# Patient Record
Sex: Male | Born: 1935
Health system: Southern US, Community
[De-identification: ages and names within clinical notes are randomized; demographics above are authoritative.]

## PROBLEM LIST (undated history)

## (undated) DIAGNOSIS — M545 Low back pain, unspecified: Secondary | ICD-10-CM

## (undated) DIAGNOSIS — M48061 Spinal stenosis, lumbar region without neurogenic claudication: Secondary | ICD-10-CM

## (undated) DIAGNOSIS — M5126 Other intervertebral disc displacement, lumbar region: Secondary | ICD-10-CM

## (undated) DIAGNOSIS — K449 Diaphragmatic hernia without obstruction or gangrene: Secondary | ICD-10-CM

## (undated) DIAGNOSIS — C259 Malignant neoplasm of pancreas, unspecified: Secondary | ICD-10-CM

## (undated) DIAGNOSIS — D649 Anemia, unspecified: Secondary | ICD-10-CM

## (undated) DIAGNOSIS — Z86010 Personal history of colonic polyps: Secondary | ICD-10-CM

## (undated) DIAGNOSIS — R531 Weakness: Secondary | ICD-10-CM

## (undated) DIAGNOSIS — Z860101 Personal history of adenomatous and serrated colon polyps: Secondary | ICD-10-CM

## (undated) DIAGNOSIS — M1990 Unspecified osteoarthritis, unspecified site: Secondary | ICD-10-CM

## (undated) DIAGNOSIS — M51369 Other intervertebral disc degeneration, lumbar region without mention of lumbar back pain or lower extremity pain: Secondary | ICD-10-CM

## (undated) DIAGNOSIS — R600 Localized edema: Secondary | ICD-10-CM

## (undated) DIAGNOSIS — K219 Gastro-esophageal reflux disease without esophagitis: Secondary | ICD-10-CM

## (undated) DIAGNOSIS — C3490 Malignant neoplasm of unspecified part of unspecified bronchus or lung: Secondary | ICD-10-CM

## (undated) DIAGNOSIS — Z5111 Encounter for antineoplastic chemotherapy: Secondary | ICD-10-CM

## (undated) DIAGNOSIS — N401 Enlarged prostate with lower urinary tract symptoms: Secondary | ICD-10-CM

## (undated) DIAGNOSIS — M5136 Other intervertebral disc degeneration, lumbar region: Secondary | ICD-10-CM

## (undated) DIAGNOSIS — R7303 Prediabetes: Secondary | ICD-10-CM

## (undated) DIAGNOSIS — I10 Essential (primary) hypertension: Secondary | ICD-10-CM

## (undated) DIAGNOSIS — Z9189 Other specified personal risk factors, not elsewhere classified: Secondary | ICD-10-CM

## (undated) DIAGNOSIS — R5383 Other fatigue: Secondary | ICD-10-CM

## (undated) DIAGNOSIS — K5790 Diverticulosis of intestine, part unspecified, without perforation or abscess without bleeding: Secondary | ICD-10-CM

## (undated) DIAGNOSIS — Z974 Presence of external hearing-aid: Secondary | ICD-10-CM

## (undated) DIAGNOSIS — K5710 Diverticulosis of small intestine without perforation or abscess without bleeding: Secondary | ICD-10-CM

## (undated) DIAGNOSIS — G8929 Other chronic pain: Secondary | ICD-10-CM

## (undated) HISTORY — DX: Gastro-esophageal reflux disease without esophagitis: K21.9

## (undated) HISTORY — DX: Malignant neoplasm of unspecified part of unspecified bronchus or lung: C34.90

## (undated) HISTORY — PX: LAPAROSCOPIC CHOLECYSTECTOMY: SUR755

## (undated) HISTORY — DX: Diaphragmatic hernia without obstruction or gangrene: K44.9

## (undated) HISTORY — DX: Diverticulosis of intestine, part unspecified, without perforation or abscess without bleeding: K57.90

## (undated) HISTORY — DX: Diverticulosis of small intestine without perforation or abscess without bleeding: K57.10

## (undated) HISTORY — DX: Encounter for antineoplastic chemotherapy: Z51.11

## (undated) HISTORY — PX: SPLENECTOMY: SUR1306

## (undated) HISTORY — DX: Unspecified osteoarthritis, unspecified site: M19.90

## (undated) HISTORY — PX: INGUINAL HERNIA REPAIR: SUR1180

---

## 1986-08-14 HISTORY — PX: TRANSURETHRAL RESECTION OF PROSTATE: SHX73

## 2000-10-10 ENCOUNTER — Encounter (INDEPENDENT_AMBULATORY_CARE_PROVIDER_SITE_OTHER): Payer: Self-pay | Admitting: *Deleted

## 2000-10-10 ENCOUNTER — Encounter: Payer: Self-pay | Admitting: Emergency Medicine

## 2000-10-11 ENCOUNTER — Encounter: Payer: Self-pay | Admitting: Internal Medicine

## 2000-10-11 ENCOUNTER — Inpatient Hospital Stay (HOSPITAL_COMMUNITY): Admission: EM | Admit: 2000-10-11 | Discharge: 2000-10-17 | Payer: Self-pay | Admitting: Emergency Medicine

## 2002-08-14 HISTORY — PX: CATARACT EXTRACTION W/ INTRAOCULAR LENS  IMPLANT, BILATERAL: SHX1307

## 2003-04-27 ENCOUNTER — Encounter: Payer: Self-pay | Admitting: Internal Medicine

## 2003-04-27 ENCOUNTER — Ambulatory Visit (HOSPITAL_COMMUNITY): Admission: RE | Admit: 2003-04-27 | Discharge: 2003-04-27 | Payer: Self-pay | Admitting: Internal Medicine

## 2003-05-26 ENCOUNTER — Encounter: Payer: Self-pay | Admitting: Gastroenterology

## 2003-06-04 ENCOUNTER — Encounter: Payer: Self-pay | Admitting: Gastroenterology

## 2003-06-04 ENCOUNTER — Ambulatory Visit (HOSPITAL_COMMUNITY): Admission: RE | Admit: 2003-06-04 | Discharge: 2003-06-04 | Payer: Self-pay | Admitting: Gastroenterology

## 2003-06-09 ENCOUNTER — Ambulatory Visit (HOSPITAL_COMMUNITY): Admission: RE | Admit: 2003-06-09 | Discharge: 2003-06-09 | Payer: Self-pay | Admitting: Gastroenterology

## 2003-07-02 ENCOUNTER — Encounter (INDEPENDENT_AMBULATORY_CARE_PROVIDER_SITE_OTHER): Payer: Self-pay | Admitting: *Deleted

## 2003-07-02 ENCOUNTER — Encounter (INDEPENDENT_AMBULATORY_CARE_PROVIDER_SITE_OTHER): Payer: Self-pay | Admitting: Specialist

## 2003-07-02 ENCOUNTER — Inpatient Hospital Stay (HOSPITAL_COMMUNITY): Admission: RE | Admit: 2003-07-02 | Discharge: 2003-07-04 | Payer: Self-pay | Admitting: Surgery

## 2005-01-29 ENCOUNTER — Emergency Department (HOSPITAL_COMMUNITY): Admission: EM | Admit: 2005-01-29 | Discharge: 2005-01-29 | Payer: Self-pay | Admitting: Emergency Medicine

## 2005-05-26 ENCOUNTER — Ambulatory Visit: Payer: Self-pay | Admitting: Oncology

## 2005-08-13 ENCOUNTER — Emergency Department (HOSPITAL_COMMUNITY): Admission: EM | Admit: 2005-08-13 | Discharge: 2005-08-13 | Payer: Self-pay | Admitting: Emergency Medicine

## 2005-10-11 ENCOUNTER — Ambulatory Visit: Payer: Self-pay | Admitting: Oncology

## 2008-04-23 DIAGNOSIS — Q8909 Congenital malformations of spleen: Secondary | ICD-10-CM

## 2008-04-23 DIAGNOSIS — Z860100 Personal history of colon polyps, unspecified: Secondary | ICD-10-CM | POA: Insufficient documentation

## 2008-04-23 DIAGNOSIS — R350 Frequency of micturition: Secondary | ICD-10-CM

## 2008-04-23 DIAGNOSIS — M899 Disorder of bone, unspecified: Secondary | ICD-10-CM | POA: Insufficient documentation

## 2008-04-23 DIAGNOSIS — M47817 Spondylosis without myelopathy or radiculopathy, lumbosacral region: Secondary | ICD-10-CM | POA: Insufficient documentation

## 2008-04-23 DIAGNOSIS — D649 Anemia, unspecified: Secondary | ICD-10-CM | POA: Insufficient documentation

## 2008-04-23 DIAGNOSIS — M949 Disorder of cartilage, unspecified: Secondary | ICD-10-CM

## 2008-04-23 DIAGNOSIS — D473 Essential (hemorrhagic) thrombocythemia: Secondary | ICD-10-CM | POA: Insufficient documentation

## 2008-04-23 DIAGNOSIS — IMO0002 Reserved for concepts with insufficient information to code with codable children: Secondary | ICD-10-CM

## 2008-04-27 ENCOUNTER — Ambulatory Visit: Payer: Self-pay | Admitting: Gastroenterology

## 2008-04-27 DIAGNOSIS — K219 Gastro-esophageal reflux disease without esophagitis: Secondary | ICD-10-CM

## 2008-05-29 ENCOUNTER — Ambulatory Visit: Payer: Self-pay | Admitting: Gastroenterology

## 2008-06-01 ENCOUNTER — Telehealth: Payer: Self-pay | Admitting: Gastroenterology

## 2008-06-17 ENCOUNTER — Encounter: Payer: Self-pay | Admitting: Gastroenterology

## 2008-06-17 ENCOUNTER — Ambulatory Visit: Payer: Self-pay | Admitting: Gastroenterology

## 2008-06-18 ENCOUNTER — Encounter: Payer: Self-pay | Admitting: Gastroenterology

## 2008-06-23 ENCOUNTER — Telehealth: Payer: Self-pay | Admitting: Gastroenterology

## 2010-09-06 ENCOUNTER — Encounter: Payer: Self-pay | Admitting: Gastroenterology

## 2010-09-11 ENCOUNTER — Encounter: Payer: Self-pay | Admitting: Gastroenterology

## 2010-09-14 ENCOUNTER — Encounter: Payer: Self-pay | Admitting: Gastroenterology

## 2010-09-16 ENCOUNTER — Encounter: Payer: Self-pay | Admitting: Gastroenterology

## 2010-09-21 ENCOUNTER — Encounter: Payer: Self-pay | Admitting: Gastroenterology

## 2010-09-22 ENCOUNTER — Encounter: Payer: Self-pay | Admitting: Gastroenterology

## 2010-10-06 ENCOUNTER — Ambulatory Visit: Payer: Self-pay | Admitting: Gastroenterology

## 2010-10-13 ENCOUNTER — Encounter: Payer: Self-pay | Admitting: Gastroenterology

## 2010-10-13 ENCOUNTER — Ambulatory Visit (INDEPENDENT_AMBULATORY_CARE_PROVIDER_SITE_OTHER): Payer: Medicare Other | Admitting: Gastroenterology

## 2010-10-13 DIAGNOSIS — R197 Diarrhea, unspecified: Secondary | ICD-10-CM

## 2010-10-19 ENCOUNTER — Telehealth: Payer: Self-pay | Admitting: Gastroenterology

## 2010-10-20 NOTE — Assessment & Plan Note (Signed)
Summary: Persistant loose stools   History of Present Illness Visit Type: Follow-up Visit Primary GI MD: Elie Goody MD [facility] Primary Provider: Gregery Na Requesting Provider: Gregery Na Chief Complaint: Loose stools up to four times a day History of Present Illness:   Mr. Brandun has worsening diarrhea for a few weeks, WBC=23 and fevers and was hospitalized at [facility]. Records from PCP office and hospitalization reviewed. All stool studies were negative including fecal leukocytes. CT scan showed a pericardial effusion and a kidney stone. ESR=45. He was treated with a 7 day course of Levaquin and his diarrhea has returned to his baseline of 2-4 loose stools per day. He feels well. Colonoscopy in 06/2008 showed polyps, diverticulosis and hemorrhoids.    GI Review of Systems    Reports acid reflux.      Denies abdominal pain, belching, bloating, chest pain, dysphagia with liquids, dysphagia with solids, heartburn, loss of appetite, nausea, vomiting, vomiting blood, weight loss, and  weight gain.      Reports change in bowel habits and  diarrhea.     Denies anal fissure, black tarry stools, constipation, diverticulosis, fecal incontinence, heme positive stool, hemorrhoids, irritable bowel syndrome, jaundice, light color stool, liver problems, rectal bleeding, and  rectal pain.   Current Medications (verified): 1)  Multivitamins   Tabs (Multiple Vitamin) .... One Tablet By Mouth Once Daily 2)  Saw Palmetto 160 Mg Caps (Saw Palmetto (Serenoa Repens)) .... One Capsule By Mouth Once Daily 3)  Flomax 0.4 Mg Xr24h-Cap (Tamsulosin Hcl) .... One Tablet By Mouth Once Daily 4)  Calcium Carbonate-Vitamin D 600-400 Mg-Unit  Tabs (Calcium Carbonate-Vitamin D) .... 2 Tablets By Mouth Once Daily 5)  Protonix 40 Mg Tbec (Pantoprazole Sodium) .... One Tablet By Mouth Once Daily 6)  Eql Fish Oil 1000 Mg Caps (Omega-3 Fatty Acids) .... Take As Directed 7)  Etodolac 400 Mg Tabs (Etodolac)  .... Hold 8)  Aspirin 81 Mg  Tabs (Aspirin) .... One Tablet By Mouth Once Daily 9)  Imodium A-D 2 Mg Tabs (Loperamide Hcl) .... As Needed 10)  Centrum Silver  Tabs (Multiple Vitamins-Minerals) .Marland Kitchen.. 1 By Mouth Once Daily 11)  Glucosamine Msm Complex  Tabs (Glucos-Msm-C-Mn-Ginger-Willow) .Marland Kitchen.. 1 By Mouth Two Times A Day 12)  Betamethasone Valerate 0.1 % Crea (Betamethasone Valerate) .... Apply Sparingly To Affected Areas Two Times A Day 13)  Ipratropium Bromide 0.03 % Soln (Ipratropium Bromide) .... Use 2 Sprays in Each Nostril 2 To 3 Times A Day 14)  Oxybutynin Chloride 5 Mg Tabs (Oxybutynin Chloride) .Marland Kitchen.. 1 By Mouth Once Daily 15)  Proctosol Hc 2.5 % Crea (Hydrocortisone) .... Apply To Affected Areas Two Times A Day 16)  Tamsulosin Hcl 0.4 Mg Caps (Tamsulosin Hcl)  Allergies (verified): 1)  ! Penicillin 2)  Pneumovax 23  Past History:  Past Medical History: Last updated: 10/11/2010 GERD Small HH Degenerative joint disease Rib fracture-2002 Diverticulosis Adenomatous Colon Polyps 05/2003 Large periampullary duodenal diverticulum Lumbar Neuritis L5-S1 Arthritis Pneumonia Urinary Tract Infection Hemorrhoids  Past Surgical History: Last updated: 04/27/2008 Hernia Surgery TURP Splenectomy-2002 Cataract surgery Cholecystectomy 06/2003  Family History: Family History of Diabetes: Father No FH of Colon Cancer:  Social History: Married Patient has never smoked.  Alcohol Use - no Vodca occasionally Daily Caffeine Use Illicit Drug Use - no  Review of Systems       The pertinent positives and negatives are noted as above and in the HPI. All other ROS were reviewed and were negative.   Vital  Signs:  Patient profile:   77 year old male Height:      68 inches Weight:      170 pounds BMI:     25.94 BSA:     1.91 Pulse rate:   64 / minute Pulse rhythm:   regular BP sitting:   122 / 76  (left arm)  Vitals Entered By: Merri Ray CMA Duncan Dull) (October 13, 2010 9:54  AM)  Physical Exam  General:  Well developed, well nourished, no acute distress. Head:  Normocephalic and atraumatic. Eyes:  PERRLA, no icterus. Ears:  Normal auditory acuity. Mouth:  No deformity or lesions, dentition normal. Neck:  Supple; no masses or thyromegaly. Lungs:  Clear throughout to auscultation. Heart:  Regular rate and rhythm; no murmurs, rubs,  or bruits. Abdomen:  Soft, nontender and nondistended. No masses, hepatosplenomegaly or hernias noted. Normal bowel sounds. Msk:  Symmetrical with no gross deformities. Normal posture. Pulses:  Normal pulses noted. Extremities:  No clubbing, cyanosis, edema or deformities noted. Neurologic:  Alert and  oriented x4;  grossly normal neurologically. Cervical Nodes:  No significant cervical adenopathy. Inguinal Nodes:  No significant inguinal adenopathy. Psych:  Alert and cooperative. Normal mood and affect.  Impression & Recommendations:  Problem # 1:  DIARRHEA (ICD-787.91) Possible infectious diarrhea now resolved. R/O lactose intolerance, celiac disease, IBS, IBD, microscopic colitis. Trial of probiotics and lactose avoidance. If diarrhea does not abate sent celiac panel, eval for pancreatic insufficency and consider colonoscopy.  Problem # 2:  GERD (ICD-530.81) Continue antireflux measures and Protonix.   Problem # 3:  ASPLENIA (ICD-759.0)  Problem # 4:  COLONIC POLYPS, ADENOMATOUS, HX OF (ICD-V12.72) Suveillance colonoscopy due in 06/2013.  Patient Instructions: 1)  Start Align one tablet by mouth once daily x 1 month and samples given to start.  2)  Lactose Free Diet handout given.  3)  Follow-up as needed if diarrhea does not resolve.  4)  Copy sent to : Gregery Na 5)  The medication list was reviewed and reconciled.  All changed / newly prescribed medications were explained.  A complete medication list was provided to the patient / caregiver.

## 2010-10-20 NOTE — Op Note (Signed)
Summary: Laparoscopic Cholecystectomy   NAME:  Sean Cooke, Sean Cooke                       ACCOUNT NO.:  [id number]   MEDICAL RECORD NO.:  [id number]                   PATIENT TYPE:  INP   LOCATION:  0353                                 FACILITY:  [facility]   PHYSICIAN:  Velora Heckler, M.D.                DATE OF BIRTH:  Apr 06, 1927   DATE OF PROCEDURE:  07/02/2003  DATE OF DISCHARGE:                                 OPERATIVE REPORT   PREOPERATIVE DIAGNOSES:  1. Chronic cholecystitis.  2. Cholelithiasis.  3. Rule out gallbladder carcinoma.   POSTOPERATIVE DIAGNOSES:  1. Chronic cholecystitis.  2. Cholelithiasis.  3. Extensive intra-abdominal adhesions.   PROCEDURE:  1. Laparoscopic cholecystectomy with intraoperative cholangiography.  2. Lysis of adhesions, upper abdomen.   SURGEON:  Velora Heckler, M.D.   ASSISTANT:  Sheppard Plumber. Earlene Plater, M.D.   ANESTHESIA:  General.   ESTIMATED BLOOD LOSS:  150 mL.   PREPARATION:  Betadine.   COMPLICATIONS:  None.   INDICATIONS:  The patient is a 77 year old white male, who presents at the  request of Dr. Claudette Head for evaluation of abnormal-appearing  gallbladder on radiographic studies.  The patient had presented with upper  abdominal discomfort and a frequent burping.  This was in August 2004.  He  was seen and evaluated by Dr. Russella Dar.  He underwent upper and lower  endoscopy.  He underwent CT scan of the abdomen and pelvis on June 04, 2003, at [facility].  This showed gallbladder with abnormal  thickening of the gallbladder wall, worrisome for gallbladder carcinoma.  The patient also had noted thickening in the adjacent hepatic flexure of the  colon, worrisome for direct invasion.  Ultrasound confirmed these findings.  The patient is now brought to the operating room for abdominal exploration  to rule out gallbladder carcinoma and for cholecystectomy.   DESCRIPTION OF PROCEDURE:  The procedure is done in OR #11 at the  [facility].  The patient is brought to the operating room and  placed in a supine position on the operating room table.  Following  administration of general anesthesia, the patient is prepped and draped in  the usual strict aseptic fashion.  After ascertaining that an adequate level  of anesthesia had been obtained, an infraumbilical incision is made in the  midline with a #15 blade.  Dissection is carried down to the fascia.  Fascia  is incised in the midline; the peritoneal cavity is entered cautiously.  An  0 Vicryl pursestring suture is placed in the fascia.  An Hasson cannula is  introduced under direct vision and secured with the pursestring suture.  The  abdomen is insufflated with carbon dioxide.  The laparoscope is introduced,  and the abdomen is explored.  There are extensive adhesions of small bowel  and transverse colon to the anterior abdominal wall.  There are extensive  adhesions of the  liver to the anterior abdominal wall and thoracic cavity.  There are extensive adhesions of the hepatic flexure of the colon to the  undersurface of the liver and the gallbladder.  Operating port is placed in  the subxiphoid position.  Adhesions are taken down with sharp dissection  with the scissors.  Blunt dissection is also employed.  Hemostasis is  obtained with the electrocautery.  After achieving adequate exposure of the  right upper quadrant, two additional operative ports are placed in the right  upper quadrant by Dr. Kendrick Ranch.  With gentle dissection, the transverse  colon is mobilized.  Extensive dissection is required for approximately one  hour in order to free the ascending and hepatic flexure of the colon from  the undersurface of the liver and the gallbladder.  This is quite difficult.  It did add to the length of the case by at least one hour.  However, it was  successfully employed, and the gallbladder was exposed.  A fifth operative  port is placed in  the mid abdomen in order to retract omentum and transverse  colon, allowing for exposure of the neck of the gallbladder and porta  hepatis.  Dissection is carried down to the neck of the gallbladder and  after some dissection, the cystic duct is identified.  A clip is placed at  the neck of the gallbladder.  The cystic duct is incised.  Clear gold bile  emanates from the cystic duct.  A Cook cholangiography catheter was  introduced through a stab wound in the right upper quadrant and inserted  into the cystic duct.  It secured with a Ligaclip.  Using C-arm fluoroscopy,  real-time cholangiography is performed.  There is free flow of contrast from  the cystic duct into a common bile duct.  There is free flow distally into  the duodenum.  There are no filling defects or evidence of obstruction.  There is reflux of contrast into the right and left hepatic ductal systems.  There is no evidence of stones.  Cook catheter is withdrawn from the  peritoneal cavity, and the cystic duct is triply clipped and divided.  Cystic artery is dissected out, doubly clipped, and divided.  The branches  of the cystic artery are doubly clipped and divided.  Gallbladder is then  excised from the gallbladder bed with some difficulty.  A portion of the  posterior wall of the gallbladder is left in situ due to extensive  inflammatory adhesion to the liver surface.  Gallstones are retrieved with  the stone scoop.  There are multiple yellow cholesterol stones measuring up  to 1 cm in size.  These are also submitted to pathology.  Gallbladder is  then excised from the gallbladder bed using the hook electrocautery for  hemostasis.  After the gallbladder is completely excised, it is placed into  an EndoCatch bag and removed from the peritoneal cavity through the  umbilical port.  It is submitted to pathology for review.  Dr. Jimmy Picket  did frozen section analysis of the very thickened portion of the gallbladder wall and  sees only a florid inflammatory reaction with no sign of  malignancy.  Good hemostasis is noted.  The abdomen is copiously irrigated.  A 19 [place] Blake drain is placed under the surface of the liver and brought  out through a lateral stab wound and secured to the skin with a 3-0 nylon  suture.  It was placed to bulb suction.  Fluid is aspirated from  the  abdomen, and good hemostasis is noted in the right upper quadrant.  Port are  removed under direct vision, and good hemostasis is noted at all port sites.  Then 0 Vicryl is tied securely at the umbilicus.  Pneumoperitoneum is  released.  Port sites are anesthetized with local anesthetic.  All sites are  closed with interrupted 4-0 Vicryl subcuticular sutures.  Wounds are washed  and dried, and Benzoin and Steri-Strips are applied.  Sterile gauze  dressings are applied.  The patient is awakened from anesthesia and brought  to the recovery room in stable condition.  The patient tolerated the  procedure well.                                               Velora Heckler, M.D.    TMG/MEDQ  D:  07/02/2003  T:  07/02/2003  Job:  [id number]   cc:   Venita Lick. Russella Dar, M.D. [facility]   Synetta Fail, MD  [facility] Surgical Pathology - STATUS: Final             By: Morrie Sheldon,       Perform Date: [id number] 00:00  Ordered By: Clancy Gourd,           Ordered Date: [phone] 10:45  Facility: [facility]                              Department: CPATH  Service Report Text  [facility]   [address] [place], [place] [address]   [phone]    REPORT OF SURGICAL PATHOLOGY    Case #: [id number]   Patient Name: Sean Cooke, Sean Cooke   PID: [id number]   Pathologist: Beulah Gandy. Luisa Hart, MD   DOB/Age 21-Apr-1927 (Age: 55) Gender: M   Date Taken: 07/02/2003   Date Received: 07/02/2003    FINAL DIAGNOSIS    ***MICROSCOPIC EXAMINATION AND DIAGNOSIS***    1. GALLBLADDER: ACUTE AND CHRONIC  CHOLECYSTITIS.   CHOLELITHIASIS.    2. CALCULI CONSISTENT WITH CLINICALLY STATED GALLBLADDER STONES.    kv   Date Reported: 07/03/2003 Beulah Gandy. Luisa Hart, MD   *** Electronically Signed Out By JDP ***    Clinical information   ? malignancy; gallbladder mass; gallstones (ms)    specimen(s) obtained   1: Gallbladder   2: Calculus, gallbladder stones    rapid intraoperative Diagnosis   1. GALLBLADDER, FROZEN SECTION: BENIGN. (JDP)    Gross Description   1. Size/?Intact: Partially opened, 8 x 3.5 cm in circumference   and includes a short segment of cystic duct traversed by a   metallic clip.   Serosal surface: Focally congested and shows a few hyperemic   adhesions.   Mucosa/Wall: Mucosa slight congestion and slight   cholesterolosis. In addition centrally there is a 0.5 cm   transmural defect and the surrounding mucosa shows greenish-red   discoloration and the subjacent wall shows focal yellow   discoloration. Wall varies from 0.2 to 0.5 cm in thickness and   adjacent to the transmural defect the wall is somewhat indurated,   whitish-gray and cuts with a fibrous consistency.   Contents: Within the bladder and specimen container are multiple   irregular yellow-green calculi that  vary from 0.2 to 0.8 cm and   measure 3 x 1.5 x 0.6 cm in aggregate.   Block Summary: Frozen sections are submitted in two cassettes   labeled A and B and additional sections are submitted for routine   histology in three cassettes labeled C, D, and E.    2. Received in formalin are calculi similar to those described   in Part 1 that measure up to 1 cm in greatest dimension and 3 x   1.5 x 0.6 cm in aggregate. No sections; gross diagnosis only.   (JBM:caf 07/02/03)    cf/

## 2010-10-25 NOTE — Letter (Signed)
Summary: [facility]  [facility]   Imported By: Sherian Rein 10/17/2010 08:44:37  _____________________________________________________________________  External Attachment:    Type:   Image     Comment:   External Document

## 2010-10-25 NOTE — Letter (Signed)
[facility]: [facility]   Imported By: Sherian Rein 10/17/2010 08:46:01  _____________________________________________________________________  External Attachment:    Type:   Image     Comment:   External Document

## 2010-10-25 NOTE — Progress Notes (Signed)
Summary: Speak with Nurse  Phone Note Call from Patient Call back at [phone]   Caller: Patient Call For: Dr. Russella Dar Reason for Call: Talk to Nurse Summary of Call: Patient is calling back after a week to speak with a nurse about the align that we put him on, he is going out of town for several days and wants to know if he should continue with the medication Initial call taken by: [place] Johnson,  October 19, 2010 8:55 AM  Follow-up for Phone Call        Patient advised that he should take Align for 1 month.   Follow-up by: Darcey Nora RN, CGRN,  October 19, 2010 9:30 AM

## 2010-11-14 ENCOUNTER — Telehealth: Payer: Self-pay | Admitting: Gastroenterology

## 2010-11-14 NOTE — Telephone Encounter (Signed)
LMOM for pt to call back.

## 2010-11-14 NOTE — Telephone Encounter (Signed)
Align and Lactinex OK together.

## 2010-11-14 NOTE — Telephone Encounter (Signed)
Informed pt of Dr Ardell Isaacs instructions to gradually add lactose into his diet and if diarrhea recurs, stop lactose again for a few weeks. Instructed pt to stay on the Align until he is sure he can tolerate the lactose. Pt then stated he is on Lactinex; is it ok to be on that with the Align? Thanks.

## 2010-11-14 NOTE — Telephone Encounter (Signed)
Can add lactose gradually into diet and if diarrhea recurs will need to stop lactose again for a few weeks. 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 If diarrhea returns after stopping Align he will need to resume Align for one month.

## 2010-11-14 NOTE — Telephone Encounter (Signed)
Pt seen by Dr Russella Dar on 10/13/10 visit for diarrhea. He was started on Align and a Lactose Free Diet. Today, pt stated his stools are still soft, but not as often.  1) Does he need to remain on Align?    2) Does he need to remain on the Lactose Free Diet- stated he loves cheese?

## 2010-11-14 NOTE — Telephone Encounter (Signed)
Notified pt Dr Russella Dar stated it's ok to take Align and Lactinex together; pt stated understanding.

## 2010-12-30 NOTE — Op Note (Signed)
NAME:  Sean Cooke, Sean Cooke                       ACCOUNT NO.:  [id number]   MEDICAL RECORD NO.:  [id number]                   PATIENT TYPE:  INP   LOCATION:  0353                                 FACILITY:  [facility]   PHYSICIAN:  Velora Heckler, M.D.                DATE OF BIRTH:  11-29-1926   DATE OF PROCEDURE:  07/02/2003  DATE OF DISCHARGE:                                 OPERATIVE REPORT   PREOPERATIVE DIAGNOSES:  1. Chronic cholecystitis.  2. Cholelithiasis.  3. Rule out gallbladder carcinoma.   POSTOPERATIVE DIAGNOSES:  1. Chronic cholecystitis.  2. Cholelithiasis.  3. Extensive intra-abdominal adhesions.   PROCEDURE:  1. Laparoscopic cholecystectomy with intraoperative cholangiography.  2. Lysis of adhesions, upper abdomen.   SURGEON:  Velora Heckler, M.D.   ASSISTANT:  Sheppard Plumber. Earlene Plater, M.D.   ANESTHESIA:  General.   ESTIMATED BLOOD LOSS:  150 mL.   PREPARATION:  Betadine.   COMPLICATIONS:  None.   INDICATIONS:  The patient is a 77 year old white male, who presents at the  request of Dr. Claudette Head for evaluation of abnormal-appearing  gallbladder on radiographic studies.  The patient had presented with upper  abdominal discomfort and a frequent burping.  This was in August 2004.  He  was seen and evaluated by Dr. Russella Dar.  He underwent upper and lower  endoscopy.  He underwent CT scan of the abdomen and pelvis on June 04, 2003, at [facility].  This showed gallbladder with abnormal  thickening of the gallbladder wall, worrisome for gallbladder carcinoma.  The patient also had noted thickening in the adjacent hepatic flexure of the  colon, worrisome for direct invasion.  Ultrasound confirmed these findings.  The patient is now brought to the operating room for abdominal exploration  to rule out gallbladder carcinoma and for cholecystectomy.   DESCRIPTION OF PROCEDURE:  The procedure is done in OR #11 at the [facility].  The  patient is brought to the operating room and  placed in a supine position on the operating room table.  Following  administration of general anesthesia, the patient is prepped and draped in  the usual strict aseptic fashion.  After ascertaining that an adequate level  of anesthesia had been obtained, an infraumbilical incision is made in the  midline with a #15 blade.  Dissection is carried down to the fascia.  Fascia  is incised in the midline; the peritoneal cavity is entered cautiously.  An  0 Vicryl pursestring suture is placed in the fascia.  An Hasson cannula is  introduced under direct vision and secured with the pursestring suture.  The  abdomen is insufflated with carbon dioxide.  The laparoscope is introduced,  and the abdomen is explored.  There are extensive adhesions of small bowel  and transverse colon to the anterior abdominal wall.  There are extensive  adhesions of the liver to the anterior abdominal  wall and thoracic cavity.  There are extensive adhesions of the hepatic flexure of the colon to the  undersurface of the liver and the gallbladder.  Operating port is placed in  the subxiphoid position.  Adhesions are taken down with sharp dissection  with the scissors.  Blunt dissection is also employed.  Hemostasis is  obtained with the electrocautery.  After achieving adequate exposure of the  right upper quadrant, two additional operative ports are placed in the right  upper quadrant by Dr. Kendrick Ranch.  With gentle dissection, the transverse  colon is mobilized.  Extensive dissection is required for approximately one  hour in order to free the ascending and hepatic flexure of the colon from  the undersurface of the liver and the gallbladder.  This is quite difficult.  It did add to the length of the case by at least one hour.  However, it was  successfully employed, and the gallbladder was exposed.  A fifth operative  port is placed in the mid abdomen in order to retract  omentum and transverse  colon, allowing for exposure of the neck of the gallbladder and porta  hepatis.  Dissection is carried down to the neck of the gallbladder and  after some dissection, the cystic duct is identified.  A clip is placed at  the neck of the gallbladder.  The cystic duct is incised.  Clear gold bile  emanates from the cystic duct.  A Cook cholangiography catheter was  introduced through a stab wound in the right upper quadrant and inserted  into the cystic duct.  It secured with a Ligaclip.  Using C-arm fluoroscopy,  real-time cholangiography is performed.  There is free flow of contrast from  the cystic duct into a common bile duct.  There is free flow distally into  the duodenum.  There are no filling defects or evidence of obstruction.  There is reflux of contrast into the right and left hepatic ductal systems.  There is no evidence of stones.  Cook catheter is withdrawn from the  peritoneal cavity, and the cystic duct is triply clipped and divided.  Cystic artery is dissected out, doubly clipped, and divided.  The branches  of the cystic artery are doubly clipped and divided.  Gallbladder is then  excised from the gallbladder bed with some difficulty.  A portion of the  posterior wall of the gallbladder is left in situ due to extensive  inflammatory adhesion to the liver surface.  Gallstones are retrieved with  the stone scoop.  There are multiple yellow cholesterol stones measuring up  to 1 cm in size.  These are also submitted to pathology.  Gallbladder is  then excised from the gallbladder bed using the hook electrocautery for  hemostasis.  After the gallbladder is completely excised, it is placed into  an EndoCatch bag and removed from the peritoneal cavity through the  umbilical port.  It is submitted to pathology for review.  Dr. Jimmy Picket  did frozen section analysis of the very thickened portion of the gallbladder wall and sees only a florid inflammatory  reaction with no sign of  malignancy.  Good hemostasis is noted.  The abdomen is copiously irrigated.  A 19 [place] Blake drain is placed under the surface of the liver and brought  out through a lateral stab wound and secured to the skin with a 3-0 nylon  suture.  It was placed to bulb suction.  Fluid is aspirated from the  abdomen, and good  hemostasis is noted in the right upper quadrant.  Port are  removed under direct vision, and good hemostasis is noted at all port sites.  Then 0 Vicryl is tied securely at the umbilicus.  Pneumoperitoneum is  released.  Port sites are anesthetized with local anesthetic.  All sites are  closed with interrupted 4-0 Vicryl subcuticular sutures.  Wounds are washed  and dried, and Benzoin and Steri-Strips are applied.  Sterile gauze  dressings are applied.  The patient is awakened from anesthesia and brought  to the recovery room in stable condition.  The patient tolerated the  procedure well.                                               Velora Heckler, M.D.    TMG/MEDQ  D:  07/02/2003  T:  07/02/2003  Job:  [id number]   cc:   Venita Lick. Russella Dar, M.D. [facility]   Synetta Fail, MD  [facility]

## 2010-12-30 NOTE — Op Note (Signed)
[place]. [facility]  Patient:    Sean Cooke, Sean Cooke                    MRN: [id number] Proc. Date: 10/11/00 Adm. Date:  [id number] Attending:  Doug Sou CC:         Jannette Fogo, M.D.             Trauma Office, [facility]                           Operative Report  PREOPERATIVE DIAGNOSES:  Splenic rupture, hemoperitoneum.  POSTOPERATIVE DIAGNOSES:  Splenic rupture, hemoperitoneum.  PROCEDURE:  Exploratory laparotomy with splenectomy and evacuation of hemoperitoneum.  SURGEON:  Velora Heckler, M.D.  ASSISTANT:  Chevis Pretty, M.D.  ANESTHESIA:  General.  ESTIMATED BLOOD LOSS:  Approximately 2.5 L blood found in the peritoneum.  COMPLICATIONS:  None.  PREPARATION:  Betadine.  INTRAOPERATIVE BLOOD PRODUCTS:  Three units packed red blood cells administered, 625 cc of packed cells from Cell Saver re-transfused.  INDICATIONS:  Patient is a 77 year old white male who sustained blunt trauma on the afternoon of February 27 while walking his dog.  The patient had taken a fall into a shallow stream bed lined with rocks.  He injured his left side as well as his right knee.  He was sent at the urgent care center on [address] and sent home.  Patient developed syncope during his evening meal at a [employer].  He was transported by EMS to [facility] Emergency Department.  He was seen by the medical teaching service and underwent workup for syncopal episode.  A significant drop in his hemoglobin was noted on repeat laboratory studies early this morning.  CT scan of the abdomen and pelvis was obtained and demonstrated hemoperitoneum with splenic laceration and probable active ongoing splenic bleeding.  Patient now comes to the operating room for splenectomy.  DESCRIPTION OF PROCEDURE:  The procedure was done in OR #2 at the [facility].  The patient is brought to the operating room, placed in a supine position on the  operating room table.  Following administration of general anesthesia, the patient is prepped and draped in the usual strict aseptic fashion.  After ascertaining an adequate level of anesthesia had been obtained, a midline abdominal incision is made with a #10 blade.  Dissection is carried down through the subcutaneous tissues.  Fascia is incised in the midline, the peritoneal cavity is entered cautiously.  There is gross hemoperitoneum.  Using the Cell Saver, this is evacuated from all four quadrants.  The left upper quadrant is addressed first.  Clot and fresh bleeding is evacuated, and the left upper quadrant is packed with 4 x 4 dry gauze sponges.  Remainder of the abdomen is then explored and fluid evacuated. Blood is evacuated from the pelvis.  Small bowel is run from the ligament of Treitz to the ileocecal valve and is normal.  Appendix is normal.  Colon is normal without injury.  Liver is normal without injury.  Gallbladder is present and normal.  The left upper quadrant is then re-addressed.  An orogastric tube is present within the stomach.  Thrombus is evacuated.  The lateral peritoneal attachments of the spleen are mobilized sharply with the Metzenbaum scissors.  Short gastric vessels are divided between [facility] clamps. The vessel to the inferior pole of the spleen from the splenic flexure is divided between [place]  clamps.  Hilar vessels are then addressed and divided between carefully-placed Kelly clamps, and the spleen is passed off the field. Essentially the entire lateral capsule of the spleen has been denuded from the splenic parenchyma.  There are gross lacerations through the splenic parenchyma.  Vessels at the hilum are suture ligated with 2-0 silk suture ligatures.  Short gastric vessels are ligated with 2-0 silk suture ligatures. Inferior pole vessel is suture ligated with 2-0 silk ligature.  A small superior vessel to the retroperitoneum is clipped with large ligaclips.   The left upper quadrant is irrigated and evacuated and packed with dry gauze sponges.  Good hemostasis was noted throughout the wound.  Packs are removed from the left upper quadrant.  Good hemostasis is noted.  There is no sign of ongoing bleeding.  Omentum is used to cover the operative site.  The abdomen is copiously irrigated with 3 L of warm normal saline, which is evacuated. The midline wound is closed with running #1 Novofil suture.  Subcutaneous tissues are irrigated and hemostasis obtained with the electrocautery.  Skin edges are reapproximated with stainless steel staples.  Cell Saver blood is re-transfused within the operating room.  The patient is awakened from anesthesia, extubated in the operating room, and brought to the recovery room in stable condition.  The patient tolerated the procedure well. DD:  10/11/00 TD:  10/11/00 Job: [id number]

## 2010-12-30 NOTE — Discharge Summary (Signed)
NAME:  Sean Cooke, Sean Cooke                       ACCOUNT NO.:  [id number]   MEDICAL RECORD NO.:  [id number]                   PATIENT TYPE:  INP   LOCATION:  0353                                 FACILITY:  [facility]   PHYSICIAN:  Velora Heckler, M.D.                DATE OF BIRTH:  27-Jun-1927   DATE OF ADMISSION:  07/02/2003  DATE OF DISCHARGE:  07/04/2003                                 DISCHARGE SUMMARY   REASON FOR ADMISSION:  Cholelithiasis, rule out gallbladder carcinoma.   HISTORY:  The patient is a 77 year old white male who developed abdominal  discomfort in August 2004.  He was seen by Dr. Claudette Head.  Upper and  lower endoscopy showed no significant findings.  CT scan of the abdomen and  pelvis performed in October 2004, at [facility] showed an abnormal  thickening of the gallbladder wall worrisome for cholecystitis or  gallbladder carcinoma.  Ultrasound was obtained on June 09, 2003.  This  was felt to be strongly suspicious for gallbladder carcinoma involving the  fundus of the gallbladder.  The patient now comes to surgery for  exploration.   HOSPITAL COURSE:  The patient was admitted to [facility] on  July 02, 2003, taken directly to the operating room.  The patient was  found to have chronic cholecystitis, cholelithiasis, and extensive  adhesions.  The patient required laparoscopic cholecystectomy with  intraoperative cholangiography and laparoscopic lysis of adhesions in the  upper abdomen.  No evidence of neoplasm was identified.  Postoperatively,  the patient did well.  He advanced to a clear liquid diet and then to a  regular diet on his first postoperative day.  He was prepared for discharge  home on the second postoperative day.  His Jackson-Pratt drain was removed  prior to discharge.   DISCHARGE PLANNING:  The patient is discharged home on July 04, 2003, in  good condition, tolerating a regular diet, ambulating independently.   DISCHARGE MEDICATIONS:  1. Vicodin as needed for pain.  2. Other medications as per usual.   FINAL DIAGNOSIS:  Final diagnosis on pathology showed chronic cholecystitis  and acute cholecystitis.  Cholelithiasis was noted.   CONDITION ON DISCHARGE:  Improved.                                               Velora Heckler, M.D.    TMG/MEDQ  D:  08/03/2003  T:  08/03/2003  Job:  [id number]   cc:   Venita Lick. Russella Dar, M.D. [facility]   Synetta Fail, M.D.  [facility] [place]

## 2010-12-30 NOTE — Discharge Summary (Signed)
[place]. [facility]  Patient:    Sean Cooke, Sean Cooke                    MRN: [id number] Adm. Date:  [id number] Disc. Date: [id number] Attending:  Trauma, Md Dictator:   Eugenia Pancoast, P.A.                           Discharge Summary  DATE OF BIRTH:  22-Oct-1926  ADMITTING PHYSICIAN:  Doug Sou, M.D.  CONSULTATIONS: 1. Velora Heckler, M.D. 2. Alvester Morin, M.D.  PROCEDURE:  October 11, 2000, exploratory laparotomy with splenectomy per Dr. Darnell Level.  HISTORY OF PRESENT ILLNESS:  This 77 year old gentleman was out walking his dog on October 11, 2000 in the morning when he fell in the creek and bruised his knee and his left chest and his left shoulder.  He also was seen during that day for routine followup by his urologist after he finally did go to Urgent Care and plains films done which were negative.  He denied any head injury.  On the evening of the day of admission, he ate a barbecue sandwich at a restaurant when he felt sudden onset of dizziness and fell to the floor.  This was witnessed by his wife and she noted a 30 second loss of consciousness.  There were no seizures but he did have urinary incontinence. He denied any chest pain.  He did have diaphoresis secondary to presyncopal episode when he was helped by EMS at the restaurant.  He was subsequently brought to the emergency room.  There he was seen and worked up.  Patient was seen and initially admitted for a syncopal episode workup.  He was seen and during workup during his admission he was noted to have a possible ruptured spleen.  Subsequently, Dr. Darnell Level was consulted.  He saw the patient and patient was noted to be orthostatic when he was in the emergency room.  CT of the abdomen was reviewed and noted to have splenic laceration with associated active bleeding and a large subcapsular hematoma and hemoperitoneum. Subsequently, he was taken to the operating room by  Dr. Gerrit Friends.  There he underwent an exploratory laparotomy and splenectomy.  Patient tolerated procedure well.  No intraoperative complication occurred.  Postoperatively, patient is satisfactory.  He had a normal postoperative recovery with no untoward events occurring during his stay.  He remained afebrile and vital signs were stable.  His incision was healed satisfactory during his stay.  He had NG tube initially and as the bowel sounds returned the NG tube was removed and he was started on diet.  He did have noted constipation but this was relieved on October 16, 2000 with a laxative.  He continued to do well the following morning.  Tolerating his diet satisfactorily at this time and subsequently prepared for discharge.  At time of discharge, the patient was given Vicodin one to two p.o. q.4-6h. p.r.n. for pain.  He was given 25 of these.  The staples were removed and Steri-Strips were applied prior to his discharge.  He was subsequently discharged home in satisfactory and stable condition.  FOLLOWUP:  He would follow up with the trauma clinic on Friday, October 26, 2000 at 1 p.m.  CONDITION ON DISCHARGE:  Patient subsequently discharged home in satisfactory and stable condition. DD:  10/17/00 TD:  10/17/00 Job: [id number]

## 2010-12-30 NOTE — Consult Note (Signed)
[place]. [facility]  Patient:    Sean Cooke, Sean Cooke                    MRN: [id number] Proc. Date: 10/11/00 Adm. Date:  [id number] Attending:  Doug Sou CC:         Medical Teaching Service B             [name], M.D.                          Consultation Report  REASON FOR CONSULTATION:  Splenic rupture.  REFERRING PHYSICIAN:  Medical teaching service.  BRIEF HISTORY:  The patient is a pleasant 77 year old white male brought into the emergency department after a syncopal episode on the evening of October 10, 2000.  The patient had been walking his dogs on the afternoon of October 10, 2000  He had taken a fall into a stream bed.  This was approximately 4 to 5 feet in depth and lined with large rocks.  He struck mainly his left shoulder and left ribs and right knee.  He went to the urgent care center on [address] and was evaluated with plain x-rays.  He was given a anti-inflammatory for discomfort.  The patient subsequently went out to dinner with his wife.  During dinner he experienced a syncopal episode and was brought by EMS to the emergency department for evaluation.  His work-up revealed a fall in hemoglobin from 11 to 9.  Subsequent CT scan of the abdomen was obtained and I have reviewed this with the radiologist.  It demonstrates a splenic laceration with a large perisplenic hematoma, hemoperitoneum with a moderate amount of blood around the liver and a small amount of blood in the pelvis.  There appears to be active bleeding from the spleen on CT scan. General surgery is now consulted for urgent laparotomy and splenectomy.  PAST MEDICAL HISTORY: 1. Essentially unremarkable with the exception of benign prostatic hypertrophy 2. Status post TURP by Dr. Larey Dresser. 3. Status post inguinal hernia repair years ago.  MEDICATIONS:  Occasional ibuprofen.  ALLERGIES:  PENICILLIN (causes rash).  SOCIAL HISTORY:  The patient is  accompanied by his wife.  He is retired.  He occasionally enjoys a cigar.  He uses alcohol on occasion.  He has no children.  FAMILY HISTORY:  Notable for coronary artery disease and diabetes.  REVIEW OF SYSTEMS/15 SYSTEM REVIEW:  Without significant other positives except as noted above.  PHYSICAL EXAMINATION:  GENERAL:  A 77 year old white male on a stretcher in the emergency department, mild to moderate discomfort with obvious slight alteration in mental status.  VITAL SIGNS:  Show temperature 97.0, pulse 82, blood pressure 85/49.  HEENT:  Normocephalic and atraumatic.  Sclerae are clear.  Mucous membranes are dry.  NECK:  Supple without masses.  There is tenderness over the left trapezius.  CHEST:  Clear to auscultation bilaterally but there is pain with deep inspiration on the left side.  There is no ecchymosis, contusion nor abrasion to the left chest wall or shoulder.  CARDIAC:  Regular rate and rhythm.  ABDOMEN:  Mildly distended.  There is diffuse tenderness maximal in the left upper quadrant.  Again, there are no signs of injury cutaneously with no abrasion, contusion or ecchymosis.  PENIS AND TESTICLES:  Normal.  EXTREMITIES:  Nontender without edema.  NEUROLOGIC:  The patient is alert and oriented x 2.  He has been medicated  with narcotics.  LABORATORY DATA:  Complete white count initially showed a hemoglobin of 11.8 and now has fallen to 9.4.  White count was 14,000.  Platelets 266,000. Electrolytes were normal.  Creatinine is normal at 1.1.  Liver function tests are normal.  RADIOGRAPHIC STUDIES:  Chest x-ray shows no active disease.  CT SCAN OF THE ABDOMEN AND PELVIS:  With results as noted above.  EKG:  Shows normal sinus rhythm without acute changes.  IMPRESSION:  Splenic rupture with active bleeding, hemoperitoneum, following fall and blunt trauma.  PLAN: 1. Admission to [facility]. 2. Urgently to operating room for exploratory  laparotomy and splenectomy. 3. Transfusions of packed red blood cells as needed. 4. Routine postoperative care. DD:  10/11/00 TD:  10/11/00 Job: [id number]

## 2011-11-29 ENCOUNTER — Encounter: Payer: Self-pay | Admitting: Gastroenterology

## 2011-12-05 ENCOUNTER — Encounter: Payer: Self-pay | Admitting: Gastroenterology

## 2011-12-05 ENCOUNTER — Ambulatory Visit (INDEPENDENT_AMBULATORY_CARE_PROVIDER_SITE_OTHER): Payer: Medicare Other | Admitting: Gastroenterology

## 2011-12-05 VITALS — BP 126/60 | HR 60 | Ht 68.0 in | Wt 170.1 lb

## 2011-12-05 DIAGNOSIS — Z860100 Personal history of colon polyps, unspecified: Secondary | ICD-10-CM

## 2011-12-05 DIAGNOSIS — R141 Gas pain: Secondary | ICD-10-CM

## 2011-12-05 DIAGNOSIS — R198 Other specified symptoms and signs involving the digestive system and abdomen: Secondary | ICD-10-CM

## 2011-12-05 DIAGNOSIS — R143 Flatulence: Secondary | ICD-10-CM

## 2011-12-05 NOTE — Progress Notes (Signed)
History of Present Illness: This is an 78 year old male who notes a recent change in bowel habits that has now resolved. He states he had 2-3 days of diarrhea with looser stools 3-4 times each day. This resolved a few weeks ago. He has noted mild constipation recently which is improved with the regular use of prunes. He has frequent problems with diarrhea related to lactose products for many years. He notes episodes of increased intestinal gas that responds to Gas-X. He has a prior history of adenomatous colon polyps initially diagnosed in 2004. Last colonoscopy was in December 2009. Denies weight loss, abdominal pain, change in stool caliber, melena, hematochezia, nausea, vomiting, dysphagia, reflux symptoms, chest pain.  Current Medications, Allergies, Past Medical History, Past Surgical History, Family History and Social History were reviewed in [employer] record.  Physical Exam: General: Well developed , well nourished, no acute distress Head: Normocephalic and atraumatic Eyes:  sclerae anicteric, EOMI Ears: Normal auditory acuity Mouth: No deformity or lesions Lungs: Clear throughout to auscultation Heart: Regular rate and rhythm; no murmurs, rubs or bruits Abdomen: Soft, non tender and non distended. No masses, hepatosplenomegaly or hernias noted. Normal Bowel sounds Rectal: Tight anal sphincter versus anal stricture that limited full digital examination, Hemoccult negative soft stool in the vault Musculoskeletal: Symmetrical with no gross deformities  Pulses:  Normal pulses noted Extremities: No clubbing, cyanosis, edema or deformities noted Neurological: Alert oriented x 4, grossly nonfocal Psychological:  Alert and cooperative. Normal mood and affect  Assessment and Recommendations:  1. Self-limited change in bowel habits and intestinal gas. Increase dietary fiber and water intake. Low gas diet. Gas-X as needed. Probiotics as needed.  2. Lactose intolerance.  Avoid lactose. Lactinex as needed.  3. Anal stricture versus tight anal sphincter. High fiber diet with increased water intake.   4. Personal history of adenomatous colon polyps. No plans for future surveillance or screening colonoscopies.

## 2011-12-05 NOTE — Patient Instructions (Signed)
You have been given a High Fiber diet and Low gas diet. Use Gas-X four times a day as needed for gas and bloating. cc: Synetta Fail, MD

## 2013-08-20 ENCOUNTER — Encounter: Payer: Self-pay | Admitting: Gastroenterology

## 2013-08-20 ENCOUNTER — Ambulatory Visit (INDEPENDENT_AMBULATORY_CARE_PROVIDER_SITE_OTHER): Payer: Medicare Other | Admitting: Gastroenterology

## 2013-08-20 VITALS — BP 132/78 | HR 62 | Ht 68.0 in | Wt 170.0 lb

## 2013-08-20 DIAGNOSIS — K5900 Constipation, unspecified: Secondary | ICD-10-CM

## 2013-08-20 DIAGNOSIS — E739 Lactose intolerance, unspecified: Secondary | ICD-10-CM | POA: Insufficient documentation

## 2013-08-20 DIAGNOSIS — Z86010 Personal history of colonic polyps: Secondary | ICD-10-CM

## 2013-08-20 NOTE — Progress Notes (Signed)
    History of Present Illness: This is an 80 year old man history of chronic constipation. I have evaluated him in the past for the same. Due to worsening problems with constipation his PCP started him on daily MiraLax and asked him to discontinue Lactinex and Align. If symptoms worsen. He was then started on lactulose and had diarrhea. He last underwent colonoscopy in November 2009 surveillance of adenomatous colon polyps. 1 adenomatous colon polyp was found along with sigmoid colon diverticulosis and internal hemorrhoids. We discontinued surveillance colonoscopies due to his age. Denies weight loss, abdominal pain, change in stool caliber, melena, hematochezia, nausea, vomiting, dysphagia, reflux symptoms, chest pain.  Current Medications, Allergies, Past Medical History, Past Surgical History, Family History and Social History were reviewed in [employer] record.  Physical Exam: General: Well developed , well nourished, no acute distress Head: Normocephalic and atraumatic Eyes:  sclerae anicteric, EOMI Ears: Normal auditory acuity Mouth: No deformity or lesions Lungs: Clear throughout to auscultation Heart: Regular rate and rhythm; no murmurs, rubs or bruits Abdomen: Soft, non tender and non distended. No masses, hepatosplenomegaly or hernias noted. Normal Bowel sounds Musculoskeletal: Symmetrical with no gross deformities  Pulses:  Normal pulses noted Extremities: No clubbing, cyanosis, edema or deformities noted Neurological: Alert oriented x 4, grossly nonfocal Psychological:  Alert and cooperative. Normal mood and affect  Assessment and Recommendations:  1. Chronic constipation, lactose intolerance, possible anal stenosis. Resume Lactinex and Align. Obtain stool Hemoccults. Continue lactulose at 5 g to 10 mg daily as needed for constipation. If his constipation is not adequately addressed or his stool is Hemoccult positive will plan to proceed with colonoscopy.

## 2013-08-20 NOTE — Patient Instructions (Signed)
Start back on your Lactinex daily and Align daily.   Take a 1/2 dose of your Lactulose daily.   Follow the instructions on the Hemoccult cards and mail them back to [place] when you are finished or you may take them directly to the lab in the basement of the [facility] building. We will call you with the results.   Thank you for choosing me and [facility].  Pricilla Riffle. Dagoberto Ligas., MD., Marval Regal

## 2013-09-03 ENCOUNTER — Telehealth: Payer: Self-pay | Admitting: Gastroenterology

## 2013-09-03 NOTE — Telephone Encounter (Signed)
Patient advised that we will call as soon as the results are available.

## 2013-09-04 ENCOUNTER — Other Ambulatory Visit (INDEPENDENT_AMBULATORY_CARE_PROVIDER_SITE_OTHER): Payer: Medicare Other

## 2013-09-04 DIAGNOSIS — K5900 Constipation, unspecified: Secondary | ICD-10-CM

## 2013-09-05 LAB — HEMOCCULT SLIDES (X 3 CARDS)
Fecal Occult Blood: NEGATIVE
OCCULT 1: NEGATIVE
OCCULT 2: NEGATIVE
OCCULT 3: NEGATIVE
OCCULT 4: NEGATIVE
OCCULT 5: NEGATIVE

## 2014-03-31 ENCOUNTER — Telehealth: Payer: Self-pay | Admitting: Gastroenterology

## 2014-03-31 ENCOUNTER — Other Ambulatory Visit: Payer: Self-pay | Admitting: Gastroenterology

## 2014-03-31 NOTE — Telephone Encounter (Signed)
Spoke with patient and he states he has not been taking his laxative regularly. His rx states 1-2 tablespoons daily. He will take 2 today then 1 daily until OV.

## 2014-03-31 NOTE — Telephone Encounter (Signed)
Spoke with patient and he states he started having stomach pain and belching on Sunday. Pain is above and below belly button and is a "sore feeling." He states he always is constipated but is not as bad as he has been. Reports usually has a hard stool in AM and loose stool after that. Denies nausea. He is taking Pantoprazole daily and Gaviscon PRN. Scheduled with Tye Savoy, NP on 04/03/14 at 3:00 PM.

## 2014-03-31 NOTE — Telephone Encounter (Signed)
Spoke with patient and gave him Dr. Stark's recommendation. 

## 2014-03-31 NOTE — Telephone Encounter (Signed)
He is probably more constipated than he thinks he is. Probably not completely evacuating his colon. This has been a problem for a while. He should increase his laxative dose while awaiting the appt.

## 2014-04-03 ENCOUNTER — Ambulatory Visit (INDEPENDENT_AMBULATORY_CARE_PROVIDER_SITE_OTHER): Payer: Medicare Other | Admitting: Nurse Practitioner

## 2014-04-03 ENCOUNTER — Encounter: Payer: Self-pay | Admitting: Nurse Practitioner

## 2014-04-03 ENCOUNTER — Other Ambulatory Visit (INDEPENDENT_AMBULATORY_CARE_PROVIDER_SITE_OTHER): Payer: Medicare Other

## 2014-04-03 VITALS — BP 160/70 | HR 64 | Ht 68.0 in | Wt 169.0 lb

## 2014-04-03 DIAGNOSIS — R1031 Right lower quadrant pain: Secondary | ICD-10-CM

## 2014-04-03 DIAGNOSIS — K5900 Constipation, unspecified: Secondary | ICD-10-CM

## 2014-04-03 DIAGNOSIS — R1032 Left lower quadrant pain: Secondary | ICD-10-CM

## 2014-04-03 NOTE — Patient Instructions (Signed)
Please go to the basement level to have your labs drawn.  We have given you a high fiber diet and constipation brochure  Take the Lactulose .( Chronulac ) 15 ml daily.  If the lower abdominal pain persists, call us back. We may need to prescribe antibiotics.

## 2014-04-03 NOTE — Progress Notes (Signed)
     History of Present Illness:  Mr. Sean Cooke is an 80 year old gentleman with a history of chronic constipation who is here today with complaints of recurrent constipation and abdominal pain. He last had a colonoscopy on 06/17/08 which showed diverticulosis, and colon polyps,  internal hemorrhoids. He states that at the end of last week he began to feel constipated and after several days without a bowel movement he took lactulose on Monday. Patient subsequently had several loose bowel movements. On Sunday and Monday earlier this week he had lower abdominal pain. The pain would come and go and initially was associated with an episode of chills though he has had no fever. No nausea / vomiting. The pain does not radiate to the back. He has continued to have some pain in the lower abdomen and suprapubic area which he rates as a 5 or 6 on pain scale.  He states the pain comes and goes and tends to be most prominent after defecation. He tries to get an adequate amount of fiber in his diet but admits to inadequate fluid intake however.  Current Medications, Allergies, Past Medical History, Past Surgical History, Family History and Social History were reviewed in [facility] record   Physical Exam: General: Pleasant, well developed , white male in no acute distress Head: Normocephalic and atraumatic Eyes:  sclerae anicteric, conjunctiva pink  Ears: Normal auditory acuity Lungs: Clear throughout to auscultation Heart: Regular rate and rhythm Abdomen: Soft, non distended, mild suprapubic and lower abd tenderness with no rebound or guarding. No masses, no hepatomegaly. Normal bowel sounds Rectal:deferred Musculoskeletal: Symmetrical with no gross deformities  Extremities: No edema  Neurological: Alert oriented x 4, grossly nonfocal Psychological:  Alert and cooperative. Normal mood and affect  Assessment and Recommendations:  An 80 year old gentleman with lower abdominal pain and  acute on chronic intermittent constipation.  His constipation has improved with lactulose but still having intermittent LLQ pain. Diverticulitis is possibility though not too tender on exam. CBC will be obtained. Depending on lab results and clinical course he may need to be treated empirically for diverticulitis. Patient will call office in a few days with a condition update. Continue fiber, increased water intake and lactulose as needed.

## 2014-04-04 LAB — CBC WITH DIFFERENTIAL/PLATELET
BASOS ABS: 0.1 10*3/uL (ref 0.0–0.1)
Basophils Relative: 1.1 % (ref 0.0–3.0)
EOS ABS: 0.3 10*3/uL (ref 0.0–0.7)
Eosinophils Relative: 2.6 % (ref 0.0–5.0)
HEMATOCRIT: 35.8 % — AB (ref 39.0–52.0)
HEMOGLOBIN: 12 g/dL — AB (ref 13.0–17.0)
LYMPHS ABS: 2 10*3/uL (ref 0.7–4.0)
Lymphocytes Relative: 18.2 % (ref 12.0–46.0)
MCHC: 33.5 g/dL (ref 30.0–36.0)
MCV: 94.1 fl (ref 78.0–100.0)
MONO ABS: 1.4 10*3/uL — AB (ref 0.1–1.0)
Monocytes Relative: 12.5 % — ABNORMAL HIGH (ref 3.0–12.0)
NEUTROS ABS: 7.2 10*3/uL (ref 1.4–7.7)
Neutrophils Relative %: 65.6 % (ref 43.0–77.0)
Platelets: 494 10*3/uL — ABNORMAL HIGH (ref 150.0–400.0)
RBC: 3.8 Mil/uL — ABNORMAL LOW (ref 4.22–5.81)
RDW: 12.9 % (ref 11.5–15.5)
WBC: 11 10*3/uL — ABNORMAL HIGH (ref 4.0–10.5)

## 2014-04-04 NOTE — Progress Notes (Signed)
Reviewed and agree with management plan.  Daejon Lich T. Lorance Pickeral, MD FACG 

## 2014-04-07 ENCOUNTER — Telehealth: Payer: Self-pay | Admitting: Nurse Practitioner

## 2014-04-14 ENCOUNTER — Other Ambulatory Visit (INDEPENDENT_AMBULATORY_CARE_PROVIDER_SITE_OTHER): Payer: Medicare Other

## 2014-04-14 ENCOUNTER — Encounter: Payer: Self-pay | Admitting: Gastroenterology

## 2014-04-14 ENCOUNTER — Ambulatory Visit (INDEPENDENT_AMBULATORY_CARE_PROVIDER_SITE_OTHER): Payer: Medicare Other | Admitting: Gastroenterology

## 2014-04-14 VITALS — BP 138/70 | HR 70 | Ht 68.0 in | Wt 168.4 lb

## 2014-04-14 DIAGNOSIS — K219 Gastro-esophageal reflux disease without esophagitis: Secondary | ICD-10-CM

## 2014-04-14 DIAGNOSIS — K5900 Constipation, unspecified: Secondary | ICD-10-CM

## 2014-04-14 DIAGNOSIS — D649 Anemia, unspecified: Secondary | ICD-10-CM

## 2014-04-14 LAB — FERRITIN: FERRITIN: 59.3 ng/mL (ref 22.0–322.0)

## 2014-04-14 LAB — IBC PANEL
Iron: 92 ug/dL (ref 42–165)
Saturation Ratios: 27.5 % (ref 20.0–50.0)
Transferrin: 238.8 mg/dL (ref 212.0–360.0)

## 2014-04-14 LAB — FOLATE: Folate: >24.8 ng/mL (ref >5.9)

## 2014-04-14 LAB — VITAMIN B12: VITAMIN B 12: 462 pg/mL (ref 211–911)

## 2014-04-14 NOTE — Patient Instructions (Signed)
Your physician has requested that you go to the basement for the following lab work before leaving today:Anemia panel.  Please take your Lactulose daily.  Return to Primary Care Physician for ongoing care.  Thank you for choosing me and [facility].  Pricilla Riffle. Dagoberto Ligas., MD., Marval Regal  cc: Tiana Loft, MD

## 2014-04-14 NOTE — Progress Notes (Signed)
    History of Present Illness: This is an 80 year old male with chronic constipation and left lower quadrant abdominal pain. He was seen by Tye Savoy PA-C on Apr 03, 2014 and by me on Aug 20, 2013 for the same. Has not had any abdominal pain recently as his constipation has been under better control using a high fiber diet and lactulose, although sometimes he notes slightly hard stools. He relates that he is not taking his lactulose daily as prescribed. His stool consistency tends to vary and has done so for a long period time however he is obsessed with his bowel habits. Stool Hemoccults were negative earlier this year. Recent CBC showed mild anemia with hemoglobin=12. He has occasional breakthrough reflux symptoms and wants to know whether it's okay to continue use Gaviscon as needed.  Current Medications, Allergies, Past Medical History, Past Surgical History, Family History and Social History were reviewed in [employer] record.  Physical Exam: General: Well developed , well nourished, elderly, no acute distress Head: Normocephalic and atraumatic Eyes:  sclerae anicteric, EOMI Ears: Normal auditory acuity Mouth: No deformity or lesions Lungs: Clear throughout to auscultation Heart: Regular rate and rhythm; no murmurs, rubs or bruits Abdomen: Soft, non tender and non distended. No masses, hepatosplenomegaly or hernias noted. Normal Bowel sounds Musculoskeletal: Symmetrical with no gross deformities  Pulses:  Normal pulses noted Extremities: No clubbing, cyanosis, edema or deformities noted Neurological: Alert oriented x 4, grossly nonfocal Psychological:  Alert and cooperative. Anxious.   Assessment and Recommendations:  1. Chronic constipation with occasional left lower quadrant pain. Long-term high fiber diet, alignment daily, lactulose once or twice daily titrated for adequate bowel movements. I have encouraged him to use lactulose very regularly to avoid  constipation which tends to lead to abdominal pain. He is again reassured that his bowel habits and stool consistency may very slightly with no cause for concern. He is released to the care of his primary physician for ongoing management.  2. Mild anemia. Check fe, TIBC, ferritin, B12, folate. Stool hemoccults negative earlier this year. Follow up with his PCP.   3. GERD. Continue pantoprazole 40 mg daily, standard antireflux measures and Gaviscon as needed. He is released to the care of his primary physician for ongoing management.  4. Lactose intolerance. Continue to minimize lactose intake and use Lactinex as needed. He is released to the care of his primary physician for ongoing management.  5. Internal hemorrhoids, occasionally symptomatic. Proctosol 2.5% each C. cream twice daily as needed. He is released to the care of his primary physician for ongoing management.

## 2014-04-16 ENCOUNTER — Telehealth: Payer: Self-pay | Admitting: Gastroenterology

## 2014-04-16 NOTE — Telephone Encounter (Signed)
Patient wanted to make sure he could  continue to take Lactinex and Align together. Told patient that was fine.

## 2014-05-12 NOTE — Telephone Encounter (Signed)
noted 

## 2014-10-04 ENCOUNTER — Emergency Department (HOSPITAL_COMMUNITY): Payer: PPO

## 2014-10-04 ENCOUNTER — Encounter (HOSPITAL_COMMUNITY): Payer: Self-pay

## 2014-10-04 ENCOUNTER — Inpatient Hospital Stay (HOSPITAL_COMMUNITY)
Admission: EM | Admit: 2014-10-04 | Discharge: 2014-10-07 | DRG: 194 | Disposition: A | Discharge status: Home / Self Care | Payer: PPO | Attending: Internal Medicine | Admitting: Internal Medicine

## 2014-10-04 DIAGNOSIS — K5900 Constipation, unspecified: Secondary | ICD-10-CM | POA: Diagnosis present

## 2014-10-04 DIAGNOSIS — K449 Diaphragmatic hernia without obstruction or gangrene: Secondary | ICD-10-CM | POA: Diagnosis present

## 2014-10-04 DIAGNOSIS — E871 Hypo-osmolality and hyponatremia: Secondary | ICD-10-CM | POA: Diagnosis present

## 2014-10-04 DIAGNOSIS — Z880 Allergy status to penicillin: Secondary | ICD-10-CM

## 2014-10-04 DIAGNOSIS — Q8909 Congenital malformations of spleen: Secondary | ICD-10-CM

## 2014-10-04 DIAGNOSIS — R197 Diarrhea, unspecified: Secondary | ICD-10-CM | POA: Diagnosis present

## 2014-10-04 DIAGNOSIS — I10 Essential (primary) hypertension: Secondary | ICD-10-CM | POA: Diagnosis present

## 2014-10-04 DIAGNOSIS — Z79899 Other long term (current) drug therapy: Secondary | ICD-10-CM

## 2014-10-04 DIAGNOSIS — Z87891 Personal history of nicotine dependence: Secondary | ICD-10-CM | POA: Diagnosis not present

## 2014-10-04 DIAGNOSIS — E860 Dehydration: Secondary | ICD-10-CM | POA: Diagnosis present

## 2014-10-04 DIAGNOSIS — K210 Gastro-esophageal reflux disease with esophagitis: Secondary | ICD-10-CM

## 2014-10-04 DIAGNOSIS — K219 Gastro-esophageal reflux disease without esophagitis: Secondary | ICD-10-CM | POA: Diagnosis present

## 2014-10-04 DIAGNOSIS — Z7982 Long term (current) use of aspirin: Secondary | ICD-10-CM

## 2014-10-04 DIAGNOSIS — M1990 Unspecified osteoarthritis, unspecified site: Secondary | ICD-10-CM | POA: Diagnosis present

## 2014-10-04 DIAGNOSIS — D649 Anemia, unspecified: Secondary | ICD-10-CM | POA: Diagnosis present

## 2014-10-04 DIAGNOSIS — R0602 Shortness of breath: Secondary | ICD-10-CM | POA: Diagnosis present

## 2014-10-04 DIAGNOSIS — D72829 Elevated white blood cell count, unspecified: Secondary | ICD-10-CM

## 2014-10-04 DIAGNOSIS — N400 Enlarged prostate without lower urinary tract symptoms: Secondary | ICD-10-CM | POA: Diagnosis present

## 2014-10-04 DIAGNOSIS — J189 Pneumonia, unspecified organism: Secondary | ICD-10-CM

## 2014-10-04 DIAGNOSIS — J159 Unspecified bacterial pneumonia: Principal | ICD-10-CM | POA: Diagnosis present

## 2014-10-04 DIAGNOSIS — Z9049 Acquired absence of other specified parts of digestive tract: Secondary | ICD-10-CM | POA: Diagnosis present

## 2014-10-04 DIAGNOSIS — D473 Essential (hemorrhagic) thrombocythemia: Secondary | ICD-10-CM | POA: Diagnosis present

## 2014-10-04 DIAGNOSIS — Q8901 Asplenia (congenital): Secondary | ICD-10-CM | POA: Diagnosis not present

## 2014-10-04 DIAGNOSIS — Z833 Family history of diabetes mellitus: Secondary | ICD-10-CM | POA: Diagnosis not present

## 2014-10-04 LAB — URINALYSIS, ROUTINE W REFLEX MICROSCOPIC
Bilirubin Urine: NEGATIVE
Glucose, UA: NEGATIVE mg/dL
Hgb urine dipstick: NEGATIVE
Ketones, ur: NEGATIVE mg/dL
LEUKOCYTES UA: NEGATIVE
Nitrite: NEGATIVE
PROTEIN: 30 mg/dL — AB
Specific Gravity, Urine: 1.021 (ref 1.005–1.030)
Urobilinogen, UA: 1 mg/dL (ref 0.0–1.0)
pH: 6 (ref 5.0–8.0)

## 2014-10-04 LAB — COMPREHENSIVE METABOLIC PANEL
ALK PHOS: 66 U/L (ref 39–117)
ALT: 15 U/L (ref 0–53)
ANION GAP: 7 (ref 5–15)
AST: 24 U/L (ref 0–37)
Albumin: 3.7 g/dL (ref 3.5–5.2)
BILIRUBIN TOTAL: 0.8 mg/dL (ref 0.3–1.2)
BUN: 13 mg/dL (ref 6–23)
CALCIUM: 9.1 mg/dL (ref 8.4–10.5)
CO2: 25 mmol/L (ref 19–32)
CREATININE: 0.61 mg/dL (ref 0.50–1.35)
Chloride: 94 mmol/L — ABNORMAL LOW (ref 96–112)
GFR calc Af Amer: >90 mL/min (ref >90)
GFR calc non Af Amer: 89 mL/min — ABNORMAL LOW (ref >90)
GLUCOSE: 156 mg/dL — AB (ref 70–99)
Potassium: 3.9 mmol/L (ref 3.5–5.1)
Sodium: 126 mmol/L — ABNORMAL LOW (ref 135–145)
Total Protein: 7.4 g/dL (ref 6.0–8.3)

## 2014-10-04 LAB — CBC WITH DIFFERENTIAL/PLATELET
Basophils Absolute: 0 10*3/uL (ref 0.0–0.1)
Basophils Relative: 0 % (ref 0–1)
EOS ABS: 0 10*3/uL (ref 0.0–0.7)
EOS PCT: 0 % (ref 0–5)
HEMATOCRIT: 32.2 % — AB (ref 39.0–52.0)
HEMOGLOBIN: 10.8 g/dL — AB (ref 13.0–17.0)
LYMPHS ABS: 2.1 10*3/uL (ref 0.7–4.0)
LYMPHS PCT: 5 % — AB (ref 12–46)
MCH: 30.6 pg (ref 26.0–34.0)
MCHC: 33.5 g/dL (ref 30.0–36.0)
MCV: 91.2 fL (ref 78.0–100.0)
Monocytes Absolute: 2.9 10*3/uL — ABNORMAL HIGH (ref 0.1–1.0)
Monocytes Relative: 7 % (ref 3–12)
Neutro Abs: 36.1 10*3/uL — ABNORMAL HIGH (ref 1.7–7.7)
Neutrophils Relative %: 88 % — ABNORMAL HIGH (ref 43–77)
Platelets: 508 10*3/uL — ABNORMAL HIGH (ref 150–400)
RBC: 3.53 MIL/uL — ABNORMAL LOW (ref 4.22–5.81)
RDW: 14 % (ref 11.5–15.5)
WBC: 41.1 10*3/uL — AB (ref 4.0–10.5)

## 2014-10-04 LAB — BRAIN NATRIURETIC PEPTIDE: B NATRIURETIC PEPTIDE 5: 107.6 pg/mL — AB (ref 0.0–100.0)

## 2014-10-04 LAB — TROPONIN I: Troponin I: <0.03 ng/mL (ref <0.031)

## 2014-10-04 LAB — EXPECTORATED SPUTUM ASSESSMENT W GRAM STAIN, RFLX TO RESP C

## 2014-10-04 LAB — URINE MICROSCOPIC-ADD ON

## 2014-10-04 LAB — I-STAT CG4 LACTIC ACID, ED: LACTIC ACID, VENOUS: 1.15 mmol/L (ref 0.5–2.0)

## 2014-10-04 MED ORDER — ONDANSETRON HCL 4 MG PO TABS: 4.0000 mg | ORAL_TABLET | Freq: Four times a day (QID) | ORAL | Status: DC | PRN

## 2014-10-04 MED ORDER — SODIUM CHLORIDE 0.9 % IV SOLN
INTRAVENOUS | Status: DC
  Administered 2014-10-04: 22:00:00 via INTRAVENOUS
  Administered 2014-10-04: 100 mL/h via INTRAVENOUS
  Administered 2014-10-05 – 2014-10-07 (×6): via INTRAVENOUS

## 2014-10-04 MED ORDER — ACETAMINOPHEN 325 MG PO TABS
650.0000 mg | ORAL_TABLET | Freq: Four times a day (QID) | ORAL | Status: DC | PRN
Start: 2014-10-04 — End: 2014-10-07
  Administered 2014-10-06: 650 mg via ORAL
  Filled 2014-10-04: qty 2

## 2014-10-04 MED ORDER — ONDANSETRON HCL 4 MG/2ML IJ SOLN: 4.0000 mg | Freq: Four times a day (QID) | INTRAMUSCULAR | Status: DC | PRN

## 2014-10-04 MED ORDER — SODIUM CHLORIDE 0.9 % IJ SOLN: 3.0000 mL | Freq: Two times a day (BID) | INTRAMUSCULAR | Status: DC

## 2014-10-04 MED ORDER — ASPIRIN EC 81 MG PO TBEC
81.0000 mg | DELAYED_RELEASE_TABLET | Freq: Every day | ORAL | Status: DC
  Administered 2014-10-04 – 2014-10-06 (×3): 81 mg via ORAL
  Filled 2014-10-04 (×3): qty 1

## 2014-10-04 MED ORDER — DEXTROSE 5 % IV SOLN
1.0000 g | INTRAVENOUS | Status: DC
  Administered 2014-10-04: 1 g via INTRAVENOUS
  Filled 2014-10-04: qty 10

## 2014-10-04 MED ORDER — MELOXICAM 7.5 MG PO TABS
7.5000 mg | ORAL_TABLET | Freq: Every day | ORAL | Status: DC
  Administered 2014-10-04 – 2014-10-07 (×4): 7.5 mg via ORAL
  Filled 2014-10-04 (×4): qty 1

## 2014-10-04 MED ORDER — OXYBUTYNIN CHLORIDE 5 MG PO TABS
5.0000 mg | ORAL_TABLET | Freq: Every day | ORAL | Status: DC
  Administered 2014-10-04 – 2014-10-07 (×4): 5 mg via ORAL
  Filled 2014-10-04 (×4): qty 1

## 2014-10-04 MED ORDER — HEPARIN SODIUM (PORCINE) 5000 UNIT/ML IJ SOLN
5000.0000 [IU] | Freq: Three times a day (TID) | INTRAMUSCULAR | Status: DC
  Administered 2014-10-04 – 2014-10-07 (×10): 5000 [IU] via SUBCUTANEOUS
  Filled 2014-10-04 (×10): qty 1

## 2014-10-04 MED ORDER — LOSARTAN POTASSIUM 50 MG PO TABS
50.0000 mg | ORAL_TABLET | Freq: Every day | ORAL | Status: DC
  Administered 2014-10-04 – 2014-10-07 (×4): 50 mg via ORAL
  Filled 2014-10-04 (×4): qty 1

## 2014-10-04 MED ORDER — ACETAMINOPHEN 650 MG RE SUPP: 650.0000 mg | Freq: Four times a day (QID) | RECTAL | Status: DC | PRN

## 2014-10-04 MED ORDER — DEXTROSE 5 % IV SOLN
500.0000 mg | INTRAVENOUS | Status: DC
  Filled 2014-10-04: qty 500

## 2014-10-04 MED ORDER — LEVOFLOXACIN IN D5W 750 MG/150ML IV SOLN
750.0000 mg | INTRAVENOUS | Status: DC
  Administered 2014-10-04 – 2014-10-07 (×4): 750 mg via INTRAVENOUS
  Filled 2014-10-04 (×4): qty 150

## 2014-10-04 MED ORDER — TAMSULOSIN HCL 0.4 MG PO CAPS
0.4000 mg | ORAL_CAPSULE | Freq: Every day | ORAL | Status: DC
  Administered 2014-10-04 – 2014-10-07 (×4): 0.4 mg via ORAL
  Filled 2014-10-04 (×4): qty 1

## 2014-10-04 MED ORDER — MORPHINE SULFATE 2 MG/ML IJ SOLN
2.0000 mg | INTRAMUSCULAR | Status: DC | PRN
  Administered 2014-10-06 – 2014-10-07 (×2): 2 mg via INTRAVENOUS
  Filled 2014-10-04 (×2): qty 1

## 2014-10-04 MED ORDER — PANTOPRAZOLE SODIUM 40 MG PO TBEC
40.0000 mg | DELAYED_RELEASE_TABLET | Freq: Every day | ORAL | Status: DC
  Administered 2014-10-04 – 2014-10-07 (×4): 40 mg via ORAL
  Filled 2014-10-04 (×4): qty 1

## 2014-10-04 MED ORDER — LACTINEX PO CHEW
1.0000 | CHEWABLE_TABLET | Freq: Two times a day (BID) | ORAL | Status: DC
  Administered 2014-10-04 – 2014-10-07 (×6): 1 via ORAL
  Filled 2014-10-04 (×8): qty 1

## 2014-10-04 NOTE — ED Notes (Signed)
MD at bedside. ADMITTING MD CHIU PRESENT TO ADMIT PT AND SPEAKING WITH WIFE

## 2014-10-04 NOTE — ED Notes (Signed)
Spoke with Sonia Baller CN @ 1000

## 2014-10-04 NOTE — ED Notes (Signed)
Pt c/o of shortness of breath and discomfort since Friday. C/o productive cough since 3am Saturday morning with severe sore throat. Copious amounts of secretions clear to yellow. Placed on 3 liters for support

## 2014-10-04 NOTE — ED Notes (Signed)
Per pt, cough with shortness of breath since Saturday.  Pt voice is gurgled.  Pt cough is productive.  Pt sates on ra - 85%  No hx of CHF.  No fever or hx of cancer

## 2014-10-04 NOTE — Progress Notes (Signed)
Md aware of not voided this shift. Bladder scan only showing 200cc. Pt voice no discomfort. No new orders

## 2014-10-04 NOTE — Progress Notes (Signed)
Bladder scanned 200 cc noted. Pt has not voided since admitted to room 1439. Voice no discomfort or pain.Will continue to monitor

## 2014-10-04 NOTE — Progress Notes (Signed)
Utilization Review Completed.Donne Anon T2/21/2016

## 2014-10-04 NOTE — H&P (Signed)
[facility] History and Physical  Sean Cooke UMP:[id number] DOB: 07-Feb-1927 DOA: 10/04/2014  Referring physician: Emergency Department PCP: [name], MD   Chief Complaint: Cough, SOB  HPI: Sean Cooke is a 81 y.o. male  With a hx of GERD, DJD, hiatal hernia who presents to the ed with complaints of worsening productive cough with sob for the past 3 days. In the ed, pt was found to be hypoxic with O2 sats of 85% on RA, improved to 95% on 2LNC. WBC was noted to be 41k with CXR findings suggestive of L sided PNA. Patient was started on rocephin and azithromycin and hospitalist consulted for admission.  Review of Systems:  Review of Systems  Constitutional: Negative for fever and chills.  HENT: Negative for tinnitus.   Eyes: Negative for blurred vision and double vision.  Respiratory: Positive for cough, sputum production and shortness of breath.   Cardiovascular: Negative for palpitations.  Gastrointestinal: Negative for nausea, vomiting and abdominal pain.  Genitourinary: Negative for flank pain.  Musculoskeletal: Negative for myalgias and falls.  Skin: Negative for rash.  Neurological: Negative for dizziness, focal weakness, loss of consciousness and headaches.  Psychiatric/Behavioral: Negative for depression and substance abuse.     Past Medical History  Diagnosis Date  . GERD (gastroesophageal reflux disease)   . Hiatal hernia   . Degenerative joint disease   . Rib fracture   . Diverticulosis   . Adenomatous colon polyp 05/2003  . Duodenal diverticulum   . Neuritis/radiculitis due to displacement of lumbar intervertebral disc   . Arthritis   . Pneumonia   . Hemorrhoids    Past Surgical History  Procedure Laterality Date  . Inguinal hernia repair    . Transurethral resection of prostate    . Splenectomy  2002  . Cataract extraction      bilateral  . Cholecystectomy  06/2003   Social History:  reports that he quit smoking about 26 years  ago. His smoking use included Cigarettes and Pipe. He has never used smokeless tobacco. He reports that he drinks alcohol. He reports that he does not use illicit drugs.  Allergies  Allergen Reactions  . Lyrica [Pregabalin]     unknown  . Penicillins     REACTION: Rash  . Pneumococcal Vaccine Polyvalent     REACTION: Swelling    Family History  Problem Relation Age of Onset  . Diabetes Father   . Colon cancer Neg Hx     do not leave blank  Prior to Admission medications   Medication Sig Start Date End Date Taking? Authorizing Provider  acetaminophen (TYLENOL) 650 MG CR tablet Take 650 mg by mouth as needed for pain.    Yes Historical Provider, MD  aspirin 81 MG tablet Take 81 mg by mouth at bedtime.    Yes Historical Provider, MD  azithromycin (ZITHROMAX) 250 MG tablet Take 250-500 mg by mouth daily. 500 mg on day 1 then 250 mg on day 2-5   Yes Historical Provider, MD  Calcium Carbonate-Vitamin D (CALCIUM + D PO) Take 2 capsules by mouth daily.   Yes Historical Provider, MD  denosumab (PROLIA) 60 MG/ML SOLN injection Inject 60 mg into the skin every 6 (six) months. Administer in upper arm, thigh, or abdomen   Yes Historical Provider, MD  dextromethorphan-guaiFENesin (ROBITUSSIN-DM) 10-100 MG/5ML liquid Take 10 mLs by mouth every 4 (four) hours as needed for cough.   Yes Historical Provider, MD  Diphenhyd-Hydrocort-Nystatin (FIRST-DUKES MOUTHWASH MT) Use as directed 10  mLs in the mouth or throat 3 (three) times daily.   Yes Historical Provider, MD  Glucos-MSM-C-Mn-Ginger-Willow (GLUCOSAMINE MSM COMPLEX) TABS Take 2 tablets by mouth daily.   Yes Historical Provider, MD  hydrocortisone (PROCTOSOL HC) 2.5 % rectal cream Place 1 application rectally 2 (two) times daily.   Yes Historical Provider, MD  lactobacillus acidophilus & bulgar (LACTINEX) chewable tablet Chew 1 tablet by mouth 2 (two) times daily. Store in [place]   Yes Historical Provider, MD  lactulose (CHRONULAC) 10 GM/15ML  solution TAKE 15 MLS ONCE DAILY AS DIRECTED 03/31/14  Yes Ladene Artist, MD  losartan (COZAAR) 50 MG tablet Take 50 mg by mouth daily.   Yes Historical Provider, MD  meloxicam (MOBIC) 7.5 MG tablet Take 7.5 mg by mouth daily.   Yes Historical Provider, MD  Multiple Vitamin (MULTIVITAMIN) tablet Take 1 tablet by mouth daily.   Yes Historical Provider, MD  Omega-3 Fatty Acids (FISH OIL) 1000 MG CAPS Take 1,000 mg by mouth daily.    Yes Historical Provider, MD  oxybutynin (DITROPAN) 5 MG tablet Take 5 mg by mouth daily.   Yes Historical Provider, MD  pantoprazole (PROTONIX) 40 MG tablet Take 40 mg by mouth daily.   Yes Historical Provider, MD  Probiotic Product (ALIGN) 4 MG CAPS Take 1 capsule by mouth daily.   Yes Historical Provider, MD  saw palmetto 160 MG capsule Take 160 mg by mouth daily.   Yes Historical Provider, MD  Tamsulosin HCl (FLOMAX) 0.4 MG CAPS Take 0.4 mg by mouth daily.   Yes Historical Provider, MD   Physical Exam: Filed Vitals:   10/04/14 0801 10/04/14 0837  BP: 157/61   Pulse: 97   Temp: 98.7 F (37.1 C)   TempSrc: Oral   Resp: 24   SpO2: 85% 95%    Wt Readings from Last 3 Encounters:  04/14/14 76.386 kg (168 lb 6.4 oz)  04/03/14 76.658 kg (169 lb)  08/20/13 77.111 kg (170 lb)    General:  Appears calm and comfortable Eyes: PERRL, normal lids, irises & conjunctiva ENT: grossly normal hearing, lips & tongue Neck: no LAD, masses or thyromegaly Cardiovascular: RRR, no m/r/g. No LE edema. Telemetry: SR, no arrhythmias  Respiratory:Normal resp effort, coarse breath sounds B, no wheezing. Abdomen: soft, ntnd Skin: no rash or induration seen on limited exam Musculoskeletal: grossly normal tone BUE/BLE Psychiatric: grossly normal mood and affect, speech fluent and appropriate Neurologic: grossly non-focal.          Labs on Admission:  Basic Metabolic Panel:  Recent Labs Lab 10/04/14 0834  NA 126*  K 3.9  CL 94*  CO2 25  GLUCOSE 156*  BUN 13    CREATININE 0.61  CALCIUM 9.1   Liver Function Tests:  Recent Labs Lab 10/04/14 0834  AST 24  ALT 15  ALKPHOS 66  BILITOT 0.8  PROT 7.4  ALBUMIN 3.7   No results for input(s): LIPASE, AMYLASE in the last 168 hours. No results for input(s): AMMONIA in the last 168 hours. CBC:  Recent Labs Lab 10/04/14 0834  WBC 41.1*  NEUTROABS 36.1*  HGB 10.8*  HCT 32.2*  MCV 91.2  PLT 508*   Cardiac Enzymes:  Recent Labs Lab 10/04/14 0834  TROPONINI <0.03    BNP (last 3 results)  Recent Labs  10/04/14 0831  BNP 107.6*    ProBNP (last 3 results) No results for input(s): PROBNP in the last 8760 hours.  CBG: No results for input(s): GLUCAP in the last  168 hours.  Radiological Exams on Admission: Dg Chest 2 View  10/04/2014   CLINICAL DATA:  Sore throat and productive cough for 2 days  EXAM: CHEST  2 VIEW  COMPARISON:  03/11/2013  FINDINGS: The heart size appears normal. Calcified atherosclerotic disease involves the abdominal aorta. Lung volumes are low and there is atelectasis within both lung bases left greater night. Airspace opacity in the left base is best seen on the lateral radiograph.  IMPRESSION: 1. Left base airspace opacity consistent with pneumonia. 2. Bibasilar atelectasis.   Electronically Signed   By: Kerby Moors M.D.   On: 10/04/2014 09:15    Assessment/Plan Active Problems:   GERD   Community acquired bacterial pneumonia   Leukocytosis   Essential hypertension   BPH (benign prostatic hyperplasia)   1. CAP 1. No recent hospitalizations in the past 3 months 2. Afebrile with radographic evidence of PNA 3. Leukocytosis of 41K 4. Currently on min O2 support and stable 5. Will cont on levaquin secondary to PCN allergy 6. Admit to med-tele 2. Leukocytosis 1. Likely secondary to acute infection 2. Follow CBC 3. Afebrile 3. GERD 1. Cont PPI per home regimen 4. DJD 1. Stable currently 5. BPH 1. Cont flomax 6. HTN 1. BP stable 2. Cont home  meds 7. DVT prophylaxis 1. Heparin subQ  Code Status: Full, confirmed with patient in presence of pt's wife (must indicate code status--if unknown or must be presumed, indicate so) DVT Prophylaxis: Family Communication: Pt in room, wife at bedside (indicate person spoken with, if applicable, with phone number if by telephone) Disposition Plan: Pending (indicate anticipated LOS)   [facility], [facility] Pager [phone]

## 2014-10-04 NOTE — ED Provider Notes (Signed)
CSN: [id number]     Arrival date & time 10/04/14  9833 History   First MD Initiated Contact with Patient 10/04/14 [phone]     Chief Complaint  Patient presents with  . Shortness of Breath  . Cough     (Consider location/radiation/quality/duration/timing/severity/associated sxs/prior Treatment) HPI Comments: Patient here with 2 days of sore throat and cough which is been productive of yellow and white sputum. Denies any anginal type chest pain. Has not endorsed any lower extremity edema. No reported fever, vomiting, diarrhea. Sore throat characterizes scratchy and treated with over-the-counter medications without relief. No prior history of CHF. But does note dyspnea on exertion without orthopnea. Symptoms have been progressively worsening.  Patient is a 81 y.o. male presenting with shortness of breath and cough. The history is provided by the patient and the spouse.  Shortness of Breath Associated symptoms: cough   Cough Associated symptoms: shortness of breath     Past Medical History  Diagnosis Date  . GERD (gastroesophageal reflux disease)   . Hiatal hernia   . Degenerative joint disease   . Rib fracture   . Diverticulosis   . Adenomatous colon polyp 05/2003  . Duodenal diverticulum   . Neuritis/radiculitis due to displacement of lumbar intervertebral disc   . Arthritis   . Pneumonia   . Hemorrhoids    Past Surgical History  Procedure Laterality Date  . Inguinal hernia repair    . Transurethral resection of prostate    . Splenectomy  2002  . Cataract extraction      bilateral  . Cholecystectomy  06/2003   Family History  Problem Relation Age of Onset  . Diabetes Father   . Colon cancer Neg Hx    History  Substance Use Topics  . Smoking status: Former Smoker    Types: Cigarettes, Pipe    Quit date: 08/14/1988  . Smokeless tobacco: Never Used  . Alcohol Use: Yes     Comment: rarely wine    Review of Systems  Respiratory: Positive for cough and shortness of  breath.   All other systems reviewed and are negative.     Allergies  Penicillins and Pneumococcal vaccine polyvalent  Home Medications   Prior to Admission medications   Medication Sig Start Date End Date Taking? Authorizing Provider  acetaminophen (TYLENOL) 650 MG CR tablet Take 650 mg by mouth as needed.    Historical Provider, MD  aspirin 81 MG tablet Take 81 mg by mouth daily.    Historical Provider, MD  azithromycin (ZITHROMAX) 250 MG tablet Take 250 mg by mouth as needed.    Historical Provider, MD  Calcium Carbonate-Vitamin D (CALCIUM + D PO) Take 2 capsules by mouth daily.    Historical Provider, MD  denosumab (PROLIA) 60 MG/ML SOLN injection Inject 60 mg into the skin every 6 (six) months. Administer in upper arm, thigh, or abdomen    Historical Provider, MD  Glucos-MSM-C-Mn-Ginger-Willow (GLUCOSAMINE MSM COMPLEX) TABS Take 2 tablets by mouth daily.    Historical Provider, MD  hydrocortisone (PROCTOSOL HC) 2.5 % rectal cream Place 1 application rectally 2 (two) times daily.    Historical Provider, MD  lactobacillus acidophilus & bulgar (LACTINEX) chewable tablet Chew 1 tablet by mouth 2 (two) times daily. Store in [place], Public affairs consultant, MD  lactulose [facility]) 10 GM/15ML solution TAKE 15 MLS ONCE DAILY AS DIRECTED 03/31/14   Ladene Artist, MD  Multiple Vitamin (MULTIVITAMIN) tablet Take 1 tablet by mouth daily.  Historical Provider, MD  Omega-3 Fatty Acids (FISH OIL) 1000 MG CAPS Take by mouth.    Historical Provider, MD  oxybutynin (DITROPAN) 5 MG tablet Take 5 mg by mouth daily.    Historical Provider, MD  pantoprazole (PROTONIX) 40 MG tablet Take 40 mg by mouth daily.    Historical Provider, MD  Probiotic Product (ALIGN) 4 MG CAPS Take 1 capsule by mouth daily.    Historical Provider, MD  saw palmetto 160 MG capsule Take 160 mg by mouth daily.    Historical Provider, MD  Tamsulosin HCl (FLOMAX) 0.4 MG CAPS Take 0.4 mg by mouth daily.    Historical Provider,  MD   BP 157/61 mmHg  Pulse 97  Temp(Src) 98.7 F (37.1 C) (Oral)  Resp 24  SpO2 85% Physical Exam  Constitutional: He is oriented to person, place, and time. He appears well-developed and well-nourished.  Non-toxic appearance. No distress.  HENT:  Head: Normocephalic and atraumatic.  Eyes: Conjunctivae, EOM and lids are normal. Pupils are equal, round, and reactive to light.  Neck: Normal range of motion. Neck supple. No tracheal deviation present. No thyroid mass present.  Cardiovascular: Normal rate, regular rhythm and normal heart sounds.  Exam reveals no gallop.   No murmur heard. Pulmonary/Chest: Effort normal. No stridor. No respiratory distress. He has decreased breath sounds. He has no wheezes. He has rhonchi. He has no rales.  Abdominal: Soft. Normal appearance and bowel sounds are normal. He exhibits no distension. There is no tenderness. There is no rebound and no CVA tenderness.  Musculoskeletal: Normal range of motion. He exhibits no edema or tenderness.  2+ pitting edema bilateral lower extremity  Neurological: He is alert and oriented to person, place, and time. He has normal strength. No cranial nerve deficit or sensory deficit. GCS eye subscore is 4. GCS verbal subscore is 5. GCS motor subscore is 6.  Skin: Skin is warm and dry. No abrasion and no rash noted.  Psychiatric: He has a normal mood and affect. His speech is normal and behavior is normal.  Nursing note and vitals reviewed.   ED Course  Procedures (including critical care time) Labs Review Labs Reviewed  BRAIN NATRIURETIC PEPTIDE  TROPONIN I  URINALYSIS, ROUTINE W REFLEX MICROSCOPIC  CBC WITH DIFFERENTIAL/PLATELET  COMPREHENSIVE METABOLIC PANEL  I-STAT CG4 LACTIC ACID, ED    Imaging Review No results found.   EKG Interpretation None      MDM   Final diagnoses:  None     Date: 10/04/2014  Rate: 94  Rhythm: normal sinus rhythm  QRS Axis: normal  Intervals: normal  ST/T Wave  abnormalities: normal  Conduction Disutrbances:none  Narrative Interpretation:   Old EKG Reviewed: none available    Patient protecting his airway well and resting currently. Started on antibiotics for community acquired pneumonia. Blood cultures ordered. Lactate within normal limits. We'll admit to the medicine service  Leota Jacobsen, MD 10/04/14 [phone]

## 2014-10-04 NOTE — ED Notes (Signed)
Patient aware that a urine sample is needed. Urinal is a the bedside

## 2014-10-05 ENCOUNTER — Inpatient Hospital Stay (HOSPITAL_COMMUNITY): Payer: PPO

## 2014-10-05 DIAGNOSIS — K219 Gastro-esophageal reflux disease without esophagitis: Secondary | ICD-10-CM

## 2014-10-05 DIAGNOSIS — J189 Pneumonia, unspecified organism: Secondary | ICD-10-CM

## 2014-10-05 LAB — COMPREHENSIVE METABOLIC PANEL
ALBUMIN: 2.7 g/dL — AB (ref 3.5–5.2)
ALK PHOS: 56 U/L (ref 39–117)
ALT: 17 U/L (ref 0–53)
ANION GAP: 6 (ref 5–15)
AST: 38 U/L — AB (ref 0–37)
BUN: 11 mg/dL (ref 6–23)
CALCIUM: 8 mg/dL — AB (ref 8.4–10.5)
CHLORIDE: 93 mmol/L — AB (ref 96–112)
CO2: 26 mmol/L (ref 19–32)
CREATININE: 0.53 mg/dL (ref 0.50–1.35)
GFR calc Af Amer: >90 mL/min (ref >90)
GFR calc non Af Amer: >90 mL/min (ref >90)
Glucose, Bld: 108 mg/dL — ABNORMAL HIGH (ref 70–99)
POTASSIUM: 3.7 mmol/L (ref 3.5–5.1)
Sodium: 125 mmol/L — ABNORMAL LOW (ref 135–145)
Total Bilirubin: 0.8 mg/dL (ref 0.3–1.2)
Total Protein: 5.6 g/dL — ABNORMAL LOW (ref 6.0–8.3)

## 2014-10-05 LAB — LEGIONELLA ANTIGEN, URINE

## 2014-10-05 LAB — HIV ANTIBODY (ROUTINE TESTING W REFLEX): HIV Screen 4th Generation wRfx: NONREACTIVE

## 2014-10-05 LAB — CBC
HCT: 29.3 % — ABNORMAL LOW (ref 39.0–52.0)
Hemoglobin: 9.9 g/dL — ABNORMAL LOW (ref 13.0–17.0)
MCH: 30.7 pg (ref 26.0–34.0)
MCHC: 33.8 g/dL (ref 30.0–36.0)
MCV: 91 fL (ref 78.0–100.0)
Platelets: 429 10*3/uL — ABNORMAL HIGH (ref 150–400)
RBC: 3.22 MIL/uL — ABNORMAL LOW (ref 4.22–5.81)
RDW: 13.9 % (ref 11.5–15.5)
WBC: 34.3 10*3/uL — ABNORMAL HIGH (ref 4.0–10.5)

## 2014-10-05 LAB — CLOSTRIDIUM DIFFICILE BY PCR: Toxigenic C. Difficile by PCR: NEGATIVE

## 2014-10-05 LAB — STREP PNEUMONIAE URINARY ANTIGEN: Strep Pneumo Urinary Antigen: NEGATIVE

## 2014-10-05 MED ORDER — MENTHOL 3 MG MT LOZG
1.0000 | LOZENGE | OROMUCOSAL | Status: DC | PRN
  Administered 2014-10-06: 3 mg via ORAL
  Filled 2014-10-05: qty 9

## 2014-10-05 MED ORDER — ALBUTEROL SULFATE (2.5 MG/3ML) 0.083% IN NEBU: 2.5000 mg | INHALATION_SOLUTION | RESPIRATORY_TRACT | Status: DC | PRN

## 2014-10-05 MED ORDER — SIMETHICONE 80 MG PO CHEW: 80.0000 mg | CHEWABLE_TABLET | Freq: Four times a day (QID) | ORAL | Status: DC | PRN

## 2014-10-05 NOTE — Progress Notes (Signed)
[facility] PROGRESS NOTE  KAYLOB WALLEN SEG:[id number] DOB: 07-04-1927 DOA: 10/04/2014 PCP: [name], MD  Assessment/Plan: 1. CAP 1. No recent hospitalizations in the past 3 months 2. Afebrile with radographic evidence of PNA 3. Presenting leukocytosis of 41K, now down to 34.3 4. Currently remains on min O2 support and stable 5. Remains on levaquin secondary to PCN allergy 6. If continued improvement, consider transition to PO abx 2. Leukocytosis 1. Trending down 2. Likely secondary to acute infection 3. Follow CBC 4. Afebrile 3. Diarrhea 1. Cdiff is neg 4. GERD 1. Cont PPI per home regimen 5. DJD 1. Stable currently 6. BPH 1. Cont flomax 7. HTN 1. BP stable 2. Cont home meds 8. DVT prophylaxis 1. Heparin subQ  Code Status: Full Family Communication: Pt in room, family at bedside (indicate person spoken with, relationship, and if by phone, the number) Disposition Plan: Pending   Consultants:  none  Procedures:  none  Antibiotics:  Levaquin 2/21>>> (indicate start date, and stop date if known)  HPI/Subjective: Reports diarrhea this AM. No other complaints  Objective: Filed Vitals:   10/05/14 0333 10/05/14 0439 10/05/14 1031 10/05/14 1358  BP:  131/59 130/55 130/60  Pulse:  79 79 83  Temp: 98.4 F (36.9 C) 98.6 F (37 C)  98 F (36.7 C)  TempSrc: Oral Oral  Oral  Resp:  20  20  Height:      Weight:      SpO2:  99%  100%    Intake/Output Summary (Last 24 hours) at 10/05/14 1611 Last data filed at 10/05/14 0844  Gross per 24 hour  Intake   2910 ml  Output    795 ml  Net   2115 ml   Filed Weights   10/04/14 1100  Weight: 74.844 kg (165 lb)    Exam:   General:  Awake, in nad  Cardiovascular: regular, s1, s2  Respiratory: normal resp effort, no wheezing  Abdomen: soft,nondistended  Musculoskeletal: perfused, no clubbing   Data Reviewed: Basic Metabolic Panel:  Recent Labs Lab 10/04/14 0834 10/05/14 0424  NA  126* 125*  K 3.9 3.7  CL 94* 93*  CO2 25 26  GLUCOSE 156* 108*  BUN 13 11  CREATININE 0.61 0.53  CALCIUM 9.1 8.0*   Liver Function Tests:  Recent Labs Lab 10/04/14 0834 10/05/14 0424  AST 24 38*  ALT 15 17  ALKPHOS 66 56  BILITOT 0.8 0.8  PROT 7.4 5.6*  ALBUMIN 3.7 2.7*   No results for input(s): LIPASE, AMYLASE in the last 168 hours. No results for input(s): AMMONIA in the last 168 hours. CBC:  Recent Labs Lab 10/04/14 0834 10/05/14 0424  WBC 41.1* 34.3*  NEUTROABS 36.1*  --   HGB 10.8* 9.9*  HCT 32.2* 29.3*  MCV 91.2 91.0  PLT 508* 429*   Cardiac Enzymes:  Recent Labs Lab 10/04/14 0834  TROPONINI <0.03   BNP (last 3 results)  Recent Labs  10/04/14 0831  BNP 107.6*    ProBNP (last 3 results) No results for input(s): PROBNP in the last 8760 hours.  CBG: No results for input(s): GLUCAP in the last 168 hours.  Recent Results (from the past 240 hour(s))  Culture, blood (routine x 2)     Status: None (Preliminary result)   Collection Time: 10/04/14  9:49 AM  Result Value Ref Range Status   Specimen Description BLOOD RIGHT ARM  Final   Special Requests BOTTLES DRAWN AEROBIC AND ANAEROBIC [facility] EACH  Final  Culture   Final           BLOOD CULTURE RECEIVED NO GROWTH TO DATE CULTURE WILL BE HELD FOR 5 DAYS BEFORE ISSUING A FINAL NEGATIVE REPORT Performed at [facility]    Report Status PENDING  Incomplete  Culture, blood (routine x 2)     Status: None (Preliminary result)   Collection Time: 10/04/14  9:49 AM  Result Value Ref Range Status   Specimen Description BLOOD RIGHT HAND  Final   Special Requests BOTTLES DRAWN AEROBIC AND ANAEROBIC 5CC EACH  Final   Culture   Final           BLOOD CULTURE RECEIVED NO GROWTH TO DATE CULTURE WILL BE HELD FOR 5 DAYS BEFORE ISSUING A FINAL NEGATIVE REPORT Performed at [facility]    Report Status PENDING  Incomplete  Culture, sputum-assessment     Status: None   Collection Time: 10/04/14  11:55 AM  Result Value Ref Range Status   Specimen Description SPUTUM  Final   Special Requests NONE  Final   Sputum evaluation   Final    THIS SPECIMEN IS ACCEPTABLE. RESPIRATORY CULTURE REPORT TO FOLLOW.   Report Status 10/04/2014 FINAL  Final  Culture, respiratory (NON-Expectorated)     Status: None (Preliminary result)   Collection Time: 10/04/14 11:55 AM  Result Value Ref Range Status   Specimen Description SPUTUM  Final   Special Requests NONE  Final   Gram Stain   Final    MODERATE WBC PRESENT, PREDOMINANTLY PMN NO SQUAMOUS EPITHELIAL CELLS SEEN FEW GRAM POSITIVE COCCI IN PAIRS IN CHAINS FEW GRAM NEGATIVE RODS Performed at [facility]    Culture   Final    Culture reincubated for better growth Performed at [facility]    Report Status PENDING  Incomplete  Clostridium Difficile by PCR     Status: None   Collection Time: 10/05/14  9:13 AM  Result Value Ref Range Status   C difficile by pcr NEGATIVE NEGATIVE Final    Comment: Performed at [facility]     Studies: Dg Chest 2 View  10/04/2014   CLINICAL DATA:  Sore throat and productive cough for 2 days  EXAM: CHEST  2 VIEW  COMPARISON:  03/11/2013  FINDINGS: The heart size appears normal. Calcified atherosclerotic disease involves the abdominal aorta. Lung volumes are low and there is atelectasis within both lung bases left greater night. Airspace opacity in the left base is best seen on the lateral radiograph.  IMPRESSION: 1. Left base airspace opacity consistent with pneumonia. 2. Bibasilar atelectasis.   Electronically Signed   By: Kerby Moors M.D.   On: 10/04/2014 09:15   Dg Abd Portable 1v  10/05/2014   CLINICAL DATA:  Constipation  EXAM: PORTABLE ABDOMEN - 1 VIEW  COMPARISON:  Lumbar spine images, 05/01/2014. Abdomen CT, 09/15/2010  FINDINGS: Normal bowel gas pattern.  No increased stool.  Stable left lower lobe renal stone. Vascular calcifications noted along the course of the abdominal aorta  and iliac arteries.  Clips are upper quadrant reflect a previous cholecystectomy, also stable.  There are degenerative changes of the lumbar spine. Bones are demineralized.  IMPRESSION: 1. No acute findings.  No evidence of bowel obstruction. 2. No increased stool.   Electronically Signed   By: Lajean Manes M.D.   On: 10/05/2014 12:30    Scheduled Meds: . aspirin EC  81 mg Oral QHS  . heparin  5,000 Units Subcutaneous 3  times per day  . lactobacillus acidophilus & bulgar  1 tablet Oral BID  . levofloxacin (LEVAQUIN) IV  750 mg Intravenous Q24H  . losartan  50 mg Oral Daily  . meloxicam  7.5 mg Oral Daily  . oxybutynin  5 mg Oral Daily  . pantoprazole  40 mg Oral Daily  . sodium chloride  3 mL Intravenous Q12H  . tamsulosin  0.4 mg Oral Daily   Continuous Infusions: . sodium chloride 100 mL/hr at 10/05/14 7076    Active Problems:   GERD   Community acquired bacterial pneumonia   Leukocytosis   Essential hypertension   BPH (benign prostatic hyperplasia)   CAP (community acquired pneumonia)   [facility], [place] Pager [phone]. If 7PM-7AM, please contact night-coverage at [email or web address], password [facility] 10/05/2014, 4:11 PM  LOS: 1 day

## 2014-10-06 DIAGNOSIS — E871 Hypo-osmolality and hyponatremia: Secondary | ICD-10-CM

## 2014-10-06 LAB — CBC
HCT: 31.3 % — ABNORMAL LOW (ref 39.0–52.0)
Hemoglobin: 10.5 g/dL — ABNORMAL LOW (ref 13.0–17.0)
MCH: 30.3 pg (ref 26.0–34.0)
MCHC: 33.5 g/dL (ref 30.0–36.0)
MCV: 90.5 fL (ref 78.0–100.0)
PLATELETS: 441 10*3/uL — AB (ref 150–400)
RBC: 3.46 MIL/uL — ABNORMAL LOW (ref 4.22–5.81)
RDW: 13.7 % (ref 11.5–15.5)
WBC: 20.7 10*3/uL — AB (ref 4.0–10.5)

## 2014-10-06 LAB — BASIC METABOLIC PANEL
Anion gap: 7 (ref 5–15)
BUN: 6 mg/dL (ref 6–23)
CHLORIDE: 93 mmol/L — AB (ref 96–112)
CO2: 26 mmol/L (ref 19–32)
Calcium: 8.3 mg/dL — ABNORMAL LOW (ref 8.4–10.5)
Creatinine, Ser: 0.37 mg/dL — ABNORMAL LOW (ref 0.50–1.35)
GFR calc non Af Amer: >90 mL/min (ref >90)
GLUCOSE: 123 mg/dL — AB (ref 70–99)
Potassium: 3.5 mmol/L (ref 3.5–5.1)
Sodium: 126 mmol/L — ABNORMAL LOW (ref 135–145)

## 2014-10-06 LAB — CULTURE, RESPIRATORY: CULTURE: NORMAL

## 2014-10-06 LAB — CULTURE, RESPIRATORY W GRAM STAIN

## 2014-10-06 LAB — SODIUM, URINE, RANDOM: Sodium, Ur: 10 mEq/L

## 2014-10-06 MED ORDER — TRAZODONE HCL 50 MG PO TABS
100.0000 mg | ORAL_TABLET | Freq: Every evening | ORAL | Status: DC | PRN
  Administered 2014-10-06: 100 mg via ORAL
  Filled 2014-10-06: qty 2

## 2014-10-06 MED ORDER — BENZONATATE 100 MG PO CAPS
100.0000 mg | ORAL_CAPSULE | Freq: Three times a day (TID) | ORAL | Status: DC | PRN
  Administered 2014-10-06 (×2): 100 mg via ORAL
  Filled 2014-10-06 (×2): qty 1

## 2014-10-06 NOTE — Progress Notes (Signed)
Patient awake the entire night with multiple complaints.   Unable to sleep, sore throat, complaints about the bed and the IV beeping, wants staff to stay in room with him.  No effort seemed to relieve any of the complaints.  Ambulated patient the entire length of the [place] wing this am on room air. Oxygen 94% on room air once returned to room.  Patient appears to be doing better clinically compared to the night before, just unable to stay calm enough to sleep.  Will continue to monitor patient.

## 2014-10-06 NOTE — Progress Notes (Signed)
PT Cancellation Note  Patient Details Name: Sean Cooke MRN: [id number] DOB: 1927/04/13   Cancelled Treatment:    Reason Eval/Treat Not Completed: PT screened, no needs identified, will sign off  Noted pt was up ambulating with nursing staff this morning. Spoke with patient and he stated he just walked around the unit again with staff on RA and no assistive device. CNA was taking vitals at time I entered and on RA , talking pt at 97%. Explained our role and what we could help with, pt stated he is doing fine ,"no worries with getting around, I do it just fine, just can't do everything I used to in the past, like mowing, etc.". Pt felt no needs at this time, and has no worries about mobilizing at home.   No PT eval required, pt just screened and will not pick up pt on caseload at this time. However reorder if anything changes.   Thanks   Clide Dales 10/06/2014, 2:06 PM  Clide Dales, PT Pager: [phone] 10/06/2014

## 2014-10-06 NOTE — Progress Notes (Signed)
[facility] PROGRESS NOTE  SHAARAV RIPPLE NOM:[id number] DOB: 01-08-1927 DOA: 10/04/2014 PCP: [name], MD  Assessment/Plan: 1. CAP 1. No recent hospitalizations in the past 3 months 2. Afebrile with radographic evidence of PNA 3. Presenting leukocytosis of 41K, now down to 34.3 4. Currently remains on min O2 support and stable 5. Remains on levaquin secondary to PCN allergy 6. If continued improvement, consider transition to PO abx 2. Leukocytosis 1. Trending down 2. Likely secondary to acute infection 3. Follow CBC 4. Afebrile 3. Diarrhea 1. Cdiff is neg 4. GERD 1. Cont PPI per home regimen 5. DJD 1. Stable currently 6. BPH 1. Cont flomax 7. HTN 1. BP stable 2. Cont home meds 8. DVT prophylaxis 1. Heparin subQ 9. Hyponatremia 1. Sodium remaining stable at around 126 2. Urine sodium low at 10 3. Pt is clinically dehydrated 4. Suspect low Na secondary to dehydration 5. Will increase IVF to 125cc/hr as tolerated x 6hrs then resume at 100cc/hr 6. Repeat Na in AM  Code Status: Full Family Communication: Pt in room   Consultants:  none  Procedures:  none  Antibiotics:  Levaquin 2/21>>> (indicate start date, and stop date if known)  HPI/Subjective: Complains of back pains and being unable to sleep as a result  Objective: Filed Vitals:   10/06/14 0533 10/06/14 0659 10/06/14 0702 10/06/14 1345  BP: 180/82 175/74 163/82 159/74  Pulse: 81 85 86 87  Temp: 98.3 F (36.8 C)   98.2 F (36.8 C)  TempSrc: Oral   Oral  Resp: 18   16  Height:      Weight:      SpO2: 100% 93% 91% 97%    Intake/Output Summary (Last 24 hours) at 10/06/14 1822 Last data filed at 10/06/14 1710  Gross per 24 hour  Intake 3230.84 ml  Output   2175 ml  Net 1055.84 ml   Filed Weights   10/04/14 1100  Weight: 74.844 kg (165 lb)    Exam:   General:  Awake, sitting in chair, in nad  Cardiovascular: regular, s1, s2  Respiratory: normal resp effort, no  wheezing  Abdomen: soft,nondistended  Musculoskeletal: perfused, no clubbing   Data Reviewed: Basic Metabolic Panel:  Recent Labs Lab 10/04/14 0834 10/05/14 0424 10/06/14 0813  NA 126* 125* 126*  K 3.9 3.7 3.5  CL 94* 93* 93*  CO2 25 26 26   GLUCOSE 156* 108* 123*  BUN 13 11 6   CREATININE 0.61 0.53 0.37*  CALCIUM 9.1 8.0* 8.3*   Liver Function Tests:  Recent Labs Lab 10/04/14 0834 10/05/14 0424  AST 24 38*  ALT 15 17  ALKPHOS 66 56  BILITOT 0.8 0.8  PROT 7.4 5.6*  ALBUMIN 3.7 2.7*   No results for input(s): LIPASE, AMYLASE in the last 168 hours. No results for input(s): AMMONIA in the last 168 hours. CBC:  Recent Labs Lab 10/04/14 0834 10/05/14 0424 10/06/14 0813  WBC 41.1* 34.3* 20.7*  NEUTROABS 36.1*  --   --   HGB 10.8* 9.9* 10.5*  HCT 32.2* 29.3* 31.3*  MCV 91.2 91.0 90.5  PLT 508* 429* 441*   Cardiac Enzymes:  Recent Labs Lab 10/04/14 0834  TROPONINI <0.03   BNP (last 3 results)  Recent Labs  10/04/14 0831  BNP 107.6*    ProBNP (last 3 results) No results for input(s): PROBNP in the last 8760 hours.  CBG: No results for input(s): GLUCAP in the last 168 hours.  Recent Results (from the past 240 hour(s))  Culture,  blood (routine x 2)     Status: None (Preliminary result)   Collection Time: 10/04/14  9:49 AM  Result Value Ref Range Status   Specimen Description BLOOD RIGHT ARM  Final   Special Requests BOTTLES DRAWN AEROBIC AND ANAEROBIC 5CC EACH  Final   Culture   Final           BLOOD CULTURE RECEIVED NO GROWTH TO DATE CULTURE WILL BE HELD FOR 5 DAYS BEFORE ISSUING A FINAL NEGATIVE REPORT Performed at [facility]    Report Status PENDING  Incomplete  Culture, blood (routine x 2)     Status: None (Preliminary result)   Collection Time: 10/04/14  9:49 AM  Result Value Ref Range Status   Specimen Description BLOOD RIGHT HAND  Final   Special Requests BOTTLES DRAWN AEROBIC AND ANAEROBIC 5CC EACH  Final   Culture   Final            BLOOD CULTURE RECEIVED NO GROWTH TO DATE CULTURE WILL BE HELD FOR 5 DAYS BEFORE ISSUING A FINAL NEGATIVE REPORT Performed at [facility]    Report Status PENDING  Incomplete  Culture, sputum-assessment     Status: None   Collection Time: 10/04/14 11:55 AM  Result Value Ref Range Status   Specimen Description SPUTUM  Final   Special Requests NONE  Final   Sputum evaluation   Final    THIS SPECIMEN IS ACCEPTABLE. RESPIRATORY CULTURE REPORT TO FOLLOW.   Report Status 10/04/2014 FINAL  Final  Culture, respiratory (NON-Expectorated)     Status: None   Collection Time: 10/04/14 11:55 AM  Result Value Ref Range Status   Specimen Description SPUTUM  Final   Special Requests NONE  Final   Gram Stain   Final    MODERATE WBC PRESENT, PREDOMINANTLY PMN NO SQUAMOUS EPITHELIAL CELLS SEEN FEW GRAM POSITIVE COCCI IN PAIRS IN CHAINS FEW GRAM NEGATIVE RODS Performed at [facility]    Culture   Final    NORMAL OROPHARYNGEAL FLORA Performed at [facility]    Report Status 10/06/2014 FINAL  Final  Clostridium Difficile by PCR     Status: None   Collection Time: 10/05/14  9:13 AM  Result Value Ref Range Status   C difficile by pcr NEGATIVE NEGATIVE Final    Comment: Performed at [facility]     Studies: Dg Abd Portable 1v  10/05/2014   CLINICAL DATA:  Constipation  EXAM: PORTABLE ABDOMEN - 1 VIEW  COMPARISON:  Lumbar spine images, 05/01/2014. Abdomen CT, 09/15/2010  FINDINGS: Normal bowel gas pattern.  No increased stool.  Stable left lower lobe renal stone. Vascular calcifications noted along the course of the abdominal aorta and iliac arteries.  Clips are upper quadrant reflect a previous cholecystectomy, also stable.  There are degenerative changes of the lumbar spine. Bones are demineralized.  IMPRESSION: 1. No acute findings.  No evidence of bowel obstruction. 2. No increased stool.   Electronically Signed   By: Lajean Manes M.D.   On: 10/05/2014  12:30    Scheduled Meds: . aspirin EC  81 mg Oral QHS  . heparin  5,000 Units Subcutaneous 3 times per day  . lactobacillus acidophilus & bulgar  1 tablet Oral BID  . levofloxacin (LEVAQUIN) IV  750 mg Intravenous Q24H  . losartan  50 mg Oral Daily  . meloxicam  7.5 mg Oral Daily  . oxybutynin  5 mg Oral Daily  . pantoprazole  40 mg Oral  Daily  . sodium chloride  3 mL Intravenous Q12H  . tamsulosin  0.4 mg Oral Daily   Continuous Infusions: . sodium chloride 125 mL/hr at 10/06/14 1229    Active Problems:   GERD   Community acquired bacterial pneumonia   Leukocytosis   Essential hypertension   BPH (benign prostatic hyperplasia)   CAP (community acquired pneumonia)   Clay Solum, [facility] Pager [phone]. If 7PM-7AM, please contact night-coverage at [email or web address], password [facility] 10/06/2014, 6:22 PM  LOS: 2 days

## 2014-10-07 DIAGNOSIS — E871 Hypo-osmolality and hyponatremia: Secondary | ICD-10-CM | POA: Diagnosis present

## 2014-10-07 DIAGNOSIS — K5900 Constipation, unspecified: Secondary | ICD-10-CM

## 2014-10-07 DIAGNOSIS — D473 Essential (hemorrhagic) thrombocythemia: Secondary | ICD-10-CM

## 2014-10-07 LAB — BASIC METABOLIC PANEL
ANION GAP: 5 (ref 5–15)
BUN: 6 mg/dL (ref 6–23)
CHLORIDE: 97 mmol/L (ref 96–112)
CO2: 28 mmol/L (ref 19–32)
Calcium: 8.2 mg/dL — ABNORMAL LOW (ref 8.4–10.5)
Creatinine, Ser: 0.52 mg/dL (ref 0.50–1.35)
GFR calc Af Amer: >90 mL/min (ref >90)
GFR calc non Af Amer: >90 mL/min (ref >90)
Glucose, Bld: 133 mg/dL — ABNORMAL HIGH (ref 70–99)
Potassium: 4.2 mmol/L (ref 3.5–5.1)
SODIUM: 130 mmol/L — AB (ref 135–145)

## 2014-10-07 LAB — CBC
HCT: 27.7 % — ABNORMAL LOW (ref 39.0–52.0)
HEMOGLOBIN: 9.3 g/dL — AB (ref 13.0–17.0)
MCH: 30.3 pg (ref 26.0–34.0)
MCHC: 33.6 g/dL (ref 30.0–36.0)
MCV: 90.2 fL (ref 78.0–100.0)
Platelets: 472 10*3/uL — ABNORMAL HIGH (ref 150–400)
RBC: 3.07 MIL/uL — ABNORMAL LOW (ref 4.22–5.81)
RDW: 13.9 % (ref 11.5–15.5)
WBC: 19 10*3/uL — AB (ref 4.0–10.5)

## 2014-10-07 MED ORDER — LEVOFLOXACIN 750 MG PO TABS: 750.0000 mg | ORAL_TABLET | Freq: Every day | ORAL | Status: DC

## 2014-10-07 MED ORDER — LACTULOSE 10 GM/15ML PO SOLN: 20.0000 g | Freq: Every day | ORAL | Status: DC

## 2014-10-07 MED ORDER — BENZONATATE 100 MG PO CAPS: 100.0000 mg | ORAL_CAPSULE | Freq: Three times a day (TID) | ORAL | Status: DC | PRN

## 2014-10-07 NOTE — Discharge Instructions (Signed)

## 2014-10-07 NOTE — Discharge Summary (Signed)
Physician Discharge Summary  Sean Cooke VEL:[id number] DOB: 1927/03/01 DOA: 10/04/2014  PCP: [name], MD  Admit date: 10/04/2014 Discharge date: 10/07/2014   Recommendations for Outpatient Follow-Up:   1. Recommend F/U with PCP in 2 weeks to ensure complete resolution of pneumonia & to F/U final blood culture results.   Discharge Diagnosis:   Principal Problem:    Community acquired bacterial pneumonia Active Problems:    THROMBOCYTHEMIA    GERD    ASPLENIA    Constipation    Leukocytosis    Essential hypertension    BPH (benign prostatic hyperplasia)    Normocytic anemia   Discharge Condition: Improved.  Diet recommendation: Low sodium, heart healthy.    History of Present Illness:   Sean Cooke is an 81 y.o. male with a PMH of GERD, DJD, hiatal hernia who was admitted 10/04/14 with a chief complaint of productive cough and progressive dyspnea. On initial evaluation, the patient was hypoxic with oxygen saturations of 85% on room air and an elevated WBC of 41. Chest x-ray showed left-sided pneumonia.  Hospital Course by Problem:   Principal Problem:   Community acquired bacterial pneumonia/leukocytosis in the setting of asplenia - Treated with IV Levaquin with improvement in symptoms.  D/C home on 5 more days of oral therapy. - HIV negative, strep pneumoniae/legionella antigens negative, sputum culture grew normal oropharyngeal flora. - F/U final blood culture results.  Active Problems:   Hyponatremia -Likely from acute lung infection, improving with clearance of infection.    THROMBOCYTHEMIA - Chronic, stable.    GERD - Continue PPI.    Constipation -Resume Lactulose.    Essential hypertension - Continue Cozaar.    BPH (benign prostatic hyperplasia) - Continue Flomax and Ditropan.   Medical Consultants:    None.   Discharge Exam:   Filed Vitals:   10/07/14 1349  BP: 166/70  Pulse: 87  Temp: 98.1 F (36.7 C)    Resp: 18   Filed Vitals:   10/06/14 2146 10/07/14 0429 10/07/14 0911 10/07/14 1349  BP: 178/74 162/78 173/74 166/70  Pulse: 88 93 86 87  Temp: 98.3 F (36.8 C) 98.1 F (36.7 C) 98 F (36.7 C) 98.1 F (36.7 C)  TempSrc: Oral Oral Oral Oral  Resp:  18 18 18   Height:      Weight:      SpO2: 94% 99% 97% 93%    Gen:  NAD Cardiovascular:  RRR, No M/R/G Respiratory: Lungs diminished Gastrointestinal: Abdomen soft, NT/ND with normal active bowel sounds. Extremities: No C/E/C   The results of significant diagnostics from this hospitalization (including imaging, microbiology, ancillary and laboratory) are listed below for reference.     Procedures and Diagnostic Studies:   Dg Chest 2 View  10/04/2014   CLINICAL DATA:  Sore throat and productive cough for 2 days  EXAM: CHEST  2 VIEW  COMPARISON:  03/11/2013  FINDINGS: The heart size appears normal. Calcified atherosclerotic disease involves the abdominal aorta. Lung volumes are low and there is atelectasis within both lung bases left greater night. Airspace opacity in the left base is best seen on the lateral radiograph.  IMPRESSION: 1. Left base airspace opacity consistent with pneumonia. 2. Bibasilar atelectasis.   Electronically Signed   By: Kerby Moors M.D.   On: 10/04/2014 09:15   Dg Abd Portable 1v  10/05/2014   CLINICAL DATA:  Constipation  EXAM: PORTABLE ABDOMEN - 1 VIEW  COMPARISON:  Lumbar spine images, 05/01/2014. Abdomen CT, 09/15/2010  FINDINGS:  Normal bowel gas pattern.  No increased stool.  Stable left lower lobe renal stone. Vascular calcifications noted along the course of the abdominal aorta and iliac arteries.  Clips are upper quadrant reflect a previous cholecystectomy, also stable.  There are degenerative changes of the lumbar spine. Bones are demineralized.  IMPRESSION: 1. No acute findings.  No evidence of bowel obstruction. 2. No increased stool.   Electronically Signed   By: Lajean Manes M.D.   On: 10/05/2014  12:30     Labs:   Basic Metabolic Panel:  Recent Labs Lab 10/04/14 0834 10/05/14 0424 10/06/14 0813 10/07/14 0535  NA 126* 125* 126* 130*  K 3.9 3.7 3.5 4.2  CL 94* 93* 93* 97  CO2 25 26 26 28   GLUCOSE 156* 108* 123* 133*  BUN 13 11 6 6   CREATININE 0.61 0.53 0.37* 0.52  CALCIUM 9.1 8.0* 8.3* 8.2*   GFR Estimated Creatinine Clearance: 65.3 mL/min (by C-G formula based on Cr of 0.52). Liver Function Tests:  Recent Labs Lab 10/04/14 0834 10/05/14 0424  AST 24 38*  ALT 15 17  ALKPHOS 66 56  BILITOT 0.8 0.8  PROT 7.4 5.6*  ALBUMIN 3.7 2.7*   CBC:  Recent Labs Lab 10/04/14 0834 10/05/14 0424 10/06/14 0813 10/07/14 0535  WBC 41.1* 34.3* 20.7* 19.0*  NEUTROABS 36.1*  --   --   --   HGB 10.8* 9.9* 10.5* 9.3*  HCT 32.2* 29.3* 31.3* 27.7*  MCV 91.2 91.0 90.5 90.2  PLT 508* 429* 441* 472*   Cardiac Enzymes:  Recent Labs Lab 10/04/14 0834  TROPONINI <0.03   Microbiology Recent Results (from the past 240 hour(s))  Culture, blood (routine x 2)     Status: None (Preliminary result)   Collection Time: 10/04/14  9:49 AM  Result Value Ref Range Status   Specimen Description BLOOD RIGHT ARM  Final   Special Requests BOTTLES DRAWN AEROBIC AND ANAEROBIC 5CC EACH  Final   Culture   Final           BLOOD CULTURE RECEIVED NO GROWTH TO DATE CULTURE WILL BE HELD FOR 5 DAYS BEFORE ISSUING A FINAL NEGATIVE REPORT Performed at [facility]    Report Status PENDING  Incomplete  Culture, blood (routine x 2)     Status: None (Preliminary result)   Collection Time: 10/04/14  9:49 AM  Result Value Ref Range Status   Specimen Description BLOOD RIGHT HAND  Final   Special Requests BOTTLES DRAWN AEROBIC AND ANAEROBIC 5CC EACH  Final   Culture   Final           BLOOD CULTURE RECEIVED NO GROWTH TO DATE CULTURE WILL BE HELD FOR 5 DAYS BEFORE ISSUING A FINAL NEGATIVE REPORT Performed at [facility]    Report Status PENDING  Incomplete  Culture,  sputum-assessment     Status: None   Collection Time: 10/04/14 11:55 AM  Result Value Ref Range Status   Specimen Description SPUTUM  Final   Special Requests NONE  Final   Sputum evaluation   Final    THIS SPECIMEN IS ACCEPTABLE. RESPIRATORY CULTURE REPORT TO FOLLOW.   Report Status 10/04/2014 FINAL  Final  Culture, respiratory (NON-Expectorated)     Status: None   Collection Time: 10/04/14 11:55 AM  Result Value Ref Range Status   Specimen Description SPUTUM  Final   Special Requests NONE  Final   Gram Stain   Final    MODERATE WBC PRESENT, PREDOMINANTLY PMN NO  SQUAMOUS EPITHELIAL CELLS SEEN FEW GRAM POSITIVE COCCI IN PAIRS IN CHAINS FEW GRAM NEGATIVE RODS Performed at [facility]    Culture   Final    NORMAL OROPHARYNGEAL FLORA Performed at [facility]    Report Status 10/06/2014 FINAL  Final  Clostridium Difficile by PCR     Status: None   Collection Time: 10/05/14  9:13 AM  Result Value Ref Range Status   C difficile by pcr NEGATIVE NEGATIVE Final    Comment: Performed at [facility]     Discharge Instructions:       Discharge Instructions    Call MD for:  difficulty breathing, headache or visual disturbances    Complete by:  As directed      Call MD for:  extreme fatigue    Complete by:  As directed      Call MD for:  persistant dizziness or light-headedness    Complete by:  As directed      Call MD for:  temperature >100.4    Complete by:  As directed      Diet - low sodium heart healthy    Complete by:  As directed      Discharge instructions    Complete by:  As directed   You were treated for pneumonia while you were in the hospital.  It is important that you see your PCP in follow up, and have him/her order a follow up chest x-ray in 4-6 weeks to ensure resolution of the pneumonia and to exclude any underlying pathology.  Take all of your antibiotics, as prescribed, even if you feel better.  Do not discontinue antibiotics  prematurely.  You were cared for by Dr. Jacquelynn Cree  (a hospitalist) during your hospital stay. If you have any questions about your discharge medications or the care you received while you were in the hospital after you are discharged, you can call the unit and ask to speak with the hospitalist on call if the hospitalist that took care of you is not available. Once you are discharged, your primary care physician will handle any further medical issues. Please note that NO REFILLS for any discharge medications will be authorized once you are discharged, as it is imperative that you return to your primary care physician (or establish a relationship with a primary care physician if you do not have one) for your aftercare needs so that they can reassess your need for medications and monitor your lab values.  Any outstanding tests can be reviewed by your PCP at your follow up visit.  It is also important to review any medicine changes with your PCP.  Please bring these d/c instructions with you to your next visit so your physician can review these changes with you.  If you do not have a primary care physician, you can call [phone] for a physician referral.  It is highly recommended that you obtain a PCP for hospital follow up.     Increase activity slowly    Complete by:  As directed             Medication List    STOP taking these medications        azithromycin 250 MG tablet  Commonly known as:  ZITHROMAX      TAKE these medications        acetaminophen 650 MG CR tablet  Commonly known as:  TYLENOL  Take 650 mg by mouth as needed for pain.  ALIGN 4 MG Caps  Take 1 capsule by mouth daily.     aspirin 81 MG tablet  Take 81 mg by mouth at bedtime.     benzonatate 100 MG capsule  Commonly known as:  TESSALON  Take 1 capsule (100 mg total) by mouth 3 (three) times daily as needed for cough.     CALCIUM + D PO  Take 2 capsules by mouth daily.     dextromethorphan-guaiFENesin  10-100 MG/5ML liquid  Commonly known as:  ROBITUSSIN-DM  Take 10 mLs by mouth every 4 (four) hours as needed for cough.     FIRST-DUKES MOUTHWASH MT  Use as directed 10 mLs in the mouth or throat 3 (three) times daily.     Fish Oil 1000 MG Caps  Take 1,000 mg by mouth daily.     FLOMAX 0.4 MG Caps capsule  Generic drug:  tamsulosin  Take 0.4 mg by mouth daily.     GLUCOSAMINE MSM COMPLEX Tabs  Take 2 tablets by mouth daily.     lactobacillus acidophilus & bulgar chewable tablet  Chew 1 tablet by mouth 2 (two) times daily. Store in [employer], OTC     lactulose 10 GM/15ML solution  Commonly known as:  CHRONULAC  TAKE 15 MLS ONCE DAILY AS DIRECTED     levofloxacin 750 MG tablet  Commonly known as:  LEVAQUIN  Take 1 tablet (750 mg total) by mouth daily.     losartan 50 MG tablet  Commonly known as:  COZAAR  Take 50 mg by mouth daily.     meloxicam 7.5 MG tablet  Commonly known as:  MOBIC  Take 7.5 mg by mouth daily.     multivitamin tablet  Take 1 tablet by mouth daily.     oxybutynin 5 MG tablet  Commonly known as:  DITROPAN  Take 5 mg by mouth daily.     pantoprazole 40 MG tablet  Commonly known as:  PROTONIX  Take 40 mg by mouth daily.     PROCTOSOL HC 2.5 % rectal cream  Generic drug:  hydrocortisone  Place 1 application rectally 2 (two) times daily.     PROLIA 60 MG/ML Soln injection  Generic drug:  denosumab  Inject 60 mg into the skin every 6 (six) months. Administer in upper arm, thigh, or abdomen     saw palmetto 160 MG capsule  Take 160 mg by mouth daily.       Follow-up Information    Follow up with JOBE,DANIEL B., MD. Schedule an appointment as soon as possible for a visit in 2 weeks.   Specialty:  Internal Medicine   Why:  Hospital follow up.   Contact information:   [address] [place] [address] [phone]        Time coordinating discharge: 35 minutes.  Signed:  Lennox Dolberry  Pager [phone] [facility] 10/07/2014, 2:36 PM

## 2014-10-07 NOTE — Progress Notes (Signed)
D/c instructions reviewed w/ pt and wife. Both verbalize understanding and all questions answered. Pt d/c in w/c in stable condition by NT to wife's car. Pt in possession of d/c instructions and all personal belongings. Pt and wife aware of need to p/u prescriptions at pharmacy.

## 2014-10-10 LAB — CULTURE, BLOOD (ROUTINE X 2)
CULTURE: NO GROWTH
CULTURE: NO GROWTH

## 2014-11-23 ENCOUNTER — Other Ambulatory Visit: Payer: Self-pay | Admitting: Gastroenterology

## 2015-01-07 ENCOUNTER — Encounter: Payer: Self-pay | Admitting: Gastroenterology

## 2015-04-16 ENCOUNTER — Other Ambulatory Visit: Payer: Self-pay | Admitting: Gastroenterology

## 2015-05-26 ENCOUNTER — Other Ambulatory Visit: Payer: Self-pay | Admitting: Gastroenterology

## 2015-05-26 ENCOUNTER — Telehealth: Payer: Self-pay | Admitting: Gastroenterology

## 2015-05-27 NOTE — Telephone Encounter (Signed)
Informed patient he can have this medication refilled through his PCP in the future unless he is having GI problems he does not have to follow up with Dr. Fuller Plan. Pt verbalized understanding. Pt will contact his PCP for refills and cancel appt if needed.

## 2015-05-31 ENCOUNTER — Other Ambulatory Visit: Payer: Self-pay

## 2015-05-31 MED ORDER — LACTULOSE 10 GM/15ML PO SOLN: ORAL | Status: DC

## 2015-06-25 ENCOUNTER — Ambulatory Visit: Payer: PPO | Admitting: Gastroenterology

## 2015-07-01 ENCOUNTER — Telehealth: Payer: Self-pay | Admitting: Hematology & Oncology

## 2015-07-01 NOTE — Telephone Encounter (Signed)
Sean Cooke from Dr. Royston Sinner office returned my call regarding authorization for [facility] [employer] for new patient appt. Dr. Valora Piccolo has sent a referral for our now mutual patient, Sean Cooke. I called Dr. Royston Sinner office and l/m for [facility] to call us regarding an authorization from [facility] to see [name] as a new patient in our office. Suanne Marker stated to me that [facility] [employer] no longer requires an authorization from the patient's PCP for patient to be seen at a specialty office.       [facility].

## 2015-07-07 ENCOUNTER — Ambulatory Visit (HOSPITAL_BASED_OUTPATIENT_CLINIC_OR_DEPARTMENT_OTHER): Payer: PPO

## 2015-07-07 ENCOUNTER — Encounter: Payer: Self-pay | Admitting: Hematology & Oncology

## 2015-07-07 ENCOUNTER — Other Ambulatory Visit (HOSPITAL_BASED_OUTPATIENT_CLINIC_OR_DEPARTMENT_OTHER): Payer: PPO

## 2015-07-07 ENCOUNTER — Ambulatory Visit (HOSPITAL_BASED_OUTPATIENT_CLINIC_OR_DEPARTMENT_OTHER): Payer: PPO | Admitting: Hematology & Oncology

## 2015-07-07 VITALS — BP 159/64 | HR 65 | Temp 97.4°F | Resp 16 | Ht 68.0 in | Wt 168.0 lb

## 2015-07-07 DIAGNOSIS — C3412 Malignant neoplasm of upper lobe, left bronchus or lung: Secondary | ICD-10-CM

## 2015-07-07 LAB — CBC WITH DIFFERENTIAL (CANCER CENTER ONLY)
BASO#: 0 10*3/uL (ref 0.0–0.2)
BASO%: 0.2 % (ref 0.0–2.0)
EOS%: 2.8 % (ref 0.0–7.0)
Eosinophils Absolute: 0.3 10*3/uL (ref 0.0–0.5)
HCT: 31.8 % — ABNORMAL LOW (ref 38.7–49.9)
HEMOGLOBIN: 10.7 g/dL — AB (ref 13.0–17.1)
LYMPH#: 1.6 10*3/uL (ref 0.9–3.3)
LYMPH%: 15.1 % (ref 14.0–48.0)
MCH: 30.7 pg (ref 28.0–33.4)
MCHC: 33.6 g/dL (ref 32.0–35.9)
MCV: 91 fL (ref 82–98)
MONO#: 1.7 10*3/uL — AB (ref 0.1–0.9)
MONO%: 16.3 % — ABNORMAL HIGH (ref 0.0–13.0)
NEUT%: 65.6 % (ref 40.0–80.0)
NEUTROS ABS: 6.9 10*3/uL — AB (ref 1.5–6.5)
Platelets: 398 10*3/uL (ref 145–400)
RBC: 3.48 10*6/uL — ABNORMAL LOW (ref 4.20–5.70)
RDW: 14.2 % (ref 11.1–15.7)
WBC: 10.5 10*3/uL — ABNORMAL HIGH (ref 4.0–10.0)

## 2015-07-07 LAB — COMPREHENSIVE METABOLIC PANEL (CC13)
ALBUMIN: 3.6 g/dL (ref 3.5–5.0)
ALK PHOS: 60 U/L (ref 40–150)
ALT: 17 U/L (ref 0–55)
AST: 24 U/L (ref 5–34)
Anion Gap: 5 mEq/L (ref 3–11)
BUN: 11.9 mg/dL (ref 7.0–26.0)
CO2: 27 mEq/L (ref 22–29)
Calcium: 10 mg/dL (ref 8.4–10.4)
Chloride: 102 mEq/L (ref 98–109)
Creatinine: 0.7 mg/dL (ref 0.7–1.3)
EGFR: 85 mL/min/{1.73_m2} — AB (ref >90)
GLUCOSE: 95 mg/dL (ref 70–140)
POTASSIUM: 4.2 meq/L (ref 3.5–5.1)
SODIUM: 134 meq/L — AB (ref 136–145)
Total Bilirubin: 0.38 mg/dL (ref 0.20–1.20)
Total Protein: 6.5 g/dL (ref 6.4–8.3)

## 2015-07-07 LAB — LACTATE DEHYDROGENASE (CC13): LDH: 167 U/L (ref 125–245)

## 2015-07-07 LAB — CHCC SATELLITE - SMEAR

## 2015-07-09 NOTE — Progress Notes (Signed)
Referral MD  Reason for Referral: Stage IIIB ([id number]) adenocarcinoma of the right lung   Chief Complaint  Patient presents with  . OTHER    New Patient  : I'm here because I have lung cancer.  HPI: Sean Cooke is a very nice 81 year old white male. He actually isn't good shape. He does have other health issues. He has had his spleen taken out. He has had his gallbladder taken out.  He was having some abdominal pain back in October. He underwent a CT of the abdomen. This showed a nodule in the right lung.  A CT of the chest was done in October 13. This showed a 1.7 cm right middle lobe nodule. There was a low density pericardial effusion. There is some right hilar adenopathy.  He then had a PET scan done. The PET scan surprisingly enough showed that there was minimal hyper metabolic activity in the right lung nodule. He had metabolic activity in the left mediastinal lymph nodes and right paratracheal lymph nodes. I think there may been some activity in a left supraclavicular lymph node.  He then underwent a biopsy of the left supraclavicular lymph node. This was done on November 10. The pathology report ([id number]) showed metastatic adenocarcinoma. This was thought to be consistent with a lung primary.  He was then referred to the [facility] for an evaluation.  He looks pretty good. He's had no cough or shortness of breath. If he has not smoked probably for about 40 years.  He's had no weight loss or weight gain.  He's had no headache.  Does not be any change in bowel or bladder habits. He does have a benign prostate hypertrophy.  His appetite has been pretty good.  Overall, his performance status is ECOG 1.            Past Medical History  Diagnosis Date  . GERD (gastroesophageal reflux disease)   . Hiatal hernia   . Degenerative joint disease   . Rib fracture   . Diverticulosis   . Adenomatous colon polyp 05/2003  . Duodenal diverticulum    . Neuritis/radiculitis due to displacement of lumbar intervertebral disc   . Arthritis   . Pneumonia   . Hemorrhoids   :  Past Surgical History  Procedure Laterality Date  . Inguinal hernia repair    . Transurethral resection of prostate    . Splenectomy  2002  . Cataract extraction      bilateral  . Cholecystectomy  06/2003  :   Current outpatient prescriptions:  .  acetaminophen (TYLENOL) 650 MG CR tablet, Take 650 mg by mouth as needed for pain. , Disp: , Rfl:  .  aspirin 81 MG tablet, Take 81 mg by mouth at bedtime. , Disp: , Rfl:  .  benzonatate (TESSALON) 100 MG capsule, Take 1 capsule (100 mg total) by mouth 3 (three) times daily as needed for cough., Disp: 20 capsule, Rfl: 0 .  Calcium Carbonate-Vitamin D (CALCIUM + D PO), Take 2 capsules by mouth daily., Disp: , Rfl:  .  cromolyn (OPTICROM) 4 % ophthalmic solution, Administer 1 drop to both eyes Four (4) times a day., Disp: , Rfl:  .  denosumab (PROLIA) 60 MG/ML SOLN injection, Inject 60 mg into the skin every 6 (six) months. Administer in upper arm, thigh, or abdomen, Disp: , Rfl:  .  dextromethorphan-guaiFENesin (ROBITUSSIN-DM) 10-100 MG/5ML liquid, Take 10 mLs by mouth every 4 (four) hours as needed for cough., Disp: , Rfl:  .  Diphenhyd-Hydrocort-Nystatin (FIRST-DUKES MOUTHWASH MT), Use as directed 10 mLs in the mouth or throat 3 (three) times daily., Disp: , Rfl:  .  Glucos-MSM-C-Mn-Ginger-Willow (GLUCOSAMINE MSM COMPLEX) TABS, Take 2 tablets by mouth daily., Disp: , Rfl:  .  Glucosamine-Chondroitin 750-600 MG TABS, Take by mouth., Disp: , Rfl:  .  hydrocortisone (PROCTOSOL HC) 2.5 % rectal cream, Place 1 application rectally 2 (two) times daily., Disp: , Rfl:  .  lactobacillus acidophilus & bulgar (LACTINEX) chewable tablet, Chew 1 tablet by mouth 2 (two) times daily. Store in [place], Disp: , Rfl:  .  lactulose ([place]) 10 GM/15ML solution, TAKE 15 MLS ONCE DAILY AS DIRECTED, Disp: 473 mL, Rfl: 0 .   levofloxacin (LEVAQUIN) 750 MG tablet, Take 1 tablet (750 mg total) by mouth daily., Disp: 5 tablet, Rfl: 0 .  loratadine (CLARITIN) 10 MG tablet, Take 10 mg by mouth., Disp: , Rfl:  .  losartan (COZAAR) 50 MG tablet, Take 50 mg by mouth daily., Disp: , Rfl:  .  meloxicam (MOBIC) 7.5 MG tablet, Take 7.5 mg by mouth daily., Disp: , Rfl:  .  Multiple Vitamin (MULTIVITAMIN) tablet, Take 1 tablet by mouth daily., Disp: , Rfl:  .  Omega-3 Fatty Acids (FISH OIL) 1000 MG CAPS, Take 1,000 mg by mouth daily. , Disp: , Rfl:  .  oxybutynin (DITROPAN) 5 MG tablet, Take 5 mg by mouth daily., Disp: , Rfl:  .  pantoprazole (PROTONIX) 40 MG tablet, Take 40 mg by mouth daily., Disp: , Rfl:  .  Probiotic Product (ALIGN) 4 MG CAPS, Take 1 capsule by mouth daily., Disp: , Rfl:  .  saw palmetto 160 MG capsule, Take 160 mg by mouth daily., Disp: , Rfl:  .  Tamsulosin HCl (FLOMAX) 0.4 MG CAPS, Take 0.4 mg by mouth daily., Disp: , Rfl: :  :  Allergies  Allergen Reactions  . Lyrica [Pregabalin]     unknown  . Penicillins     REACTION: Rash  . Pneumococcal Vaccine Polyvalent     REACTION: Swelling  :  Family History  Problem Relation Age of Onset  . Diabetes Father   . Colon cancer Neg Hx   :  Social History   Social History  . Marital Status: Married    Spouse Name: N/A  . Number of Children: 0  . Years of Education: N/A   Occupational History  . retired    Social History Main Topics  . Smoking status: Former Smoker    Types: Cigarettes, Pipe    Quit date: 08/14/1988  . Smokeless tobacco: Never Used  . Alcohol Use: 0.0 oz/week    0 Standard drinks or equivalent per week     Comment: rarely wine  . Drug Use: No  . Sexual Activity: Not on file   Other Topics Concern  . Not on file   Social History Narrative  :  Pertinent items are noted in HPI.  Exam: '@IPVITALS'$ @  elderly but well nourished white male in no obvious distress. His vital signs show temperature of 97.4. Pulse 65. Blood  pressure 159/64. Weight is 168 pounds. Head and neck exam shows no ocular or oral lesions. He has no palpable cervical or supraclavicular lymph nodes. Lungs are clear. Cardiac exam regular rate and rhythm with no murmurs, rubs or bruits. Abdomen is soft. He has good bowel sounds. There is no fluid wave. There is no palpable liver or spleen tip. Back exam shows some slight kyphosis. He has no tenderness over the  spine, ribs or hips. Extremities shows no clubbing, cyanosis or edema. Skin exam shows some benign-appearing skin lesions. Neurological exam shows no focal neurological deficits.    Recent Labs  07/07/15 1348  WBC 10.5*  HGB 10.7*  HCT 31.8*  PLT 398    Recent Labs  07/07/15 1348  NA 134*  K 4.2  CO2 27  GLUCOSE 95  BUN 11.9  CREATININE 0.7  CALCIUM 10.0    Blood smear review:  None  Pathology: As above     Assessment and Plan:  Sean Cooke is a very nice 81 year old white male. He has inoperable non-small cell lung cancer of the right lung. This is adenocarcinoma.  The key now is whether or not we can use an oral drug if he has any "actionable" mutations. I will have to have the lymph node biopsy sent off for Foundation One testing. Hopefully they will be oh to get this done area did  Since he has not smoked for 40 years, it is possible that he may have a mutation that we can target with an oral therapy. Para 5 talking his wife for an hour. They're very very nice.  I explained to them the situation. I explained to them that what he had was not treatable by surgery. I don't think that this can be cured without surgery. However, with appropriate therapy, he can have a response and not be bothered by any side effects.  He and his wife live very close to the [facility]. We will see about referring him to Dr. Julien Nordmann at the cancer center which make it a lot easier for him. I will oh to see him out here but it just is a lot more difficult for him to get here  then to get to the main [facility].  He clearly is asymptomatic with his disease. As such, there is not an urgency to start therapy on him. If he does need to have an MRI of the brain to make sure that he has no asymptomatic brain metastases.  I answered all their questions. They were happy that they will be okay to go to the cancer center close to their home.

## 2015-07-12 ENCOUNTER — Other Ambulatory Visit: Payer: Self-pay | Admitting: Gastroenterology

## 2015-07-12 ENCOUNTER — Telehealth: Payer: Self-pay | Admitting: *Deleted

## 2015-07-12 DIAGNOSIS — R918 Other nonspecific abnormal finding of lung field: Secondary | ICD-10-CM

## 2015-07-12 NOTE — Telephone Encounter (Signed)
Request for Foundation One to be completed on specimen at [facility] faxed.

## 2015-07-12 NOTE — Telephone Encounter (Signed)
Oncology Nurse Navigator Documentation  Oncology Nurse Navigator Flowsheets 07/12/2015  Referral date to RadOnc/MedOnc 07/12/2015  Navigator Encounter Type Introductory phone call/I received a referral from Dr. Marin Olp today on Sean Cooke.  I called and scheduled him to be seen at thoracic clinic on 07/15/15 arrive at 3:30.  Patient verbalized understanding of appt time and place.   Patient Visit Type Initial  Treatment Phase Abnormal Scans  Interventions Coordination of Care  Coordination of Care MD Appointments  Time Spent with Patient 15

## 2015-07-13 ENCOUNTER — Encounter: Payer: Self-pay | Admitting: *Deleted

## 2015-07-13 NOTE — Progress Notes (Signed)
Oncology Nurse Navigator Documentation  Oncology Nurse Navigator Flowsheets 07/13/2015  Navigator Encounter Type Other/I called [facility].  I requested pathology slides to be sent to [facility] pathology.  I faxed request to them with confirmation fax received.   Interventions Coordination of Care  Coordination of Care Other  Time Spent with Patient 30

## 2015-07-14 ENCOUNTER — Telehealth: Payer: Self-pay | Admitting: *Deleted

## 2015-07-14 ENCOUNTER — Ambulatory Visit (HOSPITAL_COMMUNITY)
Admission: RE | Admit: 2015-07-14 | Discharge: 2015-07-14 | Disposition: A | Discharge status: Home / Self Care | Payer: PPO | Source: Ambulatory Visit | Attending: Hematology & Oncology | Admitting: Hematology & Oncology

## 2015-07-14 DIAGNOSIS — I6782 Cerebral ischemia: Secondary | ICD-10-CM | POA: Diagnosis not present

## 2015-07-14 DIAGNOSIS — G319 Degenerative disease of nervous system, unspecified: Secondary | ICD-10-CM | POA: Diagnosis not present

## 2015-07-14 DIAGNOSIS — C3412 Malignant neoplasm of upper lobe, left bronchus or lung: Secondary | ICD-10-CM | POA: Diagnosis not present

## 2015-07-14 LAB — PREALBUMIN: Prealbumin: 20 mg/dL — ABNORMAL LOW (ref 21–43)

## 2015-07-14 MED ORDER — GADOBENATE DIMEGLUMINE 529 MG/ML IV SOLN
15.0000 mL | Freq: Once | INTRAVENOUS | Status: AC | PRN
  Administered 2015-07-14: 15 mL via INTRAVENOUS

## 2015-07-14 NOTE — Telephone Encounter (Signed)
Called pt and confirmed 07/15/15 clinic appt w/ him.

## 2015-07-15 ENCOUNTER — Encounter: Payer: Self-pay | Admitting: *Deleted

## 2015-07-15 ENCOUNTER — Telehealth: Payer: Self-pay | Admitting: *Deleted

## 2015-07-15 ENCOUNTER — Other Ambulatory Visit (HOSPITAL_BASED_OUTPATIENT_CLINIC_OR_DEPARTMENT_OTHER): Payer: PPO

## 2015-07-15 ENCOUNTER — Ambulatory Visit: Payer: PPO | Attending: Internal Medicine | Admitting: Physical Therapy

## 2015-07-15 ENCOUNTER — Ambulatory Visit (HOSPITAL_BASED_OUTPATIENT_CLINIC_OR_DEPARTMENT_OTHER): Payer: PPO | Admitting: Internal Medicine

## 2015-07-15 ENCOUNTER — Encounter: Payer: Self-pay | Admitting: Internal Medicine

## 2015-07-15 VITALS — BP 169/72 | HR 59 | Temp 98.2°F | Resp 18 | Ht 68.0 in | Wt 169.4 lb

## 2015-07-15 DIAGNOSIS — Z5111 Encounter for antineoplastic chemotherapy: Secondary | ICD-10-CM

## 2015-07-15 DIAGNOSIS — C342 Malignant neoplasm of middle lobe, bronchus or lung: Secondary | ICD-10-CM

## 2015-07-15 DIAGNOSIS — C770 Secondary and unspecified malignant neoplasm of lymph nodes of head, face and neck: Secondary | ICD-10-CM

## 2015-07-15 DIAGNOSIS — M545 Low back pain: Secondary | ICD-10-CM | POA: Insufficient documentation

## 2015-07-15 DIAGNOSIS — R5381 Other malaise: Secondary | ICD-10-CM | POA: Diagnosis present

## 2015-07-15 DIAGNOSIS — M4003 Postural kyphosis, cervicothoracic region: Secondary | ICD-10-CM | POA: Insufficient documentation

## 2015-07-15 DIAGNOSIS — C3411 Malignant neoplasm of upper lobe, right bronchus or lung: Secondary | ICD-10-CM | POA: Insufficient documentation

## 2015-07-15 DIAGNOSIS — R918 Other nonspecific abnormal finding of lung field: Secondary | ICD-10-CM

## 2015-07-15 HISTORY — DX: Encounter for antineoplastic chemotherapy: Z51.11

## 2015-07-15 LAB — CBC WITH DIFFERENTIAL/PLATELET
BASO%: 0.3 % (ref 0.0–2.0)
BASOS ABS: 0 10*3/uL (ref 0.0–0.1)
EOS ABS: 0.3 10*3/uL (ref 0.0–0.5)
EOS%: 2.2 % (ref 0.0–7.0)
HCT: 33.3 % — ABNORMAL LOW (ref 38.4–49.9)
HEMOGLOBIN: 11.1 g/dL — AB (ref 13.0–17.1)
LYMPH#: 1.4 10*3/uL (ref 0.9–3.3)
LYMPH%: 11.4 % — ABNORMAL LOW (ref 14.0–49.0)
MCH: 31.1 pg (ref 27.2–33.4)
MCHC: 33.4 g/dL (ref 32.0–36.0)
MCV: 93.2 fL (ref 79.3–98.0)
MONO#: 1.8 10*3/uL — ABNORMAL HIGH (ref 0.1–0.9)
MONO%: 15 % — ABNORMAL HIGH (ref 0.0–14.0)
NEUT%: 71.1 % (ref 39.0–75.0)
NEUTROS ABS: 8.5 10*3/uL — AB (ref 1.5–6.5)
Platelets: 376 10*3/uL (ref 140–400)
RBC: 3.57 10*6/uL — ABNORMAL LOW (ref 4.20–5.82)
RDW: 14.8 % — AB (ref 11.0–14.6)
WBC: 11.9 10*3/uL — ABNORMAL HIGH (ref 4.0–10.3)

## 2015-07-15 LAB — COMPREHENSIVE METABOLIC PANEL (CC13)
ALBUMIN: 3.7 g/dL (ref 3.5–5.0)
ALK PHOS: 66 U/L (ref 40–150)
ALT: 13 U/L (ref 0–55)
AST: 21 U/L (ref 5–34)
Anion Gap: 8 mEq/L (ref 3–11)
BILIRUBIN TOTAL: 0.42 mg/dL (ref 0.20–1.20)
BUN: 14.5 mg/dL (ref 7.0–26.0)
CALCIUM: 10 mg/dL (ref 8.4–10.4)
CO2: 25 mEq/L (ref 22–29)
CREATININE: 0.8 mg/dL (ref 0.7–1.3)
Chloride: 98 mEq/L (ref 98–109)
EGFR: 83 mL/min/{1.73_m2} — ABNORMAL LOW (ref >90)
Glucose: 92 mg/dl (ref 70–140)
POTASSIUM: 4.3 meq/L (ref 3.5–5.1)
Sodium: 131 mEq/L — ABNORMAL LOW (ref 136–145)
TOTAL PROTEIN: 6.8 g/dL (ref 6.4–8.3)

## 2015-07-15 NOTE — Progress Notes (Signed)
[place] [phone]   [phone] Multidisciplinary thoracic oncology clinic  CONSULT NOTE  REFERRING PHYSICIAN: Dr. Burney Gauze  REASON FOR CONSULTATION:  81 years old white male recently diagnosed with lung cancer.  HPI Sean Cooke is a 81 y.o. male with past medical history significant for GERD, hiatal hernia, diverticulosis, hypertension, degenerative disc disease as well as a remote history of smoking but quit in 1991. The patient was seen by his urologist, Dr. Karsten Ro, for no episodes of hematuria. During his evaluation he had CT of the abdomen performed on 03/05/2015 and it showed a 7 mm nonobstructive left ureteric stone but it also showed a nodule in the right middle lobe. This was followed by CT scan of the chest on 05/14/2015 and it showed 1.7 x 1.2 cm right middle lobe pulmonary nodule that appeared more confluent than on the previous study and suspicious for neoplasm. A PET scan was performed on 06/09/2015 and it showed right middle lobe pulmonary nodule that was hypermetabolic and compatible with primary bronchogenic carcinoma. There was also left supraclavicular lymph node measuring 0.9 cm and has SUV max equivalent to 4.43. Within the superior mediastinum there were 3 lymph nodes that are hypermetabolic. The index left-sided prevascular node measured 0.8 cm and has SUV max of 4.15. There was hypermetabolic right paratracheal lymph node measuring 1.0 cm and has SUV max equivalent to 5.2 and there was also increased uptake within the subcarinal lymph node and has SUV max equivalent to 3.85. There was also hypermetabolic right hilar node that has SUV max equivalent to 3.52. Assuming non-small cell histology, the current stage is T1 N3 M0 (stage IIIB). On 06/24/2015 the patient underwent ultrasound-guided core biopsy of the left cervical/supraclavicular lymph node by interventional radiology and the final pathology (Cae # [id number]) was  consistent with metastatic adenocarcinoma. The tumor cells were immunoreactive for CK 7 and TTF-1 and nonreactive for CD X2, CK 20 and PSA. The pattern of the immunoreactive 80 along with the histologic appearance is most consistent with a metastatic adenocarcinoma from the lung. The tissue block was sent for molecular study but the final result is still unavailable. The patient was seen initially by Dr. Marin Olp at the [facility]. The patient lives only a few minutes away from the main [facility] in [place] and he requested his treatment and management to be done locally close to home.  MRI of the brain on 07/14/2015 showed no evidence for metastatic disease to the brain. Dr. Marin Olp kindly referred the patient to me today for further evaluation and recommendation regarding his condition. When seen today the patient is feeling fine with no specific complaints except for the chronic back pain from degenerative disc disease and he is currently on meloxicam and Tylenol. He denied having any significant chest pain or shortness breath but has cough with no hemoptysis. The patient denied having any significant weight loss or night sweats. He denied having any hemoptysis. Family history significant for a father with diabetes mellitus. Mother died from [place] age. The patient is married and has no children. He was accompanied by his wife Sean Cooke. He is to work in [employer] . He has a history of smoking pipes for long time but quit in 1991. He drinks alcohol occasionally and no history of drug abuse.    HPI  Past Medical History  Diagnosis Date  . GERD (gastroesophageal reflux disease)   . Hiatal hernia   . Degenerative  joint disease   . Rib fracture   . Diverticulosis   . Adenomatous colon polyp 05/2003  . Duodenal diverticulum   . Neuritis/radiculitis due to displacement of lumbar intervertebral disc   . Arthritis   . Pneumonia   . Hemorrhoids     Past  Surgical History  Procedure Laterality Date  . Inguinal hernia repair    . Transurethral resection of prostate    . Splenectomy  2002  . Cataract extraction      bilateral  . Cholecystectomy  06/2003    Family History  Problem Relation Age of Onset  . Diabetes Father   . Colon cancer Neg Hx     Social History Social History  Substance Use Topics  . Smoking status: Former Smoker    Types: Cigarettes, Pipe    Quit date: 08/14/1988  . Smokeless tobacco: Never Used  . Alcohol Use: 0.0 oz/week    0 Standard drinks or equivalent per week     Comment: rarely wine    Allergies  Allergen Reactions  . Lyrica [Pregabalin]     unknown  . Penicillins     REACTION: Rash  . Pneumococcal Vaccine Polyvalent     REACTION: Swelling    Current Outpatient Prescriptions  Medication Sig Dispense Refill  . acetaminophen (TYLENOL) 650 MG CR tablet Take 650 mg by mouth as needed for pain.     Marland Kitchen aspirin 81 MG tablet Take 81 mg by mouth at bedtime.     . Calcium Carbonate-Vitamin D (CALCIUM + D PO) Take 2 capsules by mouth daily.    . cromolyn (OPTICROM) 4 % ophthalmic solution Administer 1 drop to both eyes Four (4) times a day.    . denosumab (PROLIA) 60 MG/ML SOLN injection Inject 60 mg into the skin every 6 (six) months. Administer in upper arm, thigh, or abdomen    . dextromethorphan-guaiFENesin (ROBITUSSIN-DM) 10-100 MG/5ML liquid Take 10 mLs by mouth every 4 (four) hours as needed for cough.    . Diphenhyd-Hydrocort-Nystatin (FIRST-DUKES MOUTHWASH MT) Use as directed 10 mLs in the mouth or throat 3 (three) times daily.    Sean Cooke (GLUCOSAMINE MSM COMPLEX) TABS Take 2 tablets by mouth daily.    . Glucosamine-Chondroitin 750-600 MG TABS Take by mouth.    . hydrocortisone (PROCTOSOL HC) 2.5 % rectal cream Place 1 application rectally 2 (two) times daily.    Marland Kitchen lactobacillus acidophilus & bulgar (LACTINEX) chewable tablet Chew 1 tablet by mouth 2 (two) times daily.  Store in [employer] [place]    . lactulose (CHRONULAC) 10 GM/15ML solution TAKE 15 MLS ONCE DAILY AS DIRECTED 473 mL 0  . levofloxacin (LEVAQUIN) 750 MG tablet Take 1 tablet (750 mg total) by mouth daily. 5 tablet 0  . loratadine (CLARITIN) 10 MG tablet Take 10 mg by mouth.    . losartan (COZAAR) 50 MG tablet Take 50 mg by mouth daily.    . meloxicam (MOBIC) 7.5 MG tablet Take 7.5 mg by mouth daily.    . Multiple Vitamin (MULTIVITAMIN) tablet Take 1 tablet by mouth daily.    Marland Kitchen oxybutynin (DITROPAN) 5 MG tablet Take 5 mg by mouth daily.    . pantoprazole (PROTONIX) 40 MG tablet Take 40 mg by mouth daily.    . Probiotic Product (ALIGN) 4 MG CAPS Take 1 capsule by mouth daily.    . saw palmetto 160 MG capsule Take 160 mg by mouth daily.    . Tamsulosin HCl (FLOMAX) 0.4 MG CAPS Take 0.4  mg by mouth daily.    . benzonatate (TESSALON) 100 MG capsule Take 1 capsule (100 mg total) by mouth 3 (three) times daily as needed for cough. (Patient not taking: Reported on 07/15/2015) 20 capsule 0  . Omega-3 Fatty Acids (FISH OIL) 1000 MG CAPS Take 1,000 mg by mouth daily.      No current facility-administered medications for this visit.    Review of Systems  Constitutional: negative Eyes: negative Ears, nose, mouth, throat, and face: negative Respiratory: positive for cough Cardiovascular: negative Gastrointestinal: negative Genitourinary:negative Integument/breast: negative Hematologic/lymphatic: negative Musculoskeletal:positive for back pain Neurological: negative Behavioral/Psych: negative Endocrine: negative Allergic/Immunologic: negative  Physical Exam  BWL:SLHTD, healthy, no distress, well nourished and well developed SKIN: skin color, texture, turgor are normal, no rashes or significant lesions HEAD: Normocephalic, No masses, lesions, tenderness or abnormalities EYES: normal, PERRLA, Conjunctiva are pink and non-injected EARS: External ears normal, Canals clear OROPHARYNX:no exudate, no  erythema and lips, buccal mucosa, and tongue normal  NECK: supple, no adenopathy, no JVD LYMPH:  no palpable lymphadenopathy, no hepatosplenomegaly LUNGS: clear to auscultation , and palpation HEART: regular rate & rhythm, no murmurs and no gallops ABDOMEN:abdomen soft, non-tender, normal bowel sounds and no masses or organomegaly BACK: Back symmetric, no curvature., No CVA tenderness EXTREMITIES:no joint deformities, effusion, or inflammation, no edema, no skin discoloration  NEURO: alert & oriented x 3 with fluent speech, no focal motor/sensory deficits  PERFORMANCE STATUS: ECOG 1  LABORATORY DATA: Lab Results  Component Value Date   WBC 11.9* 07/15/2015   HGB 11.1* 07/15/2015   HCT 33.3* 07/15/2015   MCV 93.2 07/15/2015   PLT 376 07/15/2015      Chemistry      Component Value Date/Time   NA 131* 07/15/2015 1534   NA 130* 10/07/2014 0535   K 4.3 07/15/2015 1534   K 4.2 10/07/2014 0535   CL 97 10/07/2014 0535   CO2 25 07/15/2015 1534   CO2 28 10/07/2014 0535   BUN 14.5 07/15/2015 1534   BUN 6 10/07/2014 0535   CREATININE 0.8 07/15/2015 1534   CREATININE 0.52 10/07/2014 0535      Component Value Date/Time   CALCIUM 10.0 07/15/2015 1534   CALCIUM 8.2* 10/07/2014 0535   ALKPHOS 66 07/15/2015 1534   ALKPHOS 56 10/05/2014 0424   AST 21 07/15/2015 1534   AST 38* 10/05/2014 0424   ALT 13 07/15/2015 1534   ALT 17 10/05/2014 0424   BILITOT 0.42 07/15/2015 1534   BILITOT 0.8 10/05/2014 0424       RADIOGRAPHIC STUDIES: Mr Jeri Cos Wo Contrast  19-Jul-2015  CLINICAL DATA:  Lung cancer staging EXAM: MRI HEAD WITHOUT AND WITH CONTRAST TECHNIQUE: Multiplanar, multiecho pulse sequences of the brain and surrounding structures were obtained without and with intravenous contrast. CONTRAST:  72m MULTIHANCE GADOBENATE DIMEGLUMINE 529 MG/ML IV SOLN COMPARISON:  None. FINDINGS: Mild generalized atrophy.  Negative for hydrocephalus. Negative for acute infarct. Mild chronic  microvascular ischemic changes in the white matter. Negative for intracranial hemorrhage.  Negative for mass or edema. Postcontrast imaging reveals normal enhancement. No enhancing mass lesion. Leptomeningeal enhancement normal. Normal vascular enhancement. Pituitary normal. No calvarial lesion. Normal orbit. Paranasal sinuses clear. IMPRESSION: Negative for metastatic disease to the brain. Atrophy and mild chronic microvascular ischemia. No acute abnormality. Electronically Signed   By: CFranchot GalloM.D.   On: [id number]    ASSESSMENT: This is a very pleasant 81years old white male recently diagnosed with a stage IIIB (T1a, N3,  M0) non-small cell lung cancer, adenocarcinoma diagnosed in November 2016 and presented with right middle lobe lung nodule in addition to mediastinal and left supraclavicular lymphadenopathy.   PLAN: I had a lengthy discussion with the patient and his wife today about his current condition and treatment options. His case was discussed at the weekly thoracic conference earlier today and Dr. Pablo Ledger from radiation oncology felt that the field of radiation will so large with significant toxicity and she recommended against concurrent chemoradiation at this point. I discussed with the patient other treatment options for his condition including palliative care and hospice referral versus consideration of treatment with systemic chemotherapy with carboplatin and Alimta versus treatment with repeat therapy if the patient has positive EGFR mutation or ALK gene translocation versus treatment with immunotherapy with Nat Math (pembrolizumab) if he has PDL 1 expression over 50%. His tissue block was sent for molecular studies but I'm not sure if we would have enough tissue to do the PDL 1 test. The patient would like to wait until the molecular studies become available before making a decision regarding treatment. We will call the patient for a follow-up appointment once the  molecular studies are available which is expected to be resulted within the next 7-10 days. For back pain the patient will continue his current treatment with meloxicam and Tylenol The patient was advised to call immediately if he has any concerning symptoms in the interval. The patient was seen during the multidisciplinary thoracic oncology clinic today by medical oncology, thoracic navigator, social worker and physical therapist. The patient voices understanding of current disease status and treatment options and is in agreement with the current care plan.  All questions were answered. The patient knows to call the clinic with any problems, questions or concerns. We can certainly see the patient much sooner if necessary.  Thank you so much for allowing me to participate in the care of Sean Cooke. I will continue to follow up the patient with you and assist in his care.  I spent 55 minutes counseling the patient face to face. The total time spent in the appointment was 80 minutes.  Disclaimer: This note was dictated with voice recognition software. Similar sounding words can inadvertently be transcribed and may not be corrected upon review.   Devrin Monforte K. July 15, 2015, 4:50 PM

## 2015-07-15 NOTE — Therapy (Signed)
[place], [place], [address] Phone: [phone]   Fax:  [phone]  Physical Therapy Evaluation  Patient Details  Name: Sean Cooke MRN: [id number] Date of Birth: September 20, 1926 Referring Provider: Dr. Curt Bears  Encounter Date: 07/15/2015      PT End of Session - 07/15/15 1729    Visit Number 1   Number of Visits 1   PT Start Time 8588   PT Stop Time 1713   PT Time Calculation (min) 23 min   Activity Tolerance Patient tolerated treatment well   Behavior During Therapy [facility] for tasks assessed/performed      Past Medical History  Diagnosis Date  . GERD (gastroesophageal reflux disease)   . Hiatal hernia   . Degenerative joint disease   . Rib fracture   . Diverticulosis   . Adenomatous colon polyp 05/2003  . Duodenal diverticulum   . Neuritis/radiculitis due to displacement of lumbar intervertebral disc   . Arthritis   . Pneumonia   . Hemorrhoids     Past Surgical History  Procedure Laterality Date  . Inguinal hernia repair    . Transurethral resection of prostate    . Splenectomy  2002  . Cataract extraction      bilateral  . Cholecystectomy  06/2003    There were no vitals filed for this visit.  Visit Diagnosis:  Postural kyphosis of cervicothoracic region - Plan: PT plan of care cert/re-cert  Physical debility - Plan: PT plan of care cert/re-cert  Low back pain without sciatica, unspecified back pain laterality - Plan: PT plan of care cert/re-cert      Subjective Assessment - 07/15/15 1716    Subjective Has episodic low back pain which has acted up today.   Patient is accompained by: Family member  wife   Pertinent History Patient presented with back pain and work up showed right middle lobe nodule.  Biopsy showed adenocarcinoma, at least stage IIIB. Brain MRI was negative.  Expected to have chemotherapy or an oral biologic agent, depending on genetic studies.   Ex-smoker who quit 1990; leukocytosis; arthritis; lumbar DDD.   Patient Stated Goals get info from all lung clinic providers   Currently in Pain? Yes   Pain Score 7    Pain Location Back   Pain Orientation Lower   Pain Descriptors / Indicators Other (Comment)  comes and goes   Pain Frequency Intermittent   Aggravating Factors  carrying things   Pain Relieving Factors heating pad, Blue Emu cream, Tylenol arthritis strength, Meloxicam            [facility] PT Assessment - 07/15/15 0001    Assessment   Medical Diagnosis right middle lobe adenocarcinoma, at least stage IIIB   Referring Provider Dr. Curt Bears   Precautions   Precautions Other (comment)   Precaution Comments cancer precautions   Restrictions   Weight Bearing Restrictions No   Balance Screen   Has the patient fallen in the past 6 months No   Has the patient had a decrease in activity level because of a fear of falling?  No   Is the patient reluctant to leave their home because of a fear of falling?  No   Home Environment   Living Environment Private residence   Living Arrangements Spouse/significant other   Type of [place] to enter   Entrance Stairs-Number of Steps 6   Entrance Stairs-Rails --  has railing  Home Layout One level   Prior Function   Level of Independence Independent   Leisure no regular exercise   Cognition   Overall Cognitive Status Within Functional Limits for tasks assessed   Observation/Other Assessments   Observations Pleasant older gentleman who is fatigued today    Functional Tests   Functional tests Sit to Stand   Sit to Stand   Comments 9 times in 30 seconds, sometimes standing only partway up  back pain limiting to some extent; mild SOB afterwards   Posture/Postural Control   Posture/Postural Control Postural limitations   Postural Limitations Rounded Shoulders;Forward head  significant   ROM / Strength   AROM / PROM / Strength AROM   AROM   Overall  AROM Comments trunk AROM in flexion, extension, and sidebending bilaterally limited 50-75%, partly due to current episode of back pain; rotation not tested   Ambulation/Gait   Ambulation/Gait Yes   Assistive device None   Balance   Balance Assessed Yes   Dynamic Standing Balance   Dynamic Standing - Comments reaches 10 inches forward in standing, quite slowly   below average for his age                           PT Education - 2015-08-02 1729    Education provided Yes   Education Details energy conservation, walking, staying active article from [employer], posture, breathing, PT info   Person(s) Educated Patient;Spouse   Methods Explanation;Demonstration;Handout   Comprehension Verbalized understanding               Lung Clinic Goals - 02-Aug-2015 1733    Patient will be able to verbalize understanding of the benefit of exercise to decrease fatigue.   Status Achieved   Patient will be able to verbalize the importance of posture.   Status Achieved   Patient will be able to demonstrate diaphragmatic breathing for improved lung function.   Status Achieved   Patient will be able to verbalize understanding of the role of physical therapy to prevent functional decline and who to contact if physical therapy is needed.   Status Achieved             Plan - 08/02/2015 1730    Clinical Impression Statement Pleasant gentleman with low back pain, poor posture, and overall stiffness, now with a lung cancer diagnosis and expecting either chemotherapy or oral biologic treatment.   Pt will benefit from skilled therapeutic intervention in order to improve on the following deficits Postural dysfunction;Pain;Decreased range of motion;Decreased mobility   Rehab Potential Good   PT Frequency One time visit   PT Treatment/Interventions Patient/family education   PT Next Visit Plan None at this time   PT Home Exercise Plan see education section   Consulted and Agree with Plan of  Care Patient          G-Codes - 2015/08/02 1733    Functional Assessment Tool Used clinical judgement   Functional Limitation Changing and maintaining body position   Changing and Maintaining Body Position Current Status (E2683) At least 40 percent but less than 60 percent impaired, limited or restricted   Changing and Maintaining Body Position Goal Status (M1962) At least 40 percent but less than 60 percent impaired, limited or restricted   Changing and Maintaining Body Position Discharge Status (I2979) At least 40 percent but less than 60 percent impaired, limited or restricted       Problem List Patient Active Problem  List   Diagnosis Date Noted  . Malignant neoplasm of right upper lobe of lung ([place]) 07/15/2015  . Lung mass 07/12/2015  . Hyponatremia 10/07/2014  . Community acquired bacterial pneumonia 10/04/2014  . Leukocytosis 10/04/2014  . Essential hypertension 10/04/2014  . BPH (benign prostatic hyperplasia) 10/04/2014  . Abdominal pain, LLQ (left lower quadrant) 04/03/2014  . Constipation 08/20/2013  . Lactose intolerance 08/20/2013  . GERD 04/27/2008  . THROMBOCYTHEMIA 04/23/2008  . Normocytic anemia 04/23/2008  . SPONDYLOSIS, LUMBAR 04/23/2008  . NEURITIS, LUMBOSACRAL 04/23/2008  . OSTEOPENIA 04/23/2008  . ASPLENIA 04/23/2008  . URINARY FREQUENCY 04/23/2008  . COLONIC POLYPS, ADENOMATOUS, HX OF 04/23/2008    Alyson Ki 07/15/2015, 5:35 PM  [place], [place], [address] Phone: [phone]   Fax:  [phone]  Name: ELIEL DUDDING MRN: [id number] Date of Birth: 01/31/1927   Serafina Royals, PT 07/15/2015 5:35 PM

## 2015-07-15 NOTE — Telephone Encounter (Addendum)
Patient aware of results  ----- Message from Volanda Napoleon, MD sent at 07/14/2015  5:41 PM EST ----- Call - brain looks ok!!  No cancer up there!!  pete

## 2015-07-15 NOTE — Progress Notes (Signed)
[facility] Clinical Social Work  Clinical Social Work met with patient/family at [employer] appointment to offer support and assess for psychosocial needs.  The patient was accompanied by his spouse The patient scored a 7 on the Psychosocial Distress Thermometer which indicates moderate distress. Sean Cooke shared his only cause of distress is physical symptoms that were shared with medical oncologist at today's visit and adjusting to his illness.  He shared he is eager to know results of biopsy and determine plan for treatment.    ONCBCN DISTRESS SCREENING 07/15/2015  Screening Type Initial Screening  Distress experienced in past week (1-10) 7  Emotional problem type Adjusting to illness  Physical Problem type Pain;Constipation/diarrhea  Referral to clinical social work Yes     Clinical Social Work briefly discussed Clinical Social Work role and [employer] support programs/services.  Clinical Social Work encouraged patient to call with any additional questions or concerns.   [name], MSW, LCSW, OSW-C Clinical Social Worker [facility] [phone]

## 2015-07-15 NOTE — Progress Notes (Signed)
I received a call from [facility] and there is not a tissue block to send, only slides.  I will notify pathology and Dr. Julien Nordmann.

## 2015-07-16 ENCOUNTER — Encounter: Payer: Self-pay | Admitting: *Deleted

## 2015-07-16 NOTE — Progress Notes (Signed)
Oncology Nurse Navigator Documentation  Oncology Nurse Navigator Flowsheets 07/16/2015  Navigator Encounter Type Other/per Dr. Worthy Flank request, I called [facility] to see if they would fax results to Dr. Julien Nordmann. [facility] will not fax results to Dr. Julien Nordmann.  I called Dr. Antonieta Pert office to let them know [facility] results will be completed between 12/12 and 12/15.  I left a vm message with this information and for them to fax to [place] when they receive.  I also gave them my name phone number and fax number.   Treatment Phase Abnormal Scans  Interventions Coordination of Care  Coordination of Care Other  Time Spent with Patient 45

## 2015-07-17 ENCOUNTER — Other Ambulatory Visit: Payer: Self-pay | Admitting: Gastroenterology

## 2015-07-19 ENCOUNTER — Encounter: Payer: Self-pay | Admitting: *Deleted

## 2015-07-21 ENCOUNTER — Encounter: Payer: Self-pay | Admitting: *Deleted

## 2015-07-21 ENCOUNTER — Telehealth: Payer: Self-pay | Admitting: *Deleted

## 2015-07-26 ENCOUNTER — Encounter: Payer: Self-pay | Admitting: *Deleted

## 2015-07-26 NOTE — Progress Notes (Signed)
Oncology Nurse Navigator Documentation  Oncology Nurse Navigator Flowsheets 07/26/2015  Navigator Encounter Type Other/I checked on pathology slides sent to [facility].  [facility] has still not received them.  I called [facility] and it was sent out 07/15/15.  I was given a tracking number for [employer] to track where the slides are.  I tracked the slides and it was delivered on 07/16/15 but unsure where.  I called [facility] and they will look for the slides.  I asked at the cancer center if they have seen them and no one has seen.   I also called Dr. Lavona Mound office to get [facility] results.  They are not back but that office will fax me results when available.   Patient Visit Type -  Treatment Phase Abnormal Scans  Interventions Coordination of Care  Coordination of Care -  Time Spent with Patient 45

## 2015-07-27 ENCOUNTER — Encounter (HOSPITAL_COMMUNITY): Payer: Self-pay

## 2015-07-28 ENCOUNTER — Encounter: Payer: Self-pay | Admitting: *Deleted

## 2015-07-28 ENCOUNTER — Telehealth: Payer: Self-pay | Admitting: *Deleted

## 2015-07-28 ENCOUNTER — Telehealth: Payer: Self-pay | Admitting: Internal Medicine

## 2015-07-28 DIAGNOSIS — C3411 Malignant neoplasm of upper lobe, right bronchus or lung: Secondary | ICD-10-CM

## 2015-07-28 NOTE — Telephone Encounter (Signed)
Added lab/fu for 12/16. Date/time/patient aware per 12/14 pof.

## 2015-07-28 NOTE — Telephone Encounter (Signed)
Dr. Julien Nordmann would like to see patient 07/30/15 at 8:30.  I spoke with patient and he is aware of appt time and place.

## 2015-07-28 NOTE — Progress Notes (Signed)
Oncology Nurse Navigator Documentation  Oncology Nurse Navigator Flowsheets 07/28/2015  Navigator Encounter Type Other/I called [facility] to receive [facility] results.  Placed on Dr. Worthy Flank desk.    Treatment Phase Abnormal Scans  Interventions Coordination of Care  Time Spent with Patient 30

## 2015-07-29 ENCOUNTER — Ambulatory Visit: Payer: PPO | Admitting: Internal Medicine

## 2015-07-29 ENCOUNTER — Other Ambulatory Visit: Payer: PPO

## 2015-07-30 ENCOUNTER — Encounter: Payer: Self-pay | Admitting: Internal Medicine

## 2015-07-30 ENCOUNTER — Encounter: Payer: Self-pay | Admitting: *Deleted

## 2015-07-30 ENCOUNTER — Ambulatory Visit (HOSPITAL_BASED_OUTPATIENT_CLINIC_OR_DEPARTMENT_OTHER): Payer: PPO | Admitting: Internal Medicine

## 2015-07-30 ENCOUNTER — Telehealth: Payer: Self-pay | Admitting: Internal Medicine

## 2015-07-30 ENCOUNTER — Other Ambulatory Visit (HOSPITAL_BASED_OUTPATIENT_CLINIC_OR_DEPARTMENT_OTHER): Payer: PPO

## 2015-07-30 ENCOUNTER — Telehealth: Payer: Self-pay | Admitting: *Deleted

## 2015-07-30 VITALS — BP 172/67 | HR 68 | Temp 97.8°F | Resp 18 | Ht 68.0 in | Wt 169.1 lb

## 2015-07-30 DIAGNOSIS — C3411 Malignant neoplasm of upper lobe, right bronchus or lung: Secondary | ICD-10-CM

## 2015-07-30 DIAGNOSIS — C3491 Malignant neoplasm of unspecified part of right bronchus or lung: Secondary | ICD-10-CM

## 2015-07-30 DIAGNOSIS — Z5111 Encounter for antineoplastic chemotherapy: Secondary | ICD-10-CM

## 2015-07-30 LAB — CBC WITH DIFFERENTIAL/PLATELET
BASO%: 0.4 % (ref 0.0–2.0)
Basophils Absolute: 0.1 10*3/uL (ref 0.0–0.1)
EOS ABS: 0.3 10*3/uL (ref 0.0–0.5)
EOS%: 1.8 % (ref 0.0–7.0)
HCT: 35.1 % — ABNORMAL LOW (ref 38.4–49.9)
HGB: 11.5 g/dL — ABNORMAL LOW (ref 13.0–17.1)
LYMPH%: 6.9 % — AB (ref 14.0–49.0)
MCH: 30.4 pg (ref 27.2–33.4)
MCHC: 32.7 g/dL (ref 32.0–36.0)
MCV: 93.1 fL (ref 79.3–98.0)
MONO#: 2 10*3/uL — ABNORMAL HIGH (ref 0.1–0.9)
MONO%: 12.4 % (ref 0.0–14.0)
NEUT%: 78.5 % — ABNORMAL HIGH (ref 39.0–75.0)
NEUTROS ABS: 12.5 10*3/uL — AB (ref 1.5–6.5)
Platelets: 426 10*3/uL — ABNORMAL HIGH (ref 140–400)
RBC: 3.77 10*6/uL — AB (ref 4.20–5.82)
RDW: 14.1 % (ref 11.0–14.6)
WBC: 16 10*3/uL — AB (ref 4.0–10.3)
lymph#: 1.1 10*3/uL (ref 0.9–3.3)

## 2015-07-30 LAB — COMPREHENSIVE METABOLIC PANEL
ALT: 14 U/L (ref 0–55)
AST: 20 U/L (ref 5–34)
Albumin: 3.6 g/dL (ref 3.5–5.0)
Alkaline Phosphatase: 68 U/L (ref 40–150)
Anion Gap: 6 mEq/L (ref 3–11)
BILIRUBIN TOTAL: 0.53 mg/dL (ref 0.20–1.20)
BUN: 12.8 mg/dL (ref 7.0–26.0)
CO2: 27 meq/L (ref 22–29)
Calcium: 10.1 mg/dL (ref 8.4–10.4)
Chloride: 101 mEq/L (ref 98–109)
Creatinine: 0.7 mg/dL (ref 0.7–1.3)
EGFR: 86 mL/min/{1.73_m2} — AB (ref >90)
GLUCOSE: 100 mg/dL (ref 70–140)
POTASSIUM: 4.2 meq/L (ref 3.5–5.1)
SODIUM: 135 meq/L — AB (ref 136–145)
TOTAL PROTEIN: 6.8 g/dL (ref 6.4–8.3)

## 2015-07-30 MED ORDER — CYANOCOBALAMIN 1000 MCG/ML IJ SOLN
1000.0000 ug | Freq: Once | INTRAMUSCULAR | Status: AC
  Administered 2015-07-30: 1000 ug via INTRAMUSCULAR

## 2015-07-30 MED ORDER — FOLIC ACID 1 MG PO TABS: 1.0000 mg | ORAL_TABLET | Freq: Every day | ORAL | Status: DC

## 2015-07-30 MED ORDER — CYANOCOBALAMIN 1000 MCG/ML IJ SOLN
INTRAMUSCULAR | Status: AC
  Filled 2015-07-30: qty 1

## 2015-07-30 MED ORDER — PROCHLORPERAZINE MALEATE 10 MG PO TABS: 10.0000 mg | ORAL_TABLET | Freq: Four times a day (QID) | ORAL | Status: DC | PRN

## 2015-07-30 MED ORDER — DEXAMETHASONE 4 MG PO TABS: ORAL_TABLET | ORAL | Status: DC

## 2015-07-30 NOTE — Progress Notes (Signed)
[facility] [phone]   [phone]  OFFICE PROGRESS NOTE  Sean Coma., MD [address] [place] [address]  DIAGNOSIS: stage IIIB (T1a, N3, M0) non-small cell lung cancer, adenocarcinoma with negative EGFR mutation, negative ALKgene translocation, negative ROS 1, but positive RET diagnosed in November 2016 and presented with right middle lobe lung nodule in addition to mediastinal and left supraclavicular lymphadenopathy.  PRIOR THERAPY: None  CURRENT THERAPY: Systemic chemotherapy with carboplatin for AUC of 5 and Alimta 500 MG/M2 every 3 weeks, first dose 08/11/2015.  INTERVAL HISTORY: Sean Cooke 81 y.o. male returns to the clinic today for follow-up visit accompanied by his wife. The patient is feeling fine today was no specific complaints except for the chronic back pain. He denied having any significant chest pain but continues to have mild shortness breath with exertion with no cough or hemoptysis. He has no significant weight loss or night sweats. He has no nausea or vomiting, no fever or chills. He had molecular studies that showed no EGFR mutation or ALK gene translocation. It showed positive RET fusion. PDL 1 expression with SP142 antibody showed 10% expression. He is here today for evaluation and discussion of his treatment options.  MEDICAL HISTORY: Past Medical History  Diagnosis Date  . GERD (gastroesophageal reflux disease)   . Hiatal hernia   . Degenerative joint disease   . Rib fracture   . Diverticulosis   . Adenomatous colon polyp 05/2003  . Duodenal diverticulum   . Neuritis/radiculitis due to displacement of lumbar intervertebral disc   . Arthritis   . Pneumonia   . Hemorrhoids     ALLERGIES:  is allergic to lyrica; penicillins; and pneumococcal vaccine polyvalent.  MEDICATIONS:  Current Outpatient Prescriptions  Medication Sig Dispense Refill  . acetaminophen (TYLENOL) 650 MG CR tablet  Take 650 mg by mouth as needed for pain.     Marland Kitchen aspirin 81 MG tablet Take 81 mg by mouth at bedtime.     . benzonatate (TESSALON) 100 MG capsule Take 1 capsule (100 mg total) by mouth 3 (three) times daily as needed for cough. (Patient not taking: Reported on 07/15/2015) 20 capsule 0  . Calcium Carbonate-Vitamin D (CALCIUM + D PO) Take 2 capsules by mouth daily.    . cromolyn (OPTICROM) 4 % ophthalmic solution Administer 1 drop to both eyes Four (4) times a day.    . denosumab (PROLIA) 60 MG/ML SOLN injection Inject 60 mg into the skin every 6 (six) months. Administer in upper arm, thigh, or abdomen    . dextromethorphan-guaiFENesin (ROBITUSSIN-DM) 10-100 MG/5ML liquid Take 10 mLs by mouth every 4 (four) hours as needed for cough.    . Diphenhyd-Hydrocort-Nystatin (FIRST-DUKES MOUTHWASH MT) Use as directed 10 mLs in the mouth or throat 3 (three) times daily.    Donnie Aho (GLUCOSAMINE MSM COMPLEX) TABS Take 2 tablets by mouth daily.    . Glucosamine-Chondroitin 750-600 MG TABS Take by mouth.    . hydrocortisone (PROCTOSOL HC) 2.5 % rectal cream Place 1 application rectally 2 (two) times daily.    Marland Kitchen lactobacillus acidophilus & bulgar (LACTINEX) chewable tablet Chew 1 tablet by mouth 2 (two) times daily. Store in [employer] [place]    . lactulose (CHRONULAC) 10 GM/15ML solution TAKE 15 MLS ONCE DAILY AS DIRECTED 473 mL 0  . levofloxacin (LEVAQUIN) 750 MG tablet Take 1 tablet (750 mg total) by mouth daily. 5 tablet 0  . loratadine (CLARITIN) 10  MG tablet Take 10 mg by mouth.    . losartan (COZAAR) 50 MG tablet Take 50 mg by mouth daily.    . meloxicam (MOBIC) 7.5 MG tablet Take 7.5 mg by mouth daily.    . Multiple Vitamin (MULTIVITAMIN) tablet Take 1 tablet by mouth daily.    . Omega-3 Fatty Acids (FISH OIL) 1000 MG CAPS Take 1,000 mg by mouth daily.     Marland Kitchen oxybutynin (DITROPAN) 5 MG tablet Take 5 mg by mouth daily.    . pantoprazole (PROTONIX) 40 MG tablet Take 40 mg by mouth daily.      . Probiotic Product (ALIGN) 4 MG CAPS Take 1 capsule by mouth daily.    . saw palmetto 160 MG capsule Take 160 mg by mouth daily.    . Tamsulosin HCl (FLOMAX) 0.4 MG CAPS Take 0.4 mg by mouth daily.     No current facility-administered medications for this visit.    SURGICAL HISTORY:  Past Surgical History  Procedure Laterality Date  . Inguinal hernia repair    . Transurethral resection of prostate    . Splenectomy  2002  . Cataract extraction      bilateral  . Cholecystectomy  06/2003    REVIEW OF SYSTEMS:  Constitutional: positive for fatigue Eyes: negative Ears, nose, mouth, throat, and face: negative Respiratory: positive for dyspnea on exertion Cardiovascular: negative Gastrointestinal: negative Genitourinary:negative Integument/breast: negative Hematologic/lymphatic: negative Musculoskeletal:positive for back pain Neurological: negative Behavioral/Psych: negative Endocrine: negative Allergic/Immunologic: negative   PHYSICAL EXAMINATION: General appearance: alert, cooperative, fatigued and no distress Head: Normocephalic, without obvious abnormality, atraumatic Neck: no adenopathy, no JVD, supple, symmetrical, trachea midline and thyroid not enlarged, symmetric, no tenderness/mass/nodules Lymph nodes: Cervical, supraclavicular, and axillary nodes normal. Resp: clear to auscultation bilaterally Back: symmetric, no curvature. ROM normal. No CVA tenderness. Cardio: regular rate and rhythm, S1, S2 normal, no murmur, click, rub or gallop GI: soft, non-tender; bowel sounds normal; no masses,  no organomegaly Extremities: extremities normal, atraumatic, no cyanosis or edema Neurologic: Alert and oriented X 3, normal strength and tone. Normal symmetric reflexes. Normal coordination and gait  ECOG PERFORMANCE STATUS: 1 - Symptomatic but completely ambulatory  Blood pressure 172/67, pulse 68, temperature 97.8 F (36.6 C), temperature source Oral, resp. rate 18, height 5'  8" (1.727 m), weight 169 lb 1.6 oz (76.703 kg), SpO2 99 %.  LABORATORY DATA: Lab Results  Component Value Date   WBC 16.0* 07/30/2015   HGB 11.5* 07/30/2015   HCT 35.1* 07/30/2015   MCV 93.1 07/30/2015   PLT 426* 07/30/2015      Chemistry      Component Value Date/Time   NA 131* 07/15/2015 1534   NA 130* 10/07/2014 0535   K 4.3 07/15/2015 1534   K 4.2 10/07/2014 0535   CL 97 10/07/2014 0535   CO2 25 07/15/2015 1534   CO2 28 10/07/2014 0535   BUN 14.5 07/15/2015 1534   BUN 6 10/07/2014 0535   CREATININE 0.8 07/15/2015 1534   CREATININE 0.52 10/07/2014 0535      Component Value Date/Time   CALCIUM 10.0 07/15/2015 1534   CALCIUM 8.2* 10/07/2014 0535   ALKPHOS 66 07/15/2015 1534   ALKPHOS 56 10/05/2014 0424   AST 21 07/15/2015 1534   AST 38* 10/05/2014 0424   ALT 13 07/15/2015 1534   ALT 17 10/05/2014 0424   BILITOT 0.42 07/15/2015 1534   BILITOT 0.8 10/05/2014 0424       RADIOGRAPHIC STUDIES: Mr Jeri Cos AG Contrast  07/14/2015  CLINICAL DATA:  Lung cancer staging EXAM: MRI HEAD WITHOUT AND WITH CONTRAST TECHNIQUE: Multiplanar, multiecho pulse sequences of the brain and surrounding structures were obtained without and with intravenous contrast. CONTRAST:  41m MULTIHANCE GADOBENATE DIMEGLUMINE 529 MG/ML IV SOLN COMPARISON:  None. FINDINGS: Mild generalized atrophy.  Negative for hydrocephalus. Negative for acute infarct. Mild chronic microvascular ischemic changes in the white matter. Negative for intracranial hemorrhage.  Negative for mass or edema. Postcontrast imaging reveals normal enhancement. No enhancing mass lesion. Leptomeningeal enhancement normal. Normal vascular enhancement. Pituitary normal. No calvarial lesion. Normal orbit. Paranasal sinuses clear. IMPRESSION: Negative for metastatic disease to the brain. Atrophy and mild chronic microvascular ischemia. No acute abnormality. Electronically Signed   By: CFranchot GalloM.D.   On: 07/14/2015 16:56    ASSESSMENT  AND PLAN: This is a very pleasant 81years old white male with a stage IIIB non-small cell lung cancer with negative EGFR mutation, negative ALK gene translocation and PDL 1 expression 10%. The patient is not eligible for concurrent chemoradiation because of the large radiotherapy field. I had a lengthy discussion with the patient and his wife today about his current disease stage, prognosis and treatment options. I gave the patient the option of systemic chemotherapy versus palliative care and hospice referral.  I also discussed with the patient treatment with RET inhibitor but unfortunately there is no FDA approved first line option in lung cancer for this patient at this point. The patient is interested in systemic treatment. I recommended for him chemotherapy in the form of carboplatin for AUC of 5 and Alimta 500 MG/M2 every 3 weeks I discussed with the patient adverse effects of this treatment including but not limited to alopecia, myelosuppression, nausea and vomiting, peripheral neuropathy, liver or renal dysfunction. We will arrange for the patient to receive vitamin B 12 injection today. The patient would also receive prescription for Compazine 10 mg by mouth every 6 hours as needed for nausea, Decadron 4 mg by mouth twice a day, the day before, day of and day after the chemotherapy in addition to folic acid 1 mg by mouth daily. I will arrange for the patient to have a chemotherapy education class before starting the first dose of the chemotherapy. He is expected to start the first dose of this treatment on 08/11/2015. The patient would come back for follow-up visit in 2 weeks for reevaluation and management of any adverse effect of his treatment. He was advised to call immediately if he has any concerning symptoms in the interval. The patient voices understanding of current disease status and treatment options and is in agreement with the current care plan.  All questions were answered. The  patient knows to call the clinic with any problems, questions or concerns. We can certainly see the patient much sooner if necessary.  I spent 20 minutes counseling the patient face to face. The total time spent in the appointment was 30 minutes.  Disclaimer: This note was dictated with voice recognition software. Similar sounding words can inadvertently be transcribed and may not be corrected upon review.

## 2015-07-30 NOTE — Telephone Encounter (Signed)
Gave and printed appt sched and avs for pt; for DEC and Jan  °

## 2015-07-30 NOTE — Telephone Encounter (Signed)
Per staff message and POF I have scheduled appts. Advised scheduler of appts. JMW  

## 2015-07-31 ENCOUNTER — Encounter: Payer: Self-pay | Admitting: Internal Medicine

## 2015-07-31 DIAGNOSIS — Z5111 Encounter for antineoplastic chemotherapy: Secondary | ICD-10-CM | POA: Insufficient documentation

## 2015-08-02 ENCOUNTER — Telehealth: Payer: Self-pay | Admitting: *Deleted

## 2015-08-02 NOTE — Telephone Encounter (Signed)
Call received in [place] from patient wanting to verify " when to start these medications I picked up this weekend per Dr Earlie Server ".  Pt states he picked up folic acid, dexamethasone and compazine.  Per phone discussion- Jaquin verbalized understanding to start the folic acid now, Dexamethasone is to start the day prior to plan IV chemo. Compazine is to be used post chemo if needed.  Patient also stated concern due to " letter I received from my insurance company stating a bill for $525 from your office has been denied " Bertran states he contacted his local insurance office and was told " it was a consultation and it was not covered by your insurance ".  Per above- pt has concerns relating to " what is covered by my insurance especially if my chemotherapy will be covered ?"  The above request will be sent to [employer] for review and return call to pt per his concerns.  Return call number given as [phone].

## 2015-08-03 ENCOUNTER — Encounter (HOSPITAL_COMMUNITY): Payer: Self-pay

## 2015-08-04 ENCOUNTER — Telehealth: Payer: Self-pay | Admitting: *Deleted

## 2015-08-04 NOTE — Telephone Encounter (Signed)
Received vm call from pt asking if he needed to fast before 12/28 appt, can he take his regular meds, & can he drive himself?  He also states that he has a gum problem that his dentist reccommended azithromycin-Z-pak & wanted to know if this was OK to take.  Informed pt that he does not need to fast & should have a driver at least for first time until he sees how he does with treatment & OK to take regular meds.  Checked with pharmacist & no problems with meds identified.  The pt will check with his dentist, Dr Renelda Mom regarding z-pack & should be OK to take if prescribed by dentist.  Discussed meds to take at home before & after treatment & pt expressed understanding.  This message routed to MD & RN.

## 2015-08-06 ENCOUNTER — Other Ambulatory Visit: Payer: Self-pay | Admitting: *Deleted

## 2015-08-06 MED ORDER — FIRST-DUKES MOUTHWASH MT SUSP: 5.0000 mL | Freq: Three times a day (TID) | OROMUCOSAL | Status: DC

## 2015-08-10 ENCOUNTER — Telehealth: Payer: Self-pay | Admitting: *Deleted

## 2015-08-10 NOTE — Telephone Encounter (Signed)
Call from [place] at Dr. Tye Savoy office ( Pt Dentist) pt has loose tooth and will need it pulled. Reviewed with MD, returned call to Shirlean Mylar gave VO ok for pt's tooth to be pulled today prior to beginning chemo tomorrow, pt will need antibiotic. Shirlean Mylar advised she will try to get the pt back into the office today to  pull tooth and give antibiotic. No further concerns.

## 2015-08-11 ENCOUNTER — Other Ambulatory Visit: Payer: PPO

## 2015-08-11 ENCOUNTER — Encounter: Payer: Self-pay | Admitting: Pharmacist

## 2015-08-11 ENCOUNTER — Encounter: Payer: Self-pay | Admitting: Internal Medicine

## 2015-08-11 ENCOUNTER — Encounter: Payer: Self-pay | Admitting: *Deleted

## 2015-08-11 ENCOUNTER — Other Ambulatory Visit (HOSPITAL_BASED_OUTPATIENT_CLINIC_OR_DEPARTMENT_OTHER): Payer: PPO

## 2015-08-11 ENCOUNTER — Ambulatory Visit (HOSPITAL_BASED_OUTPATIENT_CLINIC_OR_DEPARTMENT_OTHER): Payer: PPO

## 2015-08-11 VITALS — BP 176/69 | HR 61 | Temp 98.0°F | Resp 18

## 2015-08-11 DIAGNOSIS — Z5111 Encounter for antineoplastic chemotherapy: Secondary | ICD-10-CM | POA: Diagnosis not present

## 2015-08-11 DIAGNOSIS — C3411 Malignant neoplasm of upper lobe, right bronchus or lung: Secondary | ICD-10-CM

## 2015-08-11 DIAGNOSIS — C342 Malignant neoplasm of middle lobe, bronchus or lung: Secondary | ICD-10-CM | POA: Diagnosis not present

## 2015-08-11 DIAGNOSIS — C770 Secondary and unspecified malignant neoplasm of lymph nodes of head, face and neck: Secondary | ICD-10-CM

## 2015-08-11 LAB — COMPREHENSIVE METABOLIC PANEL
ALT: 17 U/L (ref 0–55)
ANION GAP: 11 meq/L (ref 3–11)
AST: 19 U/L (ref 5–34)
Albumin: 3.5 g/dL (ref 3.5–5.0)
Alkaline Phosphatase: 68 U/L (ref 40–150)
BUN: 16 mg/dL (ref 7.0–26.0)
CHLORIDE: 101 meq/L (ref 98–109)
CO2: 21 meq/L — AB (ref 22–29)
Calcium: 9.5 mg/dL (ref 8.4–10.4)
Creatinine: 0.8 mg/dL (ref 0.7–1.3)
EGFR: 83 mL/min/{1.73_m2} — AB (ref >90)
GLUCOSE: 204 mg/dL — AB (ref 70–140)
Potassium: 4.3 mEq/L (ref 3.5–5.1)
SODIUM: 132 meq/L — AB (ref 136–145)
Total Bilirubin: 0.33 mg/dL (ref 0.20–1.20)
Total Protein: 7 g/dL (ref 6.4–8.3)

## 2015-08-11 LAB — CBC WITH DIFFERENTIAL/PLATELET
BASO%: 0.3 % (ref 0.0–2.0)
BASOS ABS: 0 10*3/uL (ref 0.0–0.1)
EOS%: 0 % (ref 0.0–7.0)
Eosinophils Absolute: 0 10*3/uL (ref 0.0–0.5)
HCT: 33.4 % — ABNORMAL LOW (ref 38.4–49.9)
HEMOGLOBIN: 10.9 g/dL — AB (ref 13.0–17.1)
LYMPH%: 5.5 % — AB (ref 14.0–49.0)
MCH: 30.4 pg (ref 27.2–33.4)
MCHC: 32.7 g/dL (ref 32.0–36.0)
MCV: 93 fL (ref 79.3–98.0)
MONO#: 1.2 10*3/uL — ABNORMAL HIGH (ref 0.1–0.9)
MONO%: 7.6 % (ref 0.0–14.0)
NEUT#: 13.2 10*3/uL — ABNORMAL HIGH (ref 1.5–6.5)
NEUT%: 86.6 % — ABNORMAL HIGH (ref 39.0–75.0)
Platelets: 458 10*3/uL — ABNORMAL HIGH (ref 140–400)
RBC: 3.59 10*6/uL — AB (ref 4.20–5.82)
RDW: 13.9 % (ref 11.0–14.6)
WBC: 15.2 10*3/uL — AB (ref 4.0–10.3)
lymph#: 0.8 10*3/uL — ABNORMAL LOW (ref 0.9–3.3)

## 2015-08-11 MED ORDER — SODIUM CHLORIDE 0.9 % IV SOLN
500.0000 mg/m2 | Freq: Once | INTRAVENOUS | Status: AC
  Administered 2015-08-11: 950 mg via INTRAVENOUS
  Filled 2015-08-11: qty 38

## 2015-08-11 MED ORDER — SODIUM CHLORIDE 0.9 % IV SOLN
412.5000 mg | Freq: Once | INTRAVENOUS | Status: AC
  Administered 2015-08-11: 410 mg via INTRAVENOUS
  Filled 2015-08-11: qty 41

## 2015-08-11 MED ORDER — SODIUM CHLORIDE 0.9 % IV SOLN
Freq: Once | INTRAVENOUS | Status: AC
  Administered 2015-08-11: 14:00:00 via INTRAVENOUS

## 2015-08-11 MED ORDER — SODIUM CHLORIDE 0.9 % IV SOLN
Freq: Once | INTRAVENOUS | Status: AC
  Administered 2015-08-11: 15:00:00 via INTRAVENOUS
  Filled 2015-08-11: qty 8

## 2015-08-11 NOTE — Progress Notes (Signed)
I s/w pt in infusion area prior to beginning chemo today because I noticed Prolia was on his medication list.  I also read that he was pending dental extraction. Pt stated he last received Prolia at [facility] in [facility] last week.  Indication: osteoporosis. I informed him of the risk of ONJ and he will contact his dentist tomorrow.  His wife wrote down a note to do so as a reminder.  He has not had the tooth extraction; the tooth is loose.  He also reported that his gums are often painful/swollen.   I also discussed with him the risk of using Mobic while on Alimta.  Indication for Mobic: back arthritis.  He uses Mobic daily. He stated he uses Tylenol Arthritis strength as well.   Today's SCr = 0.8; rounded to 1 for age; CrCl = 58 mL/min.  Per [employer] Interactions: "Pts w/ CrCl between 45-79 mL/min should avoid NSAIDs for 2-5 days prior to, the day of, and 2 days following pemetrexed." I provided a handout with specified instructions as above re: Mobic + Alimta interaction.  Kennith Center, Pharm.D., CPP 08/11/2015'@3'$ :12 PM

## 2015-08-11 NOTE — Progress Notes (Signed)
Pharmacy called and states pt had a question about a bill and was in the treatment area. This issue was sent previously but wasn't acknowledged due to the manner in which it was sent, it was overlooked. Went to treatment to apologize and speak with patient and made copy of the document. Sent to [place] in coding to research further. Freda Munro responded promptly and sent to billing to be corrected. Advised the patient and his wife that it was being resubmitted and would take 3-5 weeks to be resolved per Freda Munro. Gave patient my card for any other financial questions or concerns.

## 2015-08-11 NOTE — Progress Notes (Signed)
Pt tolerated first time chemo infusion well. Discharge teaching completed with teachback. Patient able to verbalize discharge medications for nausea as well as Decadron instructions. Patient would like a Port-a-cath.

## 2015-08-11 NOTE — Patient Instructions (Addendum)
[place] [facility] Discharge Instructions for Patients Receiving Chemotherapy  Today you received the following chemotherapy agents Alimta and Carboplatin  To help prevent nausea and vomiting after your treatment, we encourage you to take your nausea medication. Compazine 10 mg by mouth every six (6) hours as needed for nausea or vomiting.    If you develop nausea and vomiting that is not controlled by your nausea medication, call the clinic.   BELOW ARE SYMPTOMS THAT SHOULD BE REPORTED IMMEDIATELY:  *FEVER GREATER THAN 100.5 F  *CHILLS WITH OR WITHOUT FEVER  NAUSEA AND VOMITING THAT IS NOT CONTROLLED WITH YOUR NAUSEA MEDICATION  *UNUSUAL SHORTNESS OF BREATH  *UNUSUAL BRUISING OR BLEEDING  TENDERNESS IN MOUTH AND THROAT WITH OR WITHOUT PRESENCE OF ULCERS  *URINARY PROBLEMS  *BOWEL PROBLEMS Pemetrexed injection What is this medicine? PEMETREXED (PEM e TREX ed) is a chemotherapy drug. This medicine affects cells that are rapidly growing, such as cancer cells and cells in your mouth and stomach. It is usually used to treat lung cancers like non-small cell lung cancer and mesothelioma. It may also be used to treat other cancers. This medicine may be used for other purposes; ask your health care provider or pharmacist if you have questions. What should I tell my health care provider before I take this medicine? They need to know if you have any of these conditions: -if you frequently drink alcohol containing beverages -infection (especially a virus infection such as chickenpox, cold sores, or herpes) -kidney disease -liver disease -low blood counts, like low platelets, red bloods, or white blood cells -an unusual or allergic reaction to pemetrexed, mannitol, other medicines, foods, dyes, or preservatives -pregnant or trying to get pregnant -breast-feeding How should I use this medicine? This drug is given as an infusion into a vein. It is administered in a hospital or  clinic by a specially trained health care professional. Talk to your pediatrician regarding the use of this medicine in children. Special care may be needed. Overdosage: If you think you have taken too much of this medicine contact a poison control center or emergency room at once. NOTE: This medicine is only for you. Do not share this medicine with others. What if I miss a dose? It is important not to miss your dose. Call your doctor or health care professional if you are unable to keep an appointment. What may interact with this medicine? -aspirin and aspirin-like medicines -medicines to increase blood counts like filgrastim, pegfilgrastim, sargramostim -methotrexate -NSAIDS, medicines for pain and inflammation, like ibuprofen or naproxen -probenecid -pyrimethamine -vaccines Talk to your doctor or health care professional before taking any of these medicines: -acetaminophen -aspirin -ibuprofen -ketoprofen -naproxen This list may not describe all possible interactions. Give your health care provider a list of all the medicines, herbs, non-prescription drugs, or dietary supplements you use. Also tell them if you smoke, drink alcohol, or use illegal drugs. Some items may interact with your medicine. What should I watch for while using this medicine? Visit your doctor for checks on your progress. This drug may make you feel generally unwell. This is not uncommon, as chemotherapy can affect healthy cells as well as cancer cells. Report any side effects. Continue your course of treatment even though you feel ill unless your doctor tells you to stop. In some cases, you may be given additional medicines to help with side effects. Follow all directions for their use. Call your doctor or health care professional for advice if you get a fever,  chills or sore throat, or other symptoms of a cold or flu. Do not treat yourself. This drug decreases your body's ability to fight infections. Try to avoid  being around people who are sick. This medicine may increase your risk to bruise or bleed. Call your doctor or health care professional if you notice any unusual bleeding. Be careful brushing and flossing your teeth or using a toothpick because you may get an infection or bleed more easily. If you have any dental work done, tell your dentist you are receiving this medicine. Avoid taking products that contain aspirin, acetaminophen, ibuprofen, naproxen, or ketoprofen unless instructed by your doctor. These medicines may hide a fever. Call your doctor or health care professional if you get diarrhea or mouth sores. Do not treat yourself. To protect your kidneys, drink water or other fluids as directed while you are taking this medicine. Men and women must use effective birth control while taking this medicine. You may also need to continue using effective birth control for a time after stopping this medicine. Do not become pregnant while taking this medicine. Tell your doctor right away if you think that you or your partner might be pregnant. There is a potential for serious side effects to an unborn child. Talk to your health care professional or pharmacist for more information. Do not breast-feed an infant while taking this medicine. This medicine may lower sperm counts. What side effects may I notice from receiving this medicine? Side effects that you should report to your doctor or health care professional as soon as possible: -allergic reactions like skin rash, itching or hives, swelling of the face, lips, or tongue -low blood counts - this medicine may decrease the number of white blood cells, red blood cells and platelets. You may be at increased risk for infections and bleeding. -signs of infection - fever or chills, cough, sore throat, pain or difficulty passing urine -signs of decreased platelets or bleeding - bruising, pinpoint red spots on the skin, black, tarry stools, blood in the  urine -signs of decreased red blood cells - unusually weak or tired, fainting spells, lightheadedness -breathing problems, like a dry cough -changes in emotions or moods -chest pain -confusion -diarrhea -high blood pressure -mouth or throat sores or ulcers -pain, swelling, warmth in the leg -pain on swallowing -swelling of the ankles, feet, hands -trouble passing urine or change in the amount of urine -vomiting -yellowing of the eyes or skin Side effects that usually do not require medical attention (report to your doctor or health care professional if they continue or are bothersome): -hair loss -loss of appetite -nausea -stomach upset This list may not describe all possible side effects. Call your doctor for medical advice about side effects. You may report side effects to FDA at 1-800-FDA-1088. Where should I keep my medicine? This drug is given in a hospital or clinic and will not be stored at home. NOTE: This sheet is a summary. It may not cover all possible information. If you have questions about this medicine, talk to your doctor, pharmacist, or health care provider.    2016, Elsevier/Gold Standard. (2008-03-03 13:24:03)   UNUSUAL RASH Items with * indicate a potential emergency and should be followed up as soon as possible.  Feel free to call the clinic you have any questions or concerns. The clinic phone number is [phone].  Please show the [facility] at check-in to the Emergency Department and triage nurse.  Carboplatin injection What is this medicine?  CARBOPLATIN (KAR boe pla tin) is a chemotherapy drug. It targets fast dividing cells, like cancer cells, and causes these cells to die. This medicine is used to treat ovarian cancer and many other cancers. This medicine may be used for other purposes; ask your health care provider or pharmacist if you have questions. What should I tell my health care provider before I take this medicine? They need to know  if you have any of these conditions: -blood disorders -hearing problems -kidney disease -recent or ongoing radiation therapy -an unusual or allergic reaction to carboplatin, cisplatin, other chemotherapy, other medicines, foods, dyes, or preservatives -pregnant or trying to get pregnant -breast-feeding How should I use this medicine? This drug is usually given as an infusion into a vein. It is administered in a hospital or clinic by a specially trained health care professional. Talk to your pediatrician regarding the use of this medicine in children. Special care may be needed. Overdosage: If you think you have taken too much of this medicine contact a poison control center or emergency room at once. NOTE: This medicine is only for you. Do not share this medicine with others. What if I miss a dose? It is important not to miss a dose. Call your doctor or health care professional if you are unable to keep an appointment. What may interact with this medicine? -medicines for seizures -medicines to increase blood counts like filgrastim, pegfilgrastim, sargramostim -some antibiotics like amikacin, gentamicin, neomycin, streptomycin, tobramycin -vaccines Talk to your doctor or health care professional before taking any of these medicines: -acetaminophen -aspirin -ibuprofen -ketoprofen -naproxen This list may not describe all possible interactions. Give your health care provider a list of all the medicines, herbs, non-prescription drugs, or dietary supplements you use. Also tell them if you smoke, drink alcohol, or use illegal drugs. Some items may interact with your medicine. What should I watch for while using this medicine? Your condition will be monitored carefully while you are receiving this medicine. You will need important blood work done while you are taking this medicine. This drug may make you feel generally unwell. This is not uncommon, as chemotherapy can affect healthy cells as  well as cancer cells. Report any side effects. Continue your course of treatment even though you feel ill unless your doctor tells you to stop. In some cases, you may be given additional medicines to help with side effects. Follow all directions for their use. Call your doctor or health care professional for advice if you get a fever, chills or sore throat, or other symptoms of a cold or flu. Do not treat yourself. This drug decreases your body's ability to fight infections. Try to avoid being around people who are sick. This medicine may increase your risk to bruise or bleed. Call your doctor or health care professional if you notice any unusual bleeding. Be careful brushing and flossing your teeth or using a toothpick because you may get an infection or bleed more easily. If you have any dental work done, tell your dentist you are receiving this medicine. Avoid taking products that contain aspirin, acetaminophen, ibuprofen, naproxen, or ketoprofen unless instructed by your doctor. These medicines may hide a fever. Do not become pregnant while taking this medicine. Women should inform their doctor if they wish to become pregnant or think they might be pregnant. There is a potential for serious side effects to an unborn child. Talk to your health care professional or pharmacist for more information. Do not breast-feed an infant  while taking this medicine. What side effects may I notice from receiving this medicine? Side effects that you should report to your doctor or health care professional as soon as possible: -allergic reactions like skin rash, itching or hives, swelling of the face, lips, or tongue -signs of infection - fever or chills, cough, sore throat, pain or difficulty passing urine -signs of decreased platelets or bleeding - bruising, pinpoint red spots on the skin, black, tarry stools, nosebleeds -signs of decreased red blood cells - unusually weak or tired, fainting spells,  lightheadedness -breathing problems -changes in hearing -changes in vision -chest pain -high blood pressure -low blood counts - This drug may decrease the number of white blood cells, red blood cells and platelets. You may be at increased risk for infections and bleeding. -nausea and vomiting -pain, swelling, redness or irritation at the injection site -pain, tingling, numbness in the hands or feet -problems with balance, talking, walking -trouble passing urine or change in the amount of urine Side effects that usually do not require medical attention (report to your doctor or health care professional if they continue or are bothersome): -hair loss -loss of appetite -metallic taste in the mouth or changes in taste This list may not describe all possible side effects. Call your doctor for medical advice about side effects. You may report side effects to FDA at 1-800-FDA-1088. Where should I keep my medicine? This drug is given in a hospital or clinic and will not be stored at home. NOTE: This sheet is a summary. It may not cover all possible information. If you have questions about this medicine, talk to your doctor, pharmacist, or health care provider.    2016, Elsevier/Gold Standard. (2007-11-05 14:38:05)

## 2015-08-12 ENCOUNTER — Other Ambulatory Visit: Payer: Self-pay | Admitting: *Deleted

## 2015-08-12 DIAGNOSIS — C3411 Malignant neoplasm of upper lobe, right bronchus or lung: Secondary | ICD-10-CM

## 2015-08-12 NOTE — Progress Notes (Signed)
Call from RN in infusion room, pt and wife have requested pt have PAC placed. Order entered.

## 2015-08-14 ENCOUNTER — Encounter (HOSPITAL_COMMUNITY): Payer: Self-pay

## 2015-08-14 ENCOUNTER — Emergency Department (HOSPITAL_COMMUNITY)
Admission: EM | Admit: 2015-08-14 | Discharge: 2015-08-14 | Disposition: A | Discharge status: Home / Self Care | Payer: PPO | Attending: Emergency Medicine | Admitting: Emergency Medicine

## 2015-08-14 ENCOUNTER — Emergency Department (HOSPITAL_COMMUNITY): Payer: PPO

## 2015-08-14 DIAGNOSIS — Z7982 Long term (current) use of aspirin: Secondary | ICD-10-CM | POA: Insufficient documentation

## 2015-08-14 DIAGNOSIS — C3490 Malignant neoplasm of unspecified part of unspecified bronchus or lung: Secondary | ICD-10-CM | POA: Insufficient documentation

## 2015-08-14 DIAGNOSIS — M1990 Unspecified osteoarthritis, unspecified site: Secondary | ICD-10-CM | POA: Insufficient documentation

## 2015-08-14 DIAGNOSIS — K219 Gastro-esophageal reflux disease without esophagitis: Secondary | ICD-10-CM | POA: Insufficient documentation

## 2015-08-14 DIAGNOSIS — Z86010 Personal history of colonic polyps: Secondary | ICD-10-CM | POA: Diagnosis not present

## 2015-08-14 DIAGNOSIS — R109 Unspecified abdominal pain: Secondary | ICD-10-CM

## 2015-08-14 DIAGNOSIS — Z8781 Personal history of (healed) traumatic fracture: Secondary | ICD-10-CM | POA: Insufficient documentation

## 2015-08-14 DIAGNOSIS — K5900 Constipation, unspecified: Secondary | ICD-10-CM | POA: Diagnosis present

## 2015-08-14 DIAGNOSIS — K5909 Other constipation: Secondary | ICD-10-CM | POA: Diagnosis not present

## 2015-08-14 DIAGNOSIS — Z880 Allergy status to penicillin: Secondary | ICD-10-CM | POA: Diagnosis not present

## 2015-08-14 DIAGNOSIS — Z79899 Other long term (current) drug therapy: Secondary | ICD-10-CM | POA: Insufficient documentation

## 2015-08-14 DIAGNOSIS — Z9049 Acquired absence of other specified parts of digestive tract: Secondary | ICD-10-CM | POA: Insufficient documentation

## 2015-08-14 DIAGNOSIS — K6289 Other specified diseases of anus and rectum: Secondary | ICD-10-CM | POA: Insufficient documentation

## 2015-08-14 DIAGNOSIS — Z8701 Personal history of pneumonia (recurrent): Secondary | ICD-10-CM | POA: Insufficient documentation

## 2015-08-14 LAB — CBC WITH DIFFERENTIAL/PLATELET
BASOS ABS: 0 10*3/uL (ref 0.0–0.1)
BASOS PCT: 0 %
EOS ABS: 0.2 10*3/uL (ref 0.0–0.7)
Eosinophils Relative: 1 %
HCT: 33.5 % — ABNORMAL LOW (ref 39.0–52.0)
HEMOGLOBIN: 11.5 g/dL — AB (ref 13.0–17.0)
Lymphocytes Relative: 8 %
Lymphs Abs: 1.2 10*3/uL (ref 0.7–4.0)
MCH: 31.1 pg (ref 26.0–34.0)
MCHC: 34.3 g/dL (ref 30.0–36.0)
MCV: 90.5 fL (ref 78.0–100.0)
Monocytes Absolute: 1 10*3/uL (ref 0.1–1.0)
Monocytes Relative: 7 %
NEUTROS PCT: 84 %
Neutro Abs: 12.9 10*3/uL — ABNORMAL HIGH (ref 1.7–7.7)
Platelets: 458 10*3/uL — ABNORMAL HIGH (ref 150–400)
RBC: 3.7 MIL/uL — AB (ref 4.22–5.81)
RDW: 13.6 % (ref 11.5–15.5)
WBC: 15.3 10*3/uL — AB (ref 4.0–10.5)

## 2015-08-14 LAB — COMPREHENSIVE METABOLIC PANEL
ALBUMIN: 3.5 g/dL (ref 3.5–5.0)
ALK PHOS: 54 U/L (ref 38–126)
ALT: 28 U/L (ref 17–63)
ANION GAP: 4 — AB (ref 5–15)
AST: 25 U/L (ref 15–41)
BUN: 13 mg/dL (ref 6–20)
CALCIUM: 9.7 mg/dL (ref 8.9–10.3)
CO2: 29 mmol/L (ref 22–32)
Chloride: 99 mmol/L — ABNORMAL LOW (ref 101–111)
Creatinine, Ser: 0.51 mg/dL — ABNORMAL LOW (ref 0.61–1.24)
GFR calc Af Amer: >60 mL/min (ref >60)
GFR calc non Af Amer: >60 mL/min (ref >60)
GLUCOSE: 104 mg/dL — AB (ref 65–99)
Potassium: 4.7 mmol/L (ref 3.5–5.1)
SODIUM: 132 mmol/L — AB (ref 135–145)
Total Bilirubin: 1.1 mg/dL (ref 0.3–1.2)
Total Protein: 6.3 g/dL — ABNORMAL LOW (ref 6.5–8.1)

## 2015-08-14 LAB — URINALYSIS, ROUTINE W REFLEX MICROSCOPIC
BILIRUBIN URINE: NEGATIVE
Glucose, UA: NEGATIVE mg/dL
Hgb urine dipstick: NEGATIVE
Ketones, ur: NEGATIVE mg/dL
Leukocytes, UA: NEGATIVE
NITRITE: NEGATIVE
PH: 7 (ref 5.0–8.0)
Protein, ur: NEGATIVE mg/dL
SPECIFIC GRAVITY, URINE: 1.005 (ref 1.005–1.030)

## 2015-08-14 LAB — POC OCCULT BLOOD, ED: Fecal Occult Bld: POSITIVE — AB

## 2015-08-14 LAB — LIPASE, BLOOD: Lipase: 34 U/L (ref 11–51)

## 2015-08-14 MED ORDER — CLONIDINE HCL 0.1 MG PO TABS
0.1000 mg | ORAL_TABLET | Freq: Every day | ORAL | Status: DC
  Administered 2015-08-14: 0.1 mg via ORAL
  Filled 2015-08-14: qty 1

## 2015-08-14 MED ORDER — METRONIDAZOLE 500 MG PO TABS: 500.0000 mg | ORAL_TABLET | Freq: Two times a day (BID) | ORAL | Status: DC

## 2015-08-14 MED ORDER — CIPROFLOXACIN HCL 500 MG PO TABS: 500.0000 mg | ORAL_TABLET | Freq: Two times a day (BID) | ORAL | Status: DC

## 2015-08-14 MED ORDER — SENNOSIDES-DOCUSATE SODIUM 8.6-50 MG PO TABS: 1.0000 | ORAL_TABLET | Freq: Once | ORAL | Status: DC

## 2015-08-14 MED ORDER — IOHEXOL 300 MG/ML  SOLN
100.0000 mL | Freq: Once | INTRAMUSCULAR | Status: AC | PRN
  Administered 2015-08-14: 100 mL via INTRAVENOUS

## 2015-08-14 MED ORDER — SORBITOL 70 % SOLN: 30.0000 mL | Freq: Every day | Status: DC | PRN

## 2015-08-14 MED ORDER — SENNOSIDES-DOCUSATE SODIUM 8.6-50 MG PO TABS
1.0000 | ORAL_TABLET | Freq: Once | ORAL | Status: AC
  Administered 2015-08-14: 1 via ORAL
  Filled 2015-08-14: qty 1

## 2015-08-14 MED ORDER — SORBITOL 70 % SOLN
30.0000 mL | Freq: Every day | Status: DC | PRN
  Administered 2015-08-14: 30 mL via ORAL
  Filled 2015-08-14: qty 30

## 2015-08-14 NOTE — ED Notes (Signed)
Pt is chemo pt.  Had first chemo on Wednesday. Told he might have constipation.  Unable to go for 3 days.  Had abdominal pain yesterday but did have bm last night. No abdominal pain today.  No n/v

## 2015-08-14 NOTE — ED Notes (Signed)
Awake. Verbally responsive. A/O x4. Resp even and unlabored. No audible adventitious breath sounds noted. ABC's intact. IV saline lock patent and intact. Family at bedside. Pt has been ambulating to BR.

## 2015-08-14 NOTE — ED Notes (Signed)
Awake. Verbally responsive. A/O x4. Resp even and unlabored. No audible adventitious breath sounds noted. ABC's intact.  

## 2015-08-14 NOTE — ED Notes (Signed)
Sean Cooke, [place] at bedside to do rectal exam. Pt tolerated well.

## 2015-08-14 NOTE — ED Notes (Addendum)
Awake. Verbally responsive. A/O x4. Resp even and unlabored. No audible adventitious breath sounds noted. ABC's intact. IV saline lock patent and intact. Family at bedside. Pt ambulated to BR but was not able to have a BM.

## 2015-08-14 NOTE — ED Notes (Signed)
Awake. Verbally responsive. Resp even and unlabored. No audible adventitious breath sounds noted. ABC's intact. Abd soft/nondistended but tender to palpate. BS (+) and active x4 quadrants. Pt reported nausea without emesis/diarrhea.

## 2015-08-14 NOTE — Discharge Instructions (Signed)
1. Medications: cipro, flagyl, usual home medications 2. Treatment: rest, drink plenty of fluids 3. Follow Up: please followup with your primary doctor next week for discussion of your diagnoses and further evaluation after today's visit; if you do not have a primary care doctor use the resource guide provided to find one; please return to the ER for high fever, severe pain, persistent constipation, new or worsening symptoms   Abdominal Pain, Adult Many things can cause abdominal pain. Usually, abdominal pain is not caused by a disease and will improve without treatment. It can often be observed and treated at home. Your health care provider will do a physical exam and possibly order blood tests and X-rays to help determine the seriousness of your pain. However, in many cases, more time must pass before a clear cause of the pain can be found. Before that point, your health care provider may not know if you need more testing or further treatment. HOME CARE INSTRUCTIONS Monitor your abdominal pain for any changes. The following actions may help to alleviate any discomfort you are experiencing:  Only take over-the-counter or prescription medicines as directed by your health care provider.  Do not take laxatives unless directed to do so by your health care provider.  Try a clear liquid diet (broth, tea, or water) as directed by your health care provider. Slowly move to a bland diet as tolerated. SEEK MEDICAL CARE IF:  You have unexplained abdominal pain.  You have abdominal pain associated with nausea or diarrhea.  You have pain when you urinate or have a bowel movement.  You experience abdominal pain that wakes you in the night.  You have abdominal pain that is worsened or improved by eating food.  You have abdominal pain that is worsened with eating fatty foods.  You have a fever. SEEK IMMEDIATE MEDICAL CARE IF:  Your pain does not go away within 2 hours.  You keep throwing up  (vomiting).  Your pain is felt only in portions of the abdomen, such as the right side or the left lower portion of the abdomen.  You pass bloody or black tarry stools. MAKE SURE YOU:  Understand these instructions.  Will watch your condition.  Will get help right away if you are not doing well or get worse.   This information is not intended to replace advice given to you by your health care provider. Make sure you discuss any questions you have with your health care provider.   Document Released: 05/10/2005 Document Revised: 04/21/2015 Document Reviewed: 04/09/2013 Elsevier Interactive Patient Education 2016 [employer]  Proctitis Proctitis is swelling and soreness (inflammation) of the lining of the rectum. The rectum is at the end of the large intestine, and it leads to the anus. The inflammation causes pain and discomfort. It may be a short-term (acute) or long-lasting (chronic) problem. CAUSES This condition may be caused by:  STDs (sexually transmitted diseases).  Infection.  Trauma or injury to the anus or rectum.  Ulcerative colitis or Crohn disease.  Radiation therapy that is directed near the rectum.  Antibiotic therapy. SYMPTOMS Symptoms of this condition include:  Sudden, uncomfortable, and frequent urge to have a bowel movement.  Anal pain or rectal pain.  Pain or cramping in the abdomen.  Sensation that the rectum is full.  Rectal bleeding.  Pus or mucus discharge from the anus.  Diarrhea or frequent soft, loose stools.  Constipation.  Pain with bowel movements. DIAGNOSIS This condition may be diagnosed based on:  A medical history and physical exam.  Various tests, such as:  An STD test.  Blood tests.  Stool tests.  Rectal culture.  A procedure to evaluate the anal canal (anoscopy).  Procedures to look at the entire large bowel or part of it (colonoscopy or sigmoidoscopy). TREATMENT Treatment for this condition depends on the  cause. The main goals of treatment are to reduce the symptoms of inflammation and to get rid of any infection. Treatment may include:  Home remedies and lifestyle changes, such as sitz baths and avoiding food right before bedtime.  Medicines, such as:  Topical ointments, foams, suppositories, or enemas, such as corticosteroids or anti-inflammatories.  Antibiotic or antiviral medicines to treat infection or to control harmful bacteria.  Medicines to control diarrhea, soften stools, and reduce pain.  Medicines to suppress the immune system.  Nutritional, dietary, or herbal supplements.  Avoiding the activity that caused rectal trauma.  Heat or laser therapy for persistent bleeding.  A dilation procedure to enlarge a narrowed rectum.  Surgery to repair damaged rectal lining. This is rare. If your proctitis was caused by an STD, your health care provider may test you for infection again 3 months after treatment. HOME CARE INSTRUCTIONS  Take over-the-counter and prescription medicines only as told by your health care provider.  If you were prescribed an antibiotic medicine, take it as told by your health care provider. Do not stop taking the antibiotic even if you start to feel better.  Try to avoid eating right before bedtime.  Take sitz baths as told by your health care provider.  Keep all follow-up visits as told by your health care provider. This is important. SEEK MEDICAL CARE IF:  Your symptoms do not improve with treatment.  Your symptoms get worse.  You have a fever.   This information is not intended to replace advice given to you by your health care provider. Make sure you discuss any questions you have with your health care provider.   Document Released: 07/20/2011 Document Revised: 04/21/2015 Document Reviewed: 10/26/2014 Elsevier Interactive Patient Education [employer].

## 2015-08-14 NOTE — ED Provider Notes (Signed)
CSN: [id number]     Arrival date & time 08/14/15  7425 History   First MD Initiated Contact with Patient 08/14/15 [phone]     Chief Complaint  Patient presents with  . Constipation    HPI   Sean Cooke is a 81 y.o. male with a PMH of GERD, hiatal hernia, diverticulosis, hemorrhoids who presents to the ED with constipation. He states he had his first chemo session for lung cancer on Wednesday, and reports he has not had a normal bowel movement since Tuesday. He denies exacerbating factors. He states he has tried taking miralax for his symptoms. He reports yesterday, he experienced lower abdominal pain, which is now resolved. He states this morning, he had a small, loose bowel movement. He reports associated nausea. He denies fever, chills, vomiting.   Past Medical History  Diagnosis Date  . GERD (gastroesophageal reflux disease)   . Hiatal hernia   . Degenerative joint disease   . Rib fracture   . Diverticulosis   . Adenomatous colon polyp 05/2003  . Duodenal diverticulum   . Neuritis/radiculitis due to displacement of lumbar intervertebral disc   . Arthritis   . Pneumonia   . Hemorrhoids   . Encounter for antineoplastic chemotherapy 07/31/2015   Past Surgical History  Procedure Laterality Date  . Inguinal hernia repair    . Transurethral resection of prostate    . Splenectomy  2002  . Cataract extraction      bilateral  . Cholecystectomy  06/2003   Family History  Problem Relation Age of Onset  . Diabetes Father   . Colon cancer Neg Hx    Social History  Substance Use Topics  . Smoking status: Former Smoker    Types: Cigarettes, Pipe    Quit date: 08/14/1988  . Smokeless tobacco: Never Used  . Alcohol Use: 0.0 oz/week    0 Standard drinks or equivalent per week     Comment: rarely wine     Review of Systems  Constitutional: Negative for fever and chills.  Gastrointestinal: Positive for abdominal pain and constipation. Negative for nausea, vomiting and  diarrhea.  All other systems reviewed and are negative.     Allergies  Benzonatate; Lyrica; Penicillins; Pneumococcal vaccine polyvalent; Tizanidine; and Tramadol  Home Medications   Prior to Admission medications   Medication Sig Start Date End Date Taking? Authorizing Provider  acetaminophen (TYLENOL) 650 MG CR tablet Take 650 mg by mouth every 6 (six) hours as needed for pain or fever.    Yes Historical Provider, MD  alum & mag hydroxide-simeth (MAALOX PLUS) 400-400-40 MG/5ML suspension Take 5 mLs by mouth every 6 (six) hours as needed for indigestion.   Yes Historical Provider, MD  aspirin 81 MG tablet Take 81 mg by mouth at bedtime.    Yes Historical Provider, MD  Calcium Carbonate-Vitamin D (CALCIUM + D PO) Take 1 capsule by mouth 2 (two) times daily.    Yes Historical Provider, MD  cromolyn (OPTICROM) 4 % ophthalmic solution Administer 1 drop to both eyes Four (4) times a day.   Yes Historical Provider, MD  denosumab (PROLIA) 60 MG/ML SOLN injection Inject 60 mg into the skin every 6 (six) months. Administer in upper arm, thigh, or abdomen   Yes Historical Provider, MD  dexamethasone (DECADRON) 4 MG tablet 4 mg by mouth twice a day the day before, day of and day after the chemotherapy every 3 weeks 07/30/15  Yes Curt Bears, MD  dextromethorphan-guaiFENesin (ROBITUSSIN-DM) 10-100 MG/5ML liquid  Take 10 mLs by mouth every 4 (four) hours as needed for cough.   Yes Historical Provider, MD  Diphenhyd-Hydrocort-Nystatin (FIRST-DUKES MOUTHWASH) SUSP Use as directed 5 mLs in the mouth or throat 3 (three) times daily. 08/06/15  Yes Curt Bears, MD  folic acid (FOLVITE) 1 MG tablet Take 1 tablet (1 mg total) by mouth daily. 07/30/15  Yes Curt Bears, MD  hydrocortisone (PROCTOSOL HC) 2.5 % rectal cream Place 1 application rectally 2 (two) times daily as needed for hemorrhoids.    Yes Historical Provider, MD  lactobacillus acidophilus & bulgar (LACTINEX) chewable tablet Chew 1 tablet  by mouth 2 (two) times daily. Store in [place], [place]   Yes Historical Provider, MD  lactulose (CHRONULAC) 10 GM/15ML solution TAKE 15 MLS ONCE DAILY AS DIRECTED 07/19/15  Yes Ladene Artist, MD  loratadine (CLARITIN) 10 MG tablet Take 10 mg by mouth.   Yes Historical Provider, MD  losartan (COZAAR) 50 MG tablet Take 50 mg by mouth daily.   Yes Historical Provider, MD  meloxicam (MOBIC) 7.5 MG tablet Take 7.5 mg by mouth daily as needed for pain.    Yes Historical Provider, MD  Multiple Vitamin (MULTIVITAMIN) tablet Take 1 tablet by mouth daily.   Yes Historical Provider, MD  Omega-3 Fatty Acids (FISH OIL) 1000 MG CAPS Take 1,000 mg by mouth daily.    Yes Historical Provider, MD  oxybutynin (DITROPAN) 5 MG tablet Take 5 mg by mouth daily.   Yes Historical Provider, MD  pantoprazole (PROTONIX) 40 MG tablet Take 40 mg by mouth daily.   Yes Historical Provider, MD  Probiotic Product (ALIGN) 4 MG CAPS Take 1 capsule by mouth daily.   Yes Historical Provider, MD  prochlorperazine (COMPAZINE) 10 MG tablet Take 1 tablet (10 mg total) by mouth every 6 (six) hours as needed for nausea or vomiting. 07/30/15  Yes Curt Bears, MD  saw palmetto 160 MG capsule Take 160 mg by mouth daily.   Yes Historical Provider, MD  Tamsulosin HCl (FLOMAX) 0.4 MG CAPS Take 0.4 mg by mouth daily.   Yes Historical Provider, MD  azithromycin (ZITHROMAX) 250 MG tablet Take 1-2 tablets by mouth as directed. Reported on 08/14/2015 08/04/15   Historical Provider, MD  benzonatate (TESSALON) 100 MG capsule Take 1 capsule (100 mg total) by mouth 3 (three) times daily as needed for cough. Patient not taking: Reported on 08/14/2015 10/07/14   Venetia Maxon Rama, MD  ciprofloxacin (CIPRO) 500 MG tablet Take 1 tablet (500 mg total) by mouth every 12 (twelve) hours. 08/14/15   Marella Chimes, PA-C  metroNIDAZOLE (FLAGYL) 500 MG tablet Take 1 tablet (500 mg total) by mouth 2 (two) times daily. 08/14/15   Marella Chimes, PA-C   senna-docusate (SENOKOT-S) 8.6-50 MG tablet Take 1 tablet by mouth once. 08/14/15   Nita Sells, MD  sorbitol 70 % SOLN Take 30 mLs by mouth daily as needed for moderate constipation. 08/14/15   Nita Sells, MD    BP 188/86 mmHg  Pulse 76  Temp(Src) 97.6 F (36.4 C) (Oral)  Resp 18  SpO2 98% Physical Exam  Constitutional: He is oriented to person, place, and time. He appears well-developed and well-nourished. No distress.  HENT:  Head: Normocephalic and atraumatic.  Right Ear: External ear normal.  Left Ear: External ear normal.  Nose: Nose normal.  Mouth/Throat: Uvula is midline, oropharynx is clear and moist and mucous membranes are normal.  Eyes: Conjunctivae, EOM and lids are normal. Pupils are equal, round, and  reactive to light. Right eye exhibits no discharge. Left eye exhibits no discharge. No scleral icterus.  Neck: Normal range of motion. Neck supple.  Cardiovascular: Normal rate, regular rhythm, normal heart sounds, intact distal pulses and normal pulses.   Pulmonary/Chest: Effort normal and breath sounds normal. No respiratory distress. He has no wheezes. He has no rales.  Abdominal: Soft. Normal appearance and bowel sounds are normal. He exhibits no distension and no mass. There is tenderness. There is no rigidity, no rebound and no guarding.  Mild TTP in suprapubic region. No rebound, guarding, or masses.  Genitourinary: Rectal exam shows tenderness. Rectal exam shows no external hemorrhoid, no internal hemorrhoid, no fissure and no mass.  No fecal impaction on DRE.  Musculoskeletal: Normal range of motion. He exhibits no edema or tenderness.  Neurological: He is alert and oriented to person, place, and time. He has normal strength. No sensory deficit.  Skin: Skin is warm, dry and intact. No rash noted. He is not diaphoretic. No erythema. No pallor.  Psychiatric: He has a normal mood and affect. His speech is normal and behavior is normal.  Nursing note  and vitals reviewed.   ED Course  Procedures (including critical care time)  Labs Review Labs Reviewed  CBC WITH DIFFERENTIAL/PLATELET - Abnormal; Notable for the following:    WBC 15.3 (*)    RBC 3.70 (*)    Hemoglobin 11.5 (*)    HCT 33.5 (*)    Platelets 458 (*)    Neutro Abs 12.9 (*)    All other components within normal limits  COMPREHENSIVE METABOLIC PANEL - Abnormal; Notable for the following:    Sodium 132 (*)    Chloride 99 (*)    Glucose, Bld 104 (*)    Creatinine, Ser 0.51 (*)    Total Protein 6.3 (*)    Anion gap 4 (*)    All other components within normal limits  POC OCCULT BLOOD, ED - Abnormal; Notable for the following:    Fecal Occult Bld POSITIVE (*)    All other components within normal limits  LIPASE, BLOOD  URINALYSIS, ROUTINE W REFLEX MICROSCOPIC (NOT AT [facility])    Imaging Review Ct Abdomen Pelvis W Contrast  08/14/2015  CLINICAL DATA:  Initial encounter for lower abdominal pain with constipation for 3 days. Personal history of lung cancer. EXAM: CT ABDOMEN AND PELVIS WITH CONTRAST TECHNIQUE: Multidetector CT imaging of the abdomen and pelvis was performed using the standard protocol following bolus administration of intravenous contrast. CONTRAST:  1109m OMNIPAQUE IOHEXOL 300 MG/ML  SOLN COMPARISON:  03/05/2015. FINDINGS: Lower chest: 16 x 20 mm right middle lobe nodule has slightly more associated airspace disease than on a chest CT from 05/27/2015. Tiny pericardial effusion not substantially changed since 03/05/2015. Hepatobiliary: 10 mm low-density lesion in the dome of the liver is stable. Gallbladder is surgically absent. No intrahepatic or extrahepatic biliary dilation. Pancreas: Pancreas is diffusely atrophic without dilatation of the main duct. Spleen: Spleen is surgically absent with multiple splenules evident. Adrenals/Urinary Tract: No adrenal nodule or mass. Stable 11 mm interpolar right renal cyst. 7 mm nonobstructing stone identified lower pole left  kidney. No evidence for hydroureter. Urinary bladder is distended. Stomach/Bowel: Stomach is nondistended. No gastric wall thickening. No evidence of outlet obstruction. Large duodenum diverticulum noted. No small bowel wall thickening. No small bowel dilatation. The terminal ileum is normal. The appendix is not visualized, but there is no edema or inflammation in the region of the cecum. Diverticular changes are  noted in the left colon without evidence of diverticulitis. Prominent circumferential wall thickening is identified in the rectum (see image 95 series 2). Vascular/Lymphatic: There is abdominal aortic atherosclerosis without aneurysm. There is no gastrohepatic or hepatoduodenal ligament lymphadenopathy. No intraperitoneal or retroperitoneal lymphadenopy. No pelvic sidewall lymphadenopathy. Reproductive: Prostate gland is enlarged. Other: No intraperitoneal free fluid. Musculoskeletal: Bone windows reveal no worrisome lytic or sclerotic osseous lesions. IMPRESSION: 1. Circumferential wall thickening and edema in the rectum may be related to an infectious or inflammatory proctitis. No substantial perirectal edema or inflammation. 2. Known right middle lobe lesion with interval increase and adjacent airspace disease. Progressive neoplasm is not excluded. 3. Status post splenectomy with multiple splenules evident. 4. 7 mm nonobstructing stone lower pole left kidney with stable 11 mm interpolar right renal cyst. 5. Prostatomegaly. 6. Abdominal aortic atherosclerosis. Electronically Signed   By: Misty Stanley M.D.   On: 08/14/2015 14:06     I have personally reviewed and evaluated these images and lab results as part of my medical decision-making.   EKG Interpretation None      MDM   Final diagnoses:  Abdominal pain  Proctitis    81 year old male presents with constipation x 4 days. Reports lower abdominal pain yesterday, now resolved, and associated nausea. States this morning, he had a small,  loose bowel movement. Denies fever, chills, vomiting.  Patient is afebrile. Vital signs stable. Heart regular rate and rhythm. Lungs clear to auscultation bilaterally. Abdomen soft, nondistended, with mild tenderness to palpation of suprapubic region. No rebound, guarding, or masses. Mild TTP on DRE. No fecal impaction.  CBC remarkable for leukocytosis of 15.3, which appears chronic. Hemoglobin 11.5, which appears improved from prior. CMP unremarkable. Lipase within normal limits. UA negative for infection. Hemoccult positive. Will obtain CT abdomen and pelvis for further evaluation of symptoms.  Hospitalist consulted given patient's age and evidence of proctitis on CT abdomen and pelvis. Spoke with Dr. Verlon Au, who advised he will see the patient in the ED and determine whether or not he feels admission is indicated. Per consult note, admission not indicated. Advised to treat constipation with stool softeners and recommended patient follow-up with PCP regarding positive hemoccult. Patient is nontoxic and well-appearing, feel he is stable for discharge at this time. Will give cipro and flagyl for proctitis. Return precautions discussed. Patient verbalizes his understanding and is in agreement with plan.  Patient discussed with and seen by Dr. Tomi Bamberger.  BP 188/86 mmHg  Pulse 76  Temp(Src) 97.6 F (36.4 C) (Oral)  Resp 18  SpO2 98%        Marella Chimes, PA-C 08/14/15 1614

## 2015-08-14 NOTE — ED Notes (Signed)
Hospitalist at bedside 

## 2015-08-14 NOTE — ED Provider Notes (Signed)
Medical screening examination/treatment/procedure(s) were conducted as a shared visit with non-physician practitioner(s) and myself.  I personally evaluated the patient during the encounter.  Pt presented to the ED with complaints of constipation.  CT scan does not show CT scan but rather proctitis.  Considering his age and comorbidities will start IV abx.  Admit for further treatment.  Dorie Rank, MD 08/14/15 [phone]

## 2015-08-14 NOTE — Consult Note (Signed)
Brief consult note   Constipation not otherwise specified probably secondary to chemotherapeutic agents. Patient has a white count which is chronic since early 2016 CT scan although positive for signs of inflammation is nonspecific and typically over calls infection.  Patient had 2 further stools in the emergency department which were small-caliber nonbloody and formed His abdominal pain is improved He does not need hospital admission I would recommend sorbitol 30 daily when necessary as well as Senokot S 1-2 tabs daily and I have called in those prescriptions and send them to his pharmacy and instructed his wife to pick them up.  This is chepaer than lactulose and should be taken daily I've also advised that patient try some prune juice at home Requested Nursing give him a dose of sorbitol before he goes so that he can be on a regular schedule and ensure that he gets this at least today  Lung cancer- Continue treatment as per oncologist Continue Decadron 4 mg twice a day post therapy  Degenerative disc disease-hold off on Modicon as interactions with Alimta  Guaiac positive stool Recommend that the patient follow-up as an outpatient on a regular bowel regimen and could check X3 Hemoccult cards If he doesn't have a gastroenterologist would recommend follow-up if these are positive Noted that the patient is on Proctosol twice a day for hemorrhoids and with a hemoglobin of 11 not sure felt work this up further as he is outside of guidelines age range for needs for colonoscopy   Hypertensive   urgency   no headache no chest pain Continue losartan 50 daily  Wife states patient was agitated and having to wait and was getting impatient and this may be what drove up his blood pressures I've advised that he get one dose of clonidine prior to discharge and take it easy at home for the new year last  Will cc his oncologist on this note     81 y/ o ? Known h/o stg ii b T1a,N3,M0 NSCLC  adenoca diagnosed 06/2015 on recent chemo with Carboplatin AUC5, Alimta 500 q 3 wk Gerd Hital hernia DDD Remote smoking ho quit 1991 Hematuria felt to be 2/2  Non obst stone  Hemoglobin 11.5, WBSC 15.2, Ua clear CT abd pelvic ? Inflamm/Infectious proctitisA  He w Secondary to diabetes nurse in charge ofas recently given infusion for the first time carboplatin AUC 5, Alimta 500 every 3 weekly and received this on 08/11/15.  He did fairly well but then proceeded to have some nausea and his comfort and then did not pass 2 for 3 days Came to the emergency room because of some abdominal pain On evaluation by the emergency room provider patient was significant a tender to lower quadrant however when I examine him at the bedside he has no tenderness now and this may be after passing stool On exam by emergency room provider there was noted to be small amount of stool which tested positive for guaiac  He had a couple of episodes of frequency of urination but his urine analysis was negative in the emergency room as well He has no chest pain No shortness breath No blurred vision No double vision No unilateral weakness he ambulated quite easily back from the bathroom very coherent and alert oriented   EOMI NCAT S1-S2 no murmur rub or gallop Clinically clear no added sound Mild tenderness in the lower quadrant however no rebound no guarding and certainly abdomen is not firm or distended Moving all 4 limbs equally  power 5/5 Chest clinically clear no added sound No lower extremity edema Range of motion grossly intact

## 2015-08-18 ENCOUNTER — Ambulatory Visit (HOSPITAL_BASED_OUTPATIENT_CLINIC_OR_DEPARTMENT_OTHER): Payer: PPO | Admitting: Internal Medicine

## 2015-08-18 ENCOUNTER — Other Ambulatory Visit (HOSPITAL_BASED_OUTPATIENT_CLINIC_OR_DEPARTMENT_OTHER): Payer: PPO

## 2015-08-18 ENCOUNTER — Telehealth: Payer: Self-pay | Admitting: Medical Oncology

## 2015-08-18 ENCOUNTER — Encounter: Payer: Self-pay | Admitting: Internal Medicine

## 2015-08-18 ENCOUNTER — Telehealth: Payer: Self-pay | Admitting: Internal Medicine

## 2015-08-18 VITALS — BP 160/69 | HR 69 | Temp 98.3°F | Resp 18 | Ht 68.0 in | Wt 167.9 lb

## 2015-08-18 DIAGNOSIS — K5900 Constipation, unspecified: Secondary | ICD-10-CM

## 2015-08-18 DIAGNOSIS — C3411 Malignant neoplasm of upper lobe, right bronchus or lung: Secondary | ICD-10-CM

## 2015-08-18 DIAGNOSIS — C342 Malignant neoplasm of middle lobe, bronchus or lung: Secondary | ICD-10-CM

## 2015-08-18 DIAGNOSIS — K1370 Unspecified lesions of oral mucosa: Secondary | ICD-10-CM

## 2015-08-18 DIAGNOSIS — Z5111 Encounter for antineoplastic chemotherapy: Secondary | ICD-10-CM

## 2015-08-18 LAB — CBC WITH DIFFERENTIAL/PLATELET
BASO%: 0.2 % (ref 0.0–2.0)
BASOS ABS: 0 10*3/uL (ref 0.0–0.1)
EOS%: 4.7 % (ref 0.0–7.0)
Eosinophils Absolute: 0.2 10*3/uL (ref 0.0–0.5)
HEMATOCRIT: 32.8 % — AB (ref 38.4–49.9)
HEMOGLOBIN: 11 g/dL — AB (ref 13.0–17.1)
LYMPH#: 0.6 10*3/uL — AB (ref 0.9–3.3)
LYMPH%: 12.3 % — ABNORMAL LOW (ref 14.0–49.0)
MCH: 30.4 pg (ref 27.2–33.4)
MCHC: 33.5 g/dL (ref 32.0–36.0)
MCV: 90.6 fL (ref 79.3–98.0)
MONO#: 0.8 10*3/uL (ref 0.1–0.9)
MONO%: 15.8 % — AB (ref 0.0–14.0)
NEUT%: 67 % (ref 39.0–75.0)
NEUTROS ABS: 3.4 10*3/uL (ref 1.5–6.5)
Platelets: 334 10*3/uL (ref 140–400)
RBC: 3.62 10*6/uL — ABNORMAL LOW (ref 4.20–5.82)
RDW: 13.1 % (ref 11.0–14.6)
WBC: 5.1 10*3/uL (ref 4.0–10.3)

## 2015-08-18 LAB — COMPREHENSIVE METABOLIC PANEL
ALBUMIN: 3.5 g/dL (ref 3.5–5.0)
ALK PHOS: 57 U/L (ref 40–150)
ALT: 56 U/L — AB (ref 0–55)
AST: 59 U/L — AB (ref 5–34)
Anion Gap: 7 mEq/L (ref 3–11)
BUN: 10 mg/dL (ref 7.0–26.0)
CALCIUM: 9.6 mg/dL (ref 8.4–10.4)
CO2: 26 mEq/L (ref 22–29)
CREATININE: 0.7 mg/dL (ref 0.7–1.3)
Chloride: 99 mEq/L (ref 98–109)
EGFR: 86 mL/min/{1.73_m2} — ABNORMAL LOW (ref >90)
GLUCOSE: 94 mg/dL (ref 70–140)
POTASSIUM: 4.3 meq/L (ref 3.5–5.1)
SODIUM: 131 meq/L — AB (ref 136–145)
Total Bilirubin: 0.5 mg/dL (ref 0.20–1.20)
Total Protein: 6.6 g/dL (ref 6.4–8.3)

## 2015-08-18 LAB — RESEARCH LABS

## 2015-08-18 NOTE — Progress Notes (Signed)
[place] [phone]   [phone]  OFFICE PROGRESS NOTE  [name], MD [address] [place] [address]  DIAGNOSIS: stage IIIB (T1a, N3, M0) non-small cell lung cancer, adenocarcinoma with negative EGFR mutation, negative ALKgene translocation, negative ROS 1, but positive RET diagnosed in November 2016 and presented with right middle lobe lung nodule in addition to mediastinal and left supraclavicular lymphadenopathy.  PRIOR THERAPY: None  CURRENT THERAPY: Systemic chemotherapy with carboplatin for AUC of 5 and Alimta 500 MG/M2 every 3 weeks, first dose 08/11/2015.  INTERVAL HISTORY: BRAIDON CHERMAK 82 y.o. male returns to the clinic today for follow-up visit accompanied by his wife. The patient is currently undergoing systemic chemotherapy with carboplatin and Alimta status post 1 cycle given last week. He tolerated the first week of his treatment well except for mild fatigue. He denied having any significant fever or chills. He has no nausea or vomiting. The patient has mild sores in his mouth. He currently using Magic mouthwash. He also has a loose tooth and he would like it to be extracted by his dentist next week. He was seen recently at the emergency department for constipation and was started on treatment with sorbitol. He is feeling better.   MEDICAL HISTORY: Past Medical History  Diagnosis Date  . GERD (gastroesophageal reflux disease)   . Hiatal hernia   . Degenerative joint disease   . Rib fracture   . Diverticulosis   . Adenomatous colon polyp 05/2003  . Duodenal diverticulum   . Neuritis/radiculitis due to displacement of lumbar intervertebral disc   . Arthritis   . Pneumonia   . Hemorrhoids   . Encounter for antineoplastic chemotherapy 07/31/2015    ALLERGIES:  is allergic to benzonatate; lyrica; penicillins; pneumococcal vaccine polyvalent; tizanidine; and tramadol.  MEDICATIONS:  Current  Outpatient Prescriptions  Medication Sig Dispense Refill  . acetaminophen (TYLENOL) 650 MG CR tablet Take 650 mg by mouth every 6 (six) hours as needed for pain or fever.     Marland Kitchen alum & mag hydroxide-simeth (MAALOX PLUS) 400-400-40 MG/5ML suspension Take 5 mLs by mouth every 6 (six) hours as needed for indigestion.    Marland Kitchen aspirin 81 MG tablet Take 81 mg by mouth at bedtime.     Marland Kitchen azithromycin (ZITHROMAX) 250 MG tablet Take 1-2 tablets by mouth as directed. Reported on 08/14/2015  0  . benzonatate (TESSALON) 100 MG capsule Take 1 capsule (100 mg total) by mouth 3 (three) times daily as needed for cough. (Patient not taking: Reported on 08/14/2015) 20 capsule 0  . Calcium Carbonate-Vitamin D (CALCIUM + D PO) Take 1 capsule by mouth 2 (two) times daily.     . ciprofloxacin (CIPRO) 500 MG tablet Take 1 tablet (500 mg total) by mouth every 12 (twelve) hours. 20 tablet 0  . cromolyn (OPTICROM) 4 % ophthalmic solution Administer 1 drop to both eyes Four (4) times a day.    . denosumab (PROLIA) 60 MG/ML SOLN injection Inject 60 mg into the skin every 6 (six) months. Administer in upper arm, thigh, or abdomen    . dexamethasone (DECADRON) 4 MG tablet 4 mg by mouth twice a day the day before, day of and day after the chemotherapy every 3 weeks 40 tablet 1  . dextromethorphan-guaiFENesin (ROBITUSSIN-DM) 10-100 MG/5ML liquid Take 10 mLs by mouth every 4 (four) hours as needed for cough.    . Diphenhyd-Hydrocort-Nystatin (FIRST-DUKES MOUTHWASH) SUSP Use as directed 5 mLs  in the mouth or throat 3 (three) times daily. 332 mL 3  . folic acid (FOLVITE) 1 MG tablet Take 1 tablet (1 mg total) by mouth daily. 30 tablet 4  . hydrocortisone (PROCTOSOL HC) 2.5 % rectal cream Place 1 application rectally 2 (two) times daily as needed for hemorrhoids.     . lactobacillus acidophilus & bulgar (LACTINEX) chewable tablet Chew 1 tablet by mouth 2 (two) times daily. Store in [employer], [place]    . loratadine (CLARITIN) 10 MG tablet Take  10 mg by mouth.    . losartan (COZAAR) 50 MG tablet Take 50 mg by mouth daily.    . meloxicam (MOBIC) 7.5 MG tablet Take 7.5 mg by mouth daily as needed for pain.     . metroNIDAZOLE (FLAGYL) 500 MG tablet Take 1 tablet (500 mg total) by mouth 2 (two) times daily. 20 tablet 0  . Multiple Vitamin (MULTIVITAMIN) tablet Take 1 tablet by mouth daily.    . Omega-3 Fatty Acids (FISH OIL) 1000 MG CAPS Take 1,000 mg by mouth daily.     Marland Kitchen oxybutynin (DITROPAN) 5 MG tablet Take 5 mg by mouth daily.    . pantoprazole (PROTONIX) 40 MG tablet Take 40 mg by mouth daily.    . Probiotic Product (ALIGN) 4 MG CAPS Take 1 capsule by mouth daily.    . prochlorperazine (COMPAZINE) 10 MG tablet Take 1 tablet (10 mg total) by mouth every 6 (six) hours as needed for nausea or vomiting. 30 tablet 0  . saw palmetto 160 MG capsule Take 160 mg by mouth daily.    Marland Kitchen senna-docusate (SENOKOT-S) 8.6-50 MG tablet Take 1 tablet by mouth once. 15 tablet 0  . sorbitol 70 % SOLN Take 30 mLs by mouth daily as needed for moderate constipation. 473 mL 0  . Tamsulosin HCl (FLOMAX) 0.4 MG CAPS Take 0.4 mg by mouth daily.     No current facility-administered medications for this visit.    SURGICAL HISTORY:  Past Surgical History  Procedure Laterality Date  . Inguinal hernia repair    . Transurethral resection of prostate    . Splenectomy  2002  . Cataract extraction      bilateral  . Cholecystectomy  06/2003    REVIEW OF SYSTEMS:  A comprehensive review of systems was negative except for: Constitutional: positive for fatigue Ears, nose, mouth, throat, and face: positive for sore mouth Gastrointestinal: positive for constipation   PHYSICAL EXAMINATION: General appearance: alert, cooperative, fatigued and no distress Head: Normocephalic, without obvious abnormality, atraumatic Neck: no adenopathy, no JVD, supple, symmetrical, trachea midline and thyroid not enlarged, symmetric, no tenderness/mass/nodules Lymph nodes:  Cervical, supraclavicular, and axillary nodes normal. Resp: clear to auscultation bilaterally Back: symmetric, no curvature. ROM normal. No CVA tenderness. Cardio: regular rate and rhythm, S1, S2 normal, no murmur, click, rub or gallop GI: soft, non-tender; bowel sounds normal; no masses,  no organomegaly Extremities: extremities normal, atraumatic, no cyanosis or edema Neurologic: Alert and oriented X 3, normal strength and tone. Normal symmetric reflexes. Normal coordination and gait  ECOG PERFORMANCE STATUS: 1 - Symptomatic but completely ambulatory  Blood pressure 160/69, pulse 69, temperature 98.3 F (36.8 C), temperature source Oral, resp. rate 18, height _0  (1.727 m), weight 167 lb 14.4 oz (76.159 kg), SpO2 99 %.  LABORATORY DATA: Lab Results  Component Value Date   WBC 5.1 08/18/2015   HGB 11.0* 08/18/2015   HCT 32.8* 08/18/2015   MCV 90.6 08/18/2015   PLT 334 08/18/2015  Chemistry      Component Value Date/Time   NA 131* 08/18/2015 1013   NA 132* 08/14/2015 1151   K 4.3 08/18/2015 1013   K 4.7 08/14/2015 1151   CL 99* 08/14/2015 1151   CO2 26 08/18/2015 1013   CO2 29 08/14/2015 1151   BUN 10.0 08/18/2015 1013   BUN 13 08/14/2015 1151   CREATININE 0.7 08/18/2015 1013   CREATININE 0.51* 08/14/2015 1151      Component Value Date/Time   CALCIUM 9.6 08/18/2015 1013   CALCIUM 9.7 08/14/2015 1151   ALKPHOS 57 08/18/2015 1013   ALKPHOS 54 08/14/2015 1151   AST 59* 08/18/2015 1013   AST 25 08/14/2015 1151   ALT 56* 08/18/2015 1013   ALT 28 08/14/2015 1151   BILITOT 0.50 08/18/2015 1013   BILITOT 1.1 08/14/2015 1151       RADIOGRAPHIC STUDIES: Ct Abdomen Pelvis W Contrast  08/14/2015  CLINICAL DATA:  Initial encounter for lower abdominal pain with constipation for 3 days. Personal history of lung cancer. EXAM: CT ABDOMEN AND PELVIS WITH CONTRAST TECHNIQUE: Multidetector CT imaging of the abdomen and pelvis was performed using the standard protocol  following bolus administration of intravenous contrast. CONTRAST:  163m OMNIPAQUE IOHEXOL 300 MG/ML  SOLN COMPARISON:  03/05/2015. FINDINGS: Lower chest: 16 x 20 mm right middle lobe nodule has slightly more associated airspace disease than on a chest CT from 05/27/2015. Tiny pericardial effusion not substantially changed since 03/05/2015. Hepatobiliary: 10 mm low-density lesion in the dome of the liver is stable. Gallbladder is surgically absent. No intrahepatic or extrahepatic biliary dilation. Pancreas: Pancreas is diffusely atrophic without dilatation of the main duct. Spleen: Spleen is surgically absent with multiple splenules evident. Adrenals/Urinary Tract: No adrenal nodule or mass. Stable 11 mm interpolar right renal cyst. 7 mm nonobstructing stone identified lower pole left kidney. No evidence for hydroureter. Urinary bladder is distended. Stomach/Bowel: Stomach is nondistended. No gastric wall thickening. No evidence of outlet obstruction. Large duodenum diverticulum noted. No small bowel wall thickening. No small bowel dilatation. The terminal ileum is normal. The appendix is not visualized, but there is no edema or inflammation in the region of the cecum. Diverticular changes are noted in the left colon without evidence of diverticulitis. Prominent circumferential wall thickening is identified in the rectum (see image 95 series 2). Vascular/Lymphatic: There is abdominal aortic atherosclerosis without aneurysm. There is no gastrohepatic or hepatoduodenal ligament lymphadenopathy. No intraperitoneal or retroperitoneal lymphadenopy. No pelvic sidewall lymphadenopathy. Reproductive: Prostate gland is enlarged. Other: No intraperitoneal free fluid. Musculoskeletal: Bone windows reveal no worrisome lytic or sclerotic osseous lesions. IMPRESSION: 1. Circumferential wall thickening and edema in the rectum may be related to an infectious or inflammatory proctitis. No substantial perirectal edema or  inflammation. 2. Known right middle lobe lesion with interval increase and adjacent airspace disease. Progressive neoplasm is not excluded. 3. Status post splenectomy with multiple splenules evident. 4. 7 mm nonobstructing stone lower pole left kidney with stable 11 mm interpolar right renal cyst. 5. Prostatomegaly. 6. Abdominal aortic atherosclerosis. Electronically Signed   By: EMisty StanleyM.D.   On: 08/14/2015 14:06    ASSESSMENT AND PLAN: This is a very pleasant 82years old white male with a stage IIIB non-small cell lung cancer with negative EGFR mutation, negative ALK gene translocation and PDL 1 expression 10%. The patient is not eligible for concurrent chemoradiation because of the large radiotherapy field. The patient is currently undergoing systemic chemotherapy with carboplatin and Alimta status post 1  cycle. He tolerated the first cycle of his treatment fairly well. I recommended for him to proceed with cycle #2 in 2 weeks as scheduled. I will arrange for the patient to have a Port-A-Cath placement by interventional radiology for IV access. The patient would come back for follow-up visit in 2 weeks for reevaluation and management of any adverse effect of his treatment before starting cycle #2. I also advised the patient that he can go ahead and has his loose tooth extracted next week if his blood count is good. For constipation, he will continue on sorbitol. For the mouth sores, the patient will continue on Magic myelosuppression and addition to Biotene and saltwater. He was advised to call immediately if he has any concerning symptoms in the interval. The patient voices understanding of current disease status and treatment options and is in agreement with the current care plan.  All questions were answered. The patient knows to call the clinic with any problems, questions or concerns. We can certainly see the patient much sooner if necessary.  I spent 15 minutes counseling the patient  face to face. The total time spent in the appointment was 25 minutes.  Disclaimer: This note was dictated with voice recognition software. Similar sounding words can inadvertently be transcribed and may not be corrected upon review.

## 2015-08-18 NOTE — Telephone Encounter (Signed)
Pt stated he missed a call ,. I told him it may have been radiology to schedule port  and I left message for IR to call him .

## 2015-08-18 NOTE — Telephone Encounter (Signed)
Pts appts already scheduled as ordered per 1/4 pof. Gv pt AVS report and advised him the radiology will call him directly to schedule.

## 2015-08-19 ENCOUNTER — Other Ambulatory Visit: Payer: Self-pay | Admitting: Medical Oncology

## 2015-08-20 ENCOUNTER — Ambulatory Visit (INDEPENDENT_AMBULATORY_CARE_PROVIDER_SITE_OTHER): Payer: PPO | Admitting: Gastroenterology

## 2015-08-20 ENCOUNTER — Telehealth: Payer: Self-pay | Admitting: Internal Medicine

## 2015-08-20 ENCOUNTER — Other Ambulatory Visit: Payer: Self-pay | Admitting: Radiology

## 2015-08-20 ENCOUNTER — Encounter: Payer: Self-pay | Admitting: Gastroenterology

## 2015-08-20 ENCOUNTER — Telehealth: Payer: Self-pay | Admitting: *Deleted

## 2015-08-20 VITALS — BP 116/66 | HR 80 | Ht 68.0 in | Wt 168.1 lb

## 2015-08-20 DIAGNOSIS — R933 Abnormal findings on diagnostic imaging of other parts of digestive tract: Secondary | ICD-10-CM

## 2015-08-20 DIAGNOSIS — K5900 Constipation, unspecified: Secondary | ICD-10-CM | POA: Diagnosis not present

## 2015-08-20 DIAGNOSIS — R195 Other fecal abnormalities: Secondary | ICD-10-CM

## 2015-08-20 NOTE — Progress Notes (Signed)
    History of Present Illness: This is an 82 year old male with constipation and an abnormal CT scan of the rectum. He was evaluated in the ED on 08/14/2015 with complaints of constipation. He recently started chemotherapy for lung cancer. He has a history of chronic constipation. CT scan performed with below results. He was treated with sorbitol and Senokot. Lactulose was discontinued. Occult positive stool was noted in the ED but no overt bleeding. He previously underwent colonoscopy in November 2009 showing diverticulosis, internal hemorrhoids and small adenomatous polyps. Denies weight loss, abdominal pain, diarrhea, change in stool caliber, melena, hematochezia, nausea, vomiting, dysphagia, reflux symptoms, chest pain.  IMPRESSION: 1. Circumferential wall thickening and edema in the rectum may be related to an infectious or inflammatory proctitis. No substantial perirectal edema or inflammation. 2. Known right middle lobe lesion with interval increase and adjacent airspace disease. Progressive neoplasm is not excluded. 3. Status post splenectomy with multiple splenules evident. 4. 7 mm nonobstructing stone lower pole left kidney with stable 11 mm interpolar right renal cyst. 5. Prostatomegaly. 6. Abdominal aortic atherosclerosis.  Current Medications, Allergies, Past Medical History, Past Surgical History, Family History and Social History were reviewed in [employer] record.  Physical Exam: General: Well developed, well nourished, frail, elderly, no acute distress Head: Normocephalic and atraumatic Eyes:  sclerae anicteric, EOMI Ears: Normal auditory acuity Mouth: No deformity or lesions Lungs: Clear throughout to auscultation Heart: Regular rate and rhythm; no murmurs, rubs or bruits Abdomen: Soft, non tender and non distended. No masses, hepatosplenomegaly or hernias noted. Normal Bowel sounds Musculoskeletal: Symmetrical with no gross deformities  Pulses:   Normal pulses noted Extremities: No clubbing, cyanosis, edema or deformities noted Neurological: Alert oriented x 4, grossly nonfocal Psychological:  Alert and cooperative. Normal mood and affect  Assessment and Recommendations:  1. Exacerbation of chronic constipation, thickened rectal wall on CT, occult blood in stool. He could have a stercoral ulcer or proctitis secondary to constipation. Suspect the occult blood in his stools secondary to the rectal process. Continue sorbitol 30 mg daily and Senokot daily as needed. It might be difficult for him to tolerate full colonoscopy and full bowel prep so we will proceed with flexible sigmoidoscopy to further evaluate.

## 2015-08-20 NOTE — Telephone Encounter (Signed)
Aware of cancelled 1/11 lab

## 2015-08-20 NOTE — Patient Instructions (Signed)
Stay on Sorbitol for constipation daily  You have been scheduled for a flexible sigmoidoscopy. Please follow the written instructions given to you at your visit today. If you use inhalers (even only as needed), please bring them with you on the day of your procedure.

## 2015-08-20 NOTE — Telephone Encounter (Signed)
"  I saw GI Dr. Lucio Edward this morning.  He wanted me to call Dr. Julien Nordmann to find out if it's okay for me to have a Flex Sigmoidoscopy scheduled on September 20, 2015.  Call me at [phone] so I'll know to let Dr. Fuller Plan know asap.  I do not want to wait until F/U visit with Dr. Julien Nordmann.

## 2015-08-23 ENCOUNTER — Telehealth: Payer: Self-pay | Admitting: *Deleted

## 2015-08-23 DIAGNOSIS — Q8901 Asplenia (congenital): Secondary | ICD-10-CM | POA: Diagnosis not present

## 2015-08-23 DIAGNOSIS — R509 Fever, unspecified: Secondary | ICD-10-CM | POA: Diagnosis not present

## 2015-08-23 DIAGNOSIS — J329 Chronic sinusitis, unspecified: Secondary | ICD-10-CM | POA: Diagnosis not present

## 2015-08-23 NOTE — Telephone Encounter (Signed)
Patient states he was to have PAC inserted on Wednesday 08/25/15.  He has already cancelled that as he "will not be feeling up to it."  They were going to go ahead and do lab work at Radiology since he was already going to be there.  Does he instead need to come in to [facility] and have labs done.?   Patient call back is [phone]  Or [phone].

## 2015-08-23 NOTE — Telephone Encounter (Signed)
VM left by pt , " I need to know if I should take my medicine before I go to the Emergency room?" pt requested call back. Returned pt's , wife advised he was outside on the steps. Pt came to phone. Pt states " Ive already taken my medicine, so I guess it dont matter" Pt clarified he took an over the counter cold medicine and he will be going to see his PCP today.  Pt cancelled PAC placement on 1/11, as he is  "not up to it" Pt would also like to cancel lab appt 1/11.  Discussed with pt to f/u as scheduled with MD on 1/18. No further concerns.

## 2015-08-25 ENCOUNTER — Other Ambulatory Visit: Payer: PPO

## 2015-08-25 ENCOUNTER — Inpatient Hospital Stay (HOSPITAL_COMMUNITY): Admission: RE | Admit: 2015-08-25 | Payer: PPO | Source: Ambulatory Visit

## 2015-08-25 ENCOUNTER — Ambulatory Visit (HOSPITAL_COMMUNITY): Payer: PPO

## 2015-08-26 NOTE — Telephone Encounter (Addendum)
Patient called reporting has sinus infection. On levaquin and Mucinex DM.  Concerned about next weeks appointments.  Advised he FF, complete all of antibiotics, monitor temperature and call if >100.5.  Otherwise he should be fine to proceed as scheduled next week.Sean Cooke

## 2015-08-27 ENCOUNTER — Other Ambulatory Visit: Payer: Self-pay | Admitting: Physician Assistant

## 2015-08-30 ENCOUNTER — Encounter (HOSPITAL_COMMUNITY): Payer: Self-pay

## 2015-08-30 ENCOUNTER — Other Ambulatory Visit: Payer: Self-pay | Admitting: Internal Medicine

## 2015-08-30 ENCOUNTER — Ambulatory Visit (HOSPITAL_COMMUNITY)
Admission: RE | Admit: 2015-08-30 | Discharge: 2015-08-30 | Disposition: A | Discharge status: Home / Self Care | Payer: PPO | Source: Ambulatory Visit | Attending: Internal Medicine | Admitting: Internal Medicine

## 2015-08-30 DIAGNOSIS — K449 Diaphragmatic hernia without obstruction or gangrene: Secondary | ICD-10-CM | POA: Insufficient documentation

## 2015-08-30 DIAGNOSIS — Z9081 Acquired absence of spleen: Secondary | ICD-10-CM | POA: Insufficient documentation

## 2015-08-30 DIAGNOSIS — Z7982 Long term (current) use of aspirin: Secondary | ICD-10-CM | POA: Insufficient documentation

## 2015-08-30 DIAGNOSIS — C3411 Malignant neoplasm of upper lobe, right bronchus or lung: Secondary | ICD-10-CM

## 2015-08-30 DIAGNOSIS — M1990 Unspecified osteoarthritis, unspecified site: Secondary | ICD-10-CM | POA: Insufficient documentation

## 2015-08-30 DIAGNOSIS — Z880 Allergy status to penicillin: Secondary | ICD-10-CM | POA: Insufficient documentation

## 2015-08-30 DIAGNOSIS — N400 Enlarged prostate without lower urinary tract symptoms: Secondary | ICD-10-CM | POA: Diagnosis not present

## 2015-08-30 DIAGNOSIS — Z87891 Personal history of nicotine dependence: Secondary | ICD-10-CM | POA: Insufficient documentation

## 2015-08-30 DIAGNOSIS — I700 Atherosclerosis of aorta: Secondary | ICD-10-CM | POA: Diagnosis not present

## 2015-08-30 DIAGNOSIS — K219 Gastro-esophageal reflux disease without esophagitis: Secondary | ICD-10-CM | POA: Diagnosis not present

## 2015-08-30 DIAGNOSIS — M5116 Intervertebral disc disorders with radiculopathy, lumbar region: Secondary | ICD-10-CM | POA: Insufficient documentation

## 2015-08-30 DIAGNOSIS — Z5111 Encounter for antineoplastic chemotherapy: Secondary | ICD-10-CM | POA: Diagnosis not present

## 2015-08-30 DIAGNOSIS — C3490 Malignant neoplasm of unspecified part of unspecified bronchus or lung: Secondary | ICD-10-CM | POA: Diagnosis not present

## 2015-08-30 LAB — COMPREHENSIVE METABOLIC PANEL
ALBUMIN: 3.7 g/dL (ref 3.5–5.0)
ALT: 25 U/L (ref 17–63)
ANION GAP: 9 (ref 5–15)
AST: 28 U/L (ref 15–41)
Alkaline Phosphatase: 62 U/L (ref 38–126)
BUN: 8 mg/dL (ref 6–20)
CHLORIDE: 100 mmol/L — AB (ref 101–111)
CO2: 24 mmol/L (ref 22–32)
Calcium: 9.8 mg/dL (ref 8.9–10.3)
Creatinine, Ser: 0.55 mg/dL — ABNORMAL LOW (ref 0.61–1.24)
GFR calc Af Amer: >60 mL/min (ref >60)
GFR calc non Af Amer: >60 mL/min (ref >60)
GLUCOSE: 104 mg/dL — AB (ref 65–99)
POTASSIUM: 4.1 mmol/L (ref 3.5–5.1)
SODIUM: 133 mmol/L — AB (ref 135–145)
TOTAL PROTEIN: 6.8 g/dL (ref 6.5–8.1)
Total Bilirubin: 0.8 mg/dL (ref 0.3–1.2)

## 2015-08-30 LAB — CBC WITH DIFFERENTIAL/PLATELET
BASOS ABS: 0 10*3/uL (ref 0.0–0.1)
BASOS PCT: 0 %
EOS ABS: 0.1 10*3/uL (ref 0.0–0.7)
EOS PCT: 1 %
HCT: 31.9 % — ABNORMAL LOW (ref 39.0–52.0)
Hemoglobin: 11 g/dL — ABNORMAL LOW (ref 13.0–17.0)
Lymphocytes Relative: 10 %
Lymphs Abs: 1.1 10*3/uL (ref 0.7–4.0)
MCH: 31.6 pg (ref 26.0–34.0)
MCHC: 34.5 g/dL (ref 30.0–36.0)
MCV: 91.7 fL (ref 78.0–100.0)
MONO ABS: 2.5 10*3/uL — AB (ref 0.1–1.0)
MONOS PCT: 24 %
NEUTROS ABS: 6.6 10*3/uL (ref 1.7–7.7)
Neutrophils Relative %: 65 %
PLATELETS: 465 10*3/uL — AB (ref 150–400)
RBC: 3.48 MIL/uL — ABNORMAL LOW (ref 4.22–5.81)
RDW: 14.2 % (ref 11.5–15.5)
WBC: 10.3 10*3/uL (ref 4.0–10.5)

## 2015-08-30 LAB — PROTIME-INR
INR: 1.11 (ref 0.00–1.49)
Prothrombin Time: 14.5 seconds (ref 11.6–15.2)

## 2015-08-30 MED ORDER — CLINDAMYCIN PHOSPHATE 900 MG/50ML IV SOLN
900.0000 mg | Freq: Once | INTRAVENOUS | Status: AC
  Administered 2015-08-30: 900 mg via INTRAVENOUS
  Filled 2015-08-30 (×2): qty 50

## 2015-08-30 MED ORDER — MIDAZOLAM HCL 2 MG/2ML IJ SOLN
INTRAMUSCULAR | Status: DC
Start: 2015-08-30 — End: 2015-08-31
  Filled 2015-08-30: qty 4

## 2015-08-30 MED ORDER — HEPARIN SOD (PORK) LOCK FLUSH 100 UNIT/ML IV SOLN
INTRAVENOUS | Status: AC
  Filled 2015-08-30: qty 5

## 2015-08-30 MED ORDER — FENTANYL CITRATE (PF) 100 MCG/2ML IJ SOLN
INTRAMUSCULAR | Status: AC | PRN
  Administered 2015-08-30: 25 ug via INTRAVENOUS

## 2015-08-30 MED ORDER — LIDOCAINE HCL 1 % IJ SOLN
INTRAMUSCULAR | Status: DC
Start: 2015-08-30 — End: 2015-08-31
  Filled 2015-08-30: qty 20

## 2015-08-30 MED ORDER — HEPARIN SOD (PORK) LOCK FLUSH 100 UNIT/ML IV SOLN
INTRAVENOUS | Status: AC | PRN
  Administered 2015-08-30: 500 [IU]

## 2015-08-30 MED ORDER — FENTANYL CITRATE (PF) 100 MCG/2ML IJ SOLN
INTRAMUSCULAR | Status: AC
  Filled 2015-08-30: qty 2

## 2015-08-30 MED ORDER — MIDAZOLAM HCL 2 MG/2ML IJ SOLN
INTRAMUSCULAR | Status: AC | PRN
  Administered 2015-08-30 (×2): 0.5 mg via INTRAVENOUS

## 2015-08-30 MED ORDER — SODIUM CHLORIDE 0.9 % IV SOLN
INTRAVENOUS | Status: DC
  Administered 2015-08-30: 12:00:00 via INTRAVENOUS

## 2015-08-30 NOTE — Discharge Instructions (Signed)
Moderate Conscious Sedation, Adult, Care After Refer to this sheet in the next few weeks. These instructions provide you with information on caring for yourself after your procedure. Your health care provider may also give you more specific instructions. Your treatment has been planned according to current medical practices, but problems sometimes occur. Call your health care provider if you have any problems or questions after your procedure. WHAT TO EXPECT AFTER THE PROCEDURE  After your procedure:  You may feel sleepy, clumsy, and have poor balance for several hours.  Vomiting may occur if you eat too soon after the procedure. HOME CARE INSTRUCTIONS  Do not participate in any activities where you could become injured for at least 24 hours. Do not:  Drive.  Swim.  Ride a bicycle.  Operate heavy machinery.  Cook.  Use power tools.  Climb ladders.  Work from a high place.  Do not make important decisions or sign legal documents until you are improved.  If you vomit, drink water, juice, or soup when you can drink without vomiting. Make sure you have little or no nausea before eating solid foods.  Only take over-the-counter or prescription medicines for pain, discomfort, or fever as directed by your health care provider.  Make sure you and your family fully understand everything about the medicines given to you, including what side effects may occur.  You should not drink alcohol, take sleeping pills, or take medicines that cause drowsiness for at least 24 hours.  If you smoke, do not smoke without supervision.  If you are feeling better, you may resume normal activities 24 hours after you were sedated.  Keep all appointments with your health care provider. SEEK MEDICAL CARE IF:  Your skin is pale or bluish in color.  You continue to feel nauseous or vomit.  Your pain is getting worse and is not helped by medicine.  You have bleeding or swelling.  You are still  sleepy or feeling clumsy after 24 hours. SEEK IMMEDIATE MEDICAL CARE IF:  You develop a rash.  You have difficulty breathing.  You develop any type of allergic problem.  You have a fever. MAKE SURE YOU:  Understand these instructions.  Will watch your condition.  Will get help right away if you are not doing well or get worse.   This information is not intended to replace advice given to you by your health care provider. Make sure you discuss any questions you have with your health care provider.   Document Released: 05/21/2013 Document Revised: 08/21/2014 Document Reviewed: 05/21/2013 Elsevier Interactive Patient Education 2016 Elsevier Inc[employer]     Moderate Conscious Sedation, Adult Sedation is the use of medicines to promote relaxation and relieve discomfort and anxiety. Moderate conscious sedation is a type of sedation. Under moderate conscious sedation you are less alert than normal but are still able to respond to instructions or stimulation. Moderate conscious sedation is used during short medical and dental procedures. It is milder than deep sedation or general anesthesia and allows you to return to your regular activities sooner. LET [facility] CARE PROVIDER KNOW ABOUT:   Any allergies you have.  All medicines you are taking, including vitamins, herbs, eye drops, creams, and over-the-counter medicines.  Use of steroids (by mouth or creams).  Previous problems you or members of your family have had with the use of anesthetics.  Any blood disorders you have.  Previous surgeries you have had.  Medical conditions you have.  Possibility of pregnancy, if this  applies.  Use of cigarettes, alcohol, or illegal drugs. RISKS AND COMPLICATIONS Generally, this is a safe procedure. However, as with any procedure, problems can occur. Possible problems include:  Oversedation.  Trouble breathing on your own. You may need to have a breathing tube until you are awake and  breathing on your own.  Allergic reaction to any of the medicines used for the procedure. BEFORE THE PROCEDURE  You may have blood tests done. These tests can help show how well your kidneys and liver are working. They can also show how well your blood clots.  A physical exam will be done.  Only take medicines as directed by your health care provider. You may need to stop taking medicines (such as blood thinners, aspirin, or nonsteroidal anti-inflammatory drugs) before the procedure.   Do not eat or drink at least 6 hours before the procedure or as directed by your health care provider.  Arrange for a responsible adult, family member, or friend to take you home after the procedure. He or she should stay with you for at least 24 hours after the procedure, until the medicine has worn off. PROCEDURE   An intravenous (IV) catheter will be inserted into one of your veins. Medicine will be able to flow directly into your body through this catheter. You may be given medicine through this tube to help prevent pain and help you relax.  The medical or dental procedure will be done. AFTER THE PROCEDURE  You will stay in a recovery area until the medicine has worn off. Your blood pressure and pulse will be checked.   Depending on the procedure you had, you may be allowed to go home when you can tolerate liquids and your pain is under control.   This information is not intended to replace advice given to you by your health care provider. Make sure you discuss any questions you have with your health care provider.   Document Released: 04/25/2001 Document Revised: 08/21/2014 Document Reviewed: 04/07/2013 Elsevier Interactive Patient Education 2016 [place] Insertion, Care After Refer to this sheet in the next few weeks. These instructions provide you with information on caring for yourself after your procedure. Your health care provider may also give you more specific  instructions. Your treatment has been planned according to current medical practices, but problems sometimes occur. Call your health care provider if you have any problems or questions after your procedure. WHAT TO EXPECT AFTER THE PROCEDURE After your procedure, it is typical to have the following:   Discomfort at the port insertion site. Ice packs to the area will help.  Bruising on the skin over the port. This will subside in 3-4 days. HOME CARE INSTRUCTIONS  After your port is placed, you will get a manufacturer's information card. The card has information about your port. Keep this card with you at all times.   Know what kind of port you have. There are many types of ports available.   Wear a medical alert bracelet in case of an emergency. This can help alert health care workers that you have a port.   The port can stay in for as long as your health care provider believes it is necessary.   A home health care nurse may give medicines and take care of the port.   You or a family member can get special training and directions for giving medicine and taking care of the port at home.  SEEK MEDICAL CARE IF:   Your  port does not flush or you are unable to get a blood return.   You have a fever or chills. SEEK IMMEDIATE MEDICAL CARE IF:  You have new fluid or pus coming from your incision.   You notice a bad smell coming from your incision site.   You have swelling, pain, or more redness at the incision or port site.   You have chest pain or shortness of breath.   This information is not intended to replace advice given to you by your health care provider. Make sure you discuss any questions you have with your health care provider.   Document Released: 05/21/2013 Document Revised: 08/05/2013 Document Reviewed: 05/21/2013 Elsevier Interactive Patient Education 2016 [place] An implanted port is a type of central line that is placed under  the skin. Central lines are used to provide IV access when treatment or nutrition needs to be given through a person's veins. Implanted ports are used for long-term IV access. An implanted port may be placed because:   You need IV medicine that would be irritating to the small veins in your hands or arms.   You need long-term IV medicines, such as antibiotics.   You need IV nutrition for a long period.   You need frequent blood draws for lab tests.   You need dialysis.  Implanted ports are usually placed in the chest area, but they can also be placed in the upper arm, the abdomen, or the leg. An implanted port has two main parts:   Reservoir. The reservoir is round and will appear as a small, raised area under your skin. The reservoir is the part where a needle is inserted to give medicines or draw blood.   Catheter. The catheter is a thin, flexible tube that extends from the reservoir. The catheter is placed into a large vein. Medicine that is inserted into the reservoir goes into the catheter and then into the vein.  HOW WILL I CARE FOR MY INCISION SITE? Do not get the incision site wet. Bathe or shower as directed by your health care provider.  HOW IS MY PORT ACCESSED? Special steps must be taken to access the port:   Before the port is accessed, a numbing cream can be placed on the skin. This helps numb the skin over the port site.   Your health care provider uses a sterile technique to access the port.  Your health care provider must put on a mask and sterile gloves.  The skin over your port is cleaned carefully with an antiseptic and allowed to dry.  The port is gently pinched between sterile gloves, and a needle is inserted into the port.  Only "non-coring" port needles should be used to access the port. Once the port is accessed, a blood return should be checked. This helps ensure that the port is in the vein and is not clogged.   If your port needs to remain  accessed for a constant infusion, a clear (transparent) bandage will be placed over the needle site. The bandage and needle will need to be changed every week, or as directed by your health care provider.   Keep the bandage covering the needle clean and dry. Do not get it wet. Follow your health care provider's instructions on how to take a shower or bath while the port is accessed.   If your port does not need to stay accessed, no bandage is needed over the port.  WHAT IS  FLUSHING? Flushing helps keep the port from getting clogged. Follow your health care provider's instructions on how and when to flush the port. Ports are usually flushed with saline solution or a medicine called heparin. The need for flushing will depend on how the port is used.   If the port is used for intermittent medicines or blood draws, the port will need to be flushed:   After medicines have been given.   After blood has been drawn.   As part of routine maintenance.   If a constant infusion is running, the port may not need to be flushed.  HOW LONG WILL MY PORT STAY IMPLANTED? The port can stay in for as long as your health care provider thinks it is needed. When it is time for the port to come out, surgery will be done to remove it. The procedure is similar to the one performed when the port was put in.  WHEN SHOULD I SEEK IMMEDIATE MEDICAL CARE? When you have an implanted port, you should seek immediate medical care if:   You notice a bad smell coming from the incision site.   You have swelling, redness, or drainage at the incision site.   You have more swelling or pain at the port site or the surrounding area.   You have a fever that is not controlled with medicine.   This information is not intended to replace advice given to you by your health care provider. Make sure you discuss any questions you have with your health care provider.   Document Released: 07/31/2005 Document Revised: 05/21/2013  Document Reviewed: 04/07/2013 Elsevier Interactive Patient Education [employer].

## 2015-08-30 NOTE — H&P (Signed)
Chief Complaint: Patient was seen in consultation today for  Port-A-Cath placement  Referring Physician(s): Mohamed,Mohamed  History of Present Illness: Sean Cooke is an 82 y.o. male with history of stage IIIB (T1a, N3, M0) non-small cell lung cancer, adenocarcinoma with negative EGFR mutation, negative ALKgene translocation, negative ROS 1, but positive RET diagnosed in November 2016 who presented with right middle lobe lung nodule in addition to mediastinal and left supraclavicular lymphadenopathy.He presents today for Port-A-Cath placement for chemotherapy.  Past Medical History  Diagnosis Date  . GERD (gastroesophageal reflux disease)   . Hiatal hernia   . Degenerative joint disease   . Rib fracture   . Diverticulosis   . Adenomatous colon polyp 05/2003  . Duodenal diverticulum   . Neuritis/radiculitis due to displacement of lumbar intervertebral disc   . Arthritis   . Pneumonia   . Hemorrhoids   . Encounter for antineoplastic chemotherapy 07/31/2015  . Lung cancer [facility])     Past Surgical History  Procedure Laterality Date  . Inguinal hernia repair    . Transurethral resection of prostate    . Splenectomy  2002  . Cataract extraction      bilateral  . Cholecystectomy  06/2003    Allergies: Benzonatate; Lyrica; Penicillins; Pneumococcal vaccine polyvalent; Tizanidine; and Tramadol  Medications: Prior to Admission medications   Medication Sig Start Date End Date Taking? Authorizing Provider  acetaminophen (TYLENOL) 650 MG CR tablet Take 650 mg by mouth every 6 (six) hours as needed for pain or fever. Reported on 08/18/2015   Yes Historical Provider, MD  alum & mag hydroxide-simeth (MAALOX PLUS) 400-400-40 MG/5ML suspension Take 5 mLs by mouth every 6 (six) hours as needed for indigestion. Reported on 08/20/2015   Yes Historical Provider, MD  aspirin 81 MG tablet Take 81 mg by mouth at bedtime.    Yes Historical Provider, MD  azithromycin (ZITHROMAX) 250 MG  tablet Take 1-2 tablets by mouth as directed. Reported on 08/20/2015 08/04/15  Yes Historical Provider, MD  Calcium Carbonate-Vitamin D (CALCIUM + D PO) Take 1 capsule by mouth 2 (two) times daily.    Yes Historical Provider, MD  ciprofloxacin (CIPRO) 500 MG tablet Take 1 tablet (500 mg total) by mouth every 12 (twelve) hours. 08/14/15  Yes Marella Chimes, PA-C  cromolyn (OPTICROM) 4 % ophthalmic solution Administer 1 drop to both eyes Four (4) times a day.   Yes Historical Provider, MD  dexamethasone (DECADRON) 4 MG tablet 4 mg by mouth twice a day the day before, day of and day after the chemotherapy every 3 weeks 07/30/15  Yes Curt Bears, MD  Diphenhyd-Hydrocort-Nystatin (FIRST-DUKES MOUTHWASH) SUSP Use as directed 5 mLs in the mouth or throat 3 (three) times daily. 08/06/15  Yes Curt Bears, MD  folic acid (FOLVITE) 1 MG tablet Take 1 tablet (1 mg total) by mouth daily. 07/30/15  Yes Curt Bears, MD  hydrocortisone (PROCTOSOL HC) 2.5 % rectal cream Place 1 application rectally 2 (two) times daily as needed for hemorrhoids.    Yes Historical Provider, MD  lactobacillus acidophilus & bulgar (LACTINEX) chewable tablet Chew 1 tablet by mouth 2 (two) times daily. Store in [place]   Yes Historical Provider, MD  loratadine (CLARITIN) 10 MG tablet Take 10 mg by mouth.   Yes Historical Provider, MD  losartan (COZAAR) 50 MG tablet Take 50 mg by mouth daily.   Yes Historical Provider, MD  meloxicam (MOBIC) 7.5 MG tablet Take 7.5 mg by mouth daily as  needed for pain. Reported on 08/18/2015   Yes Historical Provider, MD  metroNIDAZOLE (FLAGYL) 500 MG tablet Take 1 tablet (500 mg total) by mouth 2 (two) times daily. 08/14/15  Yes Marella Chimes, PA-C  Multiple Vitamin (MULTIVITAMIN) tablet Take 1 tablet by mouth daily.   Yes Historical Provider, MD  Omega-3 Fatty Acids (FISH OIL) 1000 MG CAPS Take 1,000 mg by mouth daily.    Yes Historical Provider, MD  oxybutynin (DITROPAN) 5 MG tablet  Take 5 mg by mouth daily.   Yes Historical Provider, MD  pantoprazole (PROTONIX) 40 MG tablet Take 40 mg by mouth daily.   Yes Historical Provider, MD  Probiotic Product (ALIGN) 4 MG CAPS Take 1 capsule by mouth daily.   Yes Historical Provider, MD  prochlorperazine (COMPAZINE) 10 MG tablet Take 1 tablet (10 mg total) by mouth every 6 (six) hours as needed for nausea or vomiting. 07/30/15  Yes Curt Bears, MD  saw palmetto 160 MG capsule Take 160 mg by mouth daily.   Yes Historical Provider, MD  sorbitol 70 % SOLN Take 30 mLs by mouth daily as needed for moderate constipation. 08/14/15  Yes Nita Sells, MD  Tamsulosin HCl (FLOMAX) 0.4 MG CAPS Take 0.4 mg by mouth daily.   Yes Historical Provider, MD  benzonatate (TESSALON) 100 MG capsule Take 1 capsule (100 mg total) by mouth 3 (three) times daily as needed for cough. Patient not taking: Reported on 08/14/2015 10/07/14   Venetia Maxon Rama, MD  denosumab (PROLIA) 60 MG/ML SOLN injection Inject 60 mg into the skin every 6 (six) months. Administer in upper arm, thigh, or abdomen    Historical Provider, MD  dextromethorphan-guaiFENesin (ROBITUSSIN-DM) 10-100 MG/5ML liquid Take 10 mLs by mouth every 4 (four) hours as needed for cough. Reported on 08/18/2015    Historical Provider, MD     Family History  Problem Relation Age of Onset  . Diabetes Father   . Colon cancer Neg Hx     Social History   Social History  . Marital Status: Married    Spouse Name: N/A  . Number of Children: 0  . Years of Education: N/A   Occupational History  . retired    Social History Main Topics  . Smoking status: Former Smoker    Types: Cigarettes, Pipe    Quit date: 08/14/1988  . Smokeless tobacco: Never Used  . Alcohol Use: 0.0 oz/week    0 Standard drinks or equivalent per week     Comment: rarely wine  . Drug Use: No  . Sexual Activity: Not Asked   Other Topics Concern  . None   Social History Narrative      Review of Systems    Constitutional: Positive for fatigue. Negative for fever.  HENT:       Recovering from recent sinus infection  Respiratory: Negative for shortness of breath.        Occasional cough  Cardiovascular: Negative for chest pain.  Gastrointestinal: Negative for nausea, vomiting, abdominal pain and blood in stool.  Genitourinary: Negative for dysuria and hematuria.  Musculoskeletal: Positive for back pain.  Neurological: Negative for headaches.    Vital Signs: Blood pressure 144/67, temperature 98.7, heart rate 89, respirations 18, oxygen saturation is 100% room air   Physical Exam  Constitutional: He is oriented to person, place, and time.  Elderly white male in no acute distress  Cardiovascular: Normal rate and regular rhythm.   Pulmonary/Chest: Effort normal.  Abdominal: Soft. Bowel sounds are normal.  There is no tenderness.  Musculoskeletal: Normal range of motion. He exhibits edema.  Neurological: He is alert and oriented to person, place, and time.    Mallampati Score:     Imaging: Ct Abdomen Pelvis W Contrast  08/14/2015  CLINICAL DATA:  Initial encounter for lower abdominal pain with constipation for 3 days. Personal history of lung cancer. EXAM: CT ABDOMEN AND PELVIS WITH CONTRAST TECHNIQUE: Multidetector CT imaging of the abdomen and pelvis was performed using the standard protocol following bolus administration of intravenous contrast. CONTRAST:  160m OMNIPAQUE IOHEXOL 300 MG/ML  SOLN COMPARISON:  03/05/2015. FINDINGS: Lower chest: 16 x 20 mm right middle lobe nodule has slightly more associated airspace disease than on a chest CT from 05/27/2015. Tiny pericardial effusion not substantially changed since 03/05/2015. Hepatobiliary: 10 mm low-density lesion in the dome of the liver is stable. Gallbladder is surgically absent. No intrahepatic or extrahepatic biliary dilation. Pancreas: Pancreas is diffusely atrophic without dilatation of the main duct. Spleen: Spleen is surgically  absent with multiple splenules evident. Adrenals/Urinary Tract: No adrenal nodule or mass. Stable 11 mm interpolar right renal cyst. 7 mm nonobstructing stone identified lower pole left kidney. No evidence for hydroureter. Urinary bladder is distended. Stomach/Bowel: Stomach is nondistended. No gastric wall thickening. No evidence of outlet obstruction. Large duodenum diverticulum noted. No small bowel wall thickening. No small bowel dilatation. The terminal ileum is normal. The appendix is not visualized, but there is no edema or inflammation in the region of the cecum. Diverticular changes are noted in the left colon without evidence of diverticulitis. Prominent circumferential wall thickening is identified in the rectum (see image 95 series 2). Vascular/Lymphatic: There is abdominal aortic atherosclerosis without aneurysm. There is no gastrohepatic or hepatoduodenal ligament lymphadenopathy. No intraperitoneal or retroperitoneal lymphadenopy. No pelvic sidewall lymphadenopathy. Reproductive: Prostate gland is enlarged. Other: No intraperitoneal free fluid. Musculoskeletal: Bone windows reveal no worrisome lytic or sclerotic osseous lesions. IMPRESSION: 1. Circumferential wall thickening and edema in the rectum may be related to an infectious or inflammatory proctitis. No substantial perirectal edema or inflammation. 2. Known right middle lobe lesion with interval increase and adjacent airspace disease. Progressive neoplasm is not excluded. 3. Status post splenectomy with multiple splenules evident. 4. 7 mm nonobstructing stone lower pole left kidney with stable 11 mm interpolar right renal cyst. 5. Prostatomegaly. 6. Abdominal aortic atherosclerosis. Electronically Signed   By: EMisty StanleyM.D.   On: 08/14/2015 14:06    Labs:  CBC:  Recent Labs  08/11/15 0927 08/14/15 1151 08/18/15 1013 08/30/15 1150  WBC 15.2* 15.3* 5.1 10.3  HGB 10.9* 11.5* 11.0* 11.0*  HCT 33.4* 33.5* 32.8* 31.9*  PLT 458*  458* 334 465*    COAGS:  Recent Labs  08/30/15 1150  INR 1.11    BMP:  Recent Labs  10/05/14 0424 10/06/14 0813 10/07/14 0535  07/30/15 0815 08/11/15 0927 08/14/15 1151 08/18/15 1013  NA 125* 126* 130*  < > 135* 132* 132* 131*  K 3.7 3.5 4.2  < > 4.2 4.3 4.7 4.3  CL 93* 93* 97  --   --   --  99*  --   CO2 26 26 28   < > 27 21* 29 26  GLUCOSE 108* 123* 133*  < > 100 204* 104* 94  BUN 11 6 6   < > 12.8 16.0 13 10.0  CALCIUM 8.0* 8.3* 8.2*  < > 10.1 9.5 9.7 9.6  CREATININE 0.53 0.37* 0.52  < > 0.7 0.8 0.51* 0.7  GFRNONAA >90 >90 >90  --   --   --  >60  --   GFRAA >90 >90 >90  --   --   --  >60  --   < > = values in this interval not displayed.  LIVER FUNCTION TESTS:  Recent Labs  07/30/15 0815 08/11/15 0927 08/14/15 1151 08/18/15 1013  BILITOT 0.53 0.33 1.1 0.50  AST 20 19 25  59*  ALT 14 17 28  56*  ALKPHOS 68 68 54 57  PROT 6.8 7.0 6.3* 6.6  ALBUMIN 3.6 3.5 3.5 3.5    TUMOR MARKERS: No results for input(s): AFPTM, CEA, CA199, CHROMGRNA in the last 8760 hours.  Assessment and Plan: 82 y.o. male with history of stage IIIB (T1a, N3, M0) non-small cell lung cancer, adenocarcinoma with negative EGFR mutation, negative ALKgene translocation, negative ROS 1, but positive RET diagnosed in November 2016 who presented with right middle lobe lung nodule in addition to mediastinal and left supraclavicular lymphadenopathy.He presents today for Port-A-Cath placement for chemotherapy.Risks and benefits discussed with the patient/wife including, but not limited to bleeding, infection, pneumothorax, or fibrin sheath development and need for additional procedures.All of the patient's questions were answered, patient is agreeable to proceed.Consent signed and in chart.     Thank you for this interesting consult.  I greatly enjoyed meeting Sean Cooke and look forward to participating in their care.  A copy of this report was sent to the requesting provider on this  date.  Electronically Signed: D. Rowe Robert 08/30/2015, 12:37 PM   I spent a total of 15 minutes in face to face in clinical consultation, greater than 50% of which was counseling/coordinating care for Port-A-Cath placement

## 2015-08-30 NOTE — Procedures (Signed)
Interventional Radiology Procedure Note  Procedure: Placement of a right IJ approach single lumen PowerPort.  Tip is positioned at the superior cavoatrial junction and catheter is ready for immediate use.  Complications: No immediate Recommendations:  - Ok to shower tomorrow - Do not submerge for 7 days - Routine line care   Melena Hayes T. Eloyce Bultman, M.D Pager:  [phone]   

## 2015-09-01 ENCOUNTER — Encounter: Payer: Self-pay | Admitting: Internal Medicine

## 2015-09-01 ENCOUNTER — Telehealth: Payer: Self-pay | Admitting: Internal Medicine

## 2015-09-01 ENCOUNTER — Ambulatory Visit (HOSPITAL_BASED_OUTPATIENT_CLINIC_OR_DEPARTMENT_OTHER): Payer: PPO | Admitting: Internal Medicine

## 2015-09-01 ENCOUNTER — Other Ambulatory Visit (HOSPITAL_BASED_OUTPATIENT_CLINIC_OR_DEPARTMENT_OTHER): Payer: PPO

## 2015-09-01 ENCOUNTER — Ambulatory Visit (HOSPITAL_BASED_OUTPATIENT_CLINIC_OR_DEPARTMENT_OTHER): Payer: PPO

## 2015-09-01 VITALS — BP 149/76 | HR 76 | Temp 98.1°F | Resp 18 | Ht 68.0 in | Wt 169.4 lb

## 2015-09-01 DIAGNOSIS — Z5111 Encounter for antineoplastic chemotherapy: Secondary | ICD-10-CM

## 2015-09-01 DIAGNOSIS — C3411 Malignant neoplasm of upper lobe, right bronchus or lung: Secondary | ICD-10-CM

## 2015-09-01 DIAGNOSIS — K1230 Oral mucositis (ulcerative), unspecified: Secondary | ICD-10-CM

## 2015-09-01 DIAGNOSIS — K5900 Constipation, unspecified: Secondary | ICD-10-CM

## 2015-09-01 DIAGNOSIS — C342 Malignant neoplasm of middle lobe, bronchus or lung: Secondary | ICD-10-CM | POA: Diagnosis not present

## 2015-09-01 LAB — CBC WITH DIFFERENTIAL/PLATELET
BASO%: 0.1 % (ref 0.0–2.0)
Basophils Absolute: 0 10*3/uL (ref 0.0–0.1)
EOS%: 0 % (ref 0.0–7.0)
Eosinophils Absolute: 0 10*3/uL (ref 0.0–0.5)
HCT: 30.1 % — ABNORMAL LOW (ref 38.4–49.9)
HGB: 10.2 g/dL — ABNORMAL LOW (ref 13.0–17.1)
LYMPH#: 1.6 10*3/uL (ref 0.9–3.3)
LYMPH%: 13 % — ABNORMAL LOW (ref 14.0–49.0)
MCH: 30.7 pg (ref 27.2–33.4)
MCHC: 33.9 g/dL (ref 32.0–36.0)
MCV: 90.7 fL (ref 79.3–98.0)
MONO#: 1.3 10*3/uL — ABNORMAL HIGH (ref 0.1–0.9)
MONO%: 10.2 % (ref 0.0–14.0)
NEUT#: 9.5 10*3/uL — ABNORMAL HIGH (ref 1.5–6.5)
NEUT%: 76.7 % — AB (ref 39.0–75.0)
Platelets: 410 10*3/uL — ABNORMAL HIGH (ref 140–400)
RBC: 3.32 10*6/uL — ABNORMAL LOW (ref 4.20–5.82)
RDW: 14.1 % (ref 11.0–14.6)
WBC: 12.3 10*3/uL — ABNORMAL HIGH (ref 4.0–10.3)

## 2015-09-01 LAB — COMPREHENSIVE METABOLIC PANEL
ALT: 35 U/L (ref 0–55)
AST: 26 U/L (ref 5–34)
Albumin: 3.2 g/dL — ABNORMAL LOW (ref 3.5–5.0)
Alkaline Phosphatase: 101 U/L (ref 40–150)
Anion Gap: 7 mEq/L (ref 3–11)
BUN: 10.9 mg/dL (ref 7.0–26.0)
CHLORIDE: 98 meq/L (ref 98–109)
CO2: 24 meq/L (ref 22–29)
CREATININE: 0.7 mg/dL (ref 0.7–1.3)
Calcium: 9.7 mg/dL (ref 8.4–10.4)
EGFR: 86 mL/min/{1.73_m2} — ABNORMAL LOW (ref >90)
GLUCOSE: 180 mg/dL — AB (ref 70–140)
POTASSIUM: 4.4 meq/L (ref 3.5–5.1)
SODIUM: 128 meq/L — AB (ref 136–145)
Total Bilirubin: 0.31 mg/dL (ref 0.20–1.20)
Total Protein: 6.3 g/dL — ABNORMAL LOW (ref 6.4–8.3)

## 2015-09-01 LAB — TECHNOLOGIST REVIEW

## 2015-09-01 MED ORDER — HEPARIN SOD (PORK) LOCK FLUSH 100 UNIT/ML IV SOLN
500.0000 [IU] | Freq: Once | INTRAVENOUS | Status: AC | PRN
  Administered 2015-09-01: 500 [IU]
  Filled 2015-09-01: qty 5

## 2015-09-01 MED ORDER — SODIUM CHLORIDE 0.9 % IJ SOLN
10.0000 mL | INTRAMUSCULAR | Status: DC | PRN
  Administered 2015-09-01: 10 mL
  Filled 2015-09-01: qty 10

## 2015-09-01 MED ORDER — LIDOCAINE-PRILOCAINE 2.5-2.5 % EX CREA: 1.0000 "application " | TOPICAL_CREAM | CUTANEOUS | Status: DC | PRN

## 2015-09-01 MED ORDER — SODIUM CHLORIDE 0.9 % IV SOLN
Freq: Once | INTRAVENOUS | Status: AC
  Administered 2015-09-01: 12:00:00 via INTRAVENOUS

## 2015-09-01 MED ORDER — SODIUM CHLORIDE 0.9 % IV SOLN
412.5000 mg | Freq: Once | INTRAVENOUS | Status: AC
  Administered 2015-09-01: 410 mg via INTRAVENOUS
  Filled 2015-09-01: qty 41

## 2015-09-01 MED ORDER — SODIUM CHLORIDE 0.9 % IV SOLN
500.0000 mg/m2 | Freq: Once | INTRAVENOUS | Status: AC
  Administered 2015-09-01: 950 mg via INTRAVENOUS
  Filled 2015-09-01: qty 38

## 2015-09-01 MED ORDER — SODIUM CHLORIDE 0.9 % IV SOLN
Freq: Once | INTRAVENOUS | Status: AC
  Administered 2015-09-01: 12:00:00 via INTRAVENOUS
  Filled 2015-09-01: qty 8

## 2015-09-01 NOTE — Progress Notes (Signed)
[place] [phone]   [phone]  OFFICE PROGRESS NOTE  [name], MD [address] [place] [address]  DIAGNOSIS: stage IIIB (T1a, N3, M0) non-small cell lung cancer, adenocarcinoma with negative EGFR mutation, negative ALKgene translocation, negative ROS 1, but positive RET diagnosed in November 2016 and presented with right middle lobe lung nodule in addition to mediastinal and left supraclavicular lymphadenopathy.  PRIOR THERAPY: None  CURRENT THERAPY: Systemic chemotherapy with carboplatin for AUC of 5 and Alimta 500 MG/M2 every 3 weeks, first dose 08/11/2015. Status post one cycle.  INTERVAL HISTORY: Sean Cooke 82 y.o. male returns to the clinic today for follow-up visit accompanied by his wife. The patient is currently undergoing systemic chemotherapy with carboplatin and Alimta status post 1 cycle given last week. He tolerated the first cycle of his treatment well. He was seen recently at the emergency Department complaining of constipation which completely resolved at this point after treatment with sorbitol and Senokot. He was noted to have Hemoccult-positive stool and he is scheduled for colonoscopy by Dr. Fuller Plan in 2 weeks. He denied having any significant fever or chills. He has no nausea or vomiting. He also had a Port-A-Cath placed for IV access. He is here today to start cycle #2 of his chemotherapy.   MEDICAL HISTORY: Past Medical History  Diagnosis Date  . GERD (gastroesophageal reflux disease)   . Hiatal hernia   . Degenerative joint disease   . Rib fracture   . Diverticulosis   . Adenomatous colon polyp 05/2003  . Duodenal diverticulum   . Neuritis/radiculitis due to displacement of lumbar intervertebral disc   . Arthritis   . Pneumonia   . Hemorrhoids   . Encounter for antineoplastic chemotherapy 07/31/2015  . Lung cancer ([facility])     ALLERGIES:  is allergic to benzonatate; lyrica;  penicillins; pneumococcal vaccine polyvalent; tizanidine; and tramadol.  MEDICATIONS:  Current Outpatient Prescriptions  Medication Sig Dispense Refill  . acetaminophen (TYLENOL) 650 MG CR tablet Take 650 mg by mouth every 6 (six) hours as needed for pain or fever. Reported on 08/18/2015    . alum & mag hydroxide-simeth (MAALOX PLUS) 400-400-40 MG/5ML suspension Take 5 mLs by mouth every 6 (six) hours as needed for indigestion. Reported on 08/20/2015    . aspirin 81 MG tablet Take 81 mg by mouth at bedtime.     . Calcium Carbonate-Vitamin D (CALCIUM + D PO) Take 1 capsule by mouth 2 (two) times daily.     . cromolyn (OPTICROM) 4 % ophthalmic solution Administer 1 drop to both eyes Four (4) times a day.    . denosumab (PROLIA) 60 MG/ML SOLN injection Inject 60 mg into the skin every 6 (six) months. Administer in upper arm, thigh, or abdomen    . dexamethasone (DECADRON) 4 MG tablet 4 mg by mouth twice a day the day before, day of and day after the chemotherapy every 3 weeks 40 tablet 1  . folic acid (FOLVITE) 1 MG tablet Take 1 tablet (1 mg total) by mouth daily. 30 tablet 4  . lactobacillus acidophilus & bulgar (LACTINEX) chewable tablet Chew 1 tablet by mouth 2 (two) times daily. Store in [place], [facility]    . loperamide (IMODIUM) 2 MG capsule Take 2 mg by mouth as needed for diarrhea or loose stools.    Marland Kitchen loratadine (CLARITIN) 10 MG tablet Take 10 mg by mouth.    . losartan (COZAAR) 50 MG tablet  Take 50 mg by mouth daily.    . meloxicam (MOBIC) 7.5 MG tablet Take 7.5 mg by mouth daily as needed for pain. Reported on 08/18/2015    . Multiple Vitamin (MULTIVITAMIN) tablet Take 1 tablet by mouth daily.    . Omega-3 Fatty Acids (FISH OIL) 1000 MG CAPS Take 1,000 mg by mouth daily.     Marland Kitchen oxybutynin (DITROPAN) 5 MG tablet Take 5 mg by mouth daily.    . pantoprazole (PROTONIX) 40 MG tablet Take 40 mg by mouth daily.    . Probiotic Product (ALIGN) 4 MG CAPS Take 1 capsule by mouth daily.    . saw palmetto 160  MG capsule Take 160 mg by mouth daily.    . sorbitol 70 % SOLN Take 30 mLs by mouth daily as needed for moderate constipation. 473 mL 0  . Tamsulosin HCl (FLOMAX) 0.4 MG CAPS Take 0.4 mg by mouth daily.    Marland Kitchen dextromethorphan-guaiFENesin (ROBITUSSIN-DM) 10-100 MG/5ML liquid Take 10 mLs by mouth every 4 (four) hours as needed for cough. Reported on 09/01/2015    . hydrocortisone (PROCTOSOL HC) 2.5 % rectal cream Place 1 application rectally 2 (two) times daily as needed for hemorrhoids. Reported on 09/01/2015    . prochlorperazine (COMPAZINE) 10 MG tablet Take 1 tablet (10 mg total) by mouth every 6 (six) hours as needed for nausea or vomiting. (Patient not taking: Reported on 09/01/2015) 30 tablet 0   No current facility-administered medications for this visit.    SURGICAL HISTORY:  Past Surgical History  Procedure Laterality Date  . Inguinal hernia repair    . Transurethral resection of prostate    . Splenectomy  2002  . Cataract extraction      bilateral  . Cholecystectomy  06/2003    REVIEW OF SYSTEMS:  A comprehensive review of systems was negative except for: Constitutional: positive for fatigue   PHYSICAL EXAMINATION: General appearance: alert, cooperative, fatigued and no distress Head: Normocephalic, without obvious abnormality, atraumatic Neck: no adenopathy, no JVD, supple, symmetrical, trachea midline and thyroid not enlarged, symmetric, no tenderness/mass/nodules Lymph nodes: Cervical, supraclavicular, and axillary nodes normal. Resp: clear to auscultation bilaterally Back: symmetric, no curvature. ROM normal. No CVA tenderness. Cardio: regular rate and rhythm, S1, S2 normal, no murmur, click, rub or gallop GI: soft, non-tender; bowel sounds normal; no masses,  no organomegaly Extremities: extremities normal, atraumatic, no cyanosis or edema Neurologic: Alert and oriented X 3, normal strength and tone. Normal symmetric reflexes. Normal coordination and gait  ECOG PERFORMANCE  STATUS: 1 - Symptomatic but completely ambulatory  Blood pressure 149/76, pulse 76, temperature 98.1 F (36.7 C), temperature source Oral, resp. rate 18, height 5\' 8"  (1.727 m), weight 169 lb 6.4 oz (76.839 kg), SpO2 98 %.  LABORATORY DATA: Lab Results  Component Value Date   WBC 12.3* 09/01/2015   HGB 10.2* 09/01/2015   HCT 30.1* 09/01/2015   MCV 90.7 09/01/2015   PLT 410* 09/01/2015      Chemistry      Component Value Date/Time   NA 133* 08/30/2015 1150   NA 131* 08/18/2015 1013   K 4.1 08/30/2015 1150   K 4.3 08/18/2015 1013   CL 100* 08/30/2015 1150   CO2 24 08/30/2015 1150   CO2 26 08/18/2015 1013   BUN 8 08/30/2015 1150   BUN 10.0 08/18/2015 1013   CREATININE 0.55* 08/30/2015 1150   CREATININE 0.7 08/18/2015 1013      Component Value Date/Time   CALCIUM 9.8 08/30/2015 1150  CALCIUM 9.6 08/18/2015 1013   ALKPHOS 62 08/30/2015 1150   ALKPHOS 57 08/18/2015 1013   AST 28 08/30/2015 1150   AST 59* 08/18/2015 1013   ALT 25 08/30/2015 1150   ALT 56* 08/18/2015 1013   BILITOT 0.8 08/30/2015 1150   BILITOT 0.50 08/18/2015 1013       RADIOGRAPHIC STUDIES: Ct Abdomen Pelvis W Contrast  08/14/2015  CLINICAL DATA:  Initial encounter for lower abdominal pain with constipation for 3 days. Personal history of lung cancer. EXAM: CT ABDOMEN AND PELVIS WITH CONTRAST TECHNIQUE: Multidetector CT imaging of the abdomen and pelvis was performed using the standard protocol following bolus administration of intravenous contrast. CONTRAST:  192m OMNIPAQUE IOHEXOL 300 MG/ML  SOLN COMPARISON:  03/05/2015. FINDINGS: Lower chest: 16 x 20 mm right middle lobe nodule has slightly more associated airspace disease than on a chest CT from 05/27/2015. Tiny pericardial effusion not substantially changed since 03/05/2015. Hepatobiliary: 10 mm low-density lesion in the dome of the liver is stable. Gallbladder is surgically absent. No intrahepatic or extrahepatic biliary dilation. Pancreas: Pancreas  is diffusely atrophic without dilatation of the main duct. Spleen: Spleen is surgically absent with multiple splenules evident. Adrenals/Urinary Tract: No adrenal nodule or mass. Stable 11 mm interpolar right renal cyst. 7 mm nonobstructing stone identified lower pole left kidney. No evidence for hydroureter. Urinary bladder is distended. Stomach/Bowel: Stomach is nondistended. No gastric wall thickening. No evidence of outlet obstruction. Large duodenum diverticulum noted. No small bowel wall thickening. No small bowel dilatation. The terminal ileum is normal. The appendix is not visualized, but there is no edema or inflammation in the region of the cecum. Diverticular changes are noted in the left colon without evidence of diverticulitis. Prominent circumferential wall thickening is identified in the rectum (see image 95 series 2). Vascular/Lymphatic: There is abdominal aortic atherosclerosis without aneurysm. There is no gastrohepatic or hepatoduodenal ligament lymphadenopathy. No intraperitoneal or retroperitoneal lymphadenopy. No pelvic sidewall lymphadenopathy. Reproductive: Prostate gland is enlarged. Other: No intraperitoneal free fluid. Musculoskeletal: Bone windows reveal no worrisome lytic or sclerotic osseous lesions. IMPRESSION: 1. Circumferential wall thickening and edema in the rectum may be related to an infectious or inflammatory proctitis. No substantial perirectal edema or inflammation. 2. Known right middle lobe lesion with interval increase and adjacent airspace disease. Progressive neoplasm is not excluded. 3. Status post splenectomy with multiple splenules evident. 4. 7 mm nonobstructing stone lower pole left kidney with stable 11 mm interpolar right renal cyst. 5. Prostatomegaly. 6. Abdominal aortic atherosclerosis. Electronically Signed   By: EMisty StanleyM.D.   On: 08/14/2015 14:06   Ir Fluoro Guide Cv Line Right  08/30/2015  CLINICAL DATA:  Metastatic lung carcinoma and need for porta  cath for continued chemotherapy administration. EXAM: IMPLANTED PORT A CATH PLACEMENT WITH ULTRASOUND AND FLUOROSCOPIC GUIDANCE ANESTHESIA/SEDATION: 1.0 Mg IV Versed; 25 mcg IV Fentanyl Total Moderate Sedation Time: 30 minutes. The patient was monitored continuously during the procedure by Radiology nursing. Additional Medications: 900 mg IV clindamycin. IV antibiotic was given in a appropriate time interval prior to skin puncture. FLUOROSCOPY TIME:  24 seconds. PROCEDURE: The procedure, risks, benefits, and alternatives were explained to the patient. Questions regarding the procedure were encouraged and answered. The patient understands and consents to the procedure. The right neck and chest were prepped with chlorhexidine in a sterile fashion, and a sterile drape was applied covering the operative field. Maximum barrier sterile technique with sterile gowns and gloves were used for the procedure. Local anesthesia was provided  with 1% lidocaine. After creating a small venotomy incision, a 21 gauge needle was advanced into the right internal jugular vein under direct, real-time ultrasound guidance. Ultrasound image documentation was performed. After securing guidewire access, an 8 Fr dilator was placed. A J-wire was kinked to measure appropriate catheter length. A subcutaneous port pocket was then created along the upper chest wall utilizing sharp and blunt dissection. Portable cautery was utilized. The pocket was irrigated with sterile saline. A single lumen power injectable port was chosen for placement. The 8 Fr catheter was tunneled from the port pocket site to the venotomy incision. The port was placed in the pocket. External catheter was trimmed to appropriate length based on guidewire measurement. At the venotomy, an 8 Fr peel-away sheath was placed over a guidewire. The catheter was then placed through the sheath and the sheath removed. Final catheter positioning was confirmed and documented with a  fluoroscopic spot image. The port was accessed with a needle and aspirated and flushed with heparinized saline. The needle was removed. The venotomy and port pocket incisions were closed with subcutaneous 3-0 Monocryl and subcuticular 4-0 Vicryl. Dermabond was applied to both incisions. COMPLICATIONS: None FINDINGS: After catheter placement, the tip lies at the cavoatrial junction. The catheter aspirates normally and is ready for immediate use. IMPRESSION: Placement of single lumen port a cath via right internal jugular vein. The catheter tip lies at the cavoatrial junction. A power injectable port a cath was placed and is ready for immediate use. Electronically Signed   By: Aletta Edouard M.D.   On: 08/30/2015 16:07   Ir US Guide Vasc Access Right  08/30/2015  CLINICAL DATA:  Metastatic lung carcinoma and need for porta cath for continued chemotherapy administration. EXAM: IMPLANTED PORT A CATH PLACEMENT WITH ULTRASOUND AND FLUOROSCOPIC GUIDANCE ANESTHESIA/SEDATION: 1.0 Mg IV Versed; 25 mcg IV Fentanyl Total Moderate Sedation Time: 30 minutes. The patient was monitored continuously during the procedure by Radiology nursing. Additional Medications: 900 mg IV clindamycin. IV antibiotic was given in a appropriate time interval prior to skin puncture. FLUOROSCOPY TIME:  24 seconds. PROCEDURE: The procedure, risks, benefits, and alternatives were explained to the patient. Questions regarding the procedure were encouraged and answered. The patient understands and consents to the procedure. The right neck and chest were prepped with chlorhexidine in a sterile fashion, and a sterile drape was applied covering the operative field. Maximum barrier sterile technique with sterile gowns and gloves were used for the procedure. Local anesthesia was provided with 1% lidocaine. After creating a small venotomy incision, a 21 gauge needle was advanced into the right internal jugular vein under direct, real-time ultrasound  guidance. Ultrasound image documentation was performed. After securing guidewire access, an 8 Fr dilator was placed. A J-wire was kinked to measure appropriate catheter length. A subcutaneous port pocket was then created along the upper chest wall utilizing sharp and blunt dissection. Portable cautery was utilized. The pocket was irrigated with sterile saline. A single lumen power injectable port was chosen for placement. The 8 Fr catheter was tunneled from the port pocket site to the venotomy incision. The port was placed in the pocket. External catheter was trimmed to appropriate length based on guidewire measurement. At the venotomy, an 8 Fr peel-away sheath was placed over a guidewire. The catheter was then placed through the sheath and the sheath removed. Final catheter positioning was confirmed and documented with a fluoroscopic spot image. The port was accessed with a needle and aspirated and flushed with heparinized saline.  The needle was removed. The venotomy and port pocket incisions were closed with subcutaneous 3-0 Monocryl and subcuticular 4-0 Vicryl. Dermabond was applied to both incisions. COMPLICATIONS: None FINDINGS: After catheter placement, the tip lies at the cavoatrial junction. The catheter aspirates normally and is ready for immediate use. IMPRESSION: Placement of single lumen port a cath via right internal jugular vein. The catheter tip lies at the cavoatrial junction. A power injectable port a cath was placed and is ready for immediate use. Electronically Signed   By: Aletta Edouard M.D.   On: 08/30/2015 16:07    ASSESSMENT AND PLAN: This is a very pleasant 82 years old white male with a stage IIIB non-small cell lung cancer with negative EGFR mutation, negative ALK gene translocation and PDL 1 expression 10%. The patient is not eligible for concurrent chemoradiation because of the large radiotherapy field. The patient is currently undergoing systemic chemotherapy with carboplatin and  Alimta status post 1 cycle. He tolerated the first cycle of his treatment fairly well. We will proceed with cycle #2 today as scheduled. For constipation, he will continue on sorbitol and Senokot as needed. For the mouth sores, the patient will continue on Magic myelosuppression and addition to Biotene and saltwater. The patient would come back for follow-up visit in 3 weeks for reevaluation before starting cycle #3. He was advised to call immediately if he has any concerning symptoms in the interval. The patient voices understanding of current disease status and treatment options and is in agreement with the current care plan.  All questions were answered. The patient knows to call the clinic with any problems, questions or concerns. We can certainly see the patient much sooner if necessary.  Disclaimer: This note was dictated with voice recognition software. Similar sounding words can inadvertently be transcribed and may not be corrected upon review.

## 2015-09-01 NOTE — Telephone Encounter (Signed)
per pof to sch pt appt-sent MW/Meiliisa email to sch trmt-pt to get update copy of avs on 1/25 appt

## 2015-09-01 NOTE — Patient Instructions (Signed)
[facility] Discharge Instructions for Patients Receiving Chemotherapy  Today you received the following chemotherapy agents: Alimta/Carboplatin  To help prevent nausea and vomiting after your treatment, we encourage you to take your nausea medication as directed.   If you develop nausea and vomiting that is not controlled by your nausea medication, call the clinic.   BELOW ARE SYMPTOMS THAT SHOULD BE REPORTED IMMEDIATELY:  *FEVER GREATER THAN 100.5 F  *CHILLS WITH OR WITHOUT FEVER  NAUSEA AND VOMITING THAT IS NOT CONTROLLED WITH YOUR NAUSEA MEDICATION  *UNUSUAL SHORTNESS OF BREATH  *UNUSUAL BRUISING OR BLEEDING  TENDERNESS IN MOUTH AND THROAT WITH OR WITHOUT PRESENCE OF ULCERS  *URINARY PROBLEMS  *BOWEL PROBLEMS  UNUSUAL RASH Items with * indicate a potential emergency and should be followed up as soon as possible.  Feel free to call the clinic you have any questions or concerns. The clinic phone number is [phone].  Please show the CHEMO ALERT CARD at check-in to the Emergency Department and triage nurse.   

## 2015-09-02 ENCOUNTER — Other Ambulatory Visit: Payer: Self-pay | Admitting: Internal Medicine

## 2015-09-08 ENCOUNTER — Other Ambulatory Visit: Payer: Self-pay | Admitting: Internal Medicine

## 2015-09-08 ENCOUNTER — Telehealth: Payer: Self-pay | Admitting: Internal Medicine

## 2015-09-08 ENCOUNTER — Other Ambulatory Visit (HOSPITAL_BASED_OUTPATIENT_CLINIC_OR_DEPARTMENT_OTHER): Payer: PPO

## 2015-09-08 DIAGNOSIS — C342 Malignant neoplasm of middle lobe, bronchus or lung: Secondary | ICD-10-CM | POA: Diagnosis not present

## 2015-09-08 DIAGNOSIS — C3411 Malignant neoplasm of upper lobe, right bronchus or lung: Secondary | ICD-10-CM

## 2015-09-08 LAB — CBC WITH DIFFERENTIAL/PLATELET
BASO%: 0.6 % (ref 0.0–2.0)
BASOS ABS: 0 10*3/uL (ref 0.0–0.1)
EOS%: 4.3 % (ref 0.0–7.0)
Eosinophils Absolute: 0.1 10*3/uL (ref 0.0–0.5)
HCT: 31.3 % — ABNORMAL LOW (ref 38.4–49.9)
HGB: 10.3 g/dL — ABNORMAL LOW (ref 13.0–17.1)
LYMPH%: 18.5 % (ref 14.0–49.0)
MCH: 30.9 pg (ref 27.2–33.4)
MCHC: 33.1 g/dL (ref 32.0–36.0)
MCV: 93.5 fL (ref 79.3–98.0)
MONO#: 0.7 10*3/uL (ref 0.1–0.9)
MONO%: 19.9 % — AB (ref 0.0–14.0)
NEUT#: 1.9 10*3/uL (ref 1.5–6.5)
NEUT%: 56.7 % (ref 39.0–75.0)
Platelets: 279 10*3/uL (ref 140–400)
RBC: 3.35 10*6/uL — AB (ref 4.20–5.82)
RDW: 13.5 % (ref 11.0–14.6)
WBC: 3.4 10*3/uL — ABNORMAL LOW (ref 4.0–10.3)
lymph#: 0.6 10*3/uL — ABNORMAL LOW (ref 0.9–3.3)

## 2015-09-08 LAB — COMPREHENSIVE METABOLIC PANEL
ALT: 35 U/L (ref 0–55)
AST: 34 U/L (ref 5–34)
Albumin: 3.3 g/dL — ABNORMAL LOW (ref 3.5–5.0)
Alkaline Phosphatase: 63 U/L (ref 40–150)
Anion Gap: 5 mEq/L (ref 3–11)
BUN: 10.4 mg/dL (ref 7.0–26.0)
CALCIUM: 9.7 mg/dL (ref 8.4–10.4)
CHLORIDE: 100 meq/L (ref 98–109)
CO2: 28 meq/L (ref 22–29)
CREATININE: 0.7 mg/dL (ref 0.7–1.3)
EGFR: 86 mL/min/{1.73_m2} — ABNORMAL LOW (ref >90)
Glucose: 105 mg/dl (ref 70–140)
POTASSIUM: 4 meq/L (ref 3.5–5.1)
Sodium: 132 mEq/L — ABNORMAL LOW (ref 136–145)
Total Bilirubin: 0.71 mg/dL (ref 0.20–1.20)
Total Protein: 6.2 g/dL — ABNORMAL LOW (ref 6.4–8.3)

## 2015-09-08 NOTE — Telephone Encounter (Signed)
Pt came in to get schedule. Gv pt already scheduled appts for Jan - March.

## 2015-09-14 ENCOUNTER — Telehealth: Payer: Self-pay

## 2015-09-14 NOTE — Telephone Encounter (Signed)
Pt called stating he has a flex sigmoidoscopy scheduled with Dr Fuller Plan for February 6th. He was asking if this was OK.  Pt also called on 1/6 with same question stating Dr Fuller Plan wanted him to call to make sure it was OK.

## 2015-09-15 ENCOUNTER — Telehealth: Payer: Self-pay | Admitting: Medical Oncology

## 2015-09-15 ENCOUNTER — Other Ambulatory Visit (HOSPITAL_BASED_OUTPATIENT_CLINIC_OR_DEPARTMENT_OTHER): Payer: PPO

## 2015-09-15 DIAGNOSIS — C342 Malignant neoplasm of middle lobe, bronchus or lung: Secondary | ICD-10-CM | POA: Diagnosis not present

## 2015-09-15 DIAGNOSIS — C3411 Malignant neoplasm of upper lobe, right bronchus or lung: Secondary | ICD-10-CM

## 2015-09-15 LAB — COMPREHENSIVE METABOLIC PANEL
ALT: 27 U/L (ref 0–55)
AST: 26 U/L (ref 5–34)
Albumin: 3.3 g/dL — ABNORMAL LOW (ref 3.5–5.0)
Alkaline Phosphatase: 71 U/L (ref 40–150)
Anion Gap: 7 mEq/L (ref 3–11)
BILIRUBIN TOTAL: 0.4 mg/dL (ref 0.20–1.20)
BUN: 8.7 mg/dL (ref 7.0–26.0)
CO2: 26 meq/L (ref 22–29)
CREATININE: 0.7 mg/dL (ref 0.7–1.3)
Calcium: 9.9 mg/dL (ref 8.4–10.4)
Chloride: 99 mEq/L (ref 98–109)
EGFR: 87 mL/min/{1.73_m2} — AB (ref >90)
GLUCOSE: 114 mg/dL (ref 70–140)
Potassium: 4.2 mEq/L (ref 3.5–5.1)
SODIUM: 132 meq/L — AB (ref 136–145)
TOTAL PROTEIN: 6.4 g/dL (ref 6.4–8.3)

## 2015-09-15 LAB — CBC WITH DIFFERENTIAL/PLATELET
BASO%: 0.2 % (ref 0.0–2.0)
Basophils Absolute: 0 10*3/uL (ref 0.0–0.1)
EOS%: 0.9 % (ref 0.0–7.0)
Eosinophils Absolute: 0.1 10*3/uL (ref 0.0–0.5)
HCT: 31.4 % — ABNORMAL LOW (ref 38.4–49.9)
HGB: 10.5 g/dL — ABNORMAL LOW (ref 13.0–17.1)
LYMPH%: 13.7 % — AB (ref 14.0–49.0)
MCH: 31.4 pg (ref 27.2–33.4)
MCHC: 33.4 g/dL (ref 32.0–36.0)
MCV: 94.1 fL (ref 79.3–98.0)
MONO#: 1.5 10*3/uL — ABNORMAL HIGH (ref 0.1–0.9)
MONO%: 23.9 % — AB (ref 0.0–14.0)
NEUT%: 61.3 % (ref 39.0–75.0)
NEUTROS ABS: 3.9 10*3/uL (ref 1.5–6.5)
Platelets: 262 10*3/uL (ref 140–400)
RBC: 3.34 10*6/uL — AB (ref 4.20–5.82)
RDW: 14.1 % (ref 11.0–14.6)
WBC: 6.3 10*3/uL (ref 4.0–10.3)
lymph#: 0.9 10*3/uL (ref 0.9–3.3)

## 2015-09-15 NOTE — Telephone Encounter (Signed)
I told pt it is okay to keep appt with Dr Fuller Plan for procedure and I gave pt his cbc to take to Dr Fuller Plan - I also faxed his albs to Dr Fuller Plan.

## 2015-09-20 ENCOUNTER — Ambulatory Visit (AMBULATORY_SURGERY_CENTER): Payer: PPO | Admitting: Gastroenterology

## 2015-09-20 ENCOUNTER — Encounter: Payer: Self-pay | Admitting: Gastroenterology

## 2015-09-20 VITALS — BP 119/76 | HR 61 | Temp 98.0°F | Resp 17 | Ht 68.0 in | Wt 168.0 lb

## 2015-09-20 DIAGNOSIS — R933 Abnormal findings on diagnostic imaging of other parts of digestive tract: Secondary | ICD-10-CM

## 2015-09-20 DIAGNOSIS — K5900 Constipation, unspecified: Secondary | ICD-10-CM | POA: Diagnosis not present

## 2015-09-20 DIAGNOSIS — I10 Essential (primary) hypertension: Secondary | ICD-10-CM | POA: Diagnosis not present

## 2015-09-20 MED ORDER — SODIUM CHLORIDE 0.9 % IV SOLN: 500.0000 mL | INTRAVENOUS | Status: DC

## 2015-09-20 NOTE — Op Note (Signed)
[place]  [address] [place], [address]   FLEXIBLE SIGMOIDOSCOPY PROCEDURE REPORT  PATIENT: Sean Cooke, Sean Cooke  MR#: [id number] BIRTHDATE: April 20, 1927 , 34  yrs. old GENDER: male ENDOSCOPIST: Ladene Artist, MD, [facility] PROCEDURE DATE:  09/20/2015 PROCEDURE:   Sigmoidoscopy, diagnostic ASA CLASS:   Class III INDICATIONS:an abnormal CT.   constipation. MEDICATIONS: Monitored anesthesia care and Propofol 70 mg IV DESCRIPTION OF PROCEDURE:   After the risks benefits and alternatives of the procedure were thoroughly explained, informed consent was obtained.  Digital exam revealed no abnormalities of the rectum. The LB [id number]  endoscope was introduced through the anus  and advanced to the descending colon , The exam was Without limitations.    The quality of the prep was The overall prep quality was excellent. . Estimated blood loss is zero unless otherwise noted in this procedure report. The instrument was then slowly withdrawn as the mucosa was fully examined.    COLON FINDINGS: There was moderate diverticulosis noted in the descending colon and sigmoid colon. The colon mucosa was otherwise normal including the rectum.   Retroflexed views revealed internal Grade I hemorrhoids.  The scope was then withdrawn from the patient and the procedure terminated.  COMPLICATIONS: There were no immediate complications.  ENDOSCOPIC IMPRESSION: 1.   Moderate diverticulosis in the descending colon and sigmoid colon 2.   Grade I internal hemorrhoids 3.   The flex sigmoidoscopy was otherwise normal  RECOMMENDATIONS: 1.  Continue current laxative regimen 2.  Follow-up: primary MD as planned and Oncology as planned   eSigned:  Ladene Artist, MD, [facility] 09/20/2015 3:14 PM

## 2015-09-20 NOTE — Progress Notes (Signed)
To recovery, report to Mir[place], RN, VSS. 

## 2015-09-20 NOTE — Patient Instructions (Signed)
YOU HAD AN ENDOSCOPIC PROCEDURE TODAY AT [facility]:   Refer to the procedure report that was given to you for any specific questions about what was found during the examination.  If the procedure report does not answer your questions, please call your gastroenterologist to clarify.  If you requested that your care partner not be given the details of your procedure findings, then the procedure report has been included in a sealed envelope for you to review at your convenience later.  YOU SHOULD EXPECT: Some feelings of bloating in the abdomen. Passage of more gas than usual.  Walking can help get rid of the air that was put into your GI tract during the procedure and reduce the bloating. If you had a lower endoscopy (such as a colonoscopy or flexible sigmoidoscopy) you may notice spotting of blood in your stool or on the toilet paper. If you underwent a bowel prep for your procedure, you may not have a normal bowel movement for a few days.  Please Note:  You might notice some irritation and congestion in your nose or some drainage.  This is from the oxygen used during your procedure.  There is no need for concern and it should clear up in a day or so.  SYMPTOMS TO REPORT IMMEDIATELY:   Following lower endoscopy (colonoscopy or flexible sigmoidoscopy):  Excessive amounts of blood in the stool  Significant tenderness or worsening of abdominal pains  Swelling of the abdomen that is new, acute  Fever of 100F or higher   For urgent or emergent issues, a gastroenterologist can be reached at any hour by calling [phone].   DIET: Your first meal following the procedure should be a small meal and then it is ok to progress to your normal diet. Heavy or fried foods are harder to digest and may make you feel nauseous or bloated.  Likewise, meals heavy in dairy and vegetables can increase bloating.  Drink plenty of fluids but you should avoid alcoholic beverages for 24  hours.  ACTIVITY:  You should plan to take it easy for the rest of today and you should NOT DRIVE or use heavy machinery until tomorrow (because of the sedation medicines used during the test).    FOLLOW UP: Our staff will call the number listed on your records the next business day following your procedure to check on you and address any questions or concerns that you may have regarding the information given to you following your procedure. If we do not reach you, we will leave a message.  However, if you are feeling well and you are not experiencing any problems, there is no need to return our call.  We will assume that you have returned to your regular daily activities without incident.  If any biopsies were taken you will be contacted by phone or by letter within the next 1-3 weeks.  Please call us at [phone] if you have not heard about the biopsies in 3 weeks.    SIGNATURES/CONFIDENTIALITY: You and/or your care partner have signed paperwork which will be entered into your electronic medical record.  These signatures attest to the fact that that the information above on your After Visit Summary has been reviewed and is understood.  Full responsibility of the confidentiality of this discharge information lies with you and/or your care-partner.  Diverticulosis, hemorrhoids-handouts given  Continue current Laxative regimen.  Follow-up with primary MD as planned and Oncology as planned.

## 2015-09-21 ENCOUNTER — Telehealth: Payer: Self-pay | Admitting: *Deleted

## 2015-09-21 NOTE — Telephone Encounter (Signed)
  Follow up Call-  Call back number 09/20/2015  Post procedure Call Back phone  # [phone]  Permission to leave phone message Yes     Patient questions:  Do you have a fever, pain , or abdominal swelling? No. Pain Score  0 *  Have you tolerated food without any problems? Yes.    Have you been able to return to your normal activities? Yes.    Do you have any questions about your discharge instructions: Diet   No. Medications  No. Follow up visit  No.  Do you have questions or concerns about your Care? No.  Actions: * If pain score is 4 or above: No action needed, pain <4.

## 2015-09-22 ENCOUNTER — Telehealth: Payer: Self-pay | Admitting: Internal Medicine

## 2015-09-22 ENCOUNTER — Encounter: Payer: Self-pay | Admitting: Internal Medicine

## 2015-09-22 ENCOUNTER — Ambulatory Visit (HOSPITAL_BASED_OUTPATIENT_CLINIC_OR_DEPARTMENT_OTHER): Payer: PPO

## 2015-09-22 ENCOUNTER — Other Ambulatory Visit (HOSPITAL_BASED_OUTPATIENT_CLINIC_OR_DEPARTMENT_OTHER): Payer: PPO

## 2015-09-22 ENCOUNTER — Ambulatory Visit (HOSPITAL_BASED_OUTPATIENT_CLINIC_OR_DEPARTMENT_OTHER): Payer: PPO | Admitting: Internal Medicine

## 2015-09-22 VITALS — BP 136/59 | HR 71 | Temp 98.0°F | Resp 18 | Ht 68.0 in | Wt 165.9 lb

## 2015-09-22 DIAGNOSIS — C342 Malignant neoplasm of middle lobe, bronchus or lung: Secondary | ICD-10-CM

## 2015-09-22 DIAGNOSIS — Z5111 Encounter for antineoplastic chemotherapy: Secondary | ICD-10-CM

## 2015-09-22 DIAGNOSIS — C3411 Malignant neoplasm of upper lobe, right bronchus or lung: Secondary | ICD-10-CM

## 2015-09-22 LAB — CBC WITH DIFFERENTIAL/PLATELET
BASO%: 0 % (ref 0.0–2.0)
BASOS ABS: 0 10*3/uL (ref 0.0–0.1)
EOS ABS: 0 10*3/uL (ref 0.0–0.5)
EOS%: 0 % (ref 0.0–7.0)
HEMATOCRIT: 30.1 % — AB (ref 38.4–49.9)
HEMOGLOBIN: 10.3 g/dL — AB (ref 13.0–17.1)
LYMPH#: 1.5 10*3/uL (ref 0.9–3.3)
LYMPH%: 13.8 % — ABNORMAL LOW (ref 14.0–49.0)
MCH: 31.4 pg (ref 27.2–33.4)
MCHC: 34.2 g/dL (ref 32.0–36.0)
MCV: 91.8 fL (ref 79.3–98.0)
MONO#: 1.2 10*3/uL — AB (ref 0.1–0.9)
MONO%: 11.1 % (ref 0.0–14.0)
NEUT%: 75.1 % — ABNORMAL HIGH (ref 39.0–75.0)
NEUTROS ABS: 8 10*3/uL — AB (ref 1.5–6.5)
Platelets: 355 10*3/uL (ref 140–400)
RBC: 3.28 10*6/uL — AB (ref 4.20–5.82)
RDW: 14.6 % (ref 11.0–14.6)
WBC: 10.7 10*3/uL — AB (ref 4.0–10.3)

## 2015-09-22 LAB — COMPREHENSIVE METABOLIC PANEL
ALK PHOS: 75 U/L (ref 40–150)
ALT: 19 U/L (ref 0–55)
ANION GAP: 10 meq/L (ref 3–11)
AST: 22 U/L (ref 5–34)
Albumin: 3.3 g/dL — ABNORMAL LOW (ref 3.5–5.0)
BILIRUBIN TOTAL: 0.36 mg/dL (ref 0.20–1.20)
BUN: 11.3 mg/dL (ref 7.0–26.0)
CALCIUM: 9.4 mg/dL (ref 8.4–10.4)
CO2: 22 meq/L (ref 22–29)
CREATININE: 0.7 mg/dL (ref 0.7–1.3)
Chloride: 99 mEq/L (ref 98–109)
EGFR: 85 mL/min/{1.73_m2} — AB (ref >90)
Glucose: 200 mg/dl — ABNORMAL HIGH (ref 70–140)
Potassium: 4.5 mEq/L (ref 3.5–5.1)
Sodium: 130 mEq/L — ABNORMAL LOW (ref 136–145)
TOTAL PROTEIN: 6.4 g/dL (ref 6.4–8.3)

## 2015-09-22 MED ORDER — SODIUM CHLORIDE 0.9 % IV SOLN
Freq: Once | INTRAVENOUS | Status: AC
  Administered 2015-09-22: 12:00:00 via INTRAVENOUS
  Filled 2015-09-22: qty 8

## 2015-09-22 MED ORDER — SODIUM CHLORIDE 0.9 % IV SOLN
Freq: Once | INTRAVENOUS | Status: AC
  Administered 2015-09-22: 11:00:00 via INTRAVENOUS

## 2015-09-22 MED ORDER — HEPARIN SOD (PORK) LOCK FLUSH 100 UNIT/ML IV SOLN
500.0000 [IU] | Freq: Once | INTRAVENOUS | Status: AC | PRN
  Administered 2015-09-22: 500 [IU]
  Filled 2015-09-22: qty 5

## 2015-09-22 MED ORDER — SODIUM CHLORIDE 0.9 % IJ SOLN
10.0000 mL | INTRAMUSCULAR | Status: DC | PRN
  Administered 2015-09-22: 10 mL
  Filled 2015-09-22: qty 10

## 2015-09-22 MED ORDER — PEMETREXED DISODIUM CHEMO INJECTION 500 MG
500.0000 mg/m2 | Freq: Once | INTRAVENOUS | Status: AC
  Administered 2015-09-22: 950 mg via INTRAVENOUS
  Filled 2015-09-22: qty 38

## 2015-09-22 MED ORDER — CYANOCOBALAMIN 1000 MCG/ML IJ SOLN
1000.0000 ug | Freq: Once | INTRAMUSCULAR | Status: AC
  Administered 2015-09-22: 1000 ug via INTRAMUSCULAR

## 2015-09-22 MED ORDER — CYANOCOBALAMIN 1000 MCG/ML IJ SOLN
INTRAMUSCULAR | Status: AC
  Filled 2015-09-22: qty 1

## 2015-09-22 MED ORDER — SODIUM CHLORIDE 0.9 % IV SOLN
412.5000 mg | Freq: Once | INTRAVENOUS | Status: AC
  Administered 2015-09-22: 410 mg via INTRAVENOUS
  Filled 2015-09-22: qty 41

## 2015-09-22 NOTE — Telephone Encounter (Signed)
per pof to sch pt appt-gave pt copy of avs-adv pt central sch will call to sch trmt-gave contrast

## 2015-09-22 NOTE — Patient Instructions (Signed)
[facility] Discharge Instructions for Patients Receiving Chemotherapy  Today you received the following chemotherapy agents: Alimta/Carboplatin  To help prevent nausea and vomiting after your treatment, we encourage you to take your nausea medication as directed.   If you develop nausea and vomiting that is not controlled by your nausea medication, call the clinic.   BELOW ARE SYMPTOMS THAT SHOULD BE REPORTED IMMEDIATELY:  *FEVER GREATER THAN 100.5 F  *CHILLS WITH OR WITHOUT FEVER  NAUSEA AND VOMITING THAT IS NOT CONTROLLED WITH YOUR NAUSEA MEDICATION  *UNUSUAL SHORTNESS OF BREATH  *UNUSUAL BRUISING OR BLEEDING  TENDERNESS IN MOUTH AND THROAT WITH OR WITHOUT PRESENCE OF ULCERS  *URINARY PROBLEMS  *BOWEL PROBLEMS  UNUSUAL RASH Items with * indicate a potential emergency and should be followed up as soon as possible.  Feel free to call the clinic you have any questions or concerns. The clinic phone number is [phone].  Please show the CHEMO ALERT CARD at check-in to the Emergency Department and triage nurse.   

## 2015-09-22 NOTE — Progress Notes (Signed)
[facility] [phone]   [phone]  OFFICE PROGRESS NOTE  [name], MD [address] [place] [address]  DIAGNOSIS: stage IIIB (T1a, N3, M0) non-small cell lung cancer, adenocarcinoma with negative EGFR mutation, negative ALKgene translocation, negative ROS 1, but positive RET diagnosed in November 2016 and presented with right middle lobe lung nodule in addition to mediastinal and left supraclavicular lymphadenopathy.  PRIOR THERAPY: None  CURRENT THERAPY: Systemic chemotherapy with carboplatin for AUC of 5 and Alimta 500 MG/M2 every 3 weeks, first dose 08/11/2015. Status post 2 cycles.  INTERVAL HISTORY: Sean Cooke 82 y.o. male returns to the clinic today for follow-up visit accompanied by his wife. The patient is currently undergoing systemic chemotherapy with carboplatin and Alimta status post 2 cycles. He tolerated the second cycle of his treatment well but with increasing fatigue. He had flex sigmoidoscopy by Dr. Fuller Plan on 09/20/2015 and it showed moderate diverticulosis in the descending colon and sigmoid colon with grade 1 internal hemorrhoids. The patient is feeling much better today. He denied having any significant fever or chills. He has no nausea or vomiting. She is here today to start cycle #3 of his systemic chemotherapy.  MEDICAL HISTORY: Past Medical History  Diagnosis Date  . GERD (gastroesophageal reflux disease)   . Hiatal hernia   . Degenerative joint disease   . Rib fracture   . Diverticulosis   . Adenomatous colon polyp 05/2003  . Duodenal diverticulum   . Neuritis/radiculitis due to displacement of lumbar intervertebral disc   . Arthritis   . Pneumonia   . Hemorrhoids   . Encounter for antineoplastic chemotherapy 07/31/2015  . Lung cancer ([place])     ALLERGIES:  is allergic to benzonatate; lyrica; penicillins; pneumococcal vaccine polyvalent; tizanidine; and tramadol.  MEDICATIONS:    Current Outpatient Prescriptions  Medication Sig Dispense Refill  . alum & mag hydroxide-simeth (MAALOX PLUS) 400-400-40 MG/5ML suspension Take 5 mLs by mouth every 6 (six) hours as needed for indigestion. Reported on 08/20/2015    . aspirin 81 MG tablet Take 81 mg by mouth at bedtime.     . Calcium Carbonate-Vitamin D (CALCIUM + D PO) Take 1 capsule by mouth 2 (two) times daily.     . cromolyn (OPTICROM) 4 % ophthalmic solution Administer 1 drop to both eyes Four (4) times a day.    . denosumab (PROLIA) 60 MG/ML SOLN injection Inject 60 mg into the skin every 6 (six) months. Administer in upper arm, thigh, or abdomen    . dexamethasone (DECADRON) 4 MG tablet 4 mg by mouth twice a day the day before, day of and day after the chemotherapy every 3 weeks 40 tablet 1  . dextromethorphan-guaiFENesin (ROBITUSSIN-DM) 10-100 MG/5ML liquid Take 10 mLs by mouth every 4 (four) hours as needed for cough. Reported on 02/14/4192    . folic acid (FOLVITE) 1 MG tablet Take 1 tablet (1 mg total) by mouth daily. 30 tablet 4  . guaiFENesin (MUCINEX) 600 MG 12 hr tablet Take 600 mg by mouth 2 (two) times daily as needed.    . hydrocortisone (PROCTOSOL HC) 2.5 % rectal cream Place 1 application rectally 2 (two) times daily as needed for hemorrhoids. Reported on 09/01/2015    . lactobacillus acidophilus & bulgar (LACTINEX) chewable tablet Chew 1 tablet by mouth 2 (two) times daily. Store in [place], [place]    . lidocaine-prilocaine (EMLA) cream Apply 1 application topically as needed. 30 g  0  . loperamide (IMODIUM) 2 MG capsule Take 2 mg by mouth as needed for diarrhea or loose stools.    Marland Kitchen loratadine (CLARITIN) 10 MG tablet Take 10 mg by mouth.    . losartan (COZAAR) 50 MG tablet Take 50 mg by mouth daily.    . meloxicam (MOBIC) 7.5 MG tablet Take 7.5 mg by mouth daily as needed for pain. Reported on 08/18/2015    . Multiple Vitamin (MULTIVITAMIN) tablet Take 1 tablet by mouth daily.    . Omega-3 Fatty Acids (FISH OIL) 1000 MG  CAPS Take 1,000 mg by mouth daily.     Marland Kitchen oxybutynin (DITROPAN) 5 MG tablet Take 5 mg by mouth daily.    . pantoprazole (PROTONIX) 40 MG tablet Take 40 mg by mouth daily.    . Probiotic Product (ALIGN) 4 MG CAPS Take 1 capsule by mouth daily.    . prochlorperazine (COMPAZINE) 10 MG tablet TAKE 1 TABLET (10 MG TOTAL) BY MOUTH EVERY 6 (SIX) HOURS AS NEEDED FOR NAUSEA OR VOMITING. 30 tablet 0  . saw palmetto 160 MG capsule Take 160 mg by mouth daily.    . sorbitol 70 % solution TAKE 30 MLS BY MOUTH DAILY AS NEEDED FOR MODERATE CONSTIPATION. 473 mL 0  . Tamsulosin HCl (FLOMAX) 0.4 MG CAPS Take 0.4 mg by mouth daily.    Marland Kitchen acetaminophen (TYLENOL) 650 MG CR tablet Take 650 mg by mouth every 6 (six) hours as needed for pain or fever. Reported on 09/22/2015     No current facility-administered medications for this visit.    SURGICAL HISTORY:  Past Surgical History  Procedure Laterality Date  . Inguinal hernia repair    . Transurethral resection of prostate    . Splenectomy  2002  . Cataract extraction      bilateral  . Cholecystectomy  06/2003    REVIEW OF SYSTEMS:  A comprehensive review of systems was negative except for: Constitutional: positive for fatigue   PHYSICAL EXAMINATION: General appearance: alert, cooperative, fatigued and no distress Head: Normocephalic, without obvious abnormality, atraumatic Neck: no adenopathy, no JVD, supple, symmetrical, trachea midline and thyroid not enlarged, symmetric, no tenderness/mass/nodules Lymph nodes: Cervical, supraclavicular, and axillary nodes normal. Resp: clear to auscultation bilaterally Back: symmetric, no curvature. ROM normal. No CVA tenderness. Cardio: regular rate and rhythm, S1, S2 normal, no murmur, click, rub or gallop GI: soft, non-tender; bowel sounds normal; no masses,  no organomegaly Extremities: extremities normal, atraumatic, no cyanosis or edema Neurologic: Alert and oriented X 3, normal strength and tone. Normal symmetric  reflexes. Normal coordination and gait  ECOG PERFORMANCE STATUS: 1 - Symptomatic but completely ambulatory  Blood pressure 136/59, pulse 71, temperature 98 F (36.7 C), temperature source Oral, resp. rate 18, height 5' 8"  (1.727 m), weight 165 lb 14.4 oz (75.252 kg), SpO2 99 %.  LABORATORY DATA: Lab Results  Component Value Date   WBC 10.7* 09/22/2015   HGB 10.3* 09/22/2015   HCT 30.1* 09/22/2015   MCV 91.8 09/22/2015   PLT 355 09/22/2015      Chemistry      Component Value Date/Time   NA 130* 09/22/2015 0904   NA 133* 08/30/2015 1150   K 4.5 09/22/2015 0904   K 4.1 08/30/2015 1150   CL 100* 08/30/2015 1150   CO2 22 09/22/2015 0904   CO2 24 08/30/2015 1150   BUN 11.3 09/22/2015 0904   BUN 8 08/30/2015 1150   CREATININE 0.7 09/22/2015 0904   CREATININE 0.55* 08/30/2015 1150  Component Value Date/Time   CALCIUM 9.4 09/22/2015 0904   CALCIUM 9.8 08/30/2015 1150   ALKPHOS 75 09/22/2015 0904   ALKPHOS 62 08/30/2015 1150   AST 22 09/22/2015 0904   AST 28 08/30/2015 1150   ALT 19 09/22/2015 0904   ALT 25 08/30/2015 1150   BILITOT 0.36 09/22/2015 0904   BILITOT 0.8 08/30/2015 1150       RADIOGRAPHIC STUDIES: Ir Fluoro Guide Cv Line Right  08/30/2015  CLINICAL DATA:  Metastatic lung carcinoma and need for porta cath for continued chemotherapy administration. EXAM: IMPLANTED PORT A CATH PLACEMENT WITH ULTRASOUND AND FLUOROSCOPIC GUIDANCE ANESTHESIA/SEDATION: 1.0 Mg IV Versed; 25 mcg IV Fentanyl Total Moderate Sedation Time: 30 minutes. The patient was monitored continuously during the procedure by Radiology nursing. Additional Medications: 900 mg IV clindamycin. IV antibiotic was given in a appropriate time interval prior to skin puncture. FLUOROSCOPY TIME:  24 seconds. PROCEDURE: The procedure, risks, benefits, and alternatives were explained to the patient. Questions regarding the procedure were encouraged and answered. The patient understands and consents to the  procedure. The right neck and chest were prepped with chlorhexidine in a sterile fashion, and a sterile drape was applied covering the operative field. Maximum barrier sterile technique with sterile gowns and gloves were used for the procedure. Local anesthesia was provided with 1% lidocaine. After creating a small venotomy incision, a 21 gauge needle was advanced into the right internal jugular vein under direct, real-time ultrasound guidance. Ultrasound image documentation was performed. After securing guidewire access, an 8 Fr dilator was placed. A J-wire was kinked to measure appropriate catheter length. A subcutaneous port pocket was then created along the upper chest wall utilizing sharp and blunt dissection. Portable cautery was utilized. The pocket was irrigated with sterile saline. A single lumen power injectable port was chosen for placement. The 8 Fr catheter was tunneled from the port pocket site to the venotomy incision. The port was placed in the pocket. External catheter was trimmed to appropriate length based on guidewire measurement. At the venotomy, an 8 Fr peel-away sheath was placed over a guidewire. The catheter was then placed through the sheath and the sheath removed. Final catheter positioning was confirmed and documented with a fluoroscopic spot image. The port was accessed with a needle and aspirated and flushed with heparinized saline. The needle was removed. The venotomy and port pocket incisions were closed with subcutaneous 3-0 Monocryl and subcuticular 4-0 Vicryl. Dermabond was applied to both incisions. COMPLICATIONS: None FINDINGS: After catheter placement, the tip lies at the cavoatrial junction. The catheter aspirates normally and is ready for immediate use. IMPRESSION: Placement of single lumen port a cath via right internal jugular vein. The catheter tip lies at the cavoatrial junction. A power injectable port a cath was placed and is ready for immediate use. Electronically  Signed   By: Aletta Edouard M.D.   On: 08/30/2015 16:07   Ir US Guide Vasc Access Right  08/30/2015  CLINICAL DATA:  Metastatic lung carcinoma and need for porta cath for continued chemotherapy administration. EXAM: IMPLANTED PORT A CATH PLACEMENT WITH ULTRASOUND AND FLUOROSCOPIC GUIDANCE ANESTHESIA/SEDATION: 1.0 Mg IV Versed; 25 mcg IV Fentanyl Total Moderate Sedation Time: 30 minutes. The patient was monitored continuously during the procedure by Radiology nursing. Additional Medications: 900 mg IV clindamycin. IV antibiotic was given in a appropriate time interval prior to skin puncture. FLUOROSCOPY TIME:  24 seconds. PROCEDURE: The procedure, risks, benefits, and alternatives were explained to the patient. Questions regarding the procedure  were encouraged and answered. The patient understands and consents to the procedure. The right neck and chest were prepped with chlorhexidine in a sterile fashion, and a sterile drape was applied covering the operative field. Maximum barrier sterile technique with sterile gowns and gloves were used for the procedure. Local anesthesia was provided with 1% lidocaine. After creating a small venotomy incision, a 21 gauge needle was advanced into the right internal jugular vein under direct, real-time ultrasound guidance. Ultrasound image documentation was performed. After securing guidewire access, an 8 Fr dilator was placed. A J-wire was kinked to measure appropriate catheter length. A subcutaneous port pocket was then created along the upper chest wall utilizing sharp and blunt dissection. Portable cautery was utilized. The pocket was irrigated with sterile saline. A single lumen power injectable port was chosen for placement. The 8 Fr catheter was tunneled from the port pocket site to the venotomy incision. The port was placed in the pocket. External catheter was trimmed to appropriate length based on guidewire measurement. At the venotomy, an 8 Fr peel-away sheath was  placed over a guidewire. The catheter was then placed through the sheath and the sheath removed. Final catheter positioning was confirmed and documented with a fluoroscopic spot image. The port was accessed with a needle and aspirated and flushed with heparinized saline. The needle was removed. The venotomy and port pocket incisions were closed with subcutaneous 3-0 Monocryl and subcuticular 4-0 Vicryl. Dermabond was applied to both incisions. COMPLICATIONS: None FINDINGS: After catheter placement, the tip lies at the cavoatrial junction. The catheter aspirates normally and is ready for immediate use. IMPRESSION: Placement of single lumen port a cath via right internal jugular vein. The catheter tip lies at the cavoatrial junction. A power injectable port a cath was placed and is ready for immediate use. Electronically Signed   By: Aletta Edouard M.D.   On: 08/30/2015 16:07    ASSESSMENT AND PLAN: This is a very pleasant 82 years old white male with a stage IIIB non-small cell lung cancer with negative EGFR mutation, negative ALK gene translocation and PDL 1 expression 10%. The patient is not eligible for concurrent chemoradiation because of the large radiotherapy field. The patient is currently undergoing systemic chemotherapy with carboplatin and Alimta status post 2 cycles. I recommended for the patient to continue his current treatment with carboplatin and Alimta. He would come back for follow-up visit in 3 weeks for reevaluation after repeating CT scan of the chest, abdomen and pelvis for restaging of his disease. He was advised to call immediately if he has any concerning symptoms in the interval. The patient voices understanding of current disease status and treatment options and is in agreement with the current care plan.  All questions were answered. The patient knows to call the clinic with any problems, questions or concerns. We can certainly see the patient much sooner if  necessary.  Disclaimer: This note was dictated with voice recognition software. Similar sounding words can inadvertently be transcribed and may not be corrected upon review.

## 2015-09-24 ENCOUNTER — Telehealth: Payer: Self-pay | Admitting: *Deleted

## 2015-09-24 NOTE — Telephone Encounter (Signed)
Received call from pt wanting to know what else to do for constipation.  Spoke with pt and was informed that last BM was on Wed 09/22/15.  Pt has been taking Sorbitol daily as prescribed.  Asked if pt has been drinking enough water; pt stated " NO ".  Instructed pt to increase water intake as tolerated, eat prunes, and/or drink prune juice, eat apple sauce.  Pt verbalized understanding, and understood to call office back if still no BMs. Pt's  Phone   [phone].

## 2015-09-27 ENCOUNTER — Other Ambulatory Visit: Payer: Self-pay | Admitting: *Deleted

## 2015-09-27 DIAGNOSIS — D473 Essential (hemorrhagic) thrombocythemia: Secondary | ICD-10-CM

## 2015-09-27 MED ORDER — LIDOCAINE-PRILOCAINE 2.5-2.5 % EX CREA: 1.0000 "application " | TOPICAL_CREAM | CUTANEOUS | Status: AC | PRN

## 2015-09-29 ENCOUNTER — Other Ambulatory Visit (HOSPITAL_BASED_OUTPATIENT_CLINIC_OR_DEPARTMENT_OTHER): Payer: PPO

## 2015-09-29 DIAGNOSIS — C3411 Malignant neoplasm of upper lobe, right bronchus or lung: Secondary | ICD-10-CM

## 2015-09-29 LAB — CBC WITH DIFFERENTIAL/PLATELET
BASO%: 0.5 % (ref 0.0–2.0)
BASOS ABS: 0 10*3/uL (ref 0.0–0.1)
EOS%: 2.3 % (ref 0.0–7.0)
Eosinophils Absolute: 0.1 10*3/uL (ref 0.0–0.5)
HEMATOCRIT: 31.6 % — AB (ref 38.4–49.9)
HEMOGLOBIN: 10.6 g/dL — AB (ref 13.0–17.1)
LYMPH#: 1.3 10*3/uL (ref 0.9–3.3)
LYMPH%: 34.4 % (ref 14.0–49.0)
MCH: 31.3 pg (ref 27.2–33.4)
MCHC: 33.5 g/dL (ref 32.0–36.0)
MCV: 93.2 fL (ref 79.3–98.0)
MONO#: 0.4 10*3/uL (ref 0.1–0.9)
MONO%: 10 % (ref 0.0–14.0)
NEUT%: 52.8 % (ref 39.0–75.0)
NEUTROS ABS: 2.1 10*3/uL (ref 1.5–6.5)
Platelets: 186 10*3/uL (ref 140–400)
RBC: 3.39 10*6/uL — ABNORMAL LOW (ref 4.20–5.82)
RDW: 14.6 % (ref 11.0–14.6)
WBC: 3.9 10*3/uL — AB (ref 4.0–10.3)

## 2015-09-29 LAB — COMPREHENSIVE METABOLIC PANEL
ALBUMIN: 3.3 g/dL — AB (ref 3.5–5.0)
ALK PHOS: 67 U/L (ref 40–150)
ALT: 41 U/L (ref 0–55)
AST: 42 U/L — AB (ref 5–34)
Anion Gap: 7 mEq/L (ref 3–11)
BUN: 8 mg/dL (ref 7.0–26.0)
CALCIUM: 9.6 mg/dL (ref 8.4–10.4)
CO2: 26 mEq/L (ref 22–29)
Chloride: 99 mEq/L (ref 98–109)
Creatinine: 0.7 mg/dL (ref 0.7–1.3)
EGFR: 87 mL/min/{1.73_m2} — ABNORMAL LOW (ref >90)
GLUCOSE: 106 mg/dL (ref 70–140)
Potassium: 4.3 mEq/L (ref 3.5–5.1)
Sodium: 132 mEq/L — ABNORMAL LOW (ref 136–145)
TOTAL PROTEIN: 6 g/dL — AB (ref 6.4–8.3)
Total Bilirubin: 0.78 mg/dL (ref 0.20–1.20)

## 2015-10-06 ENCOUNTER — Other Ambulatory Visit (HOSPITAL_BASED_OUTPATIENT_CLINIC_OR_DEPARTMENT_OTHER): Payer: PPO

## 2015-10-06 ENCOUNTER — Other Ambulatory Visit: Payer: Self-pay | Admitting: Internal Medicine

## 2015-10-06 DIAGNOSIS — C3411 Malignant neoplasm of upper lobe, right bronchus or lung: Secondary | ICD-10-CM

## 2015-10-06 DIAGNOSIS — C342 Malignant neoplasm of middle lobe, bronchus or lung: Secondary | ICD-10-CM | POA: Diagnosis not present

## 2015-10-06 LAB — CBC WITH DIFFERENTIAL/PLATELET
BASO%: 0.2 % (ref 0.0–2.0)
BASOS ABS: 0 10*3/uL (ref 0.0–0.1)
EOS ABS: 0 10*3/uL (ref 0.0–0.5)
EOS%: 0.2 % (ref 0.0–7.0)
HCT: 30.3 % — ABNORMAL LOW (ref 38.4–49.9)
HGB: 10 g/dL — ABNORMAL LOW (ref 13.0–17.1)
LYMPH#: 1 10*3/uL (ref 0.9–3.3)
LYMPH%: 9.6 % — ABNORMAL LOW (ref 14.0–49.0)
MCH: 31.3 pg (ref 27.2–33.4)
MCHC: 32.9 g/dL (ref 32.0–36.0)
MCV: 94.9 fL (ref 79.3–98.0)
MONO#: 1.4 10*3/uL — AB (ref 0.1–0.9)
MONO%: 13.6 % (ref 0.0–14.0)
NEUT#: 7.8 10*3/uL — ABNORMAL HIGH (ref 1.5–6.5)
NEUT%: 76.4 % — AB (ref 39.0–75.0)
PLATELETS: 173 10*3/uL (ref 140–400)
RBC: 3.19 10*6/uL — AB (ref 4.20–5.82)
RDW: 15.1 % — ABNORMAL HIGH (ref 11.0–14.6)
WBC: 10.2 10*3/uL (ref 4.0–10.3)

## 2015-10-06 LAB — COMPREHENSIVE METABOLIC PANEL
ALT: 32 U/L (ref 0–55)
ANION GAP: 8 meq/L (ref 3–11)
AST: 29 U/L (ref 5–34)
Albumin: 3.4 g/dL — ABNORMAL LOW (ref 3.5–5.0)
Alkaline Phosphatase: 72 U/L (ref 40–150)
BUN: 8.6 mg/dL (ref 7.0–26.0)
CHLORIDE: 99 meq/L (ref 98–109)
CO2: 25 meq/L (ref 22–29)
CREATININE: 0.7 mg/dL (ref 0.7–1.3)
Calcium: 9.7 mg/dL (ref 8.4–10.4)
EGFR: 87 mL/min/{1.73_m2} — AB (ref >90)
GLUCOSE: 122 mg/dL (ref 70–140)
Potassium: 3.9 mEq/L (ref 3.5–5.1)
SODIUM: 132 meq/L — AB (ref 136–145)
Total Bilirubin: 0.65 mg/dL (ref 0.20–1.20)
Total Protein: 6.4 g/dL (ref 6.4–8.3)

## 2015-10-11 ENCOUNTER — Encounter (HOSPITAL_COMMUNITY): Payer: Self-pay

## 2015-10-11 ENCOUNTER — Ambulatory Visit (HOSPITAL_COMMUNITY)
Admission: RE | Admit: 2015-10-11 | Discharge: 2015-10-11 | Disposition: A | Discharge status: Home / Self Care | Payer: PPO | Source: Ambulatory Visit | Attending: Internal Medicine | Admitting: Internal Medicine

## 2015-10-11 DIAGNOSIS — I313 Pericardial effusion (noninflammatory): Secondary | ICD-10-CM | POA: Diagnosis not present

## 2015-10-11 DIAGNOSIS — C3411 Malignant neoplasm of upper lobe, right bronchus or lung: Secondary | ICD-10-CM | POA: Diagnosis not present

## 2015-10-11 DIAGNOSIS — N200 Calculus of kidney: Secondary | ICD-10-CM | POA: Insufficient documentation

## 2015-10-11 DIAGNOSIS — C342 Malignant neoplasm of middle lobe, bronchus or lung: Secondary | ICD-10-CM | POA: Diagnosis not present

## 2015-10-11 DIAGNOSIS — I2510 Atherosclerotic heart disease of native coronary artery without angina pectoris: Secondary | ICD-10-CM | POA: Diagnosis not present

## 2015-10-11 DIAGNOSIS — K769 Liver disease, unspecified: Secondary | ICD-10-CM | POA: Diagnosis not present

## 2015-10-11 MED ORDER — IOHEXOL 300 MG/ML  SOLN
100.0000 mL | Freq: Once | INTRAMUSCULAR | Status: AC | PRN
  Administered 2015-10-11: 100 mL via INTRAVENOUS

## 2015-10-13 ENCOUNTER — Ambulatory Visit (HOSPITAL_BASED_OUTPATIENT_CLINIC_OR_DEPARTMENT_OTHER): Payer: PPO | Admitting: Internal Medicine

## 2015-10-13 ENCOUNTER — Encounter: Payer: Self-pay | Admitting: Internal Medicine

## 2015-10-13 ENCOUNTER — Ambulatory Visit (HOSPITAL_BASED_OUTPATIENT_CLINIC_OR_DEPARTMENT_OTHER): Payer: PPO

## 2015-10-13 ENCOUNTER — Other Ambulatory Visit (HOSPITAL_BASED_OUTPATIENT_CLINIC_OR_DEPARTMENT_OTHER): Payer: PPO

## 2015-10-13 ENCOUNTER — Telehealth: Payer: Self-pay | Admitting: *Deleted

## 2015-10-13 ENCOUNTER — Telehealth: Payer: Self-pay | Admitting: Internal Medicine

## 2015-10-13 VITALS — BP 145/66 | HR 60 | Temp 98.3°F | Resp 18 | Ht 68.0 in | Wt 166.1 lb

## 2015-10-13 DIAGNOSIS — C3411 Malignant neoplasm of upper lobe, right bronchus or lung: Secondary | ICD-10-CM

## 2015-10-13 DIAGNOSIS — C342 Malignant neoplasm of middle lobe, bronchus or lung: Secondary | ICD-10-CM

## 2015-10-13 DIAGNOSIS — Z5111 Encounter for antineoplastic chemotherapy: Secondary | ICD-10-CM

## 2015-10-13 DIAGNOSIS — D649 Anemia, unspecified: Secondary | ICD-10-CM

## 2015-10-13 LAB — CBC WITH DIFFERENTIAL/PLATELET
BASO%: 0.1 % (ref 0.0–2.0)
BASOS ABS: 0 10*3/uL (ref 0.0–0.1)
EOS ABS: 0 10*3/uL (ref 0.0–0.5)
EOS%: 0 % (ref 0.0–7.0)
HCT: 29.6 % — ABNORMAL LOW (ref 38.4–49.9)
HGB: 9.9 g/dL — ABNORMAL LOW (ref 13.0–17.1)
LYMPH%: 10 % — AB (ref 14.0–49.0)
MCH: 32 pg (ref 27.2–33.4)
MCHC: 33.6 g/dL (ref 32.0–36.0)
MCV: 95.5 fL (ref 79.3–98.0)
MONO#: 1.4 10*3/uL — AB (ref 0.1–0.9)
MONO%: 18.3 % — AB (ref 0.0–14.0)
NEUT%: 71.6 % (ref 39.0–75.0)
NEUTROS ABS: 5.6 10*3/uL (ref 1.5–6.5)
PLATELETS: 498 10*3/uL — AB (ref 140–400)
RBC: 3.1 10*6/uL — AB (ref 4.20–5.82)
RDW: 16.4 % — ABNORMAL HIGH (ref 11.0–14.6)
WBC: 7.8 10*3/uL (ref 4.0–10.3)
lymph#: 0.8 10*3/uL — ABNORMAL LOW (ref 0.9–3.3)

## 2015-10-13 LAB — COMPREHENSIVE METABOLIC PANEL WITH GFR
ALT: 23 U/L (ref 0–55)
AST: 32 U/L (ref 5–34)
Albumin: 3.5 g/dL (ref 3.5–5.0)
Alkaline Phosphatase: 66 U/L (ref 40–150)
Anion Gap: 6 meq/L (ref 3–11)
BUN: 9.4 mg/dL (ref 7.0–26.0)
CO2: 24 meq/L (ref 22–29)
Calcium: 9.3 mg/dL (ref 8.4–10.4)
Chloride: 97 meq/L — ABNORMAL LOW (ref 98–109)
Creatinine: 0.7 mg/dL (ref 0.7–1.3)
EGFR: 85 ml/min/1.73 m2 — ABNORMAL LOW
Glucose: 110 mg/dL (ref 70–140)
Potassium: 4.3 meq/L (ref 3.5–5.1)
Sodium: 127 meq/L — ABNORMAL LOW (ref 136–145)
Total Bilirubin: 0.3 mg/dL (ref 0.20–1.20)
Total Protein: 6.4 g/dL (ref 6.4–8.3)

## 2015-10-13 MED ORDER — SODIUM CHLORIDE 0.9 % IV SOLN
412.5000 mg | Freq: Once | INTRAVENOUS | Status: AC
  Administered 2015-10-13: 410 mg via INTRAVENOUS
  Filled 2015-10-13: qty 41

## 2015-10-13 MED ORDER — SODIUM CHLORIDE 0.9 % IV SOLN
500.0000 mg/m2 | Freq: Once | INTRAVENOUS | Status: AC
  Administered 2015-10-13: 950 mg via INTRAVENOUS
  Filled 2015-10-13: qty 38

## 2015-10-13 MED ORDER — SODIUM CHLORIDE 0.9 % IJ SOLN
10.0000 mL | INTRAMUSCULAR | Status: DC | PRN
  Administered 2015-10-13: 10 mL
  Filled 2015-10-13: qty 10

## 2015-10-13 MED ORDER — SODIUM CHLORIDE 0.9 % IV SOLN
Freq: Once | INTRAVENOUS | Status: AC
  Administered 2015-10-13: 14:00:00 via INTRAVENOUS

## 2015-10-13 MED ORDER — SODIUM CHLORIDE 0.9 % IV SOLN
Freq: Once | INTRAVENOUS | Status: AC
  Administered 2015-10-13: 14:00:00 via INTRAVENOUS
  Filled 2015-10-13: qty 8

## 2015-10-13 MED ORDER — HEPARIN SOD (PORK) LOCK FLUSH 100 UNIT/ML IV SOLN
500.0000 [IU] | Freq: Once | INTRAVENOUS | Status: AC | PRN
  Administered 2015-10-13: 500 [IU]
  Filled 2015-10-13: qty 5

## 2015-10-13 NOTE — Progress Notes (Signed)
[place] [phone]   [phone]  OFFICE PROGRESS NOTE  [name], MD [id number] [employer] [place] [address]  DIAGNOSIS: stage IIIB (T1a, N3, M0) non-small cell lung cancer, adenocarcinoma with negative EGFR mutation, negative ALKgene translocation, negative ROS 1, but positive RET diagnosed in November 2016 and presented with right middle lobe lung nodule in addition to mediastinal and left supraclavicular lymphadenopathy.  PRIOR THERAPY: None  CURRENT THERAPY: Systemic chemotherapy with carboplatin for AUC of 5 and Alimta 500 MG/M2 every 3 weeks, first dose 08/11/2015. Status post 3 cycles.  INTERVAL HISTORY: BRITON SELLMAN 82 y.o. male returns to the clinic today for follow-up visit accompanied by his wife and niece. The patient is currently undergoing systemic chemotherapy with carboplatin and Alimta status post 3 cycles. He tolerated the last cycle of his treatment well except for mild fatigue a few days after the chemotherapy. He recently underwent 2 root canal procedure. The patient is feeling much better today. He denied having any significant fever or chills. He has no nausea or vomiting. He had repeat CT scan of the chest, abdomen and pelvis performed recently and he is here for evaluation and discussion of his scan results.  MEDICAL HISTORY: Past Medical History  Diagnosis Date  . GERD (gastroesophageal reflux disease)   . Hiatal hernia   . Degenerative joint disease   . Rib fracture   . Diverticulosis   . Adenomatous colon polyp 05/2003  . Duodenal diverticulum   . Neuritis/radiculitis due to displacement of lumbar intervertebral disc   . Arthritis   . Pneumonia   . Hemorrhoids   . Encounter for antineoplastic chemotherapy 07/31/2015  . Lung cancer ([place])     ALLERGIES:  is allergic to benzonatate; lyrica; penicillins; pneumococcal vaccine polyvalent; tizanidine; and  tramadol.  MEDICATIONS:  Current Outpatient Prescriptions  Medication Sig Dispense Refill  . acetaminophen (TYLENOL) 650 MG CR tablet Take 650 mg by mouth every 6 (six) hours as needed for pain or fever. Reported on 09/22/2015    . alum & mag hydroxide-simeth (MAALOX PLUS) 400-400-40 MG/5ML suspension Take 5 mLs by mouth every 6 (six) hours as needed for indigestion. Reported on 08/20/2015    . aspirin 81 MG tablet Take 81 mg by mouth at bedtime.     . Calcium Carbonate-Vitamin D (CALCIUM + D PO) Take 1 capsule by mouth 2 (two) times daily.     . clindamycin (CLEOCIN) 150 MG capsule Take 150 mg by mouth every 6 (six) hours.  0  . cromolyn (OPTICROM) 4 % ophthalmic solution Administer 1 drop to both eyes Four (4) times a day.    . denosumab (PROLIA) 60 MG/ML SOLN injection Inject 60 mg into the skin every 6 (six) months. Administer in upper arm, thigh, or abdomen    . dexamethasone (DECADRON) 4 MG tablet 4 mg by mouth twice a day the day before, day of and day after the chemotherapy every 3 weeks 40 tablet 1  . folic acid (FOLVITE) 1 MG tablet Take 1 tablet (1 mg total) by mouth daily. 30 tablet 4  . guaiFENesin (MUCINEX) 600 MG 12 hr tablet Take 600 mg by mouth 2 (two) times daily as needed.    . hydrocortisone (PROCTOSOL HC) 2.5 % rectal cream Place 1 application rectally 2 (two) times daily as needed for hemorrhoids. Reported on 09/01/2015    . lactobacillus acidophilus & bulgar (LACTINEX) chewable tablet Chew 1 tablet by mouth  2 (two) times daily. Store in [place], [place]    . lidocaine-prilocaine (EMLA) cream Apply 1 application topically as needed. 30 g 3  . loratadine (CLARITIN) 10 MG tablet Take 10 mg by mouth.    . losartan (COZAAR) 50 MG tablet Take 50 mg by mouth daily.    . Multiple Vitamin (MULTIVITAMIN) tablet Take 1 tablet by mouth daily.    . Omega-3 Fatty Acids (FISH OIL) 1000 MG CAPS Take 1,000 mg by mouth daily.     Marland Kitchen oxybutynin (DITROPAN) 5 MG tablet Take 5 mg by mouth daily.    .  pantoprazole (PROTONIX) 40 MG tablet Take 40 mg by mouth daily.    . Probiotic Product (ALIGN) 4 MG CAPS Take 1 capsule by mouth daily.    . prochlorperazine (COMPAZINE) 10 MG tablet TAKE 1 TABLET (10 MG TOTAL) BY MOUTH EVERY 6 (SIX) HOURS AS NEEDED FOR NAUSEA OR VOMITING. 30 tablet 0  . saw palmetto 160 MG capsule Take 160 mg by mouth daily.    . Tamsulosin HCl (FLOMAX) 0.4 MG CAPS Take 0.4 mg by mouth daily.    Marland Kitchen dextromethorphan-guaiFENesin (ROBITUSSIN-DM) 10-100 MG/5ML liquid Take 10 mLs by mouth every 4 (four) hours as needed for cough. Reported on 10/13/2015    . loperamide (IMODIUM) 2 MG capsule Take 2 mg by mouth as needed for diarrhea or loose stools. Reported on 10/13/2015    . meloxicam (MOBIC) 7.5 MG tablet Take 7.5 mg by mouth daily as needed for pain. Reported on 10/13/2015    . sorbitol 70 % solution TAKE 30 MLS BY MOUTH DAILY AS NEEDED FOR MODERATE CONSTIPATION. (Patient not taking: Reported on 10/13/2015) 473 mL 0   No current facility-administered medications for this visit.    SURGICAL HISTORY:  Past Surgical History  Procedure Laterality Date  . Inguinal hernia repair    . Transurethral resection of prostate    . Splenectomy  2002  . Cataract extraction      bilateral  . Cholecystectomy  06/2003    REVIEW OF SYSTEMS:  Constitutional: positive for fatigue Eyes: negative Ears, nose, mouth, throat, and face: negative Respiratory: negative Cardiovascular: negative Gastrointestinal: negative Genitourinary:negative Integument/breast: negative Hematologic/lymphatic: negative Musculoskeletal:negative Neurological: negative Behavioral/Psych: negative Endocrine: negative Allergic/Immunologic: negative   PHYSICAL EXAMINATION: General appearance: alert, cooperative, fatigued and no distress Head: Normocephalic, without obvious abnormality, atraumatic Neck: no adenopathy, no JVD, supple, symmetrical, trachea midline and thyroid not enlarged, symmetric, no  tenderness/mass/nodules Lymph nodes: Cervical, supraclavicular, and axillary nodes normal. Resp: clear to auscultation bilaterally Back: symmetric, no curvature. ROM normal. No CVA tenderness. Cardio: regular rate and rhythm, S1, S2 normal, no murmur, click, rub or gallop GI: soft, non-tender; bowel sounds normal; no masses,  no organomegaly Extremities: extremities normal, atraumatic, no cyanosis or edema Neurologic: Alert and oriented X 3, normal strength and tone. Normal symmetric reflexes. Normal coordination and gait  ECOG PERFORMANCE STATUS: 1 - Symptomatic but completely ambulatory  Blood pressure 145/66, pulse 60, temperature 98.3 F (36.8 C), temperature source Oral, resp. rate 18, height 5' 8"  (1.727 m), weight 166 lb 1.6 oz (75.342 kg), SpO2 99 %.  LABORATORY DATA: Lab Results  Component Value Date   WBC 7.8 10/13/2015   HGB 9.9* 10/13/2015   HCT 29.6* 10/13/2015   MCV 95.5 10/13/2015   PLT 498* 10/13/2015      Chemistry      Component Value Date/Time   NA 127* 10/13/2015 1116   NA 133* 08/30/2015 1150   K 4.3 10/13/2015  1116   K 4.1 08/30/2015 1150   CL 100* 08/30/2015 1150   CO2 24 10/13/2015 1116   CO2 24 08/30/2015 1150   BUN 9.4 10/13/2015 1116   BUN 8 08/30/2015 1150   CREATININE 0.7 10/13/2015 1116   CREATININE 0.55* 08/30/2015 1150      Component Value Date/Time   CALCIUM 9.3 10/13/2015 1116   CALCIUM 9.8 08/30/2015 1150   ALKPHOS 66 10/13/2015 1116   ALKPHOS 62 08/30/2015 1150   AST 32 10/13/2015 1116   AST 28 08/30/2015 1150   ALT 23 10/13/2015 1116   ALT 25 08/30/2015 1150   BILITOT 0.30 10/13/2015 1116   BILITOT 0.8 08/30/2015 1150       RADIOGRAPHIC STUDIES: Ct Chest W Contrast  10/11/2015  CLINICAL DATA:  Lung cancer, chemotherapy in progress. EXAM: CT CHEST, ABDOMEN, AND PELVIS WITH CONTRAST TECHNIQUE: Multidetector CT imaging of the chest, abdomen and pelvis was performed following the standard protocol during bolus administration of  intravenous contrast. CONTRAST:  123m OMNIPAQUE IOHEXOL 300 MG/ML  SOLN COMPARISON:  CT abdomen pelvis 08/14/2015, PET 06/09/2015 and CT chest 05/27/2015. FINDINGS: CT CHEST FINDINGS Mediastinum/Lymph Nodes: Right IJ Port-A-Cath terminates in the low SVC. Mediastinal lymph nodes are not enlarged by CT size criteria. Right hilar lymph node measures 10 mm, stable. No left hilar or axillary adenopathy. Three-vessel coronary artery calcification. Heart size normal. Moderate pericardial effusion, stable. Lungs/Pleura: A nodule in the lateral segment right middle lobe measures 8 x 9 mm (series 4, image 33), previously 11 x 17 mm. Surrounding ground-glass and volume loss, similar. Left lung is clear. No pleural fluid. Airway is unremarkable. Musculoskeletal: No worrisome lytic or sclerotic lesions. CT ABDOMEN PELVIS FINDINGS Hepatobiliary: Low-attenuation lesion in the dome of the liver measures 10 mm, stable and too small to characterize. Cholecystectomy. No biliary ductal dilatation. Pancreas: Negative. Spleen: Surgically absent with regenerative splenules in the left upper quadrant. Adrenals/Urinary Tract: Adrenal glands are unremarkable. 11 mm low-attenuation lesion in the right kidney is unchanged and likely a cyst although too small to definitively characterize. Left renal stone. Kidneys are otherwise unremarkable. Ureters are decompressed. Bladder is grossly unremarkable. Stomach/Bowel: Stomach is unremarkable. Duodenal diverticulum. Small bowel, appendix and colon are otherwise unremarkable. Vascular/Lymphatic: Atherosclerotic calcification of the arterial vasculature without abdominal aortic aneurysm. No pathologically enlarged lymph nodes. Reproductive: Patient is reportedly status post trans urethral resection of the prostate. Other: No free fluid. Small sub xiphoid and supraumbilical midline ventral hernias contain fat. Mesenteries and peritoneum are otherwise unremarkable. Musculoskeletal: No worrisome lytic  or sclerotic lesions. Degenerative changes are seen in the spine. IMPRESSION: 1. Interval response to therapy as evidenced by decrease in size of a right middle lobe nodule, with surrounding ground-glass and volume loss, as before. 2. Moderate pericardial effusion, stable. 3. Left renal stone. 4. Three-vessel coronary artery calcification. Electronically Signed   By: MLorin PicketM.D.   On: 10/11/2015 11:31   Ct Abdomen Pelvis W Contrast  10/11/2015  CLINICAL DATA:  Lung cancer, chemotherapy in progress. EXAM: CT CHEST, ABDOMEN, AND PELVIS WITH CONTRAST TECHNIQUE: Multidetector CT imaging of the chest, abdomen and pelvis was performed following the standard protocol during bolus administration of intravenous contrast. CONTRAST:  1041mOMNIPAQUE IOHEXOL 300 MG/ML  SOLN COMPARISON:  CT abdomen pelvis 08/14/2015, PET 06/09/2015 and CT chest 05/27/2015. FINDINGS: CT CHEST FINDINGS Mediastinum/Lymph Nodes: Right IJ Port-A-Cath terminates in the low SVC. Mediastinal lymph nodes are not enlarged by CT size criteria. Right hilar lymph node measures 10 mm, stable.  No left hilar or axillary adenopathy. Three-vessel coronary artery calcification. Heart size normal. Moderate pericardial effusion, stable. Lungs/Pleura: A nodule in the lateral segment right middle lobe measures 8 x 9 mm (series 4, image 33), previously 11 x 17 mm. Surrounding ground-glass and volume loss, similar. Left lung is clear. No pleural fluid. Airway is unremarkable. Musculoskeletal: No worrisome lytic or sclerotic lesions. CT ABDOMEN PELVIS FINDINGS Hepatobiliary: Low-attenuation lesion in the dome of the liver measures 10 mm, stable and too small to characterize. Cholecystectomy. No biliary ductal dilatation. Pancreas: Negative. Spleen: Surgically absent with regenerative splenules in the left upper quadrant. Adrenals/Urinary Tract: Adrenal glands are unremarkable. 11 mm low-attenuation lesion in the right kidney is unchanged and likely a cyst  although too small to definitively characterize. Left renal stone. Kidneys are otherwise unremarkable. Ureters are decompressed. Bladder is grossly unremarkable. Stomach/Bowel: Stomach is unremarkable. Duodenal diverticulum. Small bowel, appendix and colon are otherwise unremarkable. Vascular/Lymphatic: Atherosclerotic calcification of the arterial vasculature without abdominal aortic aneurysm. No pathologically enlarged lymph nodes. Reproductive: Patient is reportedly status post trans urethral resection of the prostate. Other: No free fluid. Small sub xiphoid and supraumbilical midline ventral hernias contain fat. Mesenteries and peritoneum are otherwise unremarkable. Musculoskeletal: No worrisome lytic or sclerotic lesions. Degenerative changes are seen in the spine. IMPRESSION: 1. Interval response to therapy as evidenced by decrease in size of a right middle lobe nodule, with surrounding ground-glass and volume loss, as before. 2. Moderate pericardial effusion, stable. 3. Left renal stone. 4. Three-vessel coronary artery calcification. Electronically Signed   By: Lorin Picket M.D.   On: 10/11/2015 11:31    ASSESSMENT AND PLAN: This is a very pleasant 82 years old white male with a stage IIIB non-small cell lung cancer with negative EGFR mutation, negative ALK gene translocation and PDL 1 expression 10%. The patient is not eligible for concurrent chemoradiation because of the large radiotherapy field. The patient is currently undergoing systemic chemotherapy with carboplatin and Alimta status post 3 cycles. The recent CT scan of the chest, abdomen and pelvis showed improvement of his disease was decrease in the size of the right middle lobe lung nodule. I discussed the scan results with the patient and his family. I recommended for the patient to continue his current treatment with carboplatin and Alimta. He will proceed with cycle #4 today as a scheduled. He would come back for follow-up visit in 3  weeks for reevaluation before starting cycle #5.  He was advised to call immediately if he has any concerning symptoms in the interval. The patient voices understanding of current disease status and treatment options and is in agreement with the current care plan.  All questions were answered. The patient knows to call the clinic with any problems, questions or concerns. We can certainly see the patient much sooner if necessary.  Disclaimer: This note was dictated with voice recognition software. Similar sounding words can inadvertently be transcribed and may not be corrected upon review.

## 2015-10-13 NOTE — Telephone Encounter (Signed)
perp of to sch pt appt-sent MW email ot sch trmt-pt to get updated copy of avs b4 leaving trmt room

## 2015-10-13 NOTE — Telephone Encounter (Signed)
Per staff message and POF I have scheduled appts. Advised scheduler of appts. JMW  

## 2015-10-13 NOTE — Patient Instructions (Signed)
[facility] Discharge Instructions for Patients Receiving Chemotherapy  Today you received the following chemotherapy agents: Alimta/Carboplatin  To help prevent nausea and vomiting after your treatment, we encourage you to take your nausea medication as directed.   If you develop nausea and vomiting that is not controlled by your nausea medication, call the clinic.   BELOW ARE SYMPTOMS THAT SHOULD BE REPORTED IMMEDIATELY:  *FEVER GREATER THAN 100.5 F  *CHILLS WITH OR WITHOUT FEVER  NAUSEA AND VOMITING THAT IS NOT CONTROLLED WITH YOUR NAUSEA MEDICATION  *UNUSUAL SHORTNESS OF BREATH  *UNUSUAL BRUISING OR BLEEDING  TENDERNESS IN MOUTH AND THROAT WITH OR WITHOUT PRESENCE OF ULCERS  *URINARY PROBLEMS  *BOWEL PROBLEMS  UNUSUAL RASH Items with * indicate a potential emergency and should be followed up as soon as possible.  Feel free to call the clinic you have any questions or concerns. The clinic phone number is [phone].  Please show the CHEMO ALERT CARD at check-in to the Emergency Department and triage nurse.   

## 2015-10-18 ENCOUNTER — Telehealth: Payer: Self-pay | Admitting: Internal Medicine

## 2015-10-18 ENCOUNTER — Other Ambulatory Visit: Payer: Self-pay | Admitting: *Deleted

## 2015-10-18 ENCOUNTER — Ambulatory Visit (HOSPITAL_BASED_OUTPATIENT_CLINIC_OR_DEPARTMENT_OTHER): Payer: PPO

## 2015-10-18 ENCOUNTER — Other Ambulatory Visit (HOSPITAL_BASED_OUTPATIENT_CLINIC_OR_DEPARTMENT_OTHER): Payer: PPO

## 2015-10-18 ENCOUNTER — Telehealth: Payer: Self-pay | Admitting: *Deleted

## 2015-10-18 VITALS — BP 160/55 | HR 82 | Temp 97.8°F | Resp 18

## 2015-10-18 DIAGNOSIS — R918 Other nonspecific abnormal finding of lung field: Secondary | ICD-10-CM

## 2015-10-18 DIAGNOSIS — C342 Malignant neoplasm of middle lobe, bronchus or lung: Secondary | ICD-10-CM

## 2015-10-18 DIAGNOSIS — C3411 Malignant neoplasm of upper lobe, right bronchus or lung: Secondary | ICD-10-CM

## 2015-10-18 LAB — CBC WITH DIFFERENTIAL/PLATELET
BASO%: 0.3 % (ref 0.0–2.0)
Basophils Absolute: 0 10*3/uL (ref 0.0–0.1)
EOS ABS: 0.1 10*3/uL (ref 0.0–0.5)
EOS%: 1.4 % (ref 0.0–7.0)
HEMATOCRIT: 31.9 % — AB (ref 38.4–49.9)
HGB: 10.7 g/dL — ABNORMAL LOW (ref 13.0–17.1)
LYMPH#: 1.1 10*3/uL (ref 0.9–3.3)
LYMPH%: 17.2 % (ref 14.0–49.0)
MCH: 32.2 pg (ref 27.2–33.4)
MCHC: 33.7 g/dL (ref 32.0–36.0)
MCV: 95.7 fL (ref 79.3–98.0)
MONO#: 0.4 10*3/uL (ref 0.1–0.9)
MONO%: 6.3 % (ref 0.0–14.0)
NEUT#: 4.7 10*3/uL (ref 1.5–6.5)
NEUT%: 74.8 % (ref 39.0–75.0)
Platelets: 401 10*3/uL — ABNORMAL HIGH (ref 140–400)
RBC: 3.33 10*6/uL — AB (ref 4.20–5.82)
RDW: 16.5 % — ABNORMAL HIGH (ref 11.0–14.6)
WBC: 6.3 10*3/uL (ref 4.0–10.3)

## 2015-10-18 LAB — COMPREHENSIVE METABOLIC PANEL
ALBUMIN: 3.5 g/dL (ref 3.5–5.0)
ALK PHOS: 62 U/L (ref 40–150)
ALT: 34 U/L (ref 0–55)
AST: 35 U/L — ABNORMAL HIGH (ref 5–34)
Anion Gap: 9 mEq/L (ref 3–11)
BUN: 10.7 mg/dL (ref 7.0–26.0)
CHLORIDE: 94 meq/L — AB (ref 98–109)
CO2: 25 meq/L (ref 22–29)
Calcium: 9.8 mg/dL (ref 8.4–10.4)
Creatinine: 0.7 mg/dL (ref 0.7–1.3)
EGFR: 86 mL/min/{1.73_m2} — AB (ref >90)
GLUCOSE: 133 mg/dL (ref 70–140)
POTASSIUM: 4 meq/L (ref 3.5–5.1)
Sodium: 129 mEq/L — ABNORMAL LOW (ref 136–145)
Total Bilirubin: 1.34 mg/dL — ABNORMAL HIGH (ref 0.20–1.20)
Total Protein: 6.3 g/dL — ABNORMAL LOW (ref 6.4–8.3)

## 2015-10-18 MED ORDER — SODIUM CHLORIDE 0.9 % IV SOLN
1000.0000 mL | Freq: Once | INTRAVENOUS | Status: DC
  Administered 2015-10-18: 1000 mL via INTRAVENOUS

## 2015-10-18 MED ORDER — HEPARIN SOD (PORK) LOCK FLUSH 100 UNIT/ML IV SOLN
500.0000 [IU] | Freq: Once | INTRAVENOUS | Status: AC
  Administered 2015-10-18: 500 [IU] via INTRAVENOUS
  Filled 2015-10-18: qty 5

## 2015-10-18 MED ORDER — SODIUM CHLORIDE 0.9% FLUSH
10.0000 mL | INTRAVENOUS | Status: DC | PRN
  Filled 2015-10-18: qty 10

## 2015-10-18 NOTE — Telephone Encounter (Signed)
Pt c/o diarrhea 3 times today and 6 yesterday after taking sorbitol due to constipation. Pt feeling very fatigued, verbalized he is still able to eat and drink but very week, After reviewed with MD, pt will also have labs today. Cancel lab appt Wednesday per MD

## 2015-10-18 NOTE — Telephone Encounter (Signed)
s.w. pt and advised on todays appt.....pt ok and aware °

## 2015-10-20 ENCOUNTER — Other Ambulatory Visit: Payer: PPO

## 2015-10-25 ENCOUNTER — Telehealth: Payer: Self-pay

## 2015-10-25 ENCOUNTER — Telehealth: Payer: Self-pay | Admitting: Medical Oncology

## 2015-10-25 NOTE — Telephone Encounter (Signed)
F/u call. He had one more diarrhea stool at 1 pm and took another imodium. I told him to call back tomorrow around 10 am and let us know how he is doing.

## 2015-10-25 NOTE — Telephone Encounter (Signed)
Several episodes of brown watery diarrhea Sunday and 3 episodes again this am . He denies weakness and reports he is drinking a lot of water. He has not taken any imodium since last week. I instructed him to take  2 tablets of imodium now per package instructions and another one if he has another episode and I will call him back after lunch.

## 2015-10-25 NOTE — Telephone Encounter (Signed)
Patient called c/o diarrhea for the last 2 days, up to 6 "brown watery stools" yesterday.  He is requesting another medicine beside imodium that may help him with the diarrhea.  Arnoldo Lenis., RN was notified.  Waiting to hear back from RN for instructions.

## 2015-10-26 ENCOUNTER — Telehealth: Payer: Self-pay | Admitting: Medical Oncology

## 2015-10-26 NOTE — Telephone Encounter (Signed)
F/u call - had 2 more episodes of diarrhea during the night at 0100 and 0530 and again at 0830, then he took another imodium. This is day 3 of diarrhea. He feels fine -denies weakness , is 'eating and drinking and getting around". Onc tx request sent for [facility] to evaluate labs and diarrhea tomorrow

## 2015-10-27 ENCOUNTER — Ambulatory Visit (HOSPITAL_BASED_OUTPATIENT_CLINIC_OR_DEPARTMENT_OTHER): Payer: PPO | Admitting: Nurse Practitioner

## 2015-10-27 ENCOUNTER — Other Ambulatory Visit (HOSPITAL_BASED_OUTPATIENT_CLINIC_OR_DEPARTMENT_OTHER): Payer: PPO

## 2015-10-27 VITALS — BP 141/50 | HR 70 | Temp 97.8°F | Resp 18 | Ht 68.0 in | Wt 168.0 lb

## 2015-10-27 DIAGNOSIS — C342 Malignant neoplasm of middle lobe, bronchus or lung: Secondary | ICD-10-CM | POA: Diagnosis not present

## 2015-10-27 DIAGNOSIS — R197 Diarrhea, unspecified: Secondary | ICD-10-CM

## 2015-10-27 DIAGNOSIS — C3411 Malignant neoplasm of upper lobe, right bronchus or lung: Secondary | ICD-10-CM

## 2015-10-27 DIAGNOSIS — E871 Hypo-osmolality and hyponatremia: Secondary | ICD-10-CM | POA: Diagnosis not present

## 2015-10-27 LAB — COMPREHENSIVE METABOLIC PANEL
ALT: 52 U/L (ref 0–55)
ANION GAP: 5 meq/L (ref 3–11)
AST: 45 U/L — ABNORMAL HIGH (ref 5–34)
Albumin: 3.3 g/dL — ABNORMAL LOW (ref 3.5–5.0)
Alkaline Phosphatase: 99 U/L (ref 40–150)
BILIRUBIN TOTAL: 0.48 mg/dL (ref 0.20–1.20)
BUN: 8.1 mg/dL (ref 7.0–26.0)
CALCIUM: 9.1 mg/dL (ref 8.4–10.4)
CO2: 26 meq/L (ref 22–29)
CREATININE: 0.7 mg/dL (ref 0.7–1.3)
Chloride: 102 mEq/L (ref 98–109)
EGFR: 87 mL/min/{1.73_m2} — AB (ref >90)
Glucose: 104 mg/dl (ref 70–140)
Potassium: 4 mEq/L (ref 3.5–5.1)
Sodium: 133 mEq/L — ABNORMAL LOW (ref 136–145)
TOTAL PROTEIN: 6.2 g/dL — AB (ref 6.4–8.3)

## 2015-10-27 LAB — CBC WITH DIFFERENTIAL/PLATELET
BASO%: 0 % (ref 0.0–2.0)
Basophils Absolute: 0 10*3/uL (ref 0.0–0.1)
EOS ABS: 0 10*3/uL (ref 0.0–0.5)
EOS%: 0.5 % (ref 0.0–7.0)
HEMATOCRIT: 27.5 % — AB (ref 38.4–49.9)
HEMOGLOBIN: 9.4 g/dL — AB (ref 13.0–17.1)
LYMPH#: 1 10*3/uL (ref 0.9–3.3)
LYMPH%: 11.6 % — ABNORMAL LOW (ref 14.0–49.0)
MCH: 32.1 pg (ref 27.2–33.4)
MCHC: 34.2 g/dL (ref 32.0–36.0)
MCV: 93.9 fL (ref 79.3–98.0)
MONO#: 1.7 10*3/uL — AB (ref 0.1–0.9)
MONO%: 19.3 % — ABNORMAL HIGH (ref 0.0–14.0)
NEUT%: 68.6 % (ref 39.0–75.0)
NEUTROS ABS: 6 10*3/uL (ref 1.5–6.5)
Platelets: 154 10*3/uL (ref 140–400)
RBC: 2.93 10*6/uL — AB (ref 4.20–5.82)
RDW: 16.9 % — AB (ref 11.0–14.6)
WBC: 8.8 10*3/uL (ref 4.0–10.3)

## 2015-10-29 ENCOUNTER — Encounter: Payer: Self-pay | Admitting: Nurse Practitioner

## 2015-10-29 DIAGNOSIS — R197 Diarrhea, unspecified: Secondary | ICD-10-CM | POA: Insufficient documentation

## 2015-10-29 NOTE — Assessment & Plan Note (Signed)
Patient presented to the [facility] today with complaint of diarrhea.  He suffers with chronic constipation issue; and was prescribed sorbitol to try.  He states that he did hit his last dose of sorbitol 2 days ago; and now has had multiple episodes of diarrhea.  Advised patient that it was difficult to find a balance between constipation and diarrhea.  Patient was encouraged to hold any further sorbitol; and to wait a few days before beginning a regular bowel regimen.  Patient was asked if he would like to receive IV fluid rehydration today; but patient refused.  He felt that he could push fluids at home just as well.  Patient was advised to call/return to the emergency department for any worsening symptoms whatsoever.

## 2015-10-29 NOTE — Assessment & Plan Note (Signed)
Pt received cycle 4 of his carboplatin/alimta chemotherapy regimen on 10/13/2015.  He states he's been doing fairly well recently; with the exception of some issues with constipation.  He took some sorbitol; and now complains of diarrhea.  He denies any recent fevers or chills.  Blood counts obtained today were centrally within normal limits; with the exception of some mild anemia.  Patient is scheduled to return on 11/03/2015 for labs, visit, and chemotherapy.

## 2015-10-29 NOTE — Progress Notes (Signed)
SYMPTOM MANAGEMENT CLINIC   HPI: Sean Cooke 82 y.o. male diagnosed with lung cancer.  Currently undergoing carboplatin/Alimta chemotherapy regimen.   Patient presented to the [facility] today with complaint of diarrhea.  He suffers with chronic constipation issue; and was prescribed sorbitol to try.  He states that he did hit his last dose of sorbitol 2 days ago; and now has had multiple episodes of diarrhea.  Advised patient that it was difficult to find a balance between constipation and diarrhea.  Patient was encouraged to hold any further sorbitol; and to wait a few days before beginning a regular bowel regimen.  Patient was asked if he would like to receive IV fluid rehydration today; but patient refused.  He felt that he could push fluids at home just as well.  Patient was advised to call/return to the emergency department for any worsening symptoms whatsoever.  HPI  Review of Systems  Constitutional: Positive for malaise/fatigue. Negative for fever and chills.  Gastrointestinal: Positive for diarrhea and constipation.  All other systems reviewed and are negative.   Past Medical History  Diagnosis Date  . GERD (gastroesophageal reflux disease)   . Hiatal hernia   . Degenerative joint disease   . Rib fracture   . Diverticulosis   . Adenomatous colon polyp 05/2003  . Duodenal diverticulum   . Neuritis/radiculitis due to displacement of lumbar intervertebral disc   . Arthritis   . Pneumonia   . Hemorrhoids   . Encounter for antineoplastic chemotherapy 07/31/2015  . Lung cancer [facility])     Past Surgical History  Procedure Laterality Date  . Inguinal hernia repair    . Transurethral resection of prostate    . Splenectomy  2002  . Cataract extraction      bilateral  . Cholecystectomy  06/2003    has THROMBOCYTHEMIA; Normocytic anemia; GERD; SPONDYLOSIS, LUMBAR; NEURITIS, LUMBOSACRAL; OSTEOPENIA; ASPLENIA; URINARY FREQUENCY; COLONIC POLYPS, ADENOMATOUS, HX  OF; Constipation; Lactose intolerance; Abdominal pain, LLQ (left lower quadrant); Community acquired bacterial pneumonia; Leukocytosis; Essential hypertension; BPH (benign prostatic hyperplasia); Hyponatremia; Malignant neoplasm of right upper lobe of lung ([place]); Encounter for antineoplastic chemotherapy; and Diarrhea on his problem list.    is allergic to benzonatate; lyrica; penicillins; pneumococcal vaccine polyvalent; tizanidine; and tramadol.    Medication List       This list is accurate as of: 10/27/15 11:59 PM.  Always use your most recent med list.               acetaminophen 650 MG CR tablet  Commonly known as:  TYLENOL  Take 650 mg by mouth every 6 (six) hours as needed for pain or fever. Reported on 09/22/2015     ALIGN 4 MG Caps  Take 1 capsule by mouth daily.     alum & mag hydroxide-simeth 734-193-79 MG/5ML suspension  Commonly known as:  MAALOX PLUS  Take 5 mLs by mouth every 6 (six) hours as needed for indigestion. Reported on 08/20/2015     aspirin 81 MG tablet  Take 81 mg by mouth at bedtime.     CALCIUM + D PO  Take 1 capsule by mouth 2 (two) times daily.     clindamycin 150 MG capsule  Commonly known as:  CLEOCIN  Take 150 mg by mouth every 6 (six) hours. Reported on 03/17/2016     cromolyn 4 % ophthalmic solution  Commonly known as:  OPTICROM  Reported on 03/17/2016     dexamethasone 4 MG tablet  Commonly  known as:  DECADRON  4 mg by mouth twice a day the day before, day of and day after the chemotherapy every 3 weeks     dextromethorphan-guaiFENesin 10-100 MG/5ML liquid  Commonly known as:  ROBITUSSIN-DM  Take 10 mLs by mouth every 4 (four) hours as needed for cough. Reported on 10/27/2015     Fish Oil 1000 MG Caps  Take 1,000 mg by mouth daily.     FLOMAX 0.4 MG Caps capsule  Generic drug:  tamsulosin  Take 0.4 mg by mouth daily.     folic acid 1 MG tablet  Commonly known as:  FOLVITE  Take 1 tablet (1 mg total) by mouth daily.     guaiFENesin  600 MG 12 hr tablet  Commonly known as:  MUCINEX  Take 600 mg by mouth 2 (two) times daily as needed.     lactobacillus acidophilus & bulgar chewable tablet  Chew 1 tablet by mouth 2 (two) times daily. Store in [place], OTC     lidocaine-prilocaine cream  Commonly known as:  EMLA  Apply 1 application topically as needed.     loperamide 2 MG capsule  Commonly known as:  IMODIUM  Take 2 mg by mouth as needed for diarrhea or loose stools. Reported on 10/13/2015     loratadine 10 MG tablet  Commonly known as:  CLARITIN  Take 10 mg by mouth.     losartan 50 MG tablet  Commonly known as:  COZAAR  Take 50 mg by mouth daily.     meloxicam 7.5 MG tablet  Commonly known as:  MOBIC  Take 7.5 mg by mouth daily as needed for pain. Reported on 10/27/2015     multivitamin tablet  Take 1 tablet by mouth daily.     oxybutynin 5 MG tablet  Commonly known as:  DITROPAN  Take 5 mg by mouth daily.     pantoprazole 40 MG tablet  Commonly known as:  PROTONIX  Take 40 mg by mouth daily.     prochlorperazine 10 MG tablet  Commonly known as:  COMPAZINE  TAKE 1 TABLET (10 MG TOTAL) BY MOUTH EVERY 6 (SIX) HOURS AS NEEDED FOR NAUSEA OR VOMITING.     PROCTOSOL HC 2.5 % rectal cream  Generic drug:  hydrocortisone  Place 1 application rectally 2 (two) times daily as needed for hemorrhoids. Reported on 09/01/2015     PROLIA 60 MG/ML Soln injection  Generic drug:  denosumab  Inject 60 mg into the skin every 6 (six) months. Administer in upper arm, thigh, or abdomen     saw palmetto 160 MG capsule  Take 160 mg by mouth daily.     sorbitol 70 % solution  TAKE 30 MLS BY MOUTH DAILY AS NEEDED FOR MODERATE CONSTIPATION.         PHYSICAL EXAMINATION  Oncology Vitals 10/27/2015 10/18/2015  Height 173 cm -  Weight 76.204 kg -  Weight (lbs) 168 lbs -  BMI (kg/m2) 25.54 kg/m2 -  Temp 97.8 -  Pulse 70 82  Resp 18 18  SpO2 98 100  BSA (m2) 1.91 m2 -   BP Readings from Last 2 Encounters:  10/27/15  141/50  10/18/15 160/55    Physical Exam  Constitutional: He is oriented to person, place, and time and well-developed, well-nourished, and in no distress.  HENT:  Head: Normocephalic and atraumatic.  Eyes: Conjunctivae and EOM are normal. Pupils are equal, round, and reactive to light. Right eye exhibits no discharge. Left  eye exhibits no discharge. No scleral icterus.  Neck: Normal range of motion.  Pulmonary/Chest: Effort normal. No respiratory distress.  Musculoskeletal: Normal range of motion. He exhibits no edema or tenderness.  Neurological: He is alert and oriented to person, place, and time. Gait normal.  Skin: Skin is warm and dry.  Psychiatric: Affect normal.  Nursing note and vitals reviewed.   LABORATORY DATA:. Appointment on 10/27/2015  Component Date Value Ref Range Status  . WBC 10/27/2015 8.8  4.0 - 10.3 10e3/uL Final  . NEUT# 10/27/2015 6.0  1.5 - 6.5 10e3/uL Final  . HGB 10/27/2015 9.4* 13.0 - 17.1 g/dL Final  . HCT 10/27/2015 27.5* 38.4 - 49.9 % Final  . Platelets 10/27/2015 154  140 - 400 10e3/uL Final  . MCV 10/27/2015 93.9  79.3 - 98.0 fL Final  . MCH 10/27/2015 32.1  27.2 - 33.4 pg Final  . MCHC 10/27/2015 34.2  32.0 - 36.0 g/dL Final  . RBC 10/27/2015 2.93* 4.20 - 5.82 10e6/uL Final  . RDW 10/27/2015 16.9* 11.0 - 14.6 % Final  . lymph# 10/27/2015 1.0  0.9 - 3.3 10e3/uL Final  . MONO# 10/27/2015 1.7* 0.1 - 0.9 10e3/uL Final  . Eosinophils Absolute 10/27/2015 0.0  0.0 - 0.5 10e3/uL Final  . Basophils Absolute 10/27/2015 0.0  0.0 - 0.1 10e3/uL Final  . NEUT% 10/27/2015 68.6  39.0 - 75.0 % Final  . LYMPH% 10/27/2015 11.6* 14.0 - 49.0 % Final  . MONO% 10/27/2015 19.3* 0.0 - 14.0 % Final  . EOS% 10/27/2015 0.5  0.0 - 7.0 % Final  . BASO% 10/27/2015 0.0  0.0 - 2.0 % Final  . Sodium 10/27/2015 133* 136 - 145 mEq/L Final  . Potassium 10/27/2015 4.0  3.5 - 5.1 mEq/L Final  . Chloride 10/27/2015 102  98 - 109 mEq/L Final  . CO2 10/27/2015 26  22 - 29 mEq/L  Final  . Glucose 10/27/2015 104  70 - 140 mg/dl Final   Glucose reference range is for nonfasting patients. Fasting glucose reference range is 70- 100.  Marland Kitchen BUN 10/27/2015 8.1  7.0 - 26.0 mg/dL Final  . Creatinine 10/27/2015 0.7  0.7 - 1.3 mg/dL Final  . Total Bilirubin 10/27/2015 0.48  0.20 - 1.20 mg/dL Final  . Alkaline Phosphatase 10/27/2015 99  40 - 150 U/L Final  . AST 10/27/2015 45* 5 - 34 U/L Final  . ALT 10/27/2015 52  0 - 55 U/L Final  . Total Protein 10/27/2015 6.2* 6.4 - 8.3 g/dL Final  . Albumin 10/27/2015 3.3* 3.5 - 5.0 g/dL Final  . Calcium 10/27/2015 9.1  8.4 - 10.4 mg/dL Final  . Anion Gap 10/27/2015 5  3 - 11 mEq/L Final  . EGFR 10/27/2015 87* >90 ml/min/1.73 m2 Final   eGFR is calculated using the CKD-EPI Creatinine Equation (2009)     RADIOGRAPHIC STUDIES: No results found.  ASSESSMENT/PLAN:    Malignant neoplasm of right upper lobe of lung ([place]) Pt received cycle 4 of his carboplatin/alimta chemotherapy regimen on 10/13/2015.  He states he's been doing fairly well recently; with the exception of some issues with constipation.  He took some sorbitol; and now complains of diarrhea.  He denies any recent fevers or chills.  Blood counts obtained today were centrally within normal limits; with the exception of some mild anemia.  Patient is scheduled to return on 11/03/2015 for labs, visit, and chemotherapy.  Hyponatremia Patient noted to have sodium 133 today.  Patient appears to have a history of chronic  hyponatremia; with his last sodium prior to this visit at 129.  Patient was encouraged to push fluids is much as possible; and to try increasing his salt intake.  Diarrhea Patient presented to the [facility] today with complaint of diarrhea.  He suffers with chronic constipation issue; and was prescribed sorbitol to try.  He states that he did hit his last dose of sorbitol 2 days ago; and now has had multiple episodes of diarrhea.  Advised patient that it was  difficult to find a balance between constipation and diarrhea.  Patient was encouraged to hold any further sorbitol; and to wait a few days before beginning a regular bowel regimen.  Patient was asked if he would like to receive IV fluid rehydration today; but patient refused.  He felt that he could push fluids at home just as well.  Patient was advised to call/return to the emergency department for any worsening symptoms whatsoever.  Patient stated understanding of all instructions; and was in agreement with this plan of care. The patient knows to call the clinic with any problems, questions or concerns.   Review/collaboration with Dr. Julien Nordmann regarding all aspects of patient's visit today.   Total time spent with patient was 25 minutes;  with greater than 75 percent of that time spent in face to face counseling regarding patient's symptoms,  and coordination of care and follow up.  Disclaimer:This dictation was prepared with Dragon/digital dictation along with [employer]. Any transcriptional errors that result from this process are unintentional.  Sean Second, NP 10/29/2015

## 2015-10-29 NOTE — Assessment & Plan Note (Signed)
Patient noted to have sodium 133 today.  Patient appears to have a history of chronic hyponatremia; with his last sodium prior to this visit at 129.  Patient was encouraged to push fluids is much as possible; and to try increasing his salt intake.

## 2015-11-02 ENCOUNTER — Telehealth: Payer: Self-pay | Admitting: *Deleted

## 2015-11-02 NOTE — Telephone Encounter (Signed)
TC to pt to check status following [facility] visit 3/15- lm for rtn call.

## 2015-11-03 ENCOUNTER — Ambulatory Visit (HOSPITAL_BASED_OUTPATIENT_CLINIC_OR_DEPARTMENT_OTHER): Payer: PPO

## 2015-11-03 ENCOUNTER — Ambulatory Visit (HOSPITAL_BASED_OUTPATIENT_CLINIC_OR_DEPARTMENT_OTHER): Payer: PPO | Admitting: Internal Medicine

## 2015-11-03 ENCOUNTER — Encounter: Payer: Self-pay | Admitting: Internal Medicine

## 2015-11-03 ENCOUNTER — Telehealth: Payer: Self-pay | Admitting: Internal Medicine

## 2015-11-03 VITALS — BP 146/71 | HR 73 | Temp 98.3°F | Resp 18 | Ht 68.0 in | Wt 167.4 lb

## 2015-11-03 DIAGNOSIS — C342 Malignant neoplasm of middle lobe, bronchus or lung: Secondary | ICD-10-CM

## 2015-11-03 DIAGNOSIS — Z5111 Encounter for antineoplastic chemotherapy: Secondary | ICD-10-CM | POA: Diagnosis not present

## 2015-11-03 DIAGNOSIS — C3411 Malignant neoplasm of upper lobe, right bronchus or lung: Secondary | ICD-10-CM

## 2015-11-03 LAB — CBC WITH DIFFERENTIAL/PLATELET
BASO%: 0.2 % (ref 0.0–2.0)
Basophils Absolute: 0 10*3/uL (ref 0.0–0.1)
EOS%: 0 % (ref 0.0–7.0)
Eosinophils Absolute: 0 10*3/uL (ref 0.0–0.5)
HCT: 29.7 % — ABNORMAL LOW (ref 38.4–49.9)
HGB: 10.1 g/dL — ABNORMAL LOW (ref 13.0–17.1)
LYMPH#: 1.1 10*3/uL (ref 0.9–3.3)
LYMPH%: 11.9 % — AB (ref 14.0–49.0)
MCH: 33.1 pg (ref 27.2–33.4)
MCHC: 33.9 g/dL (ref 32.0–36.0)
MCV: 97.5 fL (ref 79.3–98.0)
MONO#: 1.7 10*3/uL — ABNORMAL HIGH (ref 0.1–0.9)
MONO%: 18.1 % — AB (ref 0.0–14.0)
NEUT#: 6.6 10*3/uL — ABNORMAL HIGH (ref 1.5–6.5)
NEUT%: 69.8 % (ref 39.0–75.0)
Platelets: 482 10*3/uL — ABNORMAL HIGH (ref 140–400)
RBC: 3.04 10*6/uL — AB (ref 4.20–5.82)
RDW: 18.1 % — ABNORMAL HIGH (ref 11.0–14.6)
WBC: 9.4 10*3/uL (ref 4.0–10.3)

## 2015-11-03 LAB — COMPREHENSIVE METABOLIC PANEL
ALBUMIN: 3.4 g/dL — AB (ref 3.5–5.0)
ALK PHOS: 86 U/L (ref 40–150)
ALT: 30 U/L (ref 0–55)
AST: 32 U/L (ref 5–34)
Anion Gap: 9 mEq/L (ref 3–11)
BUN: 9.9 mg/dL (ref 7.0–26.0)
CO2: 25 mEq/L (ref 22–29)
CREATININE: 0.7 mg/dL (ref 0.7–1.3)
Calcium: 9.8 mg/dL (ref 8.4–10.4)
Chloride: 99 mEq/L (ref 98–109)
EGFR: 83 mL/min/{1.73_m2} — ABNORMAL LOW (ref >90)
GLUCOSE: 176 mg/dL — AB (ref 70–140)
POTASSIUM: 4.1 meq/L (ref 3.5–5.1)
SODIUM: 133 meq/L — AB (ref 136–145)
Total Bilirubin: 0.31 mg/dL (ref 0.20–1.20)
Total Protein: 6.7 g/dL (ref 6.4–8.3)

## 2015-11-03 MED ORDER — SODIUM CHLORIDE 0.9 % IJ SOLN
10.0000 mL | INTRAMUSCULAR | Status: DC | PRN
  Administered 2015-11-03: 10 mL
  Filled 2015-11-03: qty 10

## 2015-11-03 MED ORDER — SODIUM CHLORIDE 0.9 % IV SOLN
Freq: Once | INTRAVENOUS | Status: AC
  Administered 2015-11-03: 10:00:00 via INTRAVENOUS

## 2015-11-03 MED ORDER — HEPARIN SOD (PORK) LOCK FLUSH 100 UNIT/ML IV SOLN
500.0000 [IU] | Freq: Once | INTRAVENOUS | Status: AC | PRN
  Administered 2015-11-03: 500 [IU]
  Filled 2015-11-03: qty 5

## 2015-11-03 MED ORDER — SODIUM CHLORIDE 0.9 % IV SOLN
410.0000 mg | Freq: Once | INTRAVENOUS | Status: AC
  Administered 2015-11-03: 410 mg via INTRAVENOUS
  Filled 2015-11-03: qty 41

## 2015-11-03 MED ORDER — SODIUM CHLORIDE 0.9 % IV SOLN
Freq: Once | INTRAVENOUS | Status: AC
  Administered 2015-11-03: 10:00:00 via INTRAVENOUS
  Filled 2015-11-03: qty 8

## 2015-11-03 MED ORDER — SODIUM CHLORIDE 0.9 % IV SOLN
500.0000 mg/m2 | Freq: Once | INTRAVENOUS | Status: AC
  Administered 2015-11-03: 950 mg via INTRAVENOUS
  Filled 2015-11-03: qty 6

## 2015-11-03 NOTE — Patient Instructions (Signed)
[facility] Discharge Instructions for Patients Receiving Chemotherapy  Today you received the following chemotherapy agents :  Carboplatin.  To help prevent nausea and vomiting after your treatment, we encourage you to take your nausea medication as prescribed.   If you develop nausea and vomiting that is not controlled by your nausea medication, call the clinic.   BELOW ARE SYMPTOMS THAT SHOULD BE REPORTED IMMEDIATELY:  *FEVER GREATER THAN 100.5 F  *CHILLS WITH OR WITHOUT FEVER  NAUSEA AND VOMITING THAT IS NOT CONTROLLED WITH YOUR NAUSEA MEDICATION  *UNUSUAL SHORTNESS OF BREATH  *UNUSUAL BRUISING OR BLEEDING  TENDERNESS IN MOUTH AND THROAT WITH OR WITHOUT PRESENCE OF ULCERS  *URINARY PROBLEMS  *BOWEL PROBLEMS  UNUSUAL RASH Items with * indicate a potential emergency and should be followed up as soon as possible.  Feel free to call the clinic you have any questions or concerns. The clinic phone number is [phone].  Please show the CHEMO ALERT CARD at check-in to the Emergency Department and triage nurse.   

## 2015-11-03 NOTE — Progress Notes (Signed)
[place] [phone]   [phone]  OFFICE PROGRESS NOTE  [name], MD [id number] [employer] [place] [address]  DIAGNOSIS: stage IIIB (T1a, N3, M0) non-small cell lung cancer, adenocarcinoma with negative EGFR mutation, negative ALKgene translocation, negative ROS 1, but positive RET diagnosed in November 2016 and presented with right middle lobe lung nodule in addition to mediastinal and left supraclavicular lymphadenopathy.  PRIOR THERAPY: None  CURRENT THERAPY: Systemic chemotherapy with carboplatin for AUC of 5 and Alimta 500 MG/M2 every 3 weeks, first dose 08/11/2015. Status post 4 cycles.  INTERVAL HISTORY: Sean Cooke 82 y.o. male returns to the clinic today for follow-up visit accompanied by his wife. The patient is currently undergoing systemic chemotherapy with carboplatin and Alimta status post 4 cycles. He tolerated the last cycle of his treatment well except for mild prolonged fatigue after the chemotherapy. The patient is feeling much better today. He denied having any significant fever or chills. He has no nausea or vomiting. He denied having any significant chest pain, shortness breath, cough or hemoptysis. He denied having any significant weight loss or night sweats.  MEDICAL HISTORY: Past Medical History  Diagnosis Date  . GERD (gastroesophageal reflux disease)   . Hiatal hernia   . Degenerative joint disease   . Rib fracture   . Diverticulosis   . Adenomatous colon polyp 05/2003  . Duodenal diverticulum   . Neuritis/radiculitis due to displacement of lumbar intervertebral disc   . Arthritis   . Pneumonia   . Hemorrhoids   . Encounter for antineoplastic chemotherapy 07/31/2015  . Lung cancer ([place])     ALLERGIES:  is allergic to benzonatate; lyrica; penicillins; pneumococcal vaccine polyvalent; tizanidine; and tramadol.  MEDICATIONS:  Current Outpatient Prescriptions    Medication Sig Dispense Refill  . acetaminophen (TYLENOL) 650 MG CR tablet Take 650 mg by mouth every 6 (six) hours as needed for pain or fever. Reported on 09/22/2015    . alum & mag hydroxide-simeth (MAALOX PLUS) 400-400-40 MG/5ML suspension Take 5 mLs by mouth every 6 (six) hours as needed for indigestion. Reported on 08/20/2015    . aspirin 81 MG tablet Take 81 mg by mouth at bedtime.     . Calcium Carbonate-Vitamin D (CALCIUM + D PO) Take 1 capsule by mouth 2 (two) times daily.     . clindamycin (CLEOCIN) 150 MG capsule Take 150 mg by mouth every 6 (six) hours. Reported on 10/27/2015  0  . cromolyn (OPTICROM) 4 % ophthalmic solution Reported on 10/27/2015    . denosumab (PROLIA) 60 MG/ML SOLN injection Inject 60 mg into the skin every 6 (six) months. Administer in upper arm, thigh, or abdomen    . dexamethasone (DECADRON) 4 MG tablet 4 mg by mouth twice a day the day before, day of and day after the chemotherapy every 3 weeks 40 tablet 1  . dextromethorphan-guaiFENesin (ROBITUSSIN-DM) 10-100 MG/5ML liquid Take 10 mLs by mouth every 4 (four) hours as needed for cough. Reported on 4/65/0354    . folic acid (FOLVITE) 1 MG tablet Take 1 tablet (1 mg total) by mouth daily. 30 tablet 4  . guaiFENesin (MUCINEX) 600 MG 12 hr tablet Take 600 mg by mouth 2 (two) times daily as needed.    . hydrocortisone (PROCTOSOL HC) 2.5 % rectal cream Place 1 application rectally 2 (two) times daily as needed for hemorrhoids. Reported on 09/01/2015    . lactobacillus acidophilus & bulgar (LACTINEX)  chewable tablet Chew 1 tablet by mouth 2 (two) times daily. Store in [place], [place]    . lidocaine-prilocaine (EMLA) cream Apply 1 application topically as needed. 30 g 3  . loperamide (IMODIUM) 2 MG capsule Take 2 mg by mouth as needed for diarrhea or loose stools. Reported on 10/13/2015    . loratadine (CLARITIN) 10 MG tablet Take 10 mg by mouth.    . losartan (COZAAR) 50 MG tablet Take 50 mg by mouth daily.    . meloxicam (MOBIC)  7.5 MG tablet Take 7.5 mg by mouth daily as needed for pain. Reported on December 03, 202017    . Multiple Vitamin (MULTIVITAMIN) tablet Take 1 tablet by mouth daily.    . Omega-3 Fatty Acids (FISH OIL) 1000 MG CAPS Take 1,000 mg by mouth daily.     Marland Kitchen oxybutynin (DITROPAN) 5 MG tablet Take 5 mg by mouth daily.    . pantoprazole (PROTONIX) 40 MG tablet Take 40 mg by mouth daily.    . Probiotic Product (ALIGN) 4 MG CAPS Take 1 capsule by mouth daily.    . prochlorperazine (COMPAZINE) 10 MG tablet TAKE 1 TABLET (10 MG TOTAL) BY MOUTH EVERY 6 (SIX) HOURS AS NEEDED FOR NAUSEA OR VOMITING. (Patient not taking: Reported on December 03, 202017) 30 tablet 0  . saw palmetto 160 MG capsule Take 160 mg by mouth daily.    . sorbitol 70 % solution TAKE 30 MLS BY MOUTH DAILY AS NEEDED FOR MODERATE CONSTIPATION. 473 mL 0  . Tamsulosin HCl (FLOMAX) 0.4 MG CAPS Take 0.4 mg by mouth daily.     No current facility-administered medications for this visit.    SURGICAL HISTORY:  Past Surgical History  Procedure Laterality Date  . Inguinal hernia repair    . Transurethral resection of prostate    . Splenectomy  2002  . Cataract extraction      bilateral  . Cholecystectomy  06/2003    REVIEW OF SYSTEMS:  A comprehensive review of systems was negative except for: Constitutional: positive for fatigue   PHYSICAL EXAMINATION: General appearance: alert, cooperative, fatigued and no distress Head: Normocephalic, without obvious abnormality, atraumatic Neck: no adenopathy, no JVD, supple, symmetrical, trachea midline and thyroid not enlarged, symmetric, no tenderness/mass/nodules Lymph nodes: Cervical, supraclavicular, and axillary nodes normal. Resp: clear to auscultation bilaterally Back: symmetric, no curvature. ROM normal. No CVA tenderness. Cardio: regular rate and rhythm, S1, S2 normal, no murmur, click, rub or gallop GI: soft, non-tender; bowel sounds normal; no masses,  no organomegaly Extremities: extremities normal,  atraumatic, no cyanosis or edema Neurologic: Alert and oriented X 3, normal strength and tone. Normal symmetric reflexes. Normal coordination and gait  ECOG PERFORMANCE STATUS: 1 - Symptomatic but completely ambulatory  Blood pressure 146/71, pulse 73, temperature 98.3 F (36.8 C), temperature source Oral, resp. rate 18, height 5' 8"  (1.727 m), weight 167 lb 6.4 oz (75.932 kg), SpO2 98 %.  LABORATORY DATA: Lab Results  Component Value Date   WBC 9.4 11/03/2015   HGB 10.1* 11/03/2015   HCT 29.7* 11/03/2015   MCV 97.5 11/03/2015   PLT 482* 11/03/2015      Chemistry      Component Value Date/Time   NA 133* 0December 03, 202017 0933   NA 133* 08/30/2015 1150   K 4.0 0December 03, 202017 0933   K 4.1 08/30/2015 1150   CL 100* 08/30/2015 1150   CO2 26 0December 03, 202017 0933   CO2 24 08/30/2015 1150   BUN 8.1 0December 03, 202017 0933   BUN 8 08/30/2015 1150  CREATININE 0.7 02/07/2016 0933   CREATININE 0.55* 08/30/2015 1150      Component Value Date/Time   CALCIUM 9.1 02/07/2016 0933   CALCIUM 9.8 08/30/2015 1150   ALKPHOS 99 02/07/2016 0933   ALKPHOS 62 08/30/2015 1150   AST 45* 02/07/2016 0933   AST 28 08/30/2015 1150   ALT 52 02/07/2016 0933   ALT 25 08/30/2015 1150   BILITOT 0.48 02/07/2016 0933   BILITOT 0.8 08/30/2015 1150       RADIOGRAPHIC STUDIES: Ct Chest W Contrast  10/11/2015  CLINICAL DATA:  Lung cancer, chemotherapy in progress. EXAM: CT CHEST, ABDOMEN, AND PELVIS WITH CONTRAST TECHNIQUE: Multidetector CT imaging of the chest, abdomen and pelvis was performed following the standard protocol during bolus administration of intravenous contrast. CONTRAST:  130m OMNIPAQUE IOHEXOL 300 MG/ML  SOLN COMPARISON:  CT abdomen pelvis 08/14/2015, PET 06/09/2015 and CT chest 05/27/2015. FINDINGS: CT CHEST FINDINGS Mediastinum/Lymph Nodes: Right IJ Port-A-Cath terminates in the low SVC. Mediastinal lymph nodes are not enlarged by CT size criteria. Right hilar lymph node measures 10 mm, stable. No left hilar  or axillary adenopathy. Three-vessel coronary artery calcification. Heart size normal. Moderate pericardial effusion, stable. Lungs/Pleura: A nodule in the lateral segment right middle lobe measures 8 x 9 mm (series 4, image 33), previously 11 x 17 mm. Surrounding ground-glass and volume loss, similar. Left lung is clear. No pleural fluid. Airway is unremarkable. Musculoskeletal: No worrisome lytic or sclerotic lesions. CT ABDOMEN PELVIS FINDINGS Hepatobiliary: Low-attenuation lesion in the dome of the liver measures 10 mm, stable and too small to characterize. Cholecystectomy. No biliary ductal dilatation. Pancreas: Negative. Spleen: Surgically absent with regenerative splenules in the left upper quadrant. Adrenals/Urinary Tract: Adrenal glands are unremarkable. 11 mm low-attenuation lesion in the right kidney is unchanged and likely a cyst although too small to definitively characterize. Left renal stone. Kidneys are otherwise unremarkable. Ureters are decompressed. Bladder is grossly unremarkable. Stomach/Bowel: Stomach is unremarkable. Duodenal diverticulum. Small bowel, appendix and colon are otherwise unremarkable. Vascular/Lymphatic: Atherosclerotic calcification of the arterial vasculature without abdominal aortic aneurysm. No pathologically enlarged lymph nodes. Reproductive: Patient is reportedly status post trans urethral resection of the prostate. Other: No free fluid. Small sub xiphoid and supraumbilical midline ventral hernias contain fat. Mesenteries and peritoneum are otherwise unremarkable. Musculoskeletal: No worrisome lytic or sclerotic lesions. Degenerative changes are seen in the spine. IMPRESSION: 1. Interval response to therapy as evidenced by decrease in size of a right middle lobe nodule, with surrounding ground-glass and volume loss, as before. 2. Moderate pericardial effusion, stable. 3. Left renal stone. 4. Three-vessel coronary artery calcification. Electronically Signed   By: MLorin PicketM.D.   On: 10/11/2015 11:31   Ct Abdomen Pelvis W Contrast  10/11/2015  CLINICAL DATA:  Lung cancer, chemotherapy in progress. EXAM: CT CHEST, ABDOMEN, AND PELVIS WITH CONTRAST TECHNIQUE: Multidetector CT imaging of the chest, abdomen and pelvis was performed following the standard protocol during bolus administration of intravenous contrast. CONTRAST:  1042mOMNIPAQUE IOHEXOL 300 MG/ML  SOLN COMPARISON:  CT abdomen pelvis 08/14/2015, PET 06/09/2015 and CT chest 05/27/2015. FINDINGS: CT CHEST FINDINGS Mediastinum/Lymph Nodes: Right IJ Port-A-Cath terminates in the low SVC. Mediastinal lymph nodes are not enlarged by CT size criteria. Right hilar lymph node measures 10 mm, stable. No left hilar or axillary adenopathy. Three-vessel coronary artery calcification. Heart size normal. Moderate pericardial effusion, stable. Lungs/Pleura: A nodule in the lateral segment right middle lobe measures 8 x 9 mm (series 4, image 33), previously 11 x  17 mm. Surrounding ground-glass and volume loss, similar. Left lung is clear. No pleural fluid. Airway is unremarkable. Musculoskeletal: No worrisome lytic or sclerotic lesions. CT ABDOMEN PELVIS FINDINGS Hepatobiliary: Low-attenuation lesion in the dome of the liver measures 10 mm, stable and too small to characterize. Cholecystectomy. No biliary ductal dilatation. Pancreas: Negative. Spleen: Surgically absent with regenerative splenules in the left upper quadrant. Adrenals/Urinary Tract: Adrenal glands are unremarkable. 11 mm low-attenuation lesion in the right kidney is unchanged and likely a cyst although too small to definitively characterize. Left renal stone. Kidneys are otherwise unremarkable. Ureters are decompressed. Bladder is grossly unremarkable. Stomach/Bowel: Stomach is unremarkable. Duodenal diverticulum. Small bowel, appendix and colon are otherwise unremarkable. Vascular/Lymphatic: Atherosclerotic calcification of the arterial vasculature without abdominal  aortic aneurysm. No pathologically enlarged lymph nodes. Reproductive: Patient is reportedly status post trans urethral resection of the prostate. Other: No free fluid. Small sub xiphoid and supraumbilical midline ventral hernias contain fat. Mesenteries and peritoneum are otherwise unremarkable. Musculoskeletal: No worrisome lytic or sclerotic lesions. Degenerative changes are seen in the spine. IMPRESSION: 1. Interval response to therapy as evidenced by decrease in size of a right middle lobe nodule, with surrounding ground-glass and volume loss, as before. 2. Moderate pericardial effusion, stable. 3. Left renal stone. 4. Three-vessel coronary artery calcification. Electronically Signed   By: Lorin Picket M.D.   On: 10/11/2015 11:31    ASSESSMENT AND PLAN: This is a very pleasant 82 years old white male with a stage IIIB non-small cell lung cancer with negative EGFR mutation, negative ALK gene translocation and PDL 1 expression 10%. The patient is not eligible for concurrent chemoradiation because of the large radiotherapy field. The patient is currently undergoing systemic chemotherapy with carboplatin and Alimta status post 4 cycles. He is rating his treatment well except for fatigue. I recommended for the patient to continue his current treatment with carboplatin and Alimta. He will proceed with cycle #5 today as a scheduled. He would come back for follow-up visit in 3 weeks for reevaluation before starting cycle #6.  He was advised to call immediately if he has any concerning symptoms in the interval. The patient voices understanding of current disease status and treatment options and is in agreement with the current care plan.  All questions were answered. The patient knows to call the clinic with any problems, questions or concerns. We can certainly see the patient much sooner if necessary.  Disclaimer: This note was dictated with voice recognition software. Similar sounding words can  inadvertently be transcribed and may not be corrected upon review.

## 2015-11-03 NOTE — Telephone Encounter (Signed)
Patient already on schedule for weekly labs and next tx cycle with f/u on 4/12 per 3/22 pof. No additional cycles in care plan beyond 4/12. Patient given avs report and appointments for March and April.

## 2015-11-03 NOTE — Patient Instructions (Signed)
[facility] Discharge Instructions for Patients Receiving Chemotherapy  Today you received the following chemotherapy agents: Alimta and Carboplatin.  To help prevent nausea and vomiting after your treatment, we encourage you to take your nausea medication as directed.   If you develop nausea and vomiting that is not controlled by your nausea medication, call the clinic.   BELOW ARE SYMPTOMS THAT SHOULD BE REPORTED IMMEDIATELY:  *FEVER GREATER THAN 100.5 F  *CHILLS WITH OR WITHOUT FEVER  NAUSEA AND VOMITING THAT IS NOT CONTROLLED WITH YOUR NAUSEA MEDICATION  *UNUSUAL SHORTNESS OF BREATH  *UNUSUAL BRUISING OR BLEEDING  TENDERNESS IN MOUTH AND THROAT WITH OR WITHOUT PRESENCE OF ULCERS  *URINARY PROBLEMS  *BOWEL PROBLEMS  UNUSUAL RASH Items with * indicate a potential emergency and should be followed up as soon as possible.  Feel free to call the clinic you have any questions or concerns. The clinic phone number is [phone].  Please show the CHEMO ALERT CARD at check-in to the Emergency Department and triage nurse.   

## 2015-11-05 ENCOUNTER — Telehealth: Payer: Self-pay

## 2015-11-05 NOTE — Telephone Encounter (Signed)
Pt called stating he was constipated after treatment. He finally had hard BM this am after miralax and softener. He was asking if he should take more softener or miralax. Instructed to take another softener, if he has another bm he will not need more miralax, if he does not have another bm to go ahead with the miralax. Also instructed him to drink plenty of water.

## 2015-11-10 ENCOUNTER — Other Ambulatory Visit (HOSPITAL_BASED_OUTPATIENT_CLINIC_OR_DEPARTMENT_OTHER): Payer: PPO

## 2015-11-10 DIAGNOSIS — C342 Malignant neoplasm of middle lobe, bronchus or lung: Secondary | ICD-10-CM

## 2015-11-10 DIAGNOSIS — C3411 Malignant neoplasm of upper lobe, right bronchus or lung: Secondary | ICD-10-CM

## 2015-11-10 LAB — COMPREHENSIVE METABOLIC PANEL
ALBUMIN: 3.3 g/dL — AB (ref 3.5–5.0)
ALK PHOS: 64 U/L (ref 40–150)
ALT: 35 U/L (ref 0–55)
ANION GAP: 6 meq/L (ref 3–11)
AST: 35 U/L — AB (ref 5–34)
BILIRUBIN TOTAL: 0.41 mg/dL (ref 0.20–1.20)
BUN: 12.3 mg/dL (ref 7.0–26.0)
CALCIUM: 9.7 mg/dL (ref 8.4–10.4)
CO2: 28 mEq/L (ref 22–29)
CREATININE: 0.7 mg/dL (ref 0.7–1.3)
Chloride: 97 mEq/L — ABNORMAL LOW (ref 98–109)
EGFR: 87 mL/min/{1.73_m2} — AB (ref >90)
GLUCOSE: 122 mg/dL (ref 70–140)
Potassium: 4.2 mEq/L (ref 3.5–5.1)
SODIUM: 131 meq/L — AB (ref 136–145)
Total Protein: 6 g/dL — ABNORMAL LOW (ref 6.4–8.3)

## 2015-11-10 LAB — CBC WITH DIFFERENTIAL/PLATELET
BASO%: 0.3 % (ref 0.0–2.0)
BASOS ABS: 0 10*3/uL (ref 0.0–0.1)
EOS%: 1.8 % (ref 0.0–7.0)
Eosinophils Absolute: 0.1 10*3/uL (ref 0.0–0.5)
HEMATOCRIT: 28.9 % — AB (ref 38.4–49.9)
HEMOGLOBIN: 9.9 g/dL — AB (ref 13.0–17.1)
LYMPH#: 1.2 10*3/uL (ref 0.9–3.3)
LYMPH%: 30.9 % (ref 14.0–49.0)
MCH: 32.7 pg (ref 27.2–33.4)
MCHC: 34.3 g/dL (ref 32.0–36.0)
MCV: 95.4 fL (ref 79.3–98.0)
MONO#: 0.5 10*3/uL (ref 0.1–0.9)
MONO%: 11.8 % (ref 0.0–14.0)
NEUT#: 2.1 10*3/uL (ref 1.5–6.5)
NEUT%: 55.2 % (ref 39.0–75.0)
Platelets: 266 10*3/uL (ref 140–400)
RBC: 3.03 10*6/uL — ABNORMAL LOW (ref 4.20–5.82)
RDW: 16.8 % — AB (ref 11.0–14.6)
WBC: 3.8 10*3/uL — AB (ref 4.0–10.3)

## 2015-11-17 ENCOUNTER — Other Ambulatory Visit (HOSPITAL_BASED_OUTPATIENT_CLINIC_OR_DEPARTMENT_OTHER): Payer: PPO

## 2015-11-17 DIAGNOSIS — C3411 Malignant neoplasm of upper lobe, right bronchus or lung: Secondary | ICD-10-CM

## 2015-11-17 DIAGNOSIS — C342 Malignant neoplasm of middle lobe, bronchus or lung: Secondary | ICD-10-CM

## 2015-11-17 LAB — COMPREHENSIVE METABOLIC PANEL
ALT: 25 U/L (ref 0–55)
AST: 34 U/L (ref 5–34)
Albumin: 3.4 g/dL — ABNORMAL LOW (ref 3.5–5.0)
Alkaline Phosphatase: 72 U/L (ref 40–150)
Anion Gap: 6 mEq/L (ref 3–11)
BUN: 8.1 mg/dL (ref 7.0–26.0)
CALCIUM: 9.8 mg/dL (ref 8.4–10.4)
CHLORIDE: 98 meq/L (ref 98–109)
CO2: 27 mEq/L (ref 22–29)
CREATININE: 0.7 mg/dL (ref 0.7–1.3)
EGFR: 88 mL/min/{1.73_m2} — ABNORMAL LOW (ref >90)
Glucose: 88 mg/dl (ref 70–140)
POTASSIUM: 4.1 meq/L (ref 3.5–5.1)
Sodium: 131 mEq/L — ABNORMAL LOW (ref 136–145)
Total Bilirubin: 0.53 mg/dL (ref 0.20–1.20)
Total Protein: 6.2 g/dL — ABNORMAL LOW (ref 6.4–8.3)

## 2015-11-17 LAB — CBC WITH DIFFERENTIAL/PLATELET
BASO%: 0 % (ref 0.0–2.0)
BASOS ABS: 0 10*3/uL (ref 0.0–0.1)
EOS%: 0.2 % (ref 0.0–7.0)
Eosinophils Absolute: 0 10*3/uL (ref 0.0–0.5)
HEMATOCRIT: 27.5 % — AB (ref 38.4–49.9)
HGB: 9.7 g/dL — ABNORMAL LOW (ref 13.0–17.1)
LYMPH#: 1.1 10*3/uL (ref 0.9–3.3)
LYMPH%: 13.2 % — ABNORMAL LOW (ref 14.0–49.0)
MCH: 33.3 pg (ref 27.2–33.4)
MCHC: 35.3 g/dL (ref 32.0–36.0)
MCV: 94.5 fL (ref 79.3–98.0)
MONO#: 2 10*3/uL — AB (ref 0.1–0.9)
MONO%: 23.4 % — ABNORMAL HIGH (ref 0.0–14.0)
NEUT#: 5.3 10*3/uL (ref 1.5–6.5)
NEUT%: 63.2 % (ref 39.0–75.0)
PLATELETS: 144 10*3/uL (ref 140–400)
RBC: 2.91 10*6/uL — ABNORMAL LOW (ref 4.20–5.82)
RDW: 16.9 % — ABNORMAL HIGH (ref 11.0–14.6)
WBC: 8.4 10*3/uL (ref 4.0–10.3)

## 2015-11-24 ENCOUNTER — Ambulatory Visit (HOSPITAL_BASED_OUTPATIENT_CLINIC_OR_DEPARTMENT_OTHER): Payer: PPO | Admitting: Internal Medicine

## 2015-11-24 ENCOUNTER — Other Ambulatory Visit (HOSPITAL_BASED_OUTPATIENT_CLINIC_OR_DEPARTMENT_OTHER): Payer: PPO

## 2015-11-24 ENCOUNTER — Encounter: Payer: Self-pay | Admitting: Internal Medicine

## 2015-11-24 ENCOUNTER — Ambulatory Visit (HOSPITAL_BASED_OUTPATIENT_CLINIC_OR_DEPARTMENT_OTHER): Payer: PPO

## 2015-11-24 ENCOUNTER — Telehealth: Payer: Self-pay | Admitting: Internal Medicine

## 2015-11-24 VITALS — BP 160/63 | HR 62 | Temp 98.6°F | Resp 18 | Ht 68.0 in | Wt 165.8 lb

## 2015-11-24 DIAGNOSIS — C3411 Malignant neoplasm of upper lobe, right bronchus or lung: Secondary | ICD-10-CM

## 2015-11-24 DIAGNOSIS — Z5111 Encounter for antineoplastic chemotherapy: Secondary | ICD-10-CM | POA: Diagnosis not present

## 2015-11-24 DIAGNOSIS — C342 Malignant neoplasm of middle lobe, bronchus or lung: Secondary | ICD-10-CM

## 2015-11-24 DIAGNOSIS — E871 Hypo-osmolality and hyponatremia: Secondary | ICD-10-CM

## 2015-11-24 LAB — COMPREHENSIVE METABOLIC PANEL
ALK PHOS: 95 U/L (ref 40–150)
ALT: 20 U/L (ref 0–55)
AST: 32 U/L (ref 5–34)
Albumin: 3.4 g/dL — ABNORMAL LOW (ref 3.5–5.0)
Anion Gap: 5 mEq/L (ref 3–11)
BUN: 12.3 mg/dL (ref 7.0–26.0)
CO2: 26 mEq/L (ref 22–29)
CREATININE: 0.7 mg/dL (ref 0.7–1.3)
Calcium: 10.3 mg/dL (ref 8.4–10.4)
Chloride: 97 mEq/L — ABNORMAL LOW (ref 98–109)
EGFR: 84 mL/min/{1.73_m2} — ABNORMAL LOW (ref >90)
Glucose: 104 mg/dl (ref 70–140)
Potassium: 4.8 mEq/L (ref 3.5–5.1)
SODIUM: 127 meq/L — AB (ref 136–145)
Total Bilirubin: 0.33 mg/dL (ref 0.20–1.20)
Total Protein: 6.6 g/dL (ref 6.4–8.3)

## 2015-11-24 LAB — CBC WITH DIFFERENTIAL/PLATELET
BASO%: 0.1 % (ref 0.0–2.0)
Basophils Absolute: 0 10*3/uL (ref 0.0–0.1)
EOS ABS: 0 10*3/uL (ref 0.0–0.5)
EOS%: 0 % (ref 0.0–7.0)
HCT: 27.5 % — ABNORMAL LOW (ref 38.4–49.9)
HGB: 9.5 g/dL — ABNORMAL LOW (ref 13.0–17.1)
LYMPH%: 18.5 % (ref 14.0–49.0)
MCH: 33 pg (ref 27.2–33.4)
MCHC: 34.5 g/dL (ref 32.0–36.0)
MCV: 95.5 fL (ref 79.3–98.0)
MONO#: 0.8 10*3/uL (ref 0.1–0.9)
MONO%: 8.3 % (ref 0.0–14.0)
NEUT#: 7.4 10*3/uL — ABNORMAL HIGH (ref 1.5–6.5)
NEUT%: 73.1 % (ref 39.0–75.0)
PLATELETS: 450 10*3/uL — AB (ref 140–400)
RBC: 2.88 10*6/uL — AB (ref 4.20–5.82)
RDW: 16.9 % — ABNORMAL HIGH (ref 11.0–14.6)
WBC: 10.2 10*3/uL (ref 4.0–10.3)
lymph#: 1.9 10*3/uL (ref 0.9–3.3)

## 2015-11-24 MED ORDER — SODIUM CHLORIDE 0.9 % IV SOLN
Freq: Once | INTRAVENOUS | Status: AC
  Administered 2015-11-24: 13:00:00 via INTRAVENOUS

## 2015-11-24 MED ORDER — SODIUM CHLORIDE 0.9 % IV SOLN
500.0000 mg/m2 | Freq: Once | INTRAVENOUS | Status: AC
  Administered 2015-11-24: 950 mg via INTRAVENOUS
  Filled 2015-11-24: qty 32

## 2015-11-24 MED ORDER — SODIUM CHLORIDE 0.9 % IV SOLN
Freq: Once | INTRAVENOUS | Status: AC
  Administered 2015-11-24: 13:00:00 via INTRAVENOUS
  Filled 2015-11-24: qty 8

## 2015-11-24 MED ORDER — HEPARIN SOD (PORK) LOCK FLUSH 100 UNIT/ML IV SOLN
500.0000 [IU] | Freq: Once | INTRAVENOUS | Status: AC | PRN
  Administered 2015-11-24: 500 [IU]
  Filled 2015-11-24: qty 5

## 2015-11-24 MED ORDER — SODIUM CHLORIDE 0.9 % IV SOLN
412.5000 mg | Freq: Once | INTRAVENOUS | Status: AC
  Administered 2015-11-24: 410 mg via INTRAVENOUS
  Filled 2015-11-24: qty 41

## 2015-11-24 MED ORDER — CYANOCOBALAMIN 1000 MCG/ML IJ SOLN
1000.0000 ug | Freq: Once | INTRAMUSCULAR | Status: AC
  Administered 2015-11-24: 1000 ug via INTRAMUSCULAR

## 2015-11-24 MED ORDER — CYANOCOBALAMIN 1000 MCG/ML IJ SOLN
INTRAMUSCULAR | Status: AC
  Filled 2015-11-24: qty 1

## 2015-11-24 MED ORDER — SODIUM CHLORIDE 0.9 % IJ SOLN
10.0000 mL | INTRAMUSCULAR | Status: DC | PRN
  Administered 2015-11-24: 10 mL
  Filled 2015-11-24: qty 10

## 2015-11-24 NOTE — Telephone Encounter (Signed)
perp of to sch pt appt-pt to get updated copy of sch b4 leaving-adv pt [facility] will call to sch scan

## 2015-11-24 NOTE — Progress Notes (Signed)
[place] [phone]   [phone]  OFFICE PROGRESS NOTE  [name], MD [id number] [employer] [place] [address]  DIAGNOSIS: stage IIIB (T1a, N3, M0) non-small cell lung cancer, adenocarcinoma with negative EGFR mutation, negative ALKgene translocation, negative ROS 1, but positive RET diagnosed in November 2016 and presented with right middle lobe lung nodule in addition to mediastinal and left supraclavicular lymphadenopathy.  PRIOR THERAPY: None  CURRENT THERAPY: Systemic chemotherapy with carboplatin for AUC of 5 and Alimta 500 MG/M2 every 3 weeks, first dose 08/11/2015. Status post 5 cycles.  INTERVAL HISTORY: Sean Cooke 82 y.o. male returns to the clinic today for follow-up visit accompanied by his wife. The patient is currently undergoing systemic chemotherapy with carboplatin and Alimta status post 5 cycles. He tolerated the last cycle of his treatment well except for prolonged fatigue after the chemotherapy. The patient is feeling much better today. He denied having any significant fever or chills. He has no nausea or vomiting. He denied having any significant chest pain, shortness breath, cough or hemoptysis. He denied having any significant weight loss or night sweats. He is here today to start cycle #6 of his treatment.  MEDICAL HISTORY: Past Medical History  Diagnosis Date  . GERD (gastroesophageal reflux disease)   . Hiatal hernia   . Degenerative joint disease   . Rib fracture   . Diverticulosis   . Adenomatous colon polyp 05/2003  . Duodenal diverticulum   . Neuritis/radiculitis due to displacement of lumbar intervertebral disc   . Arthritis   . Pneumonia   . Hemorrhoids   . Encounter for antineoplastic chemotherapy 07/31/2015  . Lung cancer ([place])     ALLERGIES:  is allergic to benzonatate; lyrica; penicillins; pneumococcal vaccine polyvalent; tizanidine; and  tramadol.  MEDICATIONS:  Current Outpatient Prescriptions  Medication Sig Dispense Refill  . acetaminophen (TYLENOL) 650 MG CR tablet Take 650 mg by mouth every 6 (six) hours as needed for pain or fever. Reported on 09/22/2015    . alum & mag hydroxide-simeth (MAALOX PLUS) 400-400-40 MG/5ML suspension Take 5 mLs by mouth every 6 (six) hours as needed for indigestion. Reported on 11/03/2015    . aspirin 81 MG tablet Take 81 mg by mouth at bedtime.     . Calcium Carbonate-Vitamin D (CALCIUM + D PO) Take 1 capsule by mouth 2 (two) times daily.     . cromolyn (OPTICROM) 4 % ophthalmic solution Reported on 10/27/2015    . denosumab (PROLIA) 60 MG/ML SOLN injection Inject 60 mg into the skin every 6 (six) months. Administer in upper arm, thigh, or abdomen    . dexamethasone (DECADRON) 4 MG tablet 4 mg by mouth twice a day the day before, day of and day after the chemotherapy every 3 weeks 40 tablet 1  . folic acid (FOLVITE) 1 MG tablet Take 1 tablet (1 mg total) by mouth daily. 30 tablet 4  . guaiFENesin (MUCINEX) 600 MG 12 hr tablet Take 600 mg by mouth 2 (two) times daily as needed.    . hydrocortisone (PROCTOSOL HC) 2.5 % rectal cream Place 1 application rectally 2 (two) times daily as needed for hemorrhoids. Reported on 09/01/2015    . lactobacillus acidophilus & bulgar (LACTINEX) chewable tablet Chew 1 tablet by mouth 2 (two) times daily. Store in [place], [place] [address]    . lidocaine-prilocaine (EMLA) cream Apply 1 application topically as needed. 30 g 3  . loperamide (IMODIUM) 2 MG  capsule Take 2 mg by mouth as needed for diarrhea or loose stools. Reported on 10/13/2015    . loratadine (CLARITIN) 10 MG tablet Take 10 mg by mouth.    . losartan (COZAAR) 50 MG tablet Take 50 mg by mouth daily.    . meloxicam (MOBIC) 7.5 MG tablet Take 7.5 mg by mouth daily as needed for pain. Reported on 10/27/2015    . Multiple Vitamin (MULTIVITAMIN) tablet Take 1 tablet by mouth daily.    . Omega-3 Fatty Acids (FISH OIL) 1000 MG  CAPS Take 1,000 mg by mouth daily.     Marland Kitchen oxybutynin (DITROPAN) 5 MG tablet Take 5 mg by mouth daily.    . pantoprazole (PROTONIX) 40 MG tablet Take 40 mg by mouth daily.    . polyethylene glycol (MIRALAX / GLYCOLAX) packet Take 17 g by mouth daily.    . pramoxine-hydrocortisone (PROCTOCREAM-HC) 1-1 % rectal cream Place rectally.    . Probiotic Product (ALIGN) 4 MG CAPS Take 1 capsule by mouth daily.    . prochlorperazine (COMPAZINE) 10 MG tablet TAKE 1 TABLET (10 MG TOTAL) BY MOUTH EVERY 6 (SIX) HOURS AS NEEDED FOR NAUSEA OR VOMITING. 30 tablet 0  . saw palmetto 160 MG capsule Take 160 mg by mouth daily.    . sorbitol 70 % solution TAKE 30 MLS BY MOUTH DAILY AS NEEDED FOR MODERATE CONSTIPATION. 473 mL 0  . Tamsulosin HCl (FLOMAX) 0.4 MG CAPS Take 0.4 mg by mouth daily.    Marland Kitchen dextromethorphan-guaiFENesin (ROBITUSSIN-DM) 10-100 MG/5ML liquid Take 10 mLs by mouth every 4 (four) hours as needed for cough. Reported on 10/27/2015     No current facility-administered medications for this visit.    SURGICAL HISTORY:  Past Surgical History  Procedure Laterality Date  . Inguinal hernia repair    . Transurethral resection of prostate    . Splenectomy  2002  . Cataract extraction      bilateral  . Cholecystectomy  06/2003    REVIEW OF SYSTEMS:  A comprehensive review of systems was negative except for: Constitutional: positive for fatigue   PHYSICAL EXAMINATION: General appearance: alert, cooperative, fatigued and no distress Head: Normocephalic, without obvious abnormality, atraumatic Neck: no adenopathy, no JVD, supple, symmetrical, trachea midline and thyroid not enlarged, symmetric, no tenderness/mass/nodules Lymph nodes: Cervical, supraclavicular, and axillary nodes normal. Resp: clear to auscultation bilaterally Back: symmetric, no curvature. ROM normal. No CVA tenderness. Cardio: regular rate and rhythm, S1, S2 normal, no murmur, click, rub or gallop GI: soft, non-tender; bowel sounds  normal; no masses,  no organomegaly Extremities: extremities normal, atraumatic, no cyanosis or edema Neurologic: Alert and oriented X 3, normal strength and tone. Normal symmetric reflexes. Normal coordination and gait  ECOG PERFORMANCE STATUS: 1 - Symptomatic but completely ambulatory  Blood pressure 160/63, pulse 62, temperature 98.6 F (37 C), temperature source Oral, resp. rate 18, height 5\' 8"  (1.727 m), weight 165 lb 12.8 oz (75.206 kg), SpO2 99 %.  LABORATORY DATA: Lab Results  Component Value Date   WBC 10.2 11/24/2015   HGB 9.5* 11/24/2015   HCT 27.5* 11/24/2015   MCV 95.5 11/24/2015   PLT 450* 11/24/2015      Chemistry      Component Value Date/Time   NA 127* 11/24/2015 1116   NA 133* 08/30/2015 1150   K 4.8 11/24/2015 1116   K 4.1 08/30/2015 1150   CL 100* 08/30/2015 1150   CO2 26 11/24/2015 1116   CO2 24 08/30/2015 1150   BUN  12.3 11/24/2015 1116   BUN 8 08/30/2015 1150   CREATININE 0.7 11/24/2015 1116   CREATININE 0.55* 08/30/2015 1150      Component Value Date/Time   CALCIUM 10.3 11/24/2015 1116   CALCIUM 9.8 08/30/2015 1150   ALKPHOS 95 11/24/2015 1116   ALKPHOS 62 08/30/2015 1150   AST 32 11/24/2015 1116   AST 28 08/30/2015 1150   ALT 20 11/24/2015 1116   ALT 25 08/30/2015 1150   BILITOT 0.33 11/24/2015 1116   BILITOT 0.8 08/30/2015 1150       RADIOGRAPHIC STUDIES: No results found.  ASSESSMENT AND PLAN: This is a very pleasant 82 years old white male with a stage IIIB non-small cell lung cancer with negative EGFR mutation, negative ALK gene translocation and PDL 1 expression 10%. The patient is not eligible for concurrent chemoradiation because of the large radiotherapy field. The patient is currently undergoing systemic chemotherapy with carboplatin and Alimta status post 5 cycles. He is rating his treatment well except for fatigue. I recommended for the patient to continue his current treatment with carboplatin and Alimta. He will proceed with  cycle #6 today as a scheduled. He would come back for follow-up visit in 3 weeks for reevaluation With repeat CT scan of the chest, abdomen and pelvis for restaging of his disease. For the hyponatremia, I advised the patient to increase salt intake in his diet. I will continue to monitor his sodium closely on upcoming blood work. He was advised to call immediately if he has any concerning symptoms in the interval. The patient voices understanding of current disease status and treatment options and is in agreement with the current care plan.  All questions were answered. The patient knows to call the clinic with any problems, questions or concerns. We can certainly see the patient much sooner if necessary.  Disclaimer: This note was dictated with voice recognition software. Similar sounding words can inadvertently be transcribed and may not be corrected upon review.

## 2015-11-24 NOTE — Patient Instructions (Signed)
[facility] Discharge Instructions for Patients Receiving Chemotherapy  Today you received the following chemotherapy agents: Alimta and Carboplatin.  To help prevent nausea and vomiting after your treatment, we encourage you to take your nausea medication as directed.   If you develop nausea and vomiting that is not controlled by your nausea medication, call the clinic.   BELOW ARE SYMPTOMS THAT SHOULD BE REPORTED IMMEDIATELY:  *FEVER GREATER THAN 100.5 F  *CHILLS WITH OR WITHOUT FEVER  NAUSEA AND VOMITING THAT IS NOT CONTROLLED WITH YOUR NAUSEA MEDICATION  *UNUSUAL SHORTNESS OF BREATH  *UNUSUAL BRUISING OR BLEEDING  TENDERNESS IN MOUTH AND THROAT WITH OR WITHOUT PRESENCE OF ULCERS  *URINARY PROBLEMS  *BOWEL PROBLEMS  UNUSUAL RASH Items with * indicate a potential emergency and should be followed up as soon as possible.  Feel free to call the clinic you have any questions or concerns. The clinic phone number is [phone].  Please show the CHEMO ALERT CARD at check-in to the Emergency Department and triage nurse.   

## 2015-12-01 ENCOUNTER — Other Ambulatory Visit (HOSPITAL_BASED_OUTPATIENT_CLINIC_OR_DEPARTMENT_OTHER): Payer: PPO

## 2015-12-01 DIAGNOSIS — C342 Malignant neoplasm of middle lobe, bronchus or lung: Secondary | ICD-10-CM | POA: Diagnosis not present

## 2015-12-01 DIAGNOSIS — C3411 Malignant neoplasm of upper lobe, right bronchus or lung: Secondary | ICD-10-CM

## 2015-12-01 LAB — CBC WITH DIFFERENTIAL/PLATELET
BASO%: 0.6 % (ref 0.0–2.0)
Basophils Absolute: 0 10e3/uL (ref 0.0–0.1)
EOS%: 1.1 % (ref 0.0–7.0)
Eosinophils Absolute: 0 10e3/uL (ref 0.0–0.5)
HCT: 28.9 % — ABNORMAL LOW (ref 38.4–49.9)
HGB: 9.8 g/dL — ABNORMAL LOW (ref 13.0–17.1)
LYMPH%: 24.9 % (ref 14.0–49.0)
MCH: 33.9 pg — ABNORMAL HIGH (ref 27.2–33.4)
MCHC: 33.8 g/dL (ref 32.0–36.0)
MCV: 100.2 fL — ABNORMAL HIGH (ref 79.3–98.0)
MONO#: 0.7 10e3/uL (ref 0.1–0.9)
MONO%: 15.7 % — ABNORMAL HIGH (ref 0.0–14.0)
NEUT#: 2.4 10e3/uL (ref 1.5–6.5)
NEUT%: 57.7 % (ref 39.0–75.0)
Platelets: 272 10e3/uL (ref 140–400)
RBC: 2.88 10e6/uL — ABNORMAL LOW (ref 4.20–5.82)
RDW: 17 % — ABNORMAL HIGH (ref 11.0–14.6)
WBC: 4.2 10e3/uL (ref 4.0–10.3)
lymph#: 1.1 10e3/uL (ref 0.9–3.3)

## 2015-12-01 LAB — COMPREHENSIVE METABOLIC PANEL
ALBUMIN: 3.2 g/dL — AB (ref 3.5–5.0)
ALT: 30 U/L (ref 0–55)
ANION GAP: 6 meq/L (ref 3–11)
AST: 35 U/L — AB (ref 5–34)
Alkaline Phosphatase: 66 U/L (ref 40–150)
BUN: 11.1 mg/dL (ref 7.0–26.0)
CALCIUM: 9.3 mg/dL (ref 8.4–10.4)
CO2: 25 mEq/L (ref 22–29)
Chloride: 99 mEq/L (ref 98–109)
Creatinine: 0.7 mg/dL (ref 0.7–1.3)
EGFR: 87 mL/min/{1.73_m2} — AB (ref >90)
Glucose: 122 mg/dl (ref 70–140)
POTASSIUM: 4.3 meq/L (ref 3.5–5.1)
SODIUM: 130 meq/L — AB (ref 136–145)
Total Bilirubin: 0.55 mg/dL (ref 0.20–1.20)
Total Protein: 5.8 g/dL — ABNORMAL LOW (ref 6.4–8.3)

## 2015-12-02 ENCOUNTER — Other Ambulatory Visit: Payer: Self-pay | Admitting: Internal Medicine

## 2015-12-08 ENCOUNTER — Encounter (HOSPITAL_COMMUNITY): Payer: Self-pay

## 2015-12-08 ENCOUNTER — Other Ambulatory Visit (HOSPITAL_BASED_OUTPATIENT_CLINIC_OR_DEPARTMENT_OTHER): Payer: PPO

## 2015-12-08 ENCOUNTER — Ambulatory Visit (HOSPITAL_COMMUNITY)
Admission: RE | Admit: 2015-12-08 | Discharge: 2015-12-08 | Disposition: A | Discharge status: Home / Self Care | Payer: PPO | Source: Ambulatory Visit | Attending: Internal Medicine | Admitting: Internal Medicine

## 2015-12-08 DIAGNOSIS — I313 Pericardial effusion (noninflammatory): Secondary | ICD-10-CM | POA: Diagnosis not present

## 2015-12-08 DIAGNOSIS — C3411 Malignant neoplasm of upper lobe, right bronchus or lung: Secondary | ICD-10-CM

## 2015-12-08 DIAGNOSIS — J90 Pleural effusion, not elsewhere classified: Secondary | ICD-10-CM | POA: Diagnosis not present

## 2015-12-08 DIAGNOSIS — R918 Other nonspecific abnormal finding of lung field: Secondary | ICD-10-CM | POA: Diagnosis not present

## 2015-12-08 DIAGNOSIS — Z9221 Personal history of antineoplastic chemotherapy: Secondary | ICD-10-CM | POA: Diagnosis not present

## 2015-12-08 DIAGNOSIS — Z5111 Encounter for antineoplastic chemotherapy: Secondary | ICD-10-CM

## 2015-12-08 DIAGNOSIS — N200 Calculus of kidney: Secondary | ICD-10-CM | POA: Insufficient documentation

## 2015-12-08 LAB — CBC WITH DIFFERENTIAL/PLATELET
BASO%: 0.1 % (ref 0.0–2.0)
Basophils Absolute: 0 10*3/uL (ref 0.0–0.1)
EOS%: 0.4 % (ref 0.0–7.0)
Eosinophils Absolute: 0 10*3/uL (ref 0.0–0.5)
HCT: 28.8 % — ABNORMAL LOW (ref 38.4–49.9)
HGB: 9.7 g/dL — ABNORMAL LOW (ref 13.0–17.1)
LYMPH%: 17.4 % (ref 14.0–49.0)
MCH: 33.9 pg — ABNORMAL HIGH (ref 27.2–33.4)
MCHC: 33.5 g/dL (ref 32.0–36.0)
MCV: 101.2 fL — ABNORMAL HIGH (ref 79.3–98.0)
MONO#: 1.5 10*3/uL — ABNORMAL HIGH (ref 0.1–0.9)
MONO%: 21.1 % — AB (ref 0.0–14.0)
NEUT%: 61 % (ref 39.0–75.0)
NEUTROS ABS: 4.4 10*3/uL (ref 1.5–6.5)
PLATELETS: 168 10*3/uL (ref 140–400)
RBC: 2.85 10*6/uL — AB (ref 4.20–5.82)
RDW: 17.1 % — ABNORMAL HIGH (ref 11.0–14.6)
WBC: 7.3 10*3/uL (ref 4.0–10.3)
lymph#: 1.3 10*3/uL (ref 0.9–3.3)

## 2015-12-08 LAB — COMPREHENSIVE METABOLIC PANEL
ALK PHOS: 75 U/L (ref 40–150)
ALT: 32 U/L (ref 0–55)
AST: 39 U/L — AB (ref 5–34)
Albumin: 3.5 g/dL (ref 3.5–5.0)
Anion Gap: 7 mEq/L (ref 3–11)
BUN: 8.6 mg/dL (ref 7.0–26.0)
CHLORIDE: 98 meq/L (ref 98–109)
CO2: 27 mEq/L (ref 22–29)
Calcium: 10.1 mg/dL (ref 8.4–10.4)
Creatinine: 0.7 mg/dL (ref 0.7–1.3)
EGFR: 87 mL/min/{1.73_m2} — ABNORMAL LOW (ref >90)
Glucose: 88 mg/dl (ref 70–140)
Potassium: 4.2 mEq/L (ref 3.5–5.1)
SODIUM: 132 meq/L — AB (ref 136–145)
Total Bilirubin: 0.47 mg/dL (ref 0.20–1.20)
Total Protein: 6.5 g/dL (ref 6.4–8.3)

## 2015-12-08 MED ORDER — IOPAMIDOL (ISOVUE-300) INJECTION 61%
100.0000 mL | Freq: Once | INTRAVENOUS | Status: AC | PRN
  Administered 2015-12-08: 100 mL via INTRAVENOUS

## 2015-12-15 ENCOUNTER — Ambulatory Visit (HOSPITAL_BASED_OUTPATIENT_CLINIC_OR_DEPARTMENT_OTHER): Payer: PPO | Admitting: Internal Medicine

## 2015-12-15 ENCOUNTER — Encounter: Payer: Self-pay | Admitting: *Deleted

## 2015-12-15 ENCOUNTER — Other Ambulatory Visit (HOSPITAL_BASED_OUTPATIENT_CLINIC_OR_DEPARTMENT_OTHER): Payer: PPO

## 2015-12-15 ENCOUNTER — Telehealth: Payer: Self-pay | Admitting: Internal Medicine

## 2015-12-15 ENCOUNTER — Encounter: Payer: Self-pay | Admitting: Internal Medicine

## 2015-12-15 VITALS — BP 133/73 | HR 74 | Temp 98.5°F | Resp 18 | Ht 68.0 in | Wt 166.4 lb

## 2015-12-15 DIAGNOSIS — C342 Malignant neoplasm of middle lobe, bronchus or lung: Secondary | ICD-10-CM

## 2015-12-15 DIAGNOSIS — Z5111 Encounter for antineoplastic chemotherapy: Secondary | ICD-10-CM

## 2015-12-15 DIAGNOSIS — C3411 Malignant neoplasm of upper lobe, right bronchus or lung: Secondary | ICD-10-CM

## 2015-12-15 DIAGNOSIS — C3491 Malignant neoplasm of unspecified part of right bronchus or lung: Secondary | ICD-10-CM

## 2015-12-15 DIAGNOSIS — E871 Hypo-osmolality and hyponatremia: Secondary | ICD-10-CM

## 2015-12-15 LAB — CBC WITH DIFFERENTIAL/PLATELET
BASO%: 0.2 % (ref 0.0–2.0)
Basophils Absolute: 0 10*3/uL (ref 0.0–0.1)
EOS ABS: 0.1 10*3/uL (ref 0.0–0.5)
EOS%: 1.6 % (ref 0.0–7.0)
HEMATOCRIT: 28.9 % — AB (ref 38.4–49.9)
HEMOGLOBIN: 9.9 g/dL — AB (ref 13.0–17.1)
LYMPH#: 1.3 10*3/uL (ref 0.9–3.3)
LYMPH%: 23.3 % (ref 14.0–49.0)
MCH: 34.3 pg — ABNORMAL HIGH (ref 27.2–33.4)
MCHC: 34.3 g/dL (ref 32.0–36.0)
MCV: 100 fL — ABNORMAL HIGH (ref 79.3–98.0)
MONO#: 1.6 10*3/uL — AB (ref 0.1–0.9)
MONO%: 29.1 % — ABNORMAL HIGH (ref 0.0–14.0)
NEUT%: 45.8 % (ref 39.0–75.0)
NEUTROS ABS: 2.5 10*3/uL (ref 1.5–6.5)
PLATELETS: 419 10*3/uL — AB (ref 140–400)
RBC: 2.89 10*6/uL — AB (ref 4.20–5.82)
RDW: 17 % — AB (ref 11.0–14.6)
WBC: 5.5 10*3/uL (ref 4.0–10.3)

## 2015-12-15 LAB — COMPREHENSIVE METABOLIC PANEL
ALBUMIN: 3.4 g/dL — AB (ref 3.5–5.0)
ALK PHOS: 77 U/L (ref 40–150)
ALT: 25 U/L (ref 0–55)
ANION GAP: 7 meq/L (ref 3–11)
AST: 36 U/L — ABNORMAL HIGH (ref 5–34)
BILIRUBIN TOTAL: 0.43 mg/dL (ref 0.20–1.20)
BUN: 7.8 mg/dL (ref 7.0–26.0)
CALCIUM: 9.8 mg/dL (ref 8.4–10.4)
CO2: 26 meq/L (ref 22–29)
CREATININE: 0.7 mg/dL (ref 0.7–1.3)
Chloride: 98 mEq/L (ref 98–109)
EGFR: 85 mL/min/{1.73_m2} — ABNORMAL LOW (ref >90)
Glucose: 118 mg/dl (ref 70–140)
Potassium: 4.4 mEq/L (ref 3.5–5.1)
Sodium: 130 mEq/L — ABNORMAL LOW (ref 136–145)
TOTAL PROTEIN: 6.3 g/dL — AB (ref 6.4–8.3)

## 2015-12-15 NOTE — Telephone Encounter (Signed)
Gave pt appt & avs .. Pt scheduled all the way through to july

## 2015-12-15 NOTE — Progress Notes (Signed)
Oncology Nurse Navigator Documentation  Oncology Nurse Navigator Flowsheets 12/15/2015  Navigator Location [facility]Med Onc  Patient Visit Type MedOnc  Treatment Phase Treatment  Barriers/Navigation Needs Education  Education Other  Interventions Education Method  Education Method Verbal  Acuity Level 1  Time Spent with Patient 15   Spoke with patient and wife today at [facility]  He is doing well.  He will be starting maintenance chemo tx.  Educated on tx plan

## 2015-12-15 NOTE — Progress Notes (Signed)
[facility] [phone]   [phone]  OFFICE PROGRESS NOTE  [name], MD [id number] [employer] [place] [address]  DIAGNOSIS: stage IIIB (T1a, N3, M0) non-small cell lung cancer, adenocarcinoma with negative EGFR mutation, negative ALKgene translocation, negative ROS 1, but positive RET diagnosed in November 2016 and presented with right middle lobe lung nodule in addition to mediastinal and left supraclavicular lymphadenopathy.  PRIOR THERAPY: Systemic chemotherapy with carboplatin for AUC of 5 and Alimta 500 MG/M2 every 3 weeks, first dose 08/11/2015. Status post 6 cycles with partial response.  CURRENT THERAPY: Maintenance Systemic chemotherapy with single agent Alimta 500 MG/M2 every 3 weeks, first dose 12/22/2015.   INTERVAL HISTORY: Sean Cooke 82 y.o. male returns to the clinic today for follow-up visit accompanied by his wife. The patient completed induction systemic chemotherapy with carboplatin and Alimta status post 6 cycles. He tolerated the last cycle of his treatment well except for fatigue and mouth sores after the chemotherapy. He denied magic mouthwash as well as Biotene and salt rinse The patient is feeling much better today. He denied having any significant fever or chills. He has no nausea or vomiting. He denied having any significant chest pain, shortness breath, cough or hemoptysis. He denied having any significant weight loss or night sweats. He had repeat CT scan of the chest, abdomen and pelvis performed recently and he is here for evaluation and discussion of his scan results.  MEDICAL HISTORY: Past Medical History  Diagnosis Date  . GERD (gastroesophageal reflux disease)   . Hiatal hernia   . Degenerative joint disease   . Rib fracture   . Diverticulosis   . Adenomatous colon polyp 05/2003  . Duodenal diverticulum   . Neuritis/radiculitis due to displacement of lumbar  intervertebral disc   . Arthritis   . Pneumonia   . Hemorrhoids   . Encounter for antineoplastic chemotherapy 07/31/2015  . Lung cancer ([facility])     ALLERGIES:  is allergic to benzonatate; lyrica; penicillins; pneumococcal vaccine polyvalent; tizanidine; and tramadol.  MEDICATIONS:  Current Outpatient Prescriptions  Medication Sig Dispense Refill  . acetaminophen (TYLENOL) 650 MG CR tablet Take 650 mg by mouth every 6 (six) hours as needed for pain or fever. Reported on 09/22/2015    . alum & mag hydroxide-simeth (MAALOX PLUS) 400-400-40 MG/5ML suspension Take 5 mLs by mouth every 6 (six) hours as needed for indigestion. Reported on 11/03/2015    . aspirin 81 MG tablet Take 81 mg by mouth at bedtime.     . Calcium Carbonate-Vitamin D (CALCIUM + D PO) Take 1 capsule by mouth 2 (two) times daily.     . cromolyn (OPTICROM) 4 % ophthalmic solution Reported on 10/27/2015    . denosumab (PROLIA) 60 MG/ML SOLN injection Inject 60 mg into the skin every 6 (six) months. Administer in upper arm, thigh, or abdomen    . dexamethasone (DECADRON) 4 MG tablet 4 mg by mouth twice a day the day before, day of and day after the chemotherapy every 3 weeks 40 tablet 1  . dextromethorphan-guaiFENesin (ROBITUSSIN-DM) 10-100 MG/5ML liquid Take 10 mLs by mouth every 4 (four) hours as needed for cough. Reported on 6/43/3295    . folic acid (FOLVITE) 1 MG tablet Take 1 tablet (1 mg total) by mouth daily. 30 tablet 4  . guaiFENesin (MUCINEX) 600 MG 12 hr tablet Take 600 mg by mouth 2 (two) times daily as needed.    Marland Kitchen  hydrocortisone (PROCTOSOL HC) 2.5 % rectal cream Place 1 application rectally 2 (two) times daily as needed for hemorrhoids. Reported on 09/01/2015    . lactobacillus acidophilus & bulgar (LACTINEX) chewable tablet Chew 1 tablet by mouth 2 (two) times daily. Store in [place], [place]    . lidocaine-prilocaine (EMLA) cream Apply 1 application topically as needed. 30 g 3  . loperamide (IMODIUM) 2 MG capsule Take 2 mg  by mouth as needed for diarrhea or loose stools. Reported on 10/13/2015    . loratadine (CLARITIN) 10 MG tablet Take 10 mg by mouth.    . losartan (COZAAR) 50 MG tablet Take 50 mg by mouth daily.    . magic mouthwash SOLN Take 5 mLs by mouth 3 (three) times daily. DUKES  3  . meloxicam (MOBIC) 7.5 MG tablet Take 7.5 mg by mouth daily as needed for pain. Reported on 10/27/2015    . Multiple Vitamin (MULTIVITAMIN) tablet Take 1 tablet by mouth daily.    Marland Kitchen nystatin (MYCOSTATIN) 100000 UNIT/ML suspension USE AS DIRECTED 5 MLS IN THE MOUTH OR THROAT 3 (THREE) TIMES DAILY. 240 mL 3  . Omega-3 Fatty Acids (FISH OIL) 1000 MG CAPS Take 1,000 mg by mouth daily.     Marland Kitchen oxybutynin (DITROPAN) 5 MG tablet Take 5 mg by mouth daily.    . pantoprazole (PROTONIX) 40 MG tablet Take 40 mg by mouth daily.    . polyethylene glycol (MIRALAX / GLYCOLAX) packet Take 17 g by mouth daily.    . pramoxine-hydrocortisone (PROCTOCREAM-HC) 1-1 % rectal cream Place rectally.    . Probiotic Product (ALIGN) 4 MG CAPS Take 1 capsule by mouth daily.    . prochlorperazine (COMPAZINE) 10 MG tablet TAKE 1 TABLET (10 MG TOTAL) BY MOUTH EVERY 6 (SIX) HOURS AS NEEDED FOR NAUSEA OR VOMITING. 30 tablet 0  . saw palmetto 160 MG capsule Take 160 mg by mouth daily.    . Tamsulosin HCl (FLOMAX) 0.4 MG CAPS Take 0.4 mg by mouth daily.     No current facility-administered medications for this visit.    SURGICAL HISTORY:  Past Surgical History  Procedure Laterality Date  . Inguinal hernia repair    . Transurethral resection of prostate    . Splenectomy  2002  . Cataract extraction      bilateral  . Cholecystectomy  06/2003    REVIEW OF SYSTEMS:  Constitutional: positive for fatigue Eyes: negative Ears, nose, mouth, throat, and face: positive for sore mouth Respiratory: negative Cardiovascular: negative Gastrointestinal: negative Genitourinary:negative Integument/breast: negative Hematologic/lymphatic:  negative Musculoskeletal:negative Neurological: negative Behavioral/Psych: negative Endocrine: negative Allergic/Immunologic: negative   PHYSICAL EXAMINATION: General appearance: alert, cooperative, fatigued and no distress Head: Normocephalic, without obvious abnormality, atraumatic Neck: no adenopathy, no JVD, supple, symmetrical, trachea midline and thyroid not enlarged, symmetric, no tenderness/mass/nodules Lymph nodes: Cervical, supraclavicular, and axillary nodes normal. Resp: clear to auscultation bilaterally Back: symmetric, no curvature. ROM normal. No CVA tenderness. Cardio: regular rate and rhythm, S1, S2 normal, no murmur, click, rub or gallop GI: soft, non-tender; bowel sounds normal; no masses,  no organomegaly Extremities: extremities normal, atraumatic, no cyanosis or edema Neurologic: Alert and oriented X 3, normal strength and tone. Normal symmetric reflexes. Normal coordination and gait  ECOG PERFORMANCE STATUS: 1 - Symptomatic but completely ambulatory  Blood pressure 133/73, pulse 74, temperature 98.5 F (36.9 C), temperature source Oral, resp. rate 18, height _0  (1.727 m), weight 166 lb 6.4 oz (75.479 kg), SpO2 99 %.  LABORATORY DATA: Lab Results  Component Value Date   WBC 5.5 12/15/2015   HGB 9.9* 12/15/2015   HCT 28.9* 12/15/2015   MCV 100.0* 12/15/2015   PLT 419* 12/15/2015      Chemistry      Component Value Date/Time   NA 130* 12/15/2015 1002   NA 133* 08/30/2015 1150   K 4.4 12/15/2015 1002   K 4.1 08/30/2015 1150   CL 100* 08/30/2015 1150   CO2 26 12/15/2015 1002   CO2 24 08/30/2015 1150   BUN 7.8 12/15/2015 1002   BUN 8 08/30/2015 1150   CREATININE 0.7 12/15/2015 1002   CREATININE 0.55* 08/30/2015 1150      Component Value Date/Time   CALCIUM 9.8 12/15/2015 1002   CALCIUM 9.8 08/30/2015 1150   ALKPHOS 77 12/15/2015 1002   ALKPHOS 62 08/30/2015 1150   AST 36* 12/15/2015 1002   AST 28 08/30/2015 1150   ALT 25 12/15/2015 1002    ALT 25 08/30/2015 1150   BILITOT 0.43 12/15/2015 1002   BILITOT 0.8 08/30/2015 1150       RADIOGRAPHIC STUDIES: Ct Chest W Contrast  12/08/2015  CLINICAL DATA:  Right lung cancer diagnosed 07/2015, chemotherapy in progress. Prior splenectomy and cholecystectomy. Diarrhea and constipation. EXAM: CT CHEST, ABDOMEN, AND PELVIS WITH CONTRAST TECHNIQUE: Multidetector CT imaging of the chest, abdomen and pelvis was performed following the standard protocol during bolus administration of intravenous contrast. CONTRAST:  163m ISOVUE-300 IOPAMIDOL (ISOVUE-300) INJECTION 61% COMPARISON:  10/11/2015 FINDINGS: CT CHEST FINDINGS Mediastinum/Nodes: The heart is normal in size. Small pericardial effusion. Three vessel coronary atherosclerosis. Mild atherosclerotic calcifications of the aortic arch. Right chest port terminates at the cavoatrial junction. 8 mm short axis low right paratracheal node (series 2/ image 24), grossly unchanged. Visualized thyroid is unremarkable. Lungs/Pleura: Residual scarring in the right middle lobe without discrete nodule (series 5/ image 64). Additional platelike scarring/ atelectasis inferiorly (series 5/ image 68). No suspicious pulmonary nodules. Mild emphysematous changes. No pleural effusion or pneumothorax Musculoskeletal: Degenerative changes of the thoracic spine. CT ABDOMEN PELVIS FINDINGS Hepatobiliary: 10 mm hypoenhancing lesion in the anterior right hepatic dome (series 2/ image 43), unchanged. No suspicious/enhancing hepatic lesions. Status post cholecystectomy. No intrahepatic or extrahepatic ductal dilatation. Pancreas: Within normal limits. Spleen: Status post splenectomy, with residual splenic tissue/ stenosis in the left upper abdomen (series 2/image 51). Adrenals/Urinary Tract: Adrenal glands are within normal limits. 7 mm nonobstructing left lower pole renal calculus (series 2/ image 73). 12 mm cyst in the medial right upper kidney (series 4/ image 23). No  hydronephrosis. Bladder is within normal limits. Stomach/Bowel: Stomach is within normal limits. No evidence of bowel obstruction. Normal appendix (series 2/image 35). Colonic diverticulosis, without evidence of diverticulitis. Vascular/Lymphatic: Atherosclerotic calcifications of the abdominal aorta and branch vessels. No evidence of abdominal aortic aneurysm. No suspicious abdominopelvic lymphadenopathy. Reproductive: Prostatomegaly. Other: No abdominopelvic ascites. Musculoskeletal: Mild degenerative changes of the lumbar spine. IMPRESSION: Residual right middle lobe scarring/atelectasis without discrete nodule. No findings specific for metastatic disease. Small pericardial effusion, unchanged. 7 mm nonobstructing left lower pole renal calculus. No hydronephrosis. Additional stable ancillary findings as above. Electronically Signed   By: SJulian HyM.D.   On: 12/08/2015 12:37   Ct Abdomen Pelvis W Contrast  12/08/2015  CLINICAL DATA:  Right lung cancer diagnosed 07/2015, chemotherapy in progress. Prior splenectomy and cholecystectomy. Diarrhea and constipation. EXAM: CT CHEST, ABDOMEN, AND PELVIS WITH CONTRAST TECHNIQUE: Multidetector CT imaging of the chest, abdomen and pelvis was performed following the standard protocol during bolus administration  of intravenous contrast. CONTRAST:  157m ISOVUE-300 IOPAMIDOL (ISOVUE-300) INJECTION 61% COMPARISON:  10/11/2015 FINDINGS: CT CHEST FINDINGS Mediastinum/Nodes: The heart is normal in size. Small pericardial effusion. Three vessel coronary atherosclerosis. Mild atherosclerotic calcifications of the aortic arch. Right chest port terminates at the cavoatrial junction. 8 mm short axis low right paratracheal node (series 2/ image 24), grossly unchanged. Visualized thyroid is unremarkable. Lungs/Pleura: Residual scarring in the right middle lobe without discrete nodule (series 5/ image 64). Additional platelike scarring/ atelectasis inferiorly (series 5/ image  68). No suspicious pulmonary nodules. Mild emphysematous changes. No pleural effusion or pneumothorax Musculoskeletal: Degenerative changes of the thoracic spine. CT ABDOMEN PELVIS FINDINGS Hepatobiliary: 10 mm hypoenhancing lesion in the anterior right hepatic dome (series 2/ image 43), unchanged. No suspicious/enhancing hepatic lesions. Status post cholecystectomy. No intrahepatic or extrahepatic ductal dilatation. Pancreas: Within normal limits. Spleen: Status post splenectomy, with residual splenic tissue/ stenosis in the left upper abdomen (series 2/image 51). Adrenals/Urinary Tract: Adrenal glands are within normal limits. 7 mm nonobstructing left lower pole renal calculus (series 2/ image 73). 12 mm cyst in the medial right upper kidney (series 4/ image 23). No hydronephrosis. Bladder is within normal limits. Stomach/Bowel: Stomach is within normal limits. No evidence of bowel obstruction. Normal appendix (series 2/image 35). Colonic diverticulosis, without evidence of diverticulitis. Vascular/Lymphatic: Atherosclerotic calcifications of the abdominal aorta and branch vessels. No evidence of abdominal aortic aneurysm. No suspicious abdominopelvic lymphadenopathy. Reproductive: Prostatomegaly. Other: No abdominopelvic ascites. Musculoskeletal: Mild degenerative changes of the lumbar spine. IMPRESSION: Residual right middle lobe scarring/atelectasis without discrete nodule. No findings specific for metastatic disease. Small pericardial effusion, unchanged. 7 mm nonobstructing left lower pole renal calculus. No hydronephrosis. Additional stable ancillary findings as above. Electronically Signed   By: SJulian HyM.D.   On: 12/08/2015 12:37    ASSESSMENT AND PLAN: This is a very pleasant 82years old white male with a stage IIIB non-small cell lung cancer with negative EGFR mutation, negative ALK gene translocation and PDL 1 expression 10%. The patient is not eligible for concurrent chemoradiation  because of the large radiotherapy field. The patient completed induction systemic chemotherapy with carboplatin and Alimta status post 6 cycles. He is rating his treatment well except for fatigue and sore mouth. The recent CT scan of the chest, abdomen and pelvis showed no concerning finding for residual tumor or metastatic disease. I discussed the scan results with the patient and his wife today. I gave him the option of continuous observation and imaging studies in 3 months versus proceeding with maintenance systemic chemotherapy with single agent Alimta 500 MG/M2 every 3 weeks. The patient is interested in maintenance chemotherapy. He is expected to start the first dose of his maintenance treatment on 12/22/2015. I will see him back for follow-up visit in 4 weeks for evaluation before starting cycle #2. For the hyponatremia, currently stable. I advised the patient to increase salt intake in his diet. I will continue to monitor his sodium closely on upcoming blood work. He was advised to call immediately if he has any concerning symptoms in the interval. The patient voices understanding of current disease status and treatment options and is in agreement with the current care plan.  All questions were answered. The patient knows to call the clinic with any problems, questions or concerns. We can certainly see the patient much sooner if necessary.  Disclaimer: This note was dictated with voice recognition software. Similar sounding words can inadvertently be transcribed and may not be corrected upon review.

## 2015-12-17 ENCOUNTER — Telehealth: Payer: Self-pay | Admitting: Internal Medicine

## 2015-12-17 ENCOUNTER — Telehealth: Payer: Self-pay | Admitting: *Deleted

## 2015-12-17 ENCOUNTER — Telehealth: Payer: Self-pay

## 2015-12-17 NOTE — Telephone Encounter (Signed)
Patient called and would like to put off his next chemotherapy by one week, he is calling to get approval from MD.  Call transferred to [facility], [place].  Patient would like a call back today.

## 2015-12-17 NOTE — Telephone Encounter (Signed)
Pt called to request treatment start date be moved to 5/17 as he will be out of town in may and July. Reviewed with MD, ok to delay start date to 5/17, POF to scheduling  Returned call to pt advised MD is ok with treatment start date of 5/17. No additional concerns.

## 2015-12-17 NOTE — Telephone Encounter (Signed)
Pt starting tx on 5/17 appt changed per provider per pof

## 2015-12-22 ENCOUNTER — Other Ambulatory Visit: Payer: PPO

## 2015-12-22 ENCOUNTER — Ambulatory Visit: Payer: PPO

## 2015-12-23 ENCOUNTER — Telehealth: Payer: Self-pay | Admitting: Medical Oncology

## 2015-12-23 ENCOUNTER — Telehealth: Payer: Self-pay | Admitting: *Deleted

## 2015-12-23 NOTE — Telephone Encounter (Signed)
Can he get Prolia next week . Per Julien Nordmann it is okay to get Prolia.

## 2015-12-23 NOTE — Telephone Encounter (Signed)
Voicemail: "I need to talk to collaborative nurse. I'm seeing a doctor tomorrow about a Prolia shot for my bone density.  The preliminary meeting is tomorrow but I need to know when is the best time to get this shot.  Call me at [phone]."

## 2015-12-24 DIAGNOSIS — M810 Age-related osteoporosis without current pathological fracture: Secondary | ICD-10-CM | POA: Diagnosis not present

## 2015-12-24 DIAGNOSIS — E559 Vitamin D deficiency, unspecified: Secondary | ICD-10-CM | POA: Diagnosis not present

## 2015-12-27 ENCOUNTER — Telehealth: Payer: Self-pay

## 2015-12-27 NOTE — Telephone Encounter (Signed)
Patient called inquiring if he needed someone to come with him to his infusion appt this wed. Called and informed patient he did not have to have anyone bring him per Stanton Kidney, Therapist, sports. Pt verbalized understanding and denies any further questions or concerns at this time.

## 2015-12-28 ENCOUNTER — Other Ambulatory Visit: Payer: Self-pay | Admitting: Internal Medicine

## 2015-12-28 NOTE — Telephone Encounter (Signed)
MACHI WHITTAKER  12/23/2015  Telephone  MRN:  [id number]   Description: 82 year old male  Provider: Ardeen Garland, RN  Department: [facility]          Call Documentation      Ardeen Garland, RN at 12/23/2015 10:42 AM     Status: Signed       Expand All Collapse All   Can he get Prolia next week . Per Julien Nordmann it is okay to get Prolia.

## 2015-12-29 ENCOUNTER — Other Ambulatory Visit (HOSPITAL_BASED_OUTPATIENT_CLINIC_OR_DEPARTMENT_OTHER): Payer: PPO

## 2015-12-29 ENCOUNTER — Ambulatory Visit (HOSPITAL_BASED_OUTPATIENT_CLINIC_OR_DEPARTMENT_OTHER): Payer: PPO

## 2015-12-29 VITALS — BP 147/66 | HR 68 | Temp 97.0°F | Resp 18

## 2015-12-29 DIAGNOSIS — Z5111 Encounter for antineoplastic chemotherapy: Secondary | ICD-10-CM

## 2015-12-29 DIAGNOSIS — C3411 Malignant neoplasm of upper lobe, right bronchus or lung: Secondary | ICD-10-CM

## 2015-12-29 DIAGNOSIS — C342 Malignant neoplasm of middle lobe, bronchus or lung: Secondary | ICD-10-CM | POA: Diagnosis not present

## 2015-12-29 LAB — COMPREHENSIVE METABOLIC PANEL
ALT: 26 U/L (ref 0–55)
AST: 33 U/L (ref 5–34)
Albumin: 3.7 g/dL (ref 3.5–5.0)
Alkaline Phosphatase: 75 U/L (ref 40–150)
Anion Gap: 8 mEq/L (ref 3–11)
BILIRUBIN TOTAL: 0.64 mg/dL (ref 0.20–1.20)
BUN: 10.2 mg/dL (ref 7.0–26.0)
CO2: 24 meq/L (ref 22–29)
Calcium: 9.7 mg/dL (ref 8.4–10.4)
Chloride: 97 mEq/L — ABNORMAL LOW (ref 98–109)
Creatinine: 0.8 mg/dL (ref 0.7–1.3)
EGFR: 82 mL/min/{1.73_m2} — AB (ref >90)
GLUCOSE: 179 mg/dL — AB (ref 70–140)
Potassium: 3.7 mEq/L (ref 3.5–5.1)
SODIUM: 128 meq/L — AB (ref 136–145)
TOTAL PROTEIN: 6.7 g/dL (ref 6.4–8.3)

## 2015-12-29 LAB — CBC WITH DIFFERENTIAL/PLATELET
BASO%: 0.1 % (ref 0.0–2.0)
Basophils Absolute: 0 10*3/uL (ref 0.0–0.1)
EOS ABS: 0 10*3/uL (ref 0.0–0.5)
EOS%: 0 % (ref 0.0–7.0)
HCT: 32.7 % — ABNORMAL LOW (ref 38.4–49.9)
HGB: 10.8 g/dL — ABNORMAL LOW (ref 13.0–17.1)
LYMPH%: 8.3 % — AB (ref 14.0–49.0)
MCH: 33.8 pg — ABNORMAL HIGH (ref 27.2–33.4)
MCHC: 33 g/dL (ref 32.0–36.0)
MCV: 102.6 fL — AB (ref 79.3–98.0)
MONO#: 1.8 10*3/uL — ABNORMAL HIGH (ref 0.1–0.9)
MONO%: 13.8 % (ref 0.0–14.0)
NEUT%: 77.8 % — ABNORMAL HIGH (ref 39.0–75.0)
NEUTROS ABS: 9.9 10*3/uL — AB (ref 1.5–6.5)
Platelets: 298 10*3/uL (ref 140–400)
RBC: 3.19 10*6/uL — AB (ref 4.20–5.82)
RDW: 15.7 % — ABNORMAL HIGH (ref 11.0–14.6)
WBC: 12.8 10*3/uL — AB (ref 4.0–10.3)
lymph#: 1.1 10*3/uL (ref 0.9–3.3)

## 2015-12-29 MED ORDER — PROCHLORPERAZINE MALEATE 10 MG PO TABS
10.0000 mg | ORAL_TABLET | Freq: Once | ORAL | Status: AC
  Administered 2015-12-29: 10 mg via ORAL

## 2015-12-29 MED ORDER — PROCHLORPERAZINE MALEATE 10 MG PO TABS
ORAL_TABLET | ORAL | Status: AC
  Filled 2015-12-29: qty 1

## 2015-12-29 MED ORDER — SODIUM CHLORIDE 0.9% FLUSH
10.0000 mL | INTRAVENOUS | Status: DC | PRN
  Administered 2015-12-29: 10 mL
  Filled 2015-12-29: qty 10

## 2015-12-29 MED ORDER — HEPARIN SOD (PORK) LOCK FLUSH 100 UNIT/ML IV SOLN
500.0000 [IU] | Freq: Once | INTRAVENOUS | Status: AC | PRN
  Administered 2015-12-29: 500 [IU]
  Filled 2015-12-29: qty 5

## 2015-12-29 MED ORDER — SODIUM CHLORIDE 0.9 % IV SOLN
Freq: Once | INTRAVENOUS | Status: AC
  Administered 2015-12-29: 10:00:00 via INTRAVENOUS

## 2015-12-29 MED ORDER — PEMETREXED DISODIUM CHEMO INJECTION 500 MG
500.0000 mg/m2 | Freq: Once | INTRAVENOUS | Status: AC
  Administered 2015-12-29: 950 mg via INTRAVENOUS
  Filled 2015-12-29: qty 38

## 2015-12-29 NOTE — Patient Instructions (Signed)
[facility] Discharge Instructions for Patients Receiving Chemotherapy  Today you received the following chemotherapy agents; Alimta.   To help prevent nausea and vomiting after your treatment, we encourage you to take your nausea medication as directed.    If you develop nausea and vomiting that is not controlled by your nausea medication, call the clinic.   BELOW ARE SYMPTOMS THAT SHOULD BE REPORTED IMMEDIATELY:  *FEVER GREATER THAN 100.5 F  *CHILLS WITH OR WITHOUT FEVER  NAUSEA AND VOMITING THAT IS NOT CONTROLLED WITH YOUR NAUSEA MEDICATION  *UNUSUAL SHORTNESS OF BREATH  *UNUSUAL BRUISING OR BLEEDING  TENDERNESS IN MOUTH AND THROAT WITH OR WITHOUT PRESENCE OF ULCERS  *URINARY PROBLEMS  *BOWEL PROBLEMS  UNUSUAL RASH Items with * indicate a potential emergency and should be followed up as soon as possible.  Feel free to call the clinic you have any questions or concerns. The clinic phone number is [phone].  Please show the CHEMO ALERT CARD at check-in to the Emergency Department and triage nurse.   

## 2016-01-03 DIAGNOSIS — R7303 Prediabetes: Secondary | ICD-10-CM | POA: Diagnosis not present

## 2016-01-03 DIAGNOSIS — I10 Essential (primary) hypertension: Secondary | ICD-10-CM | POA: Diagnosis not present

## 2016-01-03 DIAGNOSIS — E871 Hypo-osmolality and hyponatremia: Secondary | ICD-10-CM | POA: Diagnosis not present

## 2016-01-04 ENCOUNTER — Telehealth: Payer: Self-pay | Admitting: Medical Oncology

## 2016-01-04 NOTE — Telephone Encounter (Signed)
Pt stated Dr Valora Piccolo told him his sodium is low from Na+ results yesterday and he is cutting back his fluids to 1400 ml/day. Pt asking if treatment causes low sodium. I told him I will f/u with [facility] . Pt stated Dr Valora Piccolo rechecking sodium next week.

## 2016-01-05 NOTE — Telephone Encounter (Signed)
Will monitor for now

## 2016-01-12 ENCOUNTER — Other Ambulatory Visit: Payer: PPO

## 2016-01-12 ENCOUNTER — Ambulatory Visit: Payer: PPO

## 2016-01-12 ENCOUNTER — Ambulatory Visit: Payer: PPO | Admitting: Internal Medicine

## 2016-01-13 DIAGNOSIS — E871 Hypo-osmolality and hyponatremia: Secondary | ICD-10-CM | POA: Diagnosis not present

## 2016-01-17 ENCOUNTER — Telehealth: Payer: Self-pay

## 2016-01-17 NOTE — Telephone Encounter (Signed)
Pt called asking if he has to fast and if he takes his medications before chemo treatment. Informed him he can eat and to take his medications as normal.

## 2016-01-19 ENCOUNTER — Ambulatory Visit (HOSPITAL_BASED_OUTPATIENT_CLINIC_OR_DEPARTMENT_OTHER): Payer: PPO | Admitting: Nurse Practitioner

## 2016-01-19 ENCOUNTER — Other Ambulatory Visit (HOSPITAL_BASED_OUTPATIENT_CLINIC_OR_DEPARTMENT_OTHER): Payer: PPO

## 2016-01-19 ENCOUNTER — Ambulatory Visit (HOSPITAL_BASED_OUTPATIENT_CLINIC_OR_DEPARTMENT_OTHER): Payer: PPO

## 2016-01-19 DIAGNOSIS — C3491 Malignant neoplasm of unspecified part of right bronchus or lung: Secondary | ICD-10-CM

## 2016-01-19 DIAGNOSIS — C342 Malignant neoplasm of middle lobe, bronchus or lung: Secondary | ICD-10-CM | POA: Diagnosis not present

## 2016-01-19 DIAGNOSIS — C3411 Malignant neoplasm of upper lobe, right bronchus or lung: Secondary | ICD-10-CM

## 2016-01-19 DIAGNOSIS — Z5111 Encounter for antineoplastic chemotherapy: Secondary | ICD-10-CM

## 2016-01-19 LAB — CBC WITH DIFFERENTIAL/PLATELET
BASO%: 0 % (ref 0.0–2.0)
Basophils Absolute: 0 10*3/uL (ref 0.0–0.1)
EOS%: 0 % (ref 0.0–7.0)
Eosinophils Absolute: 0 10*3/uL (ref 0.0–0.5)
HCT: 31.3 % — ABNORMAL LOW (ref 38.4–49.9)
HEMOGLOBIN: 10.9 g/dL — AB (ref 13.0–17.1)
LYMPH%: 7.8 % — AB (ref 14.0–49.0)
MCH: 34.3 pg — ABNORMAL HIGH (ref 27.2–33.4)
MCHC: 34.8 g/dL (ref 32.0–36.0)
MCV: 98.4 fL — ABNORMAL HIGH (ref 79.3–98.0)
MONO#: 1.1 10*3/uL — ABNORMAL HIGH (ref 0.1–0.9)
MONO%: 9.3 % (ref 0.0–14.0)
NEUT%: 82.9 % — ABNORMAL HIGH (ref 39.0–75.0)
NEUTROS ABS: 10.2 10*3/uL — AB (ref 1.5–6.5)
Platelets: 493 10*3/uL — ABNORMAL HIGH (ref 140–400)
RBC: 3.18 10*6/uL — AB (ref 4.20–5.82)
RDW: 13.8 % (ref 11.0–14.6)
WBC: 12.3 10*3/uL — AB (ref 4.0–10.3)
lymph#: 1 10*3/uL (ref 0.9–3.3)

## 2016-01-19 LAB — COMPREHENSIVE METABOLIC PANEL
ALBUMIN: 3.6 g/dL (ref 3.5–5.0)
ALT: 26 U/L (ref 0–55)
AST: 37 U/L — AB (ref 5–34)
Alkaline Phosphatase: 80 U/L (ref 40–150)
Anion Gap: 9 mEq/L (ref 3–11)
BILIRUBIN TOTAL: 0.41 mg/dL (ref 0.20–1.20)
BUN: 13.1 mg/dL (ref 7.0–26.0)
CO2: 23 meq/L (ref 22–29)
Calcium: 10.3 mg/dL (ref 8.4–10.4)
Chloride: 97 mEq/L — ABNORMAL LOW (ref 98–109)
Creatinine: 0.8 mg/dL (ref 0.7–1.3)
EGFR: 83 mL/min/{1.73_m2} — ABNORMAL LOW (ref >90)
Glucose: 152 mg/dl — ABNORMAL HIGH (ref 70–140)
Potassium: 4.4 mEq/L (ref 3.5–5.1)
SODIUM: 130 meq/L — AB (ref 136–145)
TOTAL PROTEIN: 7.1 g/dL (ref 6.4–8.3)

## 2016-01-19 MED ORDER — HEPARIN SOD (PORK) LOCK FLUSH 100 UNIT/ML IV SOLN
500.0000 [IU] | Freq: Once | INTRAVENOUS | Status: AC | PRN
  Administered 2016-01-19: 500 [IU]
  Filled 2016-01-19: qty 5

## 2016-01-19 MED ORDER — PROCHLORPERAZINE MALEATE 10 MG PO TABS
10.0000 mg | ORAL_TABLET | Freq: Once | ORAL | Status: AC
  Administered 2016-01-19: 10 mg via ORAL

## 2016-01-19 MED ORDER — PROCHLORPERAZINE MALEATE 10 MG PO TABS
ORAL_TABLET | ORAL | Status: AC
  Filled 2016-01-19: qty 1

## 2016-01-19 MED ORDER — SODIUM CHLORIDE 0.9 % IV SOLN
Freq: Once | INTRAVENOUS | Status: AC
  Administered 2016-01-19: 11:00:00 via INTRAVENOUS

## 2016-01-19 MED ORDER — SODIUM CHLORIDE 0.9% FLUSH
10.0000 mL | INTRAVENOUS | Status: DC | PRN
  Administered 2016-01-19: 10 mL
  Filled 2016-01-19: qty 10

## 2016-01-19 MED ORDER — PEMETREXED DISODIUM CHEMO INJECTION 500 MG
500.0000 mg/m2 | Freq: Once | INTRAVENOUS | Status: AC
  Administered 2016-01-19: 950 mg via INTRAVENOUS
  Filled 2016-01-19: qty 38

## 2016-01-19 NOTE — Patient Instructions (Signed)
[facility] Discharge Instructions for Patients Receiving Chemotherapy  Today you received the following chemotherapy agents Alimta.  To help prevent nausea and vomiting after your treatment, we encourage you to take your nausea medication as prescribed.   If you develop nausea and vomiting that is not controlled by your nausea medication, call the clinic.   BELOW ARE SYMPTOMS THAT SHOULD BE REPORTED IMMEDIATELY:  *FEVER GREATER THAN 100.5 F  *CHILLS WITH OR WITHOUT FEVER  NAUSEA AND VOMITING THAT IS NOT CONTROLLED WITH YOUR NAUSEA MEDICATION  *UNUSUAL SHORTNESS OF BREATH  *UNUSUAL BRUISING OR BLEEDING  TENDERNESS IN MOUTH AND THROAT WITH OR WITHOUT PRESENCE OF ULCERS  *URINARY PROBLEMS  *BOWEL PROBLEMS  UNUSUAL RASH Items with * indicate a potential emergency and should be followed up as soon as possible.  Feel free to call the clinic you have any questions or concerns. The clinic phone number is [phone].  Please show the CHEMO ALERT CARD at check-in to the Emergency Department and triage nurse.   

## 2016-01-19 NOTE — Progress Notes (Signed)
  [facility] OFFICE PROGRESS NOTE    DIAGNOSIS: Stage IIIB (T1a, N3, M0) non-small cell lung cancer, adenocarcinoma with negative EGFR mutation, negative ALK gene translocation, negative ROS 1, but positive RET diagnosed in November 2016; presented with right middle lobe lung nodule in addition to mediastinal and left supraclavicular lymphadenopathy.  PRIOR THERAPY: Systemic chemotherapy with carboplatin for AUC of 5 and Alimta 500 MG/M2 every 3 weeks, first dose 08/11/2015. Status post 6 cycles with partial response.  CURRENT THERAPY: Maintenance Systemic chemotherapy with single agent Alimta 500 MG/M2 every 3 weeks, first dose 12/29/2015.   INTERVAL HISTORY:   Sean Cooke returns as scheduled. He completed cycle 1 maintenance Alimta 12/29/2015. He denies nausea/vomiting. He notes some tenderness on his gums. No discrete ulcers. Overall able to eat and drink without difficulty. Bowels tend to alternate constipation and diarrhea. No rash. No shortness of breath. No cough. No fever. Stable mild chronic back pain.  Objective:  Vital signs in last 24 hours:  Blood pressure 141/63, pulse 68, temperature 97.7 F (36.5 C), temperature source Oral, resp. rate 17, height _0  (1.727 m), weight 167 lb (75.751 kg), SpO2 98 %.    HEENT: No thrush or ulcers. Lymphatics: No palpable cervical or supraclavicular lymph nodes. Resp: Lungs clear bilaterally. Cardio: Regular rate and rhythm. GI: Abdomen soft and nontender. No hepatomegaly. Vascular: Trace lower leg edema bilaterally. Calves soft and nontender.  Skin: No rash. Port-A-Cath without erythema.    Lab Results:  Lab Results  Component Value Date   WBC 12.3* 01/19/2016   HGB 10.9* 01/19/2016   HCT 31.3* 01/19/2016   MCV 98.4* 01/19/2016   PLT 493* 01/19/2016   NEUTROABS 10.2* 01/19/2016    Imaging:  No results found.  Medications: I have reviewed the patient's current medications.  Assessment/Plan: 1. Stage  IIIB non-small cell lung cancer presenting with right middle lobe lung nodule in addition to mediastinal and left supraclavicular lymphadenopathy; negative EGFR mutation, negative ALK gene translocation and PDL 1 expression 10%. Not eligible for concurrent chemoradiation because of the large radiotherapy field. Status post induction systemic chemotherapy with carboplatin and Alimta x 6 cycles. Restaging CT evaluation 12/08/2015 with residual right middle lobe scarring/atelectasis without discrete nodule. No specific findings for metastatic disease. Maintenance Alimta initiated 12/29/2015.  Disposition: Sean Cooke appears stable. He has completed 1 cycle of maintenance Alimta. Plan to proceed with cycle 2 today as scheduled. He will return for a follow-up visit and cycle 3 in 3 weeks. He will contact the office in the interim with any problems.  Plan reviewed with Dr. Julien Nordmann.    Ned Card ANP/GNP-BC   01/19/2016  10:20 AM

## 2016-02-03 ENCOUNTER — Ambulatory Visit: Payer: PPO | Admitting: Internal Medicine

## 2016-02-03 ENCOUNTER — Other Ambulatory Visit: Payer: PPO

## 2016-02-03 ENCOUNTER — Ambulatory Visit: Payer: PPO

## 2016-02-03 DIAGNOSIS — M810 Age-related osteoporosis without current pathological fracture: Secondary | ICD-10-CM | POA: Diagnosis not present

## 2016-02-09 ENCOUNTER — Ambulatory Visit (HOSPITAL_BASED_OUTPATIENT_CLINIC_OR_DEPARTMENT_OTHER): Payer: PPO

## 2016-02-09 ENCOUNTER — Ambulatory Visit (HOSPITAL_BASED_OUTPATIENT_CLINIC_OR_DEPARTMENT_OTHER): Payer: PPO | Admitting: Internal Medicine

## 2016-02-09 ENCOUNTER — Telehealth: Payer: Self-pay | Admitting: Internal Medicine

## 2016-02-09 ENCOUNTER — Other Ambulatory Visit (HOSPITAL_BASED_OUTPATIENT_CLINIC_OR_DEPARTMENT_OTHER): Payer: PPO

## 2016-02-09 ENCOUNTER — Encounter: Payer: Self-pay | Admitting: *Deleted

## 2016-02-09 ENCOUNTER — Encounter: Payer: Self-pay | Admitting: Internal Medicine

## 2016-02-09 VITALS — BP 160/63 | HR 67 | Temp 97.7°F | Resp 18 | Ht 68.0 in | Wt 168.5 lb

## 2016-02-09 DIAGNOSIS — C3411 Malignant neoplasm of upper lobe, right bronchus or lung: Secondary | ICD-10-CM

## 2016-02-09 DIAGNOSIS — Z5111 Encounter for antineoplastic chemotherapy: Secondary | ICD-10-CM

## 2016-02-09 DIAGNOSIS — K1370 Unspecified lesions of oral mucosa: Secondary | ICD-10-CM

## 2016-02-09 DIAGNOSIS — C342 Malignant neoplasm of middle lobe, bronchus or lung: Secondary | ICD-10-CM

## 2016-02-09 DIAGNOSIS — C3491 Malignant neoplasm of unspecified part of right bronchus or lung: Secondary | ICD-10-CM

## 2016-02-09 DIAGNOSIS — E871 Hypo-osmolality and hyponatremia: Secondary | ICD-10-CM

## 2016-02-09 LAB — CBC WITH DIFFERENTIAL/PLATELET
BASO%: 0.1 % (ref 0.0–2.0)
Basophils Absolute: 0 10*3/uL (ref 0.0–0.1)
EOS%: 0.2 % (ref 0.0–7.0)
Eosinophils Absolute: 0 10*3/uL (ref 0.0–0.5)
HEMATOCRIT: 28.8 % — AB (ref 38.4–49.9)
HEMOGLOBIN: 10 g/dL — AB (ref 13.0–17.1)
LYMPH#: 1.3 10*3/uL (ref 0.9–3.3)
LYMPH%: 10.9 % — ABNORMAL LOW (ref 14.0–49.0)
MCH: 33.7 pg — ABNORMAL HIGH (ref 27.2–33.4)
MCHC: 34.7 g/dL (ref 32.0–36.0)
MCV: 97 fL (ref 79.3–98.0)
MONO#: 2.1 10*3/uL — ABNORMAL HIGH (ref 0.1–0.9)
MONO%: 18.3 % — ABNORMAL HIGH (ref 0.0–14.0)
NEUT%: 70.5 % (ref 39.0–75.0)
NEUTROS ABS: 8.3 10*3/uL — AB (ref 1.5–6.5)
PLATELETS: 500 10*3/uL — AB (ref 140–400)
RBC: 2.97 10*6/uL — AB (ref 4.20–5.82)
RDW: 13.6 % (ref 11.0–14.6)
WBC: 11.7 10*3/uL — ABNORMAL HIGH (ref 4.0–10.3)

## 2016-02-09 LAB — COMPREHENSIVE METABOLIC PANEL
ALBUMIN: 3.2 g/dL — AB (ref 3.5–5.0)
ALK PHOS: 92 U/L (ref 40–150)
ALT: 26 U/L (ref 0–55)
ANION GAP: 8 meq/L (ref 3–11)
AST: 42 U/L — ABNORMAL HIGH (ref 5–34)
BUN: 10.8 mg/dL (ref 7.0–26.0)
CALCIUM: 9.8 mg/dL (ref 8.4–10.4)
CO2: 25 meq/L (ref 22–29)
CREATININE: 0.7 mg/dL (ref 0.7–1.3)
Chloride: 94 mEq/L — ABNORMAL LOW (ref 98–109)
EGFR: 86 mL/min/{1.73_m2} — AB (ref >90)
Glucose: 132 mg/dl (ref 70–140)
Potassium: 4.2 mEq/L (ref 3.5–5.1)
Sodium: 127 mEq/L — ABNORMAL LOW (ref 136–145)
TOTAL PROTEIN: 6.6 g/dL (ref 6.4–8.3)

## 2016-02-09 MED ORDER — PROCHLORPERAZINE MALEATE 10 MG PO TABS
ORAL_TABLET | ORAL | Status: AC
  Filled 2016-02-09: qty 1

## 2016-02-09 MED ORDER — PROCHLORPERAZINE MALEATE 10 MG PO TABS
10.0000 mg | ORAL_TABLET | Freq: Once | ORAL | Status: AC
  Administered 2016-02-09: 10 mg via ORAL

## 2016-02-09 MED ORDER — CYANOCOBALAMIN 1000 MCG/ML IJ SOLN
1000.0000 ug | Freq: Once | INTRAMUSCULAR | Status: AC
  Administered 2016-02-09: 1000 ug via INTRAMUSCULAR

## 2016-02-09 MED ORDER — CYANOCOBALAMIN 1000 MCG/ML IJ SOLN
INTRAMUSCULAR | Status: AC
  Filled 2016-02-09: qty 1

## 2016-02-09 MED ORDER — HEPARIN SOD (PORK) LOCK FLUSH 100 UNIT/ML IV SOLN
500.0000 [IU] | Freq: Once | INTRAVENOUS | Status: AC | PRN
  Administered 2016-02-09: 500 [IU]
  Filled 2016-02-09: qty 5

## 2016-02-09 MED ORDER — SODIUM CHLORIDE 0.9% FLUSH
10.0000 mL | INTRAVENOUS | Status: DC | PRN
  Administered 2016-02-09: 10 mL
  Filled 2016-02-09: qty 10

## 2016-02-09 MED ORDER — SODIUM CHLORIDE 0.9 % IV SOLN
Freq: Once | INTRAVENOUS | Status: AC
  Administered 2016-02-09: 12:00:00 via INTRAVENOUS

## 2016-02-09 MED ORDER — SODIUM CHLORIDE 0.9 % IV SOLN
500.0000 mg/m2 | Freq: Once | INTRAVENOUS | Status: AC
  Administered 2016-02-09: 950 mg via INTRAVENOUS
  Filled 2016-02-09: qty 38

## 2016-02-09 NOTE — Progress Notes (Signed)
[facility] [phone]   [phone]  OFFICE PROGRESS NOTE  [name], MD [address] [place] [address]  DIAGNOSIS: stage IIIB (T1a, N3, M0) non-small cell lung cancer, adenocarcinoma with negative EGFR mutation, negative ALKgene translocation, negative ROS 1, but positive RET diagnosed in November 2016 and presented with right middle lobe lung nodule in addition to mediastinal and left supraclavicular lymphadenopathy.  PRIOR THERAPY: Systemic chemotherapy with carboplatin for AUC of 5 and Alimta 500 MG/M2 every 3 weeks, first dose 08/11/2015. Status post 6 cycles with partial response.  CURRENT THERAPY: Maintenance Systemic chemotherapy with single agent Alimta 500 MG/M2 every 3 weeks, first dose 12/22/2015. Status post 2 cycles.  INTERVAL HISTORY: Sean Cooke 82 y.o. male returns to the clinic today for follow-up visit accompanied by his wife. He is currently on maintenance systemic chemotherapy with single agent Alimta and tolerating it well except for fatigue. He continues to have mild mouth sores and he is currently utilizing magic mouthwash as well as Biotene and salt rinse The patient is feeling much better today. He denied having any significant fever or chills. He has no nausea or vomiting. He denied having any significant chest pain, shortness of breath, cough or hemoptysis. He denied having any significant weight loss or night sweats. He is here today to start cycle #3.  MEDICAL HISTORY: Past Medical History  Diagnosis Date  . GERD (gastroesophageal reflux disease)   . Hiatal hernia   . Degenerative joint disease   . Rib fracture   . Diverticulosis   . Adenomatous colon polyp 05/2003  . Duodenal diverticulum   . Neuritis/radiculitis due to displacement of lumbar intervertebral disc   . Arthritis   . Pneumonia   . Hemorrhoids   . Encounter for antineoplastic chemotherapy 07/31/2015  . Lung cancer ([place])       ALLERGIES:  is allergic to benzonatate; lyrica; penicillins; pneumococcal vaccine polyvalent; tizanidine; and tramadol.  MEDICATIONS:  Current Outpatient Prescriptions  Medication Sig Dispense Refill  . acetaminophen (TYLENOL) 650 MG CR tablet Take 650 mg by mouth every 6 (six) hours as needed for pain or fever. Reported on 09/22/2015    . alum & mag hydroxide-simeth (MAALOX PLUS) 400-400-40 MG/5ML suspension Take 5 mLs by mouth every 6 (six) hours as needed for indigestion. Reported on 11/03/2015    . antiseptic oral rinse (BIOTENE) LIQD 15 mLs by Mouth Rinse route as needed for dry mouth.    Marland Kitchen aspirin 81 MG tablet Take 81 mg by mouth at bedtime.     . Calcium Carbonate-Vitamin D (CALCIUM + D PO) Take 1 capsule by mouth 2 (two) times daily.     . cromolyn (OPTICROM) 4 % ophthalmic solution Reported on 01/11/202017    . denosumab (PROLIA) 60 MG/ML SOLN injection Inject 60 mg into the skin every 6 (six) months. Administer in upper arm, thigh, or abdomen    . dexamethasone (DECADRON) 4 MG tablet 4 mg by mouth twice a day the day before, day of and day after the chemotherapy every 3 weeks 40 tablet 1  . dextromethorphan-guaiFENesin (ROBITUSSIN-DM) 10-100 MG/5ML liquid Take 10 mLs by mouth every 4 (four) hours as needed for cough. Reported on 09/14/3084    . folic acid (FOLVITE) 1 MG tablet TAKE 1 TABLET (1 MG TOTAL) BY MOUTH DAILY. 30 tablet 4  . guaiFENesin (MUCINEX) 600 MG 12 hr tablet Take 600 mg by mouth 2 (two) times daily as needed.    Marland Kitchen  lactobacillus acidophilus & bulgar (LACTINEX) chewable tablet Chew 1 tablet by mouth 2 (two) times daily. Store in [place], [place]    . lidocaine-prilocaine (EMLA) cream Apply 1 application topically as needed. 30 g 3  . loperamide (IMODIUM) 2 MG capsule Take 2 mg by mouth as needed for diarrhea or loose stools. Reported on 10/13/2015    . loratadine (CLARITIN) 10 MG tablet Take 10 mg by mouth.    . losartan (COZAAR) 50 MG tablet Take 50 mg by mouth daily.    . magic  mouthwash SOLN Take 5 mLs by mouth 3 (three) times daily. DUKES  3  . meloxicam (MOBIC) 7.5 MG tablet Take 7.5 mg by mouth every other day. Reported on 07-16-202017    . Multiple Vitamin (MULTIVITAMIN) tablet Take 1 tablet by mouth daily.    . Omega-3 Fatty Acids (FISH OIL) 1000 MG CAPS Take 1,000 mg by mouth daily.     Marland Kitchen oxybutynin (DITROPAN) 5 MG tablet Take 5 mg by mouth daily.    . pantoprazole (PROTONIX) 40 MG tablet Take 40 mg by mouth daily.    . Probiotic Product (ALIGN) 4 MG CAPS Take 1 capsule by mouth daily.    . prochlorperazine (COMPAZINE) 10 MG tablet TAKE 1 TABLET (10 MG TOTAL) BY MOUTH EVERY 6 (SIX) HOURS AS NEEDED FOR NAUSEA OR VOMITING. (Patient not taking: Reported on 12/15/2015) 30 tablet 0  . PROCTOSOL HC 2.5 % rectal cream INSERT INTO THE RECTUM TWO (2) TIMES A DAY.  0  . saw palmetto 160 MG capsule Take 160 mg by mouth daily.    . Tamsulosin HCl (FLOMAX) 0.4 MG CAPS Take 0.4 mg by mouth daily.     No current facility-administered medications for this visit.    SURGICAL HISTORY:  Past Surgical History  Procedure Laterality Date  . Inguinal hernia repair    . Transurethral resection of prostate    . Splenectomy  2002  . Cataract extraction      bilateral  . Cholecystectomy  06/2003    REVIEW OF SYSTEMS:  A comprehensive review of systems was negative except for: Constitutional: positive for fatigue Ears, nose, mouth, throat, and face: positive for sore mouth   PHYSICAL EXAMINATION: General appearance: alert, cooperative, fatigued and no distress Head: Normocephalic, without obvious abnormality, atraumatic Neck: no adenopathy, no JVD, supple, symmetrical, trachea midline and thyroid not enlarged, symmetric, no tenderness/mass/nodules Lymph nodes: Cervical, supraclavicular, and axillary nodes normal. Resp: clear to auscultation bilaterally Back: symmetric, no curvature. ROM normal. No CVA tenderness. Cardio: regular rate and rhythm, S1, S2 normal, no murmur, click,  rub or gallop GI: soft, non-tender; bowel sounds normal; no masses,  no organomegaly Extremities: extremities normal, atraumatic, no cyanosis or edema Neurologic: Alert and oriented X 3, normal strength and tone. Normal symmetric reflexes. Normal coordination and gait  ECOG PERFORMANCE STATUS: 1 - Symptomatic but completely ambulatory  Blood pressure 160/63, pulse 67, temperature 97.7 F (36.5 C), temperature source Oral, resp. rate 18, height 5' 8" (1.727 m), weight 168 lb 8 oz (76.431 kg), SpO2 100 %.  LABORATORY DATA: Lab Results  Component Value Date   WBC 11.7* 02/09/2016   HGB 10.0* 02/09/2016   HCT 28.8* 02/09/2016   MCV 97.0 02/09/2016   PLT 500* 02/09/2016      Chemistry      Component Value Date/Time   NA 130* 01/19/2016 0930   NA 133* 08/30/2015 1150   K 4.4 01/19/2016 0930   K 4.1 08/30/2015 1150  CL 100* 08/30/2015 1150   CO2 23 01/19/2016 0930   CO2 24 08/30/2015 1150   BUN 13.1 01/19/2016 0930   BUN 8 08/30/2015 1150   CREATININE 0.8 01/19/2016 0930   CREATININE 0.55* 08/30/2015 1150      Component Value Date/Time   CALCIUM 10.3 01/19/2016 0930   CALCIUM 9.8 08/30/2015 1150   ALKPHOS 80 01/19/2016 0930   ALKPHOS 62 08/30/2015 1150   AST 37* 01/19/2016 0930   AST 28 08/30/2015 1150   ALT 26 01/19/2016 0930   ALT 25 08/30/2015 1150   BILITOT 0.41 01/19/2016 0930   BILITOT 0.8 08/30/2015 1150       RADIOGRAPHIC STUDIES: No results found.  ASSESSMENT AND PLAN: This is a very pleasant 82 years old white male with a stage IIIB non-small cell lung cancer with negative EGFR mutation, negative ALK gene translocation and PDL 1 expression 10%. The patient is not eligible for concurrent chemoradiation because of the large radiotherapy field. The patient completed induction systemic chemotherapy with carboplatin and Alimta status post 6 cycles. He tolerated his treatment well except for fatigue and sore mouth. He is currently undergoing maintenance systemic  chemotherapy with single agent Alimta status post 2 cycles and tolerating it well. I recommended for the patient to proceed with cycle #3 today as a scheduled. For the hyponatremia, currently stable. I advised the patient to increase salt intake in his diet. I will continue to monitor his sodium closely on upcoming blood work. He would come back for follow-up visit in 3 weeks for reevaluation with repeat CT scan of the chest, abdomen and pelvis for restaging of his disease. He was advised to call immediately if he has any concerning symptoms in the interval. The patient voices understanding of current disease status and treatment options and is in agreement with the current care plan.  All questions were answered. The patient knows to call the clinic with any problems, questions or concerns. We can certainly see the patient much sooner if necessary.  Disclaimer: This note was dictated with voice recognition software. Similar sounding words can inadvertently be transcribed and may not be corrected upon review.

## 2016-02-09 NOTE — Telephone Encounter (Signed)
Gave pt cal & avs °

## 2016-02-09 NOTE — Patient Instructions (Signed)
[facility] Discharge Instructions for Patients Receiving Chemotherapy  Today you received the following chemotherapy agents Alimta.  To help prevent nausea and vomiting after your treatment, we encourage you to take your nausea medication as prescribed.   If you develop nausea and vomiting that is not controlled by your nausea medication, call the clinic.   BELOW ARE SYMPTOMS THAT SHOULD BE REPORTED IMMEDIATELY:  *FEVER GREATER THAN 100.5 F  *CHILLS WITH OR WITHOUT FEVER  NAUSEA AND VOMITING THAT IS NOT CONTROLLED WITH YOUR NAUSEA MEDICATION  *UNUSUAL SHORTNESS OF BREATH  *UNUSUAL BRUISING OR BLEEDING  TENDERNESS IN MOUTH AND THROAT WITH OR WITHOUT PRESENCE OF ULCERS  *URINARY PROBLEMS  *BOWEL PROBLEMS  UNUSUAL RASH Items with * indicate a potential emergency and should be followed up as soon as possible.  Feel free to call the clinic you have any questions or concerns. The clinic phone number is [phone].  Please show the CHEMO ALERT CARD at check-in to the Emergency Department and triage nurse.   

## 2016-02-09 NOTE — Progress Notes (Signed)
Oncology Nurse Navigator Documentation  Oncology Nurse Navigator Flowsheets 02/09/2016  Navigator Location [facility]Med Onc  Patient Visit Type MedOnc  Treatment Phase Treatment  Barriers/Navigation Needs No barriers at this time  Interventions None required  Acuity Level 1  Acuity Level 1 Minimal follow up required  Time Spent with Patient 15   I spoke with Mr. Economou today at clinic.  He is doing well.  He is very active and has no complaints.  No barriers identified at this time.

## 2016-02-15 ENCOUNTER — Encounter (HOSPITAL_COMMUNITY): Payer: Self-pay

## 2016-02-15 ENCOUNTER — Emergency Department (HOSPITAL_COMMUNITY)
Admission: EM | Admit: 2016-02-15 | Discharge: 2016-02-15 | Disposition: A | Discharge status: Home / Self Care | Payer: PPO | Attending: Emergency Medicine | Admitting: Emergency Medicine

## 2016-02-15 DIAGNOSIS — W541XXA Struck by dog, initial encounter: Secondary | ICD-10-CM | POA: Diagnosis not present

## 2016-02-15 DIAGNOSIS — Y929 Unspecified place or not applicable: Secondary | ICD-10-CM | POA: Insufficient documentation

## 2016-02-15 DIAGNOSIS — M1990 Unspecified osteoarthritis, unspecified site: Secondary | ICD-10-CM | POA: Insufficient documentation

## 2016-02-15 DIAGNOSIS — Z7982 Long term (current) use of aspirin: Secondary | ICD-10-CM | POA: Diagnosis not present

## 2016-02-15 DIAGNOSIS — Z87891 Personal history of nicotine dependence: Secondary | ICD-10-CM | POA: Insufficient documentation

## 2016-02-15 DIAGNOSIS — S61411A Laceration without foreign body of right hand, initial encounter: Secondary | ICD-10-CM | POA: Diagnosis not present

## 2016-02-15 DIAGNOSIS — Z85118 Personal history of other malignant neoplasm of bronchus and lung: Secondary | ICD-10-CM | POA: Diagnosis not present

## 2016-02-15 DIAGNOSIS — Y939 Activity, unspecified: Secondary | ICD-10-CM | POA: Insufficient documentation

## 2016-02-15 DIAGNOSIS — Z79899 Other long term (current) drug therapy: Secondary | ICD-10-CM | POA: Insufficient documentation

## 2016-02-15 DIAGNOSIS — Y999 Unspecified external cause status: Secondary | ICD-10-CM | POA: Insufficient documentation

## 2016-02-15 MED ORDER — TETANUS-DIPHTHERIA TOXOIDS TD 5-2 LFU IM INJ
0.5000 mL | INJECTION | Freq: Once | INTRAMUSCULAR | Status: DC
  Filled 2016-02-15: qty 0.5

## 2016-02-15 MED ORDER — CLINDAMYCIN HCL 150 MG PO CAPS: 150.0000 mg | ORAL_CAPSULE | Freq: Four times a day (QID) | ORAL | Status: DC

## 2016-02-15 MED ORDER — TETANUS-DIPHTH-ACELL PERTUSSIS 5-2.5-18.5 LF-MCG/0.5 IM SUSP
0.5000 mL | Freq: Once | INTRAMUSCULAR | Status: AC
  Administered 2016-02-15: 0.5 mL via INTRAMUSCULAR
  Filled 2016-02-15: qty 0.5

## 2016-02-15 NOTE — ED Provider Notes (Signed)
CSN: [id number]     Arrival date & time 02/15/16  1540 History   First MD Initiated Contact with Patient 02/15/16 [phone]     Chief Complaint  Patient presents with  . Extremity Laceration     (Consider location/radiation/quality/duration/timing/severity/associated sxs/prior Treatment) HPI Comments: Patient here after sustaining a animal laceration to his right dorsal surface of his hand yesterday. States that this was his dog and that he does not have any numbness or tingling to his right hand. The bleeding has been controlled with direct pressure. No fever or chills.  The history is provided by the patient.    Past Medical History  Diagnosis Date  . GERD (gastroesophageal reflux disease)   . Hiatal hernia   . Degenerative joint disease   . Rib fracture   . Diverticulosis   . Adenomatous colon polyp 05/2003  . Duodenal diverticulum   . Neuritis/radiculitis due to displacement of lumbar intervertebral disc   . Arthritis   . Pneumonia   . Hemorrhoids   . Encounter for antineoplastic chemotherapy 07/31/2015  . Lung cancer [facility])    Past Surgical History  Procedure Laterality Date  . Inguinal hernia repair    . Transurethral resection of prostate    . Splenectomy  2002  . Cataract extraction      bilateral  . Cholecystectomy  06/2003   Family History  Problem Relation Age of Onset  . Diabetes Father   . Colon cancer Neg Hx    Social History  Substance Use Topics  . Smoking status: Former Smoker    Types: Cigarettes, Pipe    Quit date: 08/14/1988  . Smokeless tobacco: Never Used  . Alcohol Use: 0.0 oz/week    0 Standard drinks or equivalent per week     Comment: rarely wine    Review of Systems  All other systems reviewed and are negative.     Allergies  Benzonatate; Lyrica; Penicillins; Pneumococcal vaccine polyvalent; Tizanidine; and Tramadol  Home Medications   Prior to Admission medications   Medication Sig Start Date End Date Taking? Authorizing  Provider  acetaminophen (TYLENOL) 650 MG CR tablet Take 650 mg by mouth every 6 (six) hours as needed for pain or fever. Reported on 09/22/2015    Historical Provider, MD  alum & mag hydroxide-simeth (MAALOX PLUS) 400-400-40 MG/5ML suspension Take 5 mLs by mouth every 6 (six) hours as needed for indigestion. Reported on 11/03/2015    Historical Provider, MD  antiseptic oral rinse (BIOTENE) LIQD 15 mLs by Mouth Rinse route as needed for dry mouth.    Historical Provider, MD  aspirin 81 MG tablet Take 81 mg by mouth at bedtime.     Historical Provider, MD  Calcium Carbonate-Vitamin D (CALCIUM + D PO) Take 1 capsule by mouth 2 (two) times daily.     Historical Provider, MD  cromolyn (OPTICROM) 4 % ophthalmic solution Reported on 2020-07-2016    Historical Provider, MD  denosumab (PROLIA) 60 MG/ML SOLN injection Inject 60 mg into the skin every 6 (six) months. Administer in upper arm, thigh, or abdomen    Historical Provider, MD  dexamethasone (DECADRON) 4 MG tablet 4 mg by mouth twice a day the day before, day of and day after the chemotherapy every 3 weeks 07/30/15   Curt Bears, MD  dextromethorphan-guaiFENesin (ROBITUSSIN-DM) 10-100 MG/5ML liquid Take 10 mLs by mouth every 4 (four) hours as needed for cough. Reported on 01/19/2016    Historical Provider, MD  folic acid (FOLVITE) 1 MG tablet  TAKE 1 TABLET (1 MG TOTAL) BY MOUTH DAILY. 12/28/15   Curt Bears, MD  guaiFENesin (MUCINEX) 600 MG 12 hr tablet Take 600 mg by mouth 2 (two) times daily as needed.    Historical Provider, MD  lactobacillus acidophilus & bulgar (LACTINEX) chewable tablet Chew 1 tablet by mouth 2 (two) times daily. Store in [place], [place]    Historical Provider, MD  lidocaine-prilocaine (EMLA) cream Apply 1 application topically as needed. 09/27/15   Curt Bears, MD  loperamide (IMODIUM) 2 MG capsule Take 2 mg by mouth as needed for diarrhea or loose stools. Reported on 10/13/2015    Historical Provider, MD  loratadine (CLARITIN) 10 MG  tablet Take 10 mg by mouth.    Historical Provider, MD  losartan (COZAAR) 50 MG tablet Take 50 mg by mouth daily.    Historical Provider, MD  magic mouthwash SOLN Take 5 mLs by mouth 3 (three) times daily. DUKES 12/03/15   Historical Provider, MD  meloxicam (MOBIC) 7.5 MG tablet Take 7.5 mg by mouth every other day. Reported on 10/27/2015    Historical Provider, MD  Multiple Vitamin (MULTIVITAMIN) tablet Take 1 tablet by mouth daily.    Historical Provider, MD  Omega-3 Fatty Acids (FISH OIL) 1000 MG CAPS Take 1,000 mg by mouth daily.     Historical Provider, MD  oxybutynin (DITROPAN) 5 MG tablet Take 5 mg by mouth daily.    Historical Provider, MD  pantoprazole (PROTONIX) 40 MG tablet Take 40 mg by mouth daily.    Historical Provider, MD  Probiotic Product (ALIGN) 4 MG CAPS Take 1 capsule by mouth daily.    Historical Provider, MD  prochlorperazine (COMPAZINE) 10 MG tablet TAKE 1 TABLET (10 MG TOTAL) BY MOUTH EVERY 6 (SIX) HOURS AS NEEDED FOR NAUSEA OR VOMITING. 09/02/15   Curt Bears, MD  PROCTOSOL HC 2.5 % rectal cream INSERT INTO THE RECTUM TWO (2) TIMES A DAY. 01/12/16   Historical Provider, MD  saw palmetto 160 MG capsule Take 160 mg by mouth daily.    Historical Provider, MD  Tamsulosin HCl (FLOMAX) 0.4 MG CAPS Take 0.4 mg by mouth daily.    Historical Provider, MD   BP 146/72 mmHg  Pulse 89  Temp(Src) 99.1 F (37.3 C) (Oral)  Resp 14  SpO2 97% Physical Exam  Constitutional: He is oriented to person, place, and time. He appears well-developed and well-nourished.  Non-toxic appearance.  HENT:  Head: Normocephalic and atraumatic.  Eyes: Conjunctivae are normal. Pupils are equal, round, and reactive to light.  Neck: Normal range of motion.  Cardiovascular: Normal rate.   Pulmonary/Chest: Effort normal.  Musculoskeletal:       Hands: Neurological: He is alert and oriented to person, place, and time.  Skin: Skin is warm and dry.  Psychiatric: He has a normal mood and affect.    Nursing note and vitals reviewed.   ED Course  Procedures (including critical care time) Labs Review Labs Reviewed - No data to display  Imaging Review No results found. I have personally reviewed and evaluated these images and lab results as part of my medical decision-making.   EKG Interpretation None      MDM   Final diagnoses:  None    Patient's tetanus status updated. Wound cleaned and dressed by me. Will place on antibiotics and return precautions given    Lacretia Leigh, MD 02/15/16 [phone]

## 2016-02-15 NOTE — ED Notes (Signed)
Pt has skin tear on left hand from animal.  Pet jumped off of him last night and the animals nails caught patients skin.

## 2016-02-15 NOTE — Discharge Instructions (Signed)
Remove the bandage in 2 days. Return here for severe bleeding Nonsutured Laceration Care A laceration is a cut that goes through all layers of the skin and extends into the tissue that is right under the skin. This type of cut is usually stitched up (sutured) or closed with tape (adhesive strips) or skin glue shortly after the injury happens. However, if the wound is dirty or if several hours pass before medical treatment is provided, it is likely that germs (bacteria) will enter the wound. Closing a laceration after bacteria have entered it increases the risk of infection. In these cases, your health care provider may leave the laceration open (nonsutured) and cover it with a bandage. This type of treatment helps prevent infection and allows the wound to heal from the deepest layer of tissue damage up to the surface. An open fracture is a type of injury that may involve nonsutured lacerations. An open fracture is a break in a bone that happens along with one or more lacerations through the skin that is near the fracture site. HOW TO CARE FOR YOUR NONSUTURED LACERATION  Take or apply over-the-counter and prescription medicines only as told by your health care provider.  If you were prescribed an antibiotic medicine, take or apply it as told by your health care provider. Do not stop using the antibiotic even if your condition improves.  Clean the wound one time each day or as told by your health care provider.  Wash the wound with mild soap and water.  Rinse the wound with water to remove all soap.  Pat your wound dry with a clean towel. Do not rub the wound.  Do not inject anything into the wound unless your health care provider told you to.  Change any bandages (dressings) as told by your health care provider. This includes changing the dressing if it gets wet, dirty, or starts to smell bad.  Keep the dressing dry until your health care provider says it can be removed. Do not take baths,  swim, or do anything that puts your wound underwater until your health care provider approves.  Raise (elevate) the injured area above the level of your heart while you are sitting or lying down, if possible.  Do not scratch or pick at the wound.  Check your wound every day for signs of infection. Watch for:  Redness, swelling, or pain.  Fluid, blood, or pus.  Keep all follow-up visits as told by your health care provider. This is important. SEEK MEDICAL CARE IF:  You received a tetanus and shot and you have swelling, severe pain, redness, or bleeding at the injection site.   You have a fever.  Your pain is not controlled with medicine.  You have increased redness, swelling, or pain at the site of your wound.  You have fluid, blood, or pus coming from your wound.  You notice a bad smell coming from your wound or your dressing.  You notice something coming out of the wound, such as wood or glass.  You notice a change in the color of your skin near your wound.  You develop a new rash.  You need to change the dressing frequently due to fluid, blood, or pus draining from the wound.  You develop numbness around your wound. SEEK IMMEDIATE MEDICAL CARE IF:  Your pain suddenly increases and is severe.  You develop severe swelling around the wound.  The wound is on your hand or foot and you cannot properly move a  finger or toe.  The wound is on your hand or foot and you notice that your fingers or toes look pale or bluish.  You have a red streak going away from your wound.   This information is not intended to replace advice given to you by your health care provider. Make sure you discuss any questions you have with your health care provider.   Document Released: 06/28/2006 Document Revised: 12/15/2014 Document Reviewed: 07/27/2014 Elsevier Interactive Patient Education [employer].

## 2016-02-17 DIAGNOSIS — S61412D Laceration without foreign body of left hand, subsequent encounter: Secondary | ICD-10-CM | POA: Diagnosis not present

## 2016-02-21 ENCOUNTER — Other Ambulatory Visit: Payer: Self-pay | Admitting: Medical Oncology

## 2016-02-21 DIAGNOSIS — S61412D Laceration without foreign body of left hand, subsequent encounter: Secondary | ICD-10-CM | POA: Diagnosis not present

## 2016-02-21 NOTE — Progress Notes (Signed)
Pt calls and states he was told by radiology that his scan is not authorized . Call sent to [place].

## 2016-02-23 ENCOUNTER — Ambulatory Visit: Payer: PPO | Admitting: Nurse Practitioner

## 2016-02-23 ENCOUNTER — Ambulatory Visit: Payer: PPO

## 2016-02-23 ENCOUNTER — Other Ambulatory Visit: Payer: PPO

## 2016-02-28 ENCOUNTER — Ambulatory Visit (HOSPITAL_COMMUNITY)
Admission: RE | Admit: 2016-02-28 | Discharge: 2016-02-28 | Disposition: A | Discharge status: Home / Self Care | Payer: PPO | Source: Ambulatory Visit | Attending: Internal Medicine | Admitting: Internal Medicine

## 2016-02-28 ENCOUNTER — Encounter (HOSPITAL_COMMUNITY): Payer: Self-pay

## 2016-02-28 DIAGNOSIS — Z9221 Personal history of antineoplastic chemotherapy: Secondary | ICD-10-CM | POA: Insufficient documentation

## 2016-02-28 DIAGNOSIS — I313 Pericardial effusion (noninflammatory): Secondary | ICD-10-CM | POA: Diagnosis not present

## 2016-02-28 DIAGNOSIS — Z5111 Encounter for antineoplastic chemotherapy: Secondary | ICD-10-CM

## 2016-02-28 DIAGNOSIS — I2510 Atherosclerotic heart disease of native coronary artery without angina pectoris: Secondary | ICD-10-CM | POA: Insufficient documentation

## 2016-02-28 DIAGNOSIS — C3411 Malignant neoplasm of upper lobe, right bronchus or lung: Secondary | ICD-10-CM | POA: Diagnosis not present

## 2016-02-28 DIAGNOSIS — I700 Atherosclerosis of aorta: Secondary | ICD-10-CM | POA: Insufficient documentation

## 2016-02-28 DIAGNOSIS — C3491 Malignant neoplasm of unspecified part of right bronchus or lung: Secondary | ICD-10-CM | POA: Diagnosis not present

## 2016-02-28 MED ORDER — IOPAMIDOL (ISOVUE-300) INJECTION 61%
100.0000 mL | Freq: Once | INTRAVENOUS | Status: AC | PRN
  Administered 2016-02-28: 100 mL via INTRAVENOUS

## 2016-03-01 ENCOUNTER — Other Ambulatory Visit: Payer: Self-pay | Admitting: Medical Oncology

## 2016-03-01 ENCOUNTER — Ambulatory Visit (HOSPITAL_BASED_OUTPATIENT_CLINIC_OR_DEPARTMENT_OTHER): Payer: PPO | Admitting: Internal Medicine

## 2016-03-01 ENCOUNTER — Encounter: Payer: Self-pay | Admitting: Internal Medicine

## 2016-03-01 ENCOUNTER — Ambulatory Visit (HOSPITAL_BASED_OUTPATIENT_CLINIC_OR_DEPARTMENT_OTHER): Payer: PPO

## 2016-03-01 ENCOUNTER — Other Ambulatory Visit (HOSPITAL_BASED_OUTPATIENT_CLINIC_OR_DEPARTMENT_OTHER): Payer: PPO

## 2016-03-01 VITALS — BP 181/88 | HR 71 | Temp 98.7°F | Resp 18 | Ht 68.0 in | Wt 166.6 lb

## 2016-03-01 DIAGNOSIS — I10 Essential (primary) hypertension: Secondary | ICD-10-CM

## 2016-03-01 DIAGNOSIS — Z5111 Encounter for antineoplastic chemotherapy: Secondary | ICD-10-CM | POA: Diagnosis not present

## 2016-03-01 DIAGNOSIS — C342 Malignant neoplasm of middle lobe, bronchus or lung: Secondary | ICD-10-CM

## 2016-03-01 DIAGNOSIS — C3411 Malignant neoplasm of upper lobe, right bronchus or lung: Secondary | ICD-10-CM

## 2016-03-01 DIAGNOSIS — C3491 Malignant neoplasm of unspecified part of right bronchus or lung: Secondary | ICD-10-CM

## 2016-03-01 LAB — CBC WITH DIFFERENTIAL/PLATELET
BASO%: 0.4 % (ref 0.0–2.0)
Basophils Absolute: 0 10*3/uL (ref 0.0–0.1)
EOS%: 1.6 % (ref 0.0–7.0)
Eosinophils Absolute: 0.1 10*3/uL (ref 0.0–0.5)
HCT: 31.3 % — ABNORMAL LOW (ref 38.4–49.9)
HGB: 10.5 g/dL — ABNORMAL LOW (ref 13.0–17.1)
LYMPH%: 9.7 % — AB (ref 14.0–49.0)
MCH: 33.3 pg (ref 27.2–33.4)
MCHC: 33.5 g/dL (ref 32.0–36.0)
MCV: 99.3 fL — AB (ref 79.3–98.0)
MONO#: 0.7 10*3/uL (ref 0.1–0.9)
MONO%: 8.9 % (ref 0.0–14.0)
NEUT%: 79.4 % — AB (ref 39.0–75.0)
NEUTROS ABS: 5.9 10*3/uL (ref 1.5–6.5)
PLATELETS: 607 10*3/uL — AB (ref 140–400)
RBC: 3.15 10*6/uL — AB (ref 4.20–5.82)
RDW: 15.1 % — ABNORMAL HIGH (ref 11.0–14.6)
WBC: 7.4 10*3/uL (ref 4.0–10.3)
lymph#: 0.7 10*3/uL — ABNORMAL LOW (ref 0.9–3.3)

## 2016-03-01 LAB — COMPREHENSIVE METABOLIC PANEL
ALK PHOS: 84 U/L (ref 40–150)
ALT: 28 U/L (ref 0–55)
ANION GAP: 7 meq/L (ref 3–11)
AST: 50 U/L — ABNORMAL HIGH (ref 5–34)
Albumin: 3.2 g/dL — ABNORMAL LOW (ref 3.5–5.0)
BILIRUBIN TOTAL: 0.42 mg/dL (ref 0.20–1.20)
BUN: 7.8 mg/dL (ref 7.0–26.0)
CHLORIDE: 96 meq/L — AB (ref 98–109)
CO2: 27 meq/L (ref 22–29)
CREATININE: 0.7 mg/dL (ref 0.7–1.3)
Calcium: 9.8 mg/dL (ref 8.4–10.4)
EGFR: 87 mL/min/{1.73_m2} — AB (ref >90)
GLUCOSE: 103 mg/dL (ref 70–140)
Potassium: 4.3 mEq/L (ref 3.5–5.1)
SODIUM: 130 meq/L — AB (ref 136–145)
TOTAL PROTEIN: 6.6 g/dL (ref 6.4–8.3)

## 2016-03-01 MED ORDER — SODIUM CHLORIDE 0.9 % IV SOLN
Freq: Once | INTRAVENOUS | Status: AC
  Administered 2016-03-01: 12:00:00 via INTRAVENOUS

## 2016-03-01 MED ORDER — HEPARIN SOD (PORK) LOCK FLUSH 100 UNIT/ML IV SOLN
500.0000 [IU] | Freq: Once | INTRAVENOUS | Status: AC | PRN
Start: 2016-03-01 — End: 2016-03-01
  Administered 2016-03-01: 500 [IU]
  Filled 2016-03-01: qty 5

## 2016-03-01 MED ORDER — SODIUM CHLORIDE 0.9 % IV SOLN
500.0000 mg/m2 | Freq: Once | INTRAVENOUS | Status: AC
  Administered 2016-03-01: 950 mg via INTRAVENOUS
  Filled 2016-03-01: qty 38

## 2016-03-01 MED ORDER — PROCHLORPERAZINE MALEATE 10 MG PO TABS
10.0000 mg | ORAL_TABLET | Freq: Once | ORAL | Status: AC
  Administered 2016-03-01: 10 mg via ORAL

## 2016-03-01 MED ORDER — PROCHLORPERAZINE MALEATE 10 MG PO TABS
ORAL_TABLET | ORAL | Status: AC
  Filled 2016-03-01: qty 1

## 2016-03-01 MED ORDER — SODIUM CHLORIDE 0.9% FLUSH
10.0000 mL | INTRAVENOUS | Status: DC | PRN
  Administered 2016-03-01: 10 mL
  Filled 2016-03-01: qty 10

## 2016-03-01 NOTE — Progress Notes (Signed)
[facility] [phone]   [phone]  OFFICE PROGRESS NOTE  [name], MD [address] [place] [address]  DIAGNOSIS: stage IIIB (T1a, N3, M0) non-small cell lung cancer, adenocarcinoma with negative EGFR mutation, negative ALKgene translocation, negative ROS 1, but positive RET diagnosed in November 2016 and presented with right middle lobe lung nodule in addition to mediastinal and left supraclavicular lymphadenopathy.  PRIOR THERAPY: Systemic chemotherapy with carboplatin for AUC of 5 and Alimta 500 MG/M2 every 3 weeks, first dose 08/11/2015. Status post 6 cycles with partial response.  CURRENT THERAPY: Maintenance Systemic chemotherapy with single agent Alimta 500 MG/M2 every 3 weeks, first dose 12/22/2015. Status post 3 cycles.  INTERVAL HISTORY: Sean Cooke 82 y.o. male returns to the clinic today for follow-up visit. The patient is feeling fine today with no specific complaints. He is currently on maintenance systemic chemotherapy with single agent Alimta status post 3 cycles and tolerating it well except for fatigue. He continues to have mild mouth sores and he is currently utilizing magic mouthwash as well as Biotene and salt rinse The patient is feeling much better today. He denied having any significant fever or chills. He has no nausea or vomiting. He denied having any significant chest pain, shortness of breath, cough or hemoptysis. He denied having any significant weight loss or night sweats. He had repeat CT scan of the chest performed recently and he is here for evaluation and discussion of his scan results before starting cycle #4.  MEDICAL HISTORY: Past Medical History  Diagnosis Date  . GERD (gastroesophageal reflux disease)   . Hiatal hernia   . Degenerative joint disease   . Rib fracture   . Diverticulosis   . Adenomatous colon polyp 05/2003  . Duodenal diverticulum   . Neuritis/radiculitis due to  displacement of lumbar intervertebral disc   . Arthritis   . Pneumonia   . Hemorrhoids   . Encounter for antineoplastic chemotherapy 07/31/2015  . Lung cancer ([place])     ALLERGIES:  is allergic to benzonatate; lyrica; penicillins; pneumococcal vaccine polyvalent; tizanidine; and tramadol.  MEDICATIONS:  Current Outpatient Prescriptions  Medication Sig Dispense Refill  . acetaminophen (TYLENOL) 650 MG CR tablet Take 650 mg by mouth every 6 (six) hours as needed for pain or fever. Reported on 09/22/2015    . alum & mag hydroxide-simeth (MAALOX PLUS) 400-400-40 MG/5ML suspension Take 5 mLs by mouth every 6 (six) hours as needed for indigestion. Reported on 11/03/2015    . antiseptic oral rinse (BIOTENE) LIQD 15 mLs by Mouth Rinse route as needed for dry mouth.    Marland Kitchen aspirin 81 MG tablet Take 81 mg by mouth at bedtime.     . Calcium Carbonate-Vitamin D (CALCIUM + D PO) Take 1 capsule by mouth 2 (two) times daily.     . cromolyn (OPTICROM) 4 % ophthalmic solution Reported on 10/27/2015    . denosumab (PROLIA) 60 MG/ML SOLN injection Inject 60 mg into the skin every 6 (six) months. Administer in upper arm, thigh, or abdomen    . dexamethasone (DECADRON) 4 MG tablet 4 mg by mouth twice a day the day before, day of and day after the chemotherapy every 3 weeks 40 tablet 1  . ferrous sulfate 325 (65 FE) MG tablet Take 325 mg by mouth 2 (two) times daily with a meal.    . folic acid (FOLVITE) 1 MG tablet TAKE 1 TABLET (1 MG TOTAL) BY  MOUTH DAILY. 30 tablet 4  . guaiFENesin (MUCINEX) 600 MG 12 hr tablet Take 600 mg by mouth 2 (two) times daily as needed.    . lactobacillus acidophilus & bulgar (LACTINEX) chewable tablet Chew 1 tablet by mouth 2 (two) times daily. Store in [place], [place]    . lidocaine-prilocaine (EMLA) cream Apply 1 application topically as needed. 30 g 3  . loratadine (CLARITIN) 10 MG tablet Take 10 mg by mouth.    . losartan (COZAAR) 50 MG tablet Take 50 mg by mouth daily.    . magic  mouthwash SOLN Take 5 mLs by mouth 3 (three) times daily. DUKES  3  . meloxicam (MOBIC) 7.5 MG tablet Take 7.5 mg by mouth every other day. Reported on 10/27/2015    . Multiple Vitamin (MULTIVITAMIN) tablet Take 1 tablet by mouth daily.    . Omega-3 Fatty Acids (FISH OIL) 1000 MG CAPS Take 1,000 mg by mouth daily.     Marland Kitchen oxybutynin (DITROPAN) 5 MG tablet Take 5 mg by mouth daily.    . pantoprazole (PROTONIX) 40 MG tablet Take 40 mg by mouth daily.    . polyethylene glycol (MIRALAX / GLYCOLAX) packet Take by mouth.    . Probiotic Product (ALIGN) 4 MG CAPS Take 1 capsule by mouth daily.    . prochlorperazine (COMPAZINE) 10 MG tablet TAKE 1 TABLET (10 MG TOTAL) BY MOUTH EVERY 6 (SIX) HOURS AS NEEDED FOR NAUSEA OR VOMITING. 30 tablet 0  . PROCTOSOL HC 2.5 % rectal cream INSERT INTO THE RECTUM TWO (2) TIMES A DAY.  0  . saw palmetto 160 MG capsule Take 160 mg by mouth daily.    . Tamsulosin HCl (FLOMAX) 0.4 MG CAPS Take 0.4 mg by mouth daily.    Marland Kitchen dextromethorphan-guaiFENesin (ROBITUSSIN-DM) 10-100 MG/5ML liquid Take 10 mLs by mouth every 4 (four) hours as needed for cough. Reported on 03/01/2016    . loperamide (IMODIUM) 2 MG capsule Take 2 mg by mouth as needed for diarrhea or loose stools. Reported on 03/01/2016     No current facility-administered medications for this visit.    SURGICAL HISTORY:  Past Surgical History  Procedure Laterality Date  . Inguinal hernia repair    . Transurethral resection of prostate    . Splenectomy  2002  . Cataract extraction      bilateral  . Cholecystectomy  06/2003    REVIEW OF SYSTEMS:  Constitutional: positive for fatigue Eyes: negative Ears, nose, mouth, throat, and face: negative Respiratory: negative Cardiovascular: negative Gastrointestinal: negative Genitourinary:negative Integument/breast: negative Hematologic/lymphatic: negative Musculoskeletal:negative Neurological: negative Behavioral/Psych: negative Endocrine:  negative Allergic/Immunologic: negative   PHYSICAL EXAMINATION: General appearance: alert, cooperative, fatigued and no distress Head: Normocephalic, without obvious abnormality, atraumatic Neck: no adenopathy, no JVD, supple, symmetrical, trachea midline and thyroid not enlarged, symmetric, no tenderness/mass/nodules Lymph nodes: Cervical, supraclavicular, and axillary nodes normal. Resp: clear to auscultation bilaterally Back: symmetric, no curvature. ROM normal. No CVA tenderness. Cardio: regular rate and rhythm, S1, S2 normal, no murmur, click, rub or gallop GI: soft, non-tender; bowel sounds normal; no masses,  no organomegaly Extremities: extremities normal, atraumatic, no cyanosis or edema Neurologic: Alert and oriented X 3, normal strength and tone. Normal symmetric reflexes. Normal coordination and gait  ECOG PERFORMANCE STATUS: 1 - Symptomatic but completely ambulatory  Blood pressure 181/88, pulse 71, temperature 98.7 F (37.1 C), temperature source Oral, resp. rate 18, height 5' 8"  (1.727 m), weight 166 lb 9.6 oz (75.569 kg), SpO2 99 %.  LABORATORY DATA:  Lab Results  Component Value Date   WBC 7.4 03/01/2016   HGB 10.5* 03/01/2016   HCT 31.3* 03/01/2016   MCV 99.3* 03/01/2016   PLT 607* 03/01/2016      Chemistry      Component Value Date/Time   NA 127* 02/09/2016 1033   NA 133* 08/30/2015 1150   K 4.2 02/09/2016 1033   K 4.1 08/30/2015 1150   CL 100* 08/30/2015 1150   CO2 25 02/09/2016 1033   CO2 24 08/30/2015 1150   BUN 10.8 02/09/2016 1033   BUN 8 08/30/2015 1150   CREATININE 0.7 02/09/2016 1033   CREATININE 0.55* 08/30/2015 1150      Component Value Date/Time   CALCIUM 9.8 02/09/2016 1033   CALCIUM 9.8 08/30/2015 1150   ALKPHOS 92 02/09/2016 1033   ALKPHOS 62 08/30/2015 1150   AST 42* 02/09/2016 1033   AST 28 08/30/2015 1150   ALT 26 02/09/2016 1033   ALT 25 08/30/2015 1150   BILITOT <0.30 02/09/2016 1033   BILITOT 0.8 08/30/2015 1150        RADIOGRAPHIC STUDIES: Ct Chest W Contrast  02/28/2016  CLINICAL DATA:  Followup right lung cancer. EXAM: CT CHEST, ABDOMEN, AND PELVIS WITH CONTRAST TECHNIQUE: Multidetector CT imaging of the chest, abdomen and pelvis was performed following the standard protocol during bolus administration of intravenous contrast. CONTRAST:  13m ISOVUE-300 IOPAMIDOL (ISOVUE-300) INJECTION 61% COMPARISON:  12/08/2015 FINDINGS: CT CHEST FINDINGS Mediastinum/Lymph Nodes: There is a normal heart size. Stable small pericardial effusion. Aortic atherosclerosis noted. Calcification involving be RCA, LAD and left circumflex coronary artery is identified. No mediastinal or hilar adenopathy identified. Lungs/Pleura: No pleural fluid. Postsurgical changes including scarring within the right middle lobe are stable. No suspicious pulmonary nodule or mass identified. Musculoskeletal: No aggressive lytic or sclerotic bone lesion. CT ABDOMEN PELVIS FINDINGS Hepatobiliary: Low density structure along the dome of liver measures 10 mm and is unchanged from previous exam, image 43 of series 2. Previous cholecystectomy. No biliary dilatation. Pancreas: Normal appearance of the pancreas. Spleen: Previous splenectomy. Splenules are identified within the left upper quadrant of the abdomen. Adrenals/Urinary Tract: The adrenal glands are normal. Small right renal cysts noted measuring 1.2 cm, image 67 of series 2. Stone within the inferior pole of the left kidney measures 6 mm, image 72 of series 2. The urinary bladder appears normal Stomach/Bowel: No evidence of obstruction, inflammatory process, or abnormal fluid collections. Numerous distal colonic diverticula identified without acute inflammation. Vascular/Lymphatic: Calcified atherosclerotic disease involves the abdominal aorta. No aneurysm. No enlarged retroperitoneal or mesenteric adenopathy. No enlarged pelvic or inguinal lymph nodes. Reproductive: No mass or other significant  abnormality. Other: Supraumbilical ventral abdominal wall hernia is noted which contain fat only, image 66 of series 2. Musculoskeletal: There are is degenerative changes involving both hip joints. Multi level degenerative disc disease noted within the lumbar spine. IMPRESSION: 1. Stable CT of the chest. 2. Postsurgical changes involving the right middle lobe noted. No specific findings to suggest local tumor recurrence or metastatic disease. 3. Small pericardial effusion 4. Aortic atherosclerosis and coronary artery calcifications. Electronically Signed   By: TKerby MoorsM.D.   On: 02/28/2016 14:20   Ct Abdomen Pelvis W Contrast  02/28/2016  CLINICAL DATA:  Followup right lung cancer. EXAM: CT CHEST, ABDOMEN, AND PELVIS WITH CONTRAST TECHNIQUE: Multidetector CT imaging of the chest, abdomen and pelvis was performed following the standard protocol during bolus administration of intravenous contrast. CONTRAST:  1077mISOVUE-300 IOPAMIDOL (ISOVUE-300) INJECTION 61% COMPARISON:  12/08/2015 FINDINGS: CT CHEST FINDINGS Mediastinum/Lymph Nodes: There is a normal heart size. Stable small pericardial effusion. Aortic atherosclerosis noted. Calcification involving be RCA, LAD and left circumflex coronary artery is identified. No mediastinal or hilar adenopathy identified. Lungs/Pleura: No pleural fluid. Postsurgical changes including scarring within the right middle lobe are stable. No suspicious pulmonary nodule or mass identified. Musculoskeletal: No aggressive lytic or sclerotic bone lesion. CT ABDOMEN PELVIS FINDINGS Hepatobiliary: Low density structure along the dome of liver measures 10 mm and is unchanged from previous exam, image 43 of series 2. Previous cholecystectomy. No biliary dilatation. Pancreas: Normal appearance of the pancreas. Spleen: Previous splenectomy. Splenules are identified within the left upper quadrant of the abdomen. Adrenals/Urinary Tract: The adrenal glands are normal. Small right renal  cysts noted measuring 1.2 cm, image 67 of series 2. Stone within the inferior pole of the left kidney measures 6 mm, image 72 of series 2. The urinary bladder appears normal Stomach/Bowel: No evidence of obstruction, inflammatory process, or abnormal fluid collections. Numerous distal colonic diverticula identified without acute inflammation. Vascular/Lymphatic: Calcified atherosclerotic disease involves the abdominal aorta. No aneurysm. No enlarged retroperitoneal or mesenteric adenopathy. No enlarged pelvic or inguinal lymph nodes. Reproductive: No mass or other significant abnormality. Other: Supraumbilical ventral abdominal wall hernia is noted which contain fat only, image 66 of series 2. Musculoskeletal: There are is degenerative changes involving both hip joints. Multi level degenerative disc disease noted within the lumbar spine. IMPRESSION: 1. Stable CT of the chest. 2. Postsurgical changes involving the right middle lobe noted. No specific findings to suggest local tumor recurrence or metastatic disease. 3. Small pericardial effusion 4. Aortic atherosclerosis and coronary artery calcifications. Electronically Signed   By: Kerby Moors M.D.   On: 02/28/2016 14:20    ASSESSMENT AND PLAN: This is a very pleasant 82 years old white male with a stage IIIB non-small cell lung cancer with negative EGFR mutation, negative ALK gene translocation and PDL 1 expression 10%. The patient is not eligible for concurrent chemoradiation because of the large radiotherapy field. The patient completed induction systemic chemotherapy with carboplatin and Alimta status post 6 cycles. He tolerated his treatment well except for fatigue and sore mouth. He is currently undergoing maintenance systemic chemotherapy with single agent Alimta status post 3 cycles and tolerating it well. The recent CT scan of the chest, abdomen and pelvis showed no evidence for disease progression. I discussed the scan results with the patient  today. I recommended for the patient to proceed with cycle #4 today as a scheduled. For hypertension, I strongly recommended for the patient to reconsult with his primary care physician for adjustment of his antihypertensive medication. He would come back for follow-up visit in 3 weeks for reevaluation before starting cycle #5. He was advised to call immediately if he has any concerning symptoms in the interval. The patient voices understanding of current disease status and treatment options and is in agreement with the current care plan.  All questions were answered. The patient knows to call the clinic with any problems, questions or concerns. We can certainly see the patient much sooner if necessary.  Disclaimer: This note was dictated with voice recognition software. Similar sounding words can inadvertently be transcribed and may not be corrected upon review.

## 2016-03-01 NOTE — Patient Instructions (Signed)
[facility] Discharge Instructions for Patients Receiving Chemotherapy  Today you received the following chemotherapy agents Alimta.  To help prevent nausea and vomiting after your treatment, we encourage you to take your nausea medication as prescribed.   If you develop nausea and vomiting that is not controlled by your nausea medication, call the clinic.   BELOW ARE SYMPTOMS THAT SHOULD BE REPORTED IMMEDIATELY:  *FEVER GREATER THAN 100.5 F  *CHILLS WITH OR WITHOUT FEVER  NAUSEA AND VOMITING THAT IS NOT CONTROLLED WITH YOUR NAUSEA MEDICATION  *UNUSUAL SHORTNESS OF BREATH  *UNUSUAL BRUISING OR BLEEDING  TENDERNESS IN MOUTH AND THROAT WITH OR WITHOUT PRESENCE OF ULCERS  *URINARY PROBLEMS  *BOWEL PROBLEMS  UNUSUAL RASH Items with * indicate a potential emergency and should be followed up as soon as possible.  Feel free to call the clinic you have any questions or concerns. The clinic phone number is [phone].  Please show the CHEMO ALERT CARD at check-in to the Emergency Department and triage nurse.   

## 2016-03-02 DIAGNOSIS — I10 Essential (primary) hypertension: Secondary | ICD-10-CM | POA: Diagnosis not present

## 2016-03-16 DIAGNOSIS — I10 Essential (primary) hypertension: Secondary | ICD-10-CM | POA: Diagnosis not present

## 2016-03-22 ENCOUNTER — Encounter: Payer: Self-pay | Admitting: Internal Medicine

## 2016-03-22 ENCOUNTER — Ambulatory Visit (HOSPITAL_BASED_OUTPATIENT_CLINIC_OR_DEPARTMENT_OTHER): Payer: PPO | Admitting: Internal Medicine

## 2016-03-22 ENCOUNTER — Ambulatory Visit (HOSPITAL_BASED_OUTPATIENT_CLINIC_OR_DEPARTMENT_OTHER): Payer: PPO

## 2016-03-22 ENCOUNTER — Telehealth: Payer: Self-pay | Admitting: Internal Medicine

## 2016-03-22 ENCOUNTER — Telehealth: Payer: Self-pay | Admitting: Medical Oncology

## 2016-03-22 ENCOUNTER — Other Ambulatory Visit (HOSPITAL_BASED_OUTPATIENT_CLINIC_OR_DEPARTMENT_OTHER): Payer: PPO

## 2016-03-22 VITALS — BP 170/75 | HR 60 | Temp 98.3°F | Resp 16 | Ht 68.0 in | Wt 169.6 lb

## 2016-03-22 DIAGNOSIS — K121 Other forms of stomatitis: Secondary | ICD-10-CM

## 2016-03-22 DIAGNOSIS — Z5111 Encounter for antineoplastic chemotherapy: Secondary | ICD-10-CM

## 2016-03-22 DIAGNOSIS — R5383 Other fatigue: Secondary | ICD-10-CM

## 2016-03-22 DIAGNOSIS — C342 Malignant neoplasm of middle lobe, bronchus or lung: Secondary | ICD-10-CM | POA: Diagnosis not present

## 2016-03-22 DIAGNOSIS — C3411 Malignant neoplasm of upper lobe, right bronchus or lung: Secondary | ICD-10-CM

## 2016-03-22 DIAGNOSIS — I10 Essential (primary) hypertension: Secondary | ICD-10-CM

## 2016-03-22 DIAGNOSIS — C3491 Malignant neoplasm of unspecified part of right bronchus or lung: Secondary | ICD-10-CM

## 2016-03-22 LAB — COMPREHENSIVE METABOLIC PANEL
ALBUMIN: 3.6 g/dL (ref 3.5–5.0)
ALK PHOS: 75 U/L (ref 40–150)
ALT: 25 U/L (ref 0–55)
ANION GAP: 6 meq/L (ref 3–11)
AST: 37 U/L — ABNORMAL HIGH (ref 5–34)
BUN: 9.5 mg/dL (ref 7.0–26.0)
CALCIUM: 10.4 mg/dL (ref 8.4–10.4)
CHLORIDE: 96 meq/L — AB (ref 98–109)
CO2: 26 mEq/L (ref 22–29)
Creatinine: 0.7 mg/dL (ref 0.7–1.3)
EGFR: 84 mL/min/{1.73_m2} — AB (ref >90)
Glucose: 100 mg/dl (ref 70–140)
POTASSIUM: 4.9 meq/L (ref 3.5–5.1)
Sodium: 129 mEq/L — ABNORMAL LOW (ref 136–145)
Total Bilirubin: 0.52 mg/dL (ref 0.20–1.20)
Total Protein: 6.7 g/dL (ref 6.4–8.3)

## 2016-03-22 LAB — CBC WITH DIFFERENTIAL/PLATELET
BASO%: 0.2 % (ref 0.0–2.0)
BASOS ABS: 0 10*3/uL (ref 0.0–0.1)
EOS%: 0 % (ref 0.0–7.0)
Eosinophils Absolute: 0 10*3/uL (ref 0.0–0.5)
HEMATOCRIT: 31.4 % — AB (ref 38.4–49.9)
HEMOGLOBIN: 10.4 g/dL — AB (ref 13.0–17.1)
LYMPH#: 1 10*3/uL (ref 0.9–3.3)
LYMPH%: 7.6 % — AB (ref 14.0–49.0)
MCH: 33 pg (ref 27.2–33.4)
MCHC: 33 g/dL (ref 32.0–36.0)
MCV: 100 fL — AB (ref 79.3–98.0)
MONO#: 1.7 10*3/uL — AB (ref 0.1–0.9)
MONO%: 12.7 % (ref 0.0–14.0)
NEUT#: 10.4 10*3/uL — ABNORMAL HIGH (ref 1.5–6.5)
NEUT%: 79.5 % — ABNORMAL HIGH (ref 39.0–75.0)
PLATELETS: 540 10*3/uL — AB (ref 140–400)
RBC: 3.14 10*6/uL — ABNORMAL LOW (ref 4.20–5.82)
RDW: 15 % — ABNORMAL HIGH (ref 11.0–14.6)
WBC: 13.1 10*3/uL — ABNORMAL HIGH (ref 4.0–10.3)

## 2016-03-22 MED ORDER — SODIUM CHLORIDE 0.9 % IV SOLN
500.0000 mg/m2 | Freq: Once | INTRAVENOUS | Status: AC
  Administered 2016-03-22: 950 mg via INTRAVENOUS
  Filled 2016-03-22: qty 38

## 2016-03-22 MED ORDER — SODIUM CHLORIDE 0.9% FLUSH
10.0000 mL | INTRAVENOUS | Status: DC | PRN
  Administered 2016-03-22: 10 mL
  Filled 2016-03-22: qty 10

## 2016-03-22 MED ORDER — PROCHLORPERAZINE MALEATE 10 MG PO TABS
ORAL_TABLET | ORAL | Status: AC
  Filled 2016-03-22: qty 1

## 2016-03-22 MED ORDER — PROCHLORPERAZINE MALEATE 10 MG PO TABS
10.0000 mg | ORAL_TABLET | Freq: Once | ORAL | Status: AC
  Administered 2016-03-22: 10 mg via ORAL

## 2016-03-22 MED ORDER — SODIUM CHLORIDE 0.9 % IV SOLN
Freq: Once | INTRAVENOUS | Status: AC
  Administered 2016-03-22: 14:00:00 via INTRAVENOUS

## 2016-03-22 MED ORDER — MAGIC MOUTHWASH
5.0000 mL | Freq: Three times a day (TID) | ORAL | 1 refills | Status: DC
Start: 2016-03-22 — End: 2016-04-12

## 2016-03-22 MED ORDER — HEPARIN SOD (PORK) LOCK FLUSH 100 UNIT/ML IV SOLN
500.0000 [IU] | Freq: Once | INTRAVENOUS | Status: AC | PRN
  Administered 2016-03-22: 500 [IU]
  Filled 2016-03-22: qty 5

## 2016-03-22 NOTE — Telephone Encounter (Signed)
BP elevated today , I told pt to contact PCP . I tried to contact Dr Royston Sinner office but it is " closed unttil 1 pm for a staff meeting".

## 2016-03-22 NOTE — Patient Instructions (Signed)
[facility] Discharge Instructions for Patients Receiving Chemotherapy  Today you received the following chemotherapy agents; Alimta.   To help prevent nausea and vomiting after your treatment, we encourage you to take your nausea medication as directed.    If you develop nausea and vomiting that is not controlled by your nausea medication, call the clinic.   BELOW ARE SYMPTOMS THAT SHOULD BE REPORTED IMMEDIATELY:  *FEVER GREATER THAN 100.5 F  *CHILLS WITH OR WITHOUT FEVER  NAUSEA AND VOMITING THAT IS NOT CONTROLLED WITH YOUR NAUSEA MEDICATION  *UNUSUAL SHORTNESS OF BREATH  *UNUSUAL BRUISING OR BLEEDING  TENDERNESS IN MOUTH AND THROAT WITH OR WITHOUT PRESENCE OF ULCERS  *URINARY PROBLEMS  *BOWEL PROBLEMS  UNUSUAL RASH Items with * indicate a potential emergency and should be followed up as soon as possible.  Feel free to call the clinic you have any questions or concerns. The clinic phone number is [phone].  Please show the CHEMO ALERT CARD at check-in to the Emergency Department and triage nurse.   

## 2016-03-22 NOTE — Progress Notes (Signed)
    [facility] [phone]   [phone]  OFFICE PROGRESS NOTE  JOBE,DANIEL B., MD [address] [place] [address]  DIAGNOSIS: stage IIIB (T1a, N3, M0) non-small cell lung cancer, adenocarcinoma with negative EGFR mutation, negative ALKgene translocation, negative ROS 1, but positive RET diagnosed in November 2016 and presented with right middle lobe lung nodule in addition to mediastinal and left supraclavicular lymphadenopathy.  PRIOR THERAPY: Systemic chemotherapy with carboplatin for AUC of 5 and Alimta 500 MG/M2 every 3 weeks, first dose 08/11/2015. Status post 6 cycles with partial response.  CURRENT THERAPY: Maintenance Systemic chemotherapy with single agent Alimta 500 MG/M2 every 3 weeks, first dose 12/22/2015. Status post 4 cycles.  INTERVAL HISTORY: Sean Cooke 82 y.o. male returns to the clinic today for follow-up visit. The patient is feeling fine today with no specific complaints. He is currently on maintenance systemic chemotherapy with single agent Alimta status post 4 cycles and tolerating it well except for fatigue. He denied having any significant fever or chills. He has no nausea or vomiting. He denied having any significant chest pain, shortness of breath, cough or hemoptysis. He denied having any significant weight loss or night sweats. He is here today to start cycle #5 of his treatment. His blood pressure was elevated today but it was within the normal range when he saw his primary care physician last week.  MEDICAL HISTORY: Past Medical History:  Diagnosis Date  . Adenomatous colon polyp 05/2003  . Arthritis   . Degenerative joint disease   . Diverticulosis   . Duodenal diverticulum   . Encounter for antineoplastic chemotherapy 07/31/2015  . GERD (gastroesophageal reflux disease)   . Hemorrhoids   . Hiatal hernia   . Lung cancer (HCC)   . Neuritis/radiculitis due to displacement of lumbar  intervertebral disc   . Pneumonia   . Rib fracture     ALLERGIES:  is allergic to benzonatate; lyrica [pregabalin]; penicillins; pneumococcal vaccine polyvalent; tizanidine; and tramadol.  MEDICATIONS:  Current Outpatient Prescriptions  Medication Sig Dispense Refill  . acetaminophen (TYLENOL) 650 MG CR tablet Take 650 mg by mouth every 6 (six) hours as needed for pain or fever. Reported on 09/22/2015    . alum & mag hydroxide-simeth (MAALOX PLUS) 400-400-40 MG/5ML suspension Take 5 mLs by mouth every 6 (six) hours as needed for indigestion. Reported on 11/03/2015    . antiseptic oral rinse (BIOTENE) LIQD 15 mLs by Mouth Rinse route as needed for dry mouth.    . aspirin 81 MG tablet Take 81 mg by mouth at bedtime.     . Calcium Carbonate-Vitamin D (CALCIUM + D PO) Take 1 capsule by mouth 2 (two) times daily.     . cromolyn (OPTICROM) 4 % ophthalmic solution Reported on 10/27/2015    . denosumab (PROLIA) 60 MG/ML SOLN injection Inject 60 mg into the skin every 6 (six) months. Administer in upper arm, thigh, or abdomen    . dexamethasone (DECADRON) 4 MG tablet 4 mg by mouth twice a day the day before, day of and day after the chemotherapy every 3 weeks 40 tablet 1  . dextromethorphan-guaiFENesin (ROBITUSSIN-DM) 10-100 MG/5ML liquid Take 10 mLs by mouth every 4 (four) hours as needed for cough. Reported on 03/01/2016    . ferrous sulfate 325 (65 FE) MG tablet Take 325 mg by mouth 2 (two) times daily with a meal.    . folic acid (FOLVITE) 1 MG tablet TAKE 1   TABLET (1 MG TOTAL) BY MOUTH DAILY. 30 tablet 4  . guaiFENesin (MUCINEX) 600 MG 12 hr tablet Take 600 mg by mouth 2 (two) times daily as needed.    . lactobacillus acidophilus & bulgar (LACTINEX) chewable tablet Chew 1 tablet by mouth 2 (two) times daily. Store in [place], O[facility]    . lidocaine-prilocaine (EMLA) cream Apply 1 application topically as needed. 30 g 3  . loratadine (CLARITIN) 10 MG tablet Take 10 mg by mouth.    . losartan (COZAAR) 50 MG  tablet Take 50 mg by mouth daily.    . magic mouthwash SOLN Take 5 mLs by mouth 3 (three) times daily. DUKES  3  . meloxicam (MOBIC) 7.5 MG tablet Take 7.5 mg by mouth every other day. Reported on 10/27/2015    . Multiple Vitamin (MULTIVITAMIN) tablet Take 1 tablet by mouth daily.    . Omega-3 Fatty Acids (FISH OIL) 1000 MG CAPS Take 1,000 mg by mouth daily.     . oxybutynin (DITROPAN) 5 MG tablet Take 5 mg by mouth daily.    . pantoprazole (PROTONIX) 40 MG tablet Take 40 mg by mouth daily.    . polyethylene glycol (MIRALAX / GLYCOLAX) packet Take by mouth.    . Probiotic Product (ALIGN) 4 MG CAPS Take 1 capsule by mouth daily.    . PROCTOSOL HC 2.5 % rectal cream INSERT INTO THE RECTUM TWO (2) TIMES A DAY.  0  . saw palmetto 160 MG capsule Take 160 mg by mouth daily.    . Tamsulosin HCl (FLOMAX) 0.4 MG CAPS Take 0.4 mg by mouth daily.    . loperamide (IMODIUM) 2 MG capsule Take 2 mg by mouth as needed for diarrhea or loose stools. Reported on 03/01/2016    . prochlorperazine (COMPAZINE) 10 MG tablet TAKE 1 TABLET (10 MG TOTAL) BY MOUTH EVERY 6 (SIX) HOURS AS NEEDED FOR NAUSEA OR VOMITING. (Patient not taking: Reported on 03/22/2016) 30 tablet 0   No current facility-administered medications for this visit.     SURGICAL HISTORY:  Past Surgical History:  Procedure Laterality Date  . CATARACT EXTRACTION     bilateral  . CHOLECYSTECTOMY  06/2003  . INGUINAL HERNIA REPAIR    . SPLENECTOMY  2002  . TRANSURETHRAL RESECTION OF PROSTATE      REVIEW OF SYSTEMS:  A comprehensive review of systems was negative except for: Constitutional: positive for fatigue   PHYSICAL EXAMINATION: General appearance: alert, cooperative, fatigued and no distress Head: Normocephalic, without obvious abnormality, atraumatic Neck: no adenopathy, no JVD, supple, symmetrical, trachea midline and thyroid not enlarged, symmetric, no tenderness/mass/nodules Lymph nodes: Cervical, supraclavicular, and axillary nodes  normal. Resp: clear to auscultation bilaterally Back: symmetric, no curvature. ROM normal. No CVA tenderness. Cardio: regular rate and rhythm, S1, S2 normal, no murmur, click, rub or gallop GI: soft, non-tender; bowel sounds normal; no masses,  no organomegaly Extremities: extremities normal, atraumatic, no cyanosis or edema Neurologic: Alert and oriented X 3, normal strength and tone. Normal symmetric reflexes. Normal coordination and gait  ECOG PERFORMANCE STATUS: 1 - Symptomatic but completely ambulatory  Blood pressure (!) 176/75, pulse 60, temperature 98.3 F (36.8 C), temperature source Oral, resp. rate 16, height 5' 8" (1.727 m), weight 169 lb 9.6 oz (76.9 kg), SpO2 100 %.  LABORATORY DATA: Lab Results  Component Value Date   WBC 13.1 (H) 03/22/2016   HGB 10.4 (L) 03/22/2016   HCT 31.4 (L) 03/22/2016   MCV 100.0 (H) 03/22/2016   PLT   540 (H) 03/22/2016      Chemistry      Component Value Date/Time   NA 129 (L) 03/22/2016 1115   K 4.9 03/22/2016 1115   CL 100 (L) 08/30/2015 1150   CO2 26 03/22/2016 1115   BUN 9.5 03/22/2016 1115   CREATININE 0.7 03/22/2016 1115      Component Value Date/Time   CALCIUM 10.4 03/22/2016 1115   ALKPHOS 75 03/22/2016 1115   AST 37 (H) 03/22/2016 1115   ALT 25 03/22/2016 1115   BILITOT 0.52 03/22/2016 1115       RADIOGRAPHIC STUDIES: Ct Chest W Contrast  Result Date: 02/28/2016 CLINICAL DATA:  Followup right lung cancer. EXAM: CT CHEST, ABDOMEN, AND PELVIS WITH CONTRAST TECHNIQUE: Multidetector CT imaging of the chest, abdomen and pelvis was performed following the standard protocol during bolus administration of intravenous contrast. CONTRAST:  100mL ISOVUE-300 IOPAMIDOL (ISOVUE-300) INJECTION 61% COMPARISON:  12/08/2015 FINDINGS: CT CHEST FINDINGS Mediastinum/Lymph Nodes: There is a normal heart size. Stable small pericardial effusion. Aortic atherosclerosis noted. Calcification involving be RCA, LAD and left circumflex coronary artery  is identified. No mediastinal or hilar adenopathy identified. Lungs/Pleura: No pleural fluid. Postsurgical changes including scarring within the right middle lobe are stable. No suspicious pulmonary nodule or mass identified. Musculoskeletal: No aggressive lytic or sclerotic bone lesion. CT ABDOMEN PELVIS FINDINGS Hepatobiliary: Low density structure along the dome of liver measures 10 mm and is unchanged from previous exam, image 43 of series 2. Previous cholecystectomy. No biliary dilatation. Pancreas: Normal appearance of the pancreas. Spleen: Previous splenectomy. Splenules are identified within the left upper quadrant of the abdomen. Adrenals/Urinary Tract: The adrenal glands are normal. Small right renal cysts noted measuring 1.2 cm, image 67 of series 2. Stone within the inferior pole of the left kidney measures 6 mm, image 72 of series 2. The urinary bladder appears normal Stomach/Bowel: No evidence of obstruction, inflammatory process, or abnormal fluid collections. Numerous distal colonic diverticula identified without acute inflammation. Vascular/Lymphatic: Calcified atherosclerotic disease involves the abdominal aorta. No aneurysm. No enlarged retroperitoneal or mesenteric adenopathy. No enlarged pelvic or inguinal lymph nodes. Reproductive: No mass or other significant abnormality. Other: Supraumbilical ventral abdominal wall hernia is noted which contain fat only, image 66 of series 2. Musculoskeletal: There are is degenerative changes involving both hip joints. Multi level degenerative disc disease noted within the lumbar spine. IMPRESSION: 1. Stable CT of the chest. 2. Postsurgical changes involving the right middle lobe noted. No specific findings to suggest local tumor recurrence or metastatic disease. 3. Small pericardial effusion 4. Aortic atherosclerosis and coronary artery calcifications. Electronically Signed   By: Taylor  Stroud M.D.   On: 02/28/2016 14:20   Ct Abdomen Pelvis W  Contrast  Result Date: 02/28/2016 CLINICAL DATA:  Followup right lung cancer. EXAM: CT CHEST, ABDOMEN, AND PELVIS WITH CONTRAST TECHNIQUE: Multidetector CT imaging of the chest, abdomen and pelvis was performed following the standard protocol during bolus administration of intravenous contrast. CONTRAST:  100mL ISOVUE-300 IOPAMIDOL (ISOVUE-300) INJECTION 61% COMPARISON:  12/08/2015 FINDINGS: CT CHEST FINDINGS Mediastinum/Lymph Nodes: There is a normal heart size. Stable small pericardial effusion. Aortic atherosclerosis noted. Calcification involving be RCA, LAD and left circumflex coronary artery is identified. No mediastinal or hilar adenopathy identified. Lungs/Pleura: No pleural fluid. Postsurgical changes including scarring within the right middle lobe are stable. No suspicious pulmonary nodule or mass identified. Musculoskeletal: No aggressive lytic or sclerotic bone lesion. CT ABDOMEN PELVIS FINDINGS Hepatobiliary: Low density structure along the dome of liver measures 10   mm and is unchanged from previous exam, image 43 of series 2. Previous cholecystectomy. No biliary dilatation. Pancreas: Normal appearance of the pancreas. Spleen: Previous splenectomy. Splenules are identified within the left upper quadrant of the abdomen. Adrenals/Urinary Tract: The adrenal glands are normal. Small right renal cysts noted measuring 1.2 cm, image 67 of series 2. Stone within the inferior pole of the left kidney measures 6 mm, image 72 of series 2. The urinary bladder appears normal Stomach/Bowel: No evidence of obstruction, inflammatory process, or abnormal fluid collections. Numerous distal colonic diverticula identified without acute inflammation. Vascular/Lymphatic: Calcified atherosclerotic disease involves the abdominal aorta. No aneurysm. No enlarged retroperitoneal or mesenteric adenopathy. No enlarged pelvic or inguinal lymph nodes. Reproductive: No mass or other significant abnormality. Other: Supraumbilical  ventral abdominal wall hernia is noted which contain fat only, image 66 of series 2. Musculoskeletal: There are is degenerative changes involving both hip joints. Multi level degenerative disc disease noted within the lumbar spine. IMPRESSION: 1. Stable CT of the chest. 2. Postsurgical changes involving the right middle lobe noted. No specific findings to suggest local tumor recurrence or metastatic disease. 3. Small pericardial effusion 4. Aortic atherosclerosis and coronary artery calcifications. Electronically Signed   By: Taylor  Stroud M.D.   On: 02/28/2016 14:20    ASSESSMENT AND PLAN: This is a very pleasant 82 years old white male with a stage IIIB non-small cell lung cancer with negative EGFR mutation, negative ALK gene translocation and PDL 1 expression 10%. The patient is not eligible for concurrent chemoradiation because of the large radiotherapy field. The patient completed induction systemic chemotherapy with carboplatin and Alimta status post 6 cycles. He tolerated his treatment well except for fatigue and sore mouth. He is currently undergoing maintenance systemic chemotherapy with single agent Alimta status post 4 cycles and tolerating it well. I recommended for the patient to proceed with cycle #5 today as scheduled. He'll come back for follow-up visit in 3 weeks for evaluation before starting cycle #6. For hypertension, I strongly recommended for the patient to reconsult with his primary care physician for adjustment of his antihypertensive medication. He was advised to call immediately if he has any concerning symptoms in the interval. The patient voices understanding of current disease status and treatment options and is in agreement with the current care plan.  All questions were answered. The patient knows to call the clinic with any problems, questions or concerns. We can certainly see the patient much sooner if necessary.  Disclaimer: This note was dictated with voice  recognition software. Similar sounding words can inadvertently be transcribed and may not be corrected upon review.       

## 2016-03-22 NOTE — Telephone Encounter (Signed)
per pof to sch pt appt-apps made gave pt copy of avs/cal

## 2016-03-23 ENCOUNTER — Telehealth: Payer: Self-pay | Admitting: *Deleted

## 2016-03-23 NOTE — Telephone Encounter (Signed)
TC from patient to advise [place] that he checked his BP today at the drug store and it is better than yesterday. Today it was 156/84.  Yesterday @ office visit it was 176/75.  Advised Sean Cooke to call his PCP to inform him of his BP readings this week. Pt verbalizes that he would.  No other concerns voiced today.

## 2016-03-27 DIAGNOSIS — I10 Essential (primary) hypertension: Secondary | ICD-10-CM | POA: Diagnosis not present

## 2016-04-04 DIAGNOSIS — L723 Sebaceous cyst: Secondary | ICD-10-CM | POA: Diagnosis not present

## 2016-04-04 DIAGNOSIS — L570 Actinic keratosis: Secondary | ICD-10-CM | POA: Diagnosis not present

## 2016-04-04 DIAGNOSIS — X32XXXD Exposure to sunlight, subsequent encounter: Secondary | ICD-10-CM | POA: Diagnosis not present

## 2016-04-04 DIAGNOSIS — Z1283 Encounter for screening for malignant neoplasm of skin: Secondary | ICD-10-CM | POA: Diagnosis not present

## 2016-04-12 ENCOUNTER — Ambulatory Visit (HOSPITAL_BASED_OUTPATIENT_CLINIC_OR_DEPARTMENT_OTHER): Payer: PPO

## 2016-04-12 ENCOUNTER — Ambulatory Visit (HOSPITAL_BASED_OUTPATIENT_CLINIC_OR_DEPARTMENT_OTHER): Payer: PPO | Admitting: Internal Medicine

## 2016-04-12 ENCOUNTER — Encounter: Payer: Self-pay | Admitting: Internal Medicine

## 2016-04-12 ENCOUNTER — Other Ambulatory Visit (HOSPITAL_BASED_OUTPATIENT_CLINIC_OR_DEPARTMENT_OTHER): Payer: PPO

## 2016-04-12 VITALS — BP 173/72 | HR 73 | Temp 98.4°F | Resp 18 | Ht 68.0 in | Wt 169.0 lb

## 2016-04-12 DIAGNOSIS — C3491 Malignant neoplasm of unspecified part of right bronchus or lung: Secondary | ICD-10-CM

## 2016-04-12 DIAGNOSIS — C342 Malignant neoplasm of middle lobe, bronchus or lung: Secondary | ICD-10-CM

## 2016-04-12 DIAGNOSIS — E871 Hypo-osmolality and hyponatremia: Secondary | ICD-10-CM

## 2016-04-12 DIAGNOSIS — Z5111 Encounter for antineoplastic chemotherapy: Secondary | ICD-10-CM

## 2016-04-12 DIAGNOSIS — I10 Essential (primary) hypertension: Secondary | ICD-10-CM | POA: Diagnosis not present

## 2016-04-12 DIAGNOSIS — C3411 Malignant neoplasm of upper lobe, right bronchus or lung: Secondary | ICD-10-CM

## 2016-04-12 LAB — COMPREHENSIVE METABOLIC PANEL
ALBUMIN: 3.8 g/dL (ref 3.5–5.0)
ALK PHOS: 85 U/L (ref 40–150)
ALT: 28 U/L (ref 0–55)
AST: 41 U/L — AB (ref 5–34)
Anion Gap: 10 mEq/L (ref 3–11)
BUN: 8.8 mg/dL (ref 7.0–26.0)
CALCIUM: 10.2 mg/dL (ref 8.4–10.4)
CHLORIDE: 96 meq/L — AB (ref 98–109)
CO2: 22 mEq/L (ref 22–29)
Creatinine: 0.7 mg/dL (ref 0.7–1.3)
EGFR: 84 mL/min/{1.73_m2} — AB (ref >90)
GLUCOSE: 166 mg/dL — AB (ref 70–140)
POTASSIUM: 4.1 meq/L (ref 3.5–5.1)
SODIUM: 128 meq/L — AB (ref 136–145)
Total Bilirubin: 0.79 mg/dL (ref 0.20–1.20)
Total Protein: 7.3 g/dL (ref 6.4–8.3)

## 2016-04-12 LAB — CBC WITH DIFFERENTIAL/PLATELET
BASO%: 0 % (ref 0.0–2.0)
BASOS ABS: 0 10*3/uL (ref 0.0–0.1)
EOS ABS: 0 10*3/uL (ref 0.0–0.5)
EOS%: 0 % (ref 0.0–7.0)
HCT: 31 % — ABNORMAL LOW (ref 38.4–49.9)
HEMOGLOBIN: 10.8 g/dL — AB (ref 13.0–17.1)
LYMPH%: 4.9 % — ABNORMAL LOW (ref 14.0–49.0)
MCH: 33.6 pg — AB (ref 27.2–33.4)
MCHC: 34.8 g/dL (ref 32.0–36.0)
MCV: 96.6 fL (ref 79.3–98.0)
MONO#: 0.7 10*3/uL (ref 0.1–0.9)
MONO%: 5.6 % (ref 0.0–14.0)
NEUT#: 10.8 10*3/uL — ABNORMAL HIGH (ref 1.5–6.5)
NEUT%: 89.5 % — AB (ref 39.0–75.0)
Platelets: 492 10*3/uL — ABNORMAL HIGH (ref 140–400)
RBC: 3.21 10*6/uL — ABNORMAL LOW (ref 4.20–5.82)
RDW: 15.2 % — AB (ref 11.0–14.6)
WBC: 12.1 10*3/uL — ABNORMAL HIGH (ref 4.0–10.3)
lymph#: 0.6 10*3/uL — ABNORMAL LOW (ref 0.9–3.3)

## 2016-04-12 MED ORDER — PROCHLORPERAZINE MALEATE 10 MG PO TABS
ORAL_TABLET | ORAL | Status: AC
  Filled 2016-04-12: qty 1

## 2016-04-12 MED ORDER — CYANOCOBALAMIN 1000 MCG/ML IJ SOLN
1000.0000 ug | Freq: Once | INTRAMUSCULAR | Status: AC
  Administered 2016-04-12: 1000 ug via INTRAMUSCULAR

## 2016-04-12 MED ORDER — PROCHLORPERAZINE MALEATE 10 MG PO TABS
10.0000 mg | ORAL_TABLET | Freq: Once | ORAL | Status: AC
  Administered 2016-04-12: 10 mg via ORAL

## 2016-04-12 MED ORDER — HEPARIN SOD (PORK) LOCK FLUSH 100 UNIT/ML IV SOLN
500.0000 [IU] | Freq: Once | INTRAVENOUS | Status: AC | PRN
  Administered 2016-04-12: 500 [IU]
  Filled 2016-04-12: qty 5

## 2016-04-12 MED ORDER — SODIUM CHLORIDE 0.9% FLUSH
10.0000 mL | INTRAVENOUS | Status: DC | PRN
  Administered 2016-04-12: 10 mL
  Filled 2016-04-12: qty 10

## 2016-04-12 MED ORDER — CYANOCOBALAMIN 1000 MCG/ML IJ SOLN
INTRAMUSCULAR | Status: AC
  Filled 2016-04-12: qty 1

## 2016-04-12 MED ORDER — SODIUM CHLORIDE 0.9 % IV SOLN
500.0000 mg/m2 | Freq: Once | INTRAVENOUS | Status: AC
  Administered 2016-04-12: 950 mg via INTRAVENOUS
  Filled 2016-04-12: qty 38

## 2016-04-12 MED ORDER — SODIUM CHLORIDE 0.9 % IV SOLN
Freq: Once | INTRAVENOUS | Status: AC
  Administered 2016-04-12: 14:00:00 via INTRAVENOUS

## 2016-04-12 NOTE — Progress Notes (Signed)
[facility] [phone]   [phone]  OFFICE PROGRESS NOTE  [name], MD [address] [place] [address]  DIAGNOSIS: stage IIIB (T1a, N3, M0) non-small cell lung cancer, adenocarcinoma with negative EGFR mutation, negative ALKgene translocation, negative ROS 1, but positive RET diagnosed in November 2016 and presented with right middle lobe lung nodule in addition to mediastinal and left supraclavicular lymphadenopathy.  PRIOR THERAPY: Systemic chemotherapy with carboplatin for AUC of 5 and Alimta 500 MG/M2 every 3 weeks, first dose 08/11/2015. Status post 6 cycles with partial response.  CURRENT THERAPY: Maintenance Systemic chemotherapy with single agent Alimta 500 MG/M2 every 3 weeks, first dose 12/22/2015. Status post 5 cycles.  INTERVAL HISTORY: Sean Cooke 82 y.o. male returns to the clinic today for follow-up visit. The patient is feeling fine today with no specific complaints. He is currently on maintenance systemic chemotherapy with single agent Alimta status post 5 cycles and tolerating it well except for fatigue. He has some dental issues and he was seen by his dentist and expected to have some dental extraction in the next few weeks. He denied having any significant fever or chills. He has no nausea or vomiting. He denied having any significant chest pain, shortness of breath, cough or hemoptysis. He denied having any significant weight loss or night sweats. He is here today to start cycle #6 of his treatment. His blood pressure is still elevated today but he mentioned it is  usually normal at home.  MEDICAL HISTORY: Past Medical History:  Diagnosis Date  . Adenomatous colon polyp 05/2003  . Arthritis   . Degenerative joint disease   . Diverticulosis   . Duodenal diverticulum   . Encounter for antineoplastic chemotherapy 07/31/2015  . GERD (gastroesophageal reflux disease)   . Hemorrhoids   . Hiatal hernia    . Lung cancer ([place])   . Neuritis/radiculitis due to displacement of lumbar intervertebral disc   . Pneumonia   . Rib fracture     ALLERGIES:  is allergic to benzonatate; lyrica [pregabalin]; penicillins; pneumococcal vaccine polyvalent; tizanidine; and tramadol.  MEDICATIONS:  Current Outpatient Prescriptions  Medication Sig Dispense Refill  . acetaminophen (TYLENOL) 650 MG CR tablet Take 650 mg by mouth every 6 (six) hours as needed for pain or fever. Reported on 09/22/2015    . alum & mag hydroxide-simeth (MAALOX PLUS) 400-400-40 MG/5ML suspension Take 5 mLs by mouth every 6 (six) hours as needed for indigestion. Reported on 11/03/2015    . antiseptic oral rinse (BIOTENE) LIQD 15 mLs by Mouth Rinse route as needed for dry mouth.    Marland Kitchen aspirin 81 MG tablet Take 81 mg by mouth at bedtime.     . Calcium Carbonate-Vitamin D (CALCIUM + D PO) Take 1 capsule by mouth 2 (two) times daily.     . cromolyn (OPTICROM) 4 % ophthalmic solution Reported on 10/27/2015    . denosumab (PROLIA) 60 MG/ML SOLN injection Inject 60 mg into the skin every 6 (six) months. Administer in upper arm, thigh, or abdomen    . dexamethasone (DECADRON) 4 MG tablet 4 mg by mouth twice a day the day before, day of and day after the chemotherapy every 3 weeks 40 tablet 1  . dextromethorphan-guaiFENesin (ROBITUSSIN-DM) 10-100 MG/5ML liquid Take 10 mLs by mouth every 4 (four) hours as needed for cough. Reported on 03/01/2016    . ferrous sulfate 325 (65 FE) MG tablet Take 325 mg by mouth  2 (two) times daily with a meal.    . folic acid (FOLVITE) 1 MG tablet TAKE 1 TABLET (1 MG TOTAL) BY MOUTH DAILY. 30 tablet 4  . guaiFENesin (MUCINEX) 600 MG 12 hr tablet Take 600 mg by mouth 2 (two) times daily as needed.    . lactobacillus acidophilus & bulgar (LACTINEX) chewable tablet Chew 1 tablet by mouth 2 (two) times daily. Store in [place], [place]    . lidocaine-prilocaine (EMLA) cream Apply 1 application topically as needed. 30 g 3  .  loperamide (IMODIUM) 2 MG capsule Take 2 mg by mouth as needed for diarrhea or loose stools. Reported on 03/01/2016    . loratadine (CLARITIN) 10 MG tablet Take 10 mg by mouth.    . losartan (COZAAR) 50 MG tablet Take 50 mg by mouth daily.    . magic mouthwash SOLN Take 5 mLs by mouth 3 (three) times daily. DUKES 240 mL 1  . meloxicam (MOBIC) 7.5 MG tablet Take 7.5 mg by mouth every other day. Reported on 10/27/2015    . Multiple Vitamin (MULTIVITAMIN) tablet Take 1 tablet by mouth daily.    . Omega-3 Fatty Acids (FISH OIL) 1000 MG CAPS Take 1,000 mg by mouth daily.     [place] Kitchen oxybutynin (DITROPAN) 5 MG tablet Take 5 mg by mouth daily.    . pantoprazole (PROTONIX) 40 MG tablet Take 40 mg by mouth daily.    . polyethylene glycol (MIRALAX / GLYCOLAX) packet Take by mouth.    . Probiotic Product (ALIGN) 4 MG CAPS Take 1 capsule by mouth daily.    . prochlorperazine (COMPAZINE) 10 MG tablet TAKE 1 TABLET (10 MG TOTAL) BY MOUTH EVERY 6 (SIX) HOURS AS NEEDED FOR NAUSEA OR VOMITING. (Patient not taking: Reported on 03/22/2016) 30 tablet 0  . PROCTOSOL HC 2.5 % rectal cream INSERT INTO THE RECTUM TWO (2) TIMES A DAY.  0  . saw palmetto 160 MG capsule Take 160 mg by mouth daily.    . Tamsulosin HCl (FLOMAX) 0.4 MG CAPS Take 0.4 mg by mouth daily.     No current facility-administered medications for this visit.     SURGICAL HISTORY:  Past Surgical History:  Procedure Laterality Date  . CATARACT EXTRACTION     bilateral  . CHOLECYSTECTOMY  06/2003  . INGUINAL HERNIA REPAIR    . SPLENECTOMY  2002  . TRANSURETHRAL RESECTION OF PROSTATE      REVIEW OF SYSTEMS:  A comprehensive review of systems was negative except for: Constitutional: positive for fatigue Ears, nose, mouth, throat, and face: positive for sore mouth   PHYSICAL EXAMINATION: General appearance: alert, cooperative, fatigued and no distress Head: Normocephalic, without obvious abnormality, atraumatic Neck: no adenopathy, no JVD, supple,  symmetrical, trachea midline and thyroid not enlarged, symmetric, no tenderness/mass/nodules Lymph nodes: Cervical, supraclavicular, and axillary nodes normal. Resp: clear to auscultation bilaterally Back: symmetric, no curvature. ROM normal. No CVA tenderness. Cardio: regular rate and rhythm, S1, S2 normal, no murmur, click, rub or gallop GI: soft, non-tender; bowel sounds normal; no masses,  no organomegaly Extremities: extremities normal, atraumatic, no cyanosis or edema Neurologic: Alert and oriented X 3, normal strength and tone. Normal symmetric reflexes. Normal coordination and gait  ECOG PERFORMANCE STATUS: 1 - Symptomatic but completely ambulatory  Blood pressure (!) 173/72, pulse 73, temperature 98.4 F (36.9 C), temperature source Oral, resp. rate 18, height _0  (1.727 m), weight 169 lb (76.7 kg), SpO2 98 %.  LABORATORY DATA: Lab Results  Component Value  Date   WBC 12.1 (H) 04/12/2016   HGB 10.8 (L) 04/12/2016   HCT 31.0 (L) 04/12/2016   MCV 96.6 04/12/2016   PLT 492 (H) 04/12/2016      Chemistry      Component Value Date/Time   NA 128 (L) 04/12/2016 1254   K 4.1 04/12/2016 1254   CL 100 (L) 08/30/2015 1150   CO2 22 04/12/2016 1254   BUN 8.8 04/12/2016 1254   CREATININE 0.7 04/12/2016 1254      Component Value Date/Time   CALCIUM 10.2 04/12/2016 1254   ALKPHOS 85 04/12/2016 1254   AST 41 (H) 04/12/2016 1254   ALT 28 04/12/2016 1254   BILITOT 0.79 04/12/2016 1254       RADIOGRAPHIC STUDIES: No results found.  ASSESSMENT AND PLAN: This is a very pleasant 82 years old white male with a stage IIIB non-small cell lung cancer with negative EGFR mutation, negative ALK gene translocation and PDL 1 expression 10%. The patient is not eligible for concurrent chemoradiation because of the large radiotherapy field. The patient completed induction systemic chemotherapy with carboplatin and Alimta status post 6 cycles. He tolerated his treatment well except for fatigue  and sore mouth. He is currently undergoing maintenance systemic chemotherapy with single agent Alimta status post 5 cycles and tolerating it well. I recommended for the patient to proceed with cycle #6 today as scheduled.  He'll come back for follow-up visit in 3 weeks for evaluation before starting cycle #7After repeating CT scan of the chest for restaging of his disease. For hypertension, I strongly recommended for the patient to reconsult with his primary care physician for adjustment of his antihypertensive medication. He was advised to call immediately if he has any concerning symptoms in the interval. The patient voices understanding of current disease status and treatment options and is in agreement with the current care plan.  All questions were answered. The patient knows to call the clinic with any problems, questions or concerns. We can certainly see the patient much sooner if necessary.  Disclaimer: This note was dictated with voice recognition software. Similar sounding words can inadvertently be transcribed and may not be corrected upon review.

## 2016-04-12 NOTE — Patient Instructions (Signed)
[facility] Discharge Instructions for Patients Receiving Chemotherapy  Today you received the following chemotherapy agents; Alimta.   To help prevent nausea and vomiting after your treatment, we encourage you to take your nausea medication as directed.    If you develop nausea and vomiting that is not controlled by your nausea medication, call the clinic.   BELOW ARE SYMPTOMS THAT SHOULD BE REPORTED IMMEDIATELY:  *FEVER GREATER THAN 100.5 F  *CHILLS WITH OR WITHOUT FEVER  NAUSEA AND VOMITING THAT IS NOT CONTROLLED WITH YOUR NAUSEA MEDICATION  *UNUSUAL SHORTNESS OF BREATH  *UNUSUAL BRUISING OR BLEEDING  TENDERNESS IN MOUTH AND THROAT WITH OR WITHOUT PRESENCE OF ULCERS  *URINARY PROBLEMS  *BOWEL PROBLEMS  UNUSUAL RASH Items with * indicate a potential emergency and should be followed up as soon as possible.  Feel free to call the clinic you have any questions or concerns. The clinic phone number is [phone].  Please show the CHEMO ALERT CARD at check-in to the Emergency Department and triage nurse.   

## 2016-04-28 ENCOUNTER — Telehealth: Payer: Self-pay | Admitting: Internal Medicine

## 2016-04-28 NOTE — Telephone Encounter (Signed)
Patient had no appt today, but came by to say that he had called and was unable to reach anyone. He also stated that he had left a v/m and that his call was not returned. He also wanted to rschd his appts, but decided to keep the schedule as is.  I confirmed his appts with him and also gave him an appt schedule for September and October. 04/28/16

## 2016-05-03 ENCOUNTER — Ambulatory Visit (HOSPITAL_BASED_OUTPATIENT_CLINIC_OR_DEPARTMENT_OTHER): Payer: PPO

## 2016-05-03 ENCOUNTER — Other Ambulatory Visit (HOSPITAL_BASED_OUTPATIENT_CLINIC_OR_DEPARTMENT_OTHER): Payer: PPO

## 2016-05-03 ENCOUNTER — Other Ambulatory Visit: Payer: Self-pay | Admitting: Internal Medicine

## 2016-05-03 ENCOUNTER — Telehealth: Payer: Self-pay | Admitting: Internal Medicine

## 2016-05-03 ENCOUNTER — Encounter: Payer: Self-pay | Admitting: Internal Medicine

## 2016-05-03 ENCOUNTER — Other Ambulatory Visit: Payer: Self-pay | Admitting: Medical Oncology

## 2016-05-03 ENCOUNTER — Ambulatory Visit (HOSPITAL_COMMUNITY)
Admission: RE | Admit: 2016-05-03 | Discharge: 2016-05-03 | Disposition: A | Discharge status: Home / Self Care | Payer: PPO | Source: Ambulatory Visit | Attending: Internal Medicine | Admitting: Internal Medicine

## 2016-05-03 ENCOUNTER — Encounter (HOSPITAL_COMMUNITY): Payer: Self-pay

## 2016-05-03 ENCOUNTER — Ambulatory Visit (HOSPITAL_BASED_OUTPATIENT_CLINIC_OR_DEPARTMENT_OTHER): Payer: PPO | Admitting: Internal Medicine

## 2016-05-03 VITALS — BP 177/82 | HR 75 | Temp 98.2°F | Resp 17 | Ht 68.0 in | Wt 166.3 lb

## 2016-05-03 DIAGNOSIS — I313 Pericardial effusion (noninflammatory): Secondary | ICD-10-CM | POA: Insufficient documentation

## 2016-05-03 DIAGNOSIS — C342 Malignant neoplasm of middle lobe, bronchus or lung: Secondary | ICD-10-CM

## 2016-05-03 DIAGNOSIS — C3411 Malignant neoplasm of upper lobe, right bronchus or lung: Secondary | ICD-10-CM | POA: Insufficient documentation

## 2016-05-03 DIAGNOSIS — I10 Essential (primary) hypertension: Secondary | ICD-10-CM | POA: Diagnosis not present

## 2016-05-03 DIAGNOSIS — I2510 Atherosclerotic heart disease of native coronary artery without angina pectoris: Secondary | ICD-10-CM | POA: Insufficient documentation

## 2016-05-03 DIAGNOSIS — C3491 Malignant neoplasm of unspecified part of right bronchus or lung: Secondary | ICD-10-CM

## 2016-05-03 DIAGNOSIS — Z5111 Encounter for antineoplastic chemotherapy: Secondary | ICD-10-CM | POA: Diagnosis not present

## 2016-05-03 DIAGNOSIS — E871 Hypo-osmolality and hyponatremia: Secondary | ICD-10-CM | POA: Diagnosis not present

## 2016-05-03 DIAGNOSIS — I700 Atherosclerosis of aorta: Secondary | ICD-10-CM | POA: Insufficient documentation

## 2016-05-03 LAB — CBC WITH DIFFERENTIAL/PLATELET
BASO%: 0 % (ref 0.0–2.0)
Basophils Absolute: 0 10*3/uL (ref 0.0–0.1)
EOS ABS: 0 10*3/uL (ref 0.0–0.5)
EOS%: 0 % (ref 0.0–7.0)
HCT: 31.9 % — ABNORMAL LOW (ref 38.4–49.9)
HGB: 11.3 g/dL — ABNORMAL LOW (ref 13.0–17.1)
LYMPH%: 6.3 % — AB (ref 14.0–49.0)
MCH: 33.8 pg — ABNORMAL HIGH (ref 27.2–33.4)
MCHC: 35.4 g/dL (ref 32.0–36.0)
MCV: 95.5 fL (ref 79.3–98.0)
MONO#: 0.6 10*3/uL (ref 0.1–0.9)
MONO%: 5.4 % (ref 0.0–14.0)
NEUT%: 88.3 % — ABNORMAL HIGH (ref 39.0–75.0)
NEUTROS ABS: 10.5 10*3/uL — AB (ref 1.5–6.5)
PLATELETS: 492 10*3/uL — AB (ref 140–400)
RBC: 3.34 10*6/uL — AB (ref 4.20–5.82)
RDW: 14.9 % — ABNORMAL HIGH (ref 11.0–14.6)
WBC: 11.9 10*3/uL — AB (ref 4.0–10.3)
lymph#: 0.8 10*3/uL — ABNORMAL LOW (ref 0.9–3.3)

## 2016-05-03 LAB — COMPREHENSIVE METABOLIC PANEL
ALBUMIN: 3.7 g/dL (ref 3.5–5.0)
ALK PHOS: 83 U/L (ref 40–150)
ALT: 34 U/L (ref 0–55)
ANION GAP: 10 meq/L (ref 3–11)
AST: 43 U/L — ABNORMAL HIGH (ref 5–34)
BILIRUBIN TOTAL: 0.77 mg/dL (ref 0.20–1.20)
BUN: 11.7 mg/dL (ref 7.0–26.0)
CO2: 21 meq/L — AB (ref 22–29)
Calcium: 10.2 mg/dL (ref 8.4–10.4)
Chloride: 98 mEq/L (ref 98–109)
Creatinine: 0.7 mg/dL (ref 0.7–1.3)
EGFR: 85 mL/min/{1.73_m2} — AB (ref >90)
Glucose: 124 mg/dl (ref 70–140)
Potassium: 4.3 mEq/L (ref 3.5–5.1)
Sodium: 129 mEq/L — ABNORMAL LOW (ref 136–145)
TOTAL PROTEIN: 7.1 g/dL (ref 6.4–8.3)

## 2016-05-03 MED ORDER — SODIUM CHLORIDE 0.9 % IV SOLN
Freq: Once | INTRAVENOUS | Status: AC
  Administered 2016-05-03: 16:00:00 via INTRAVENOUS

## 2016-05-03 MED ORDER — SODIUM CHLORIDE 0.9% FLUSH
10.0000 mL | INTRAVENOUS | Status: DC | PRN
  Administered 2016-05-03: 10 mL
  Filled 2016-05-03: qty 10

## 2016-05-03 MED ORDER — MAGIC MOUTHWASH: 5.0000 mL | Freq: Three times a day (TID) | ORAL | 1 refills | Status: DC | PRN

## 2016-05-03 MED ORDER — PROCHLORPERAZINE MALEATE 10 MG PO TABS
ORAL_TABLET | ORAL | Status: AC
  Filled 2016-05-03: qty 1

## 2016-05-03 MED ORDER — HEPARIN SOD (PORK) LOCK FLUSH 100 UNIT/ML IV SOLN
500.0000 [IU] | Freq: Once | INTRAVENOUS | Status: AC | PRN
  Administered 2016-05-03: 500 [IU]
  Filled 2016-05-03: qty 5

## 2016-05-03 MED ORDER — PEMETREXED DISODIUM CHEMO INJECTION 500 MG
500.0000 mg/m2 | Freq: Once | INTRAVENOUS | Status: AC
  Administered 2016-05-03: 950 mg via INTRAVENOUS
  Filled 2016-05-03: qty 38

## 2016-05-03 MED ORDER — PROCHLORPERAZINE MALEATE 10 MG PO TABS
10.0000 mg | ORAL_TABLET | Freq: Once | ORAL | Status: AC
  Administered 2016-05-03: 10 mg via ORAL

## 2016-05-03 MED ORDER — IOPAMIDOL (ISOVUE-300) INJECTION 61%
75.0000 mL | Freq: Once | INTRAVENOUS | Status: AC | PRN
Start: 2016-05-03 — End: 2016-05-03
  Administered 2016-05-03: 75 mL via INTRAVENOUS

## 2016-05-03 NOTE — Telephone Encounter (Signed)
Gave patient avs report and appointments for October and November.  °

## 2016-05-03 NOTE — Patient Instructions (Signed)
[facility] Discharge Instructions for Patients Receiving Chemotherapy  Today you received the following chemotherapy agents: Alimpta.  To help prevent nausea and vomiting after your treatment, we encourage you to take your nausea medication as prescribed.  If you develop nausea and vomiting that is not controlled by your nausea medication, call the clinic.   BELOW ARE SYMPTOMS THAT SHOULD BE REPORTED IMMEDIATELY:  *FEVER GREATER THAN 100.5 F  *CHILLS WITH OR WITHOUT FEVER  NAUSEA AND VOMITING THAT IS NOT CONTROLLED WITH YOUR NAUSEA MEDICATION  *UNUSUAL SHORTNESS OF BREATH  *UNUSUAL BRUISING OR BLEEDING  TENDERNESS IN MOUTH AND THROAT WITH OR WITHOUT PRESENCE OF ULCERS  *URINARY PROBLEMS  *BOWEL PROBLEMS  UNUSUAL RASH Items with * indicate a potential emergency and should be followed up as soon as possible.  Feel free to call the clinic you have any questions or concerns. The clinic phone number is [phone].  Please show the [facility] at check-in to the Emergency Department and triage nurse.

## 2016-05-03 NOTE — Progress Notes (Signed)
[facility] [phone]   [phone]  OFFICE PROGRESS NOTE  [name], MD [address] [place] [address]  DIAGNOSIS: stage IIIB (T1a, N3, M0) non-small cell lung cancer, adenocarcinoma with negative EGFR mutation, negative ALKgene translocation, negative ROS 1, but positive RET diagnosed in November 2016 and presented with right middle lobe lung nodule in addition to mediastinal and left supraclavicular lymphadenopathy.  PRIOR THERAPY: Systemic chemotherapy with carboplatin for AUC of 5 and Alimta 500 MG/M2 every 3 weeks, first dose 08/11/2015. Status post 6 cycles with partial response.  CURRENT THERAPY: Maintenance Systemic chemotherapy with single agent Alimta 500 MG/M2 every 3 weeks, first dose 12/22/2015. Status post 6 cycles.  INTERVAL HISTORY: Sean Cooke 82 y.o. male returns to the clinic today for follow-up visit. The patient is feeling fine today with no specific complaints. He is currently on maintenance systemic chemotherapy with single agent Alimta status post 6 cycles and tolerating it well except for fatigue. He is requesting a different magic mouthwash as his insurance declined the [facility] mouthwash. He denied having any significant fever or chills. He has no nausea or vomiting. He denied having any significant chest pain, shortness of breath, cough or hemoptysis. He denied having any significant weight loss or night sweats. He had repeat CT scan of the chest performed recently and he is here for evaluation and discussion of his scan results.  MEDICAL HISTORY: Past Medical History:  Diagnosis Date  . Adenomatous colon polyp 05/2003  . Arthritis   . Degenerative joint disease   . Diverticulosis   . Duodenal diverticulum   . Encounter for antineoplastic chemotherapy 07/31/2015  . GERD (gastroesophageal reflux disease)   . Hemorrhoids   . Hiatal hernia   . Lung cancer ([place])   . Neuritis/radiculitis due to  displacement of lumbar intervertebral disc   . Pneumonia   . Rib fracture     ALLERGIES:  is allergic to benzonatate; lyrica [pregabalin]; penicillins; pneumococcal vaccine polyvalent; tizanidine; and tramadol.  MEDICATIONS:  Current Outpatient Prescriptions  Medication Sig Dispense Refill  . acetaminophen (TYLENOL) 650 MG CR tablet Take 650 mg by mouth every 6 (six) hours as needed for pain or fever. Reported on 09/22/2015    . alum & mag hydroxide-simeth (MAALOX PLUS) 400-400-40 MG/5ML suspension Take 5 mLs by mouth every 6 (six) hours as needed for indigestion. Reported on 11/03/2015    . antiseptic oral rinse (BIOTENE) LIQD 15 mLs by Mouth Rinse route as needed for dry mouth.    Marland Kitchen aspirin 81 MG tablet Take 81 mg by mouth at bedtime.     . Calcium Carbonate-Vitamin D (CALCIUM + D PO) Take 1 capsule by mouth 2 (two) times daily.     . cromolyn (OPTICROM) 4 % ophthalmic solution Reported on 10-11-2015    . denosumab (PROLIA) 60 MG/ML SOLN injection Inject 60 mg into the skin every 6 (six) months. Administer in upper arm, thigh, or abdomen    . dexamethasone (DECADRON) 4 MG tablet 4 mg by mouth twice a day the day before, day of and day after the chemotherapy every 3 weeks 40 tablet 1  . dextromethorphan-guaiFENesin (ROBITUSSIN-DM) 10-100 MG/5ML liquid Take 10 mLs by mouth every 4 (four) hours as needed for cough. Reported on 03/01/2016    . ferrous sulfate 325 (65 FE) MG tablet Take 325 mg by mouth 2 (two) times daily with a meal.    . folic acid (FOLVITE) 1 MG  tablet TAKE 1 TABLET (1 MG TOTAL) BY MOUTH DAILY. 30 tablet 4  . guaiFENesin (MUCINEX) 600 MG 12 hr tablet Take 600 mg by mouth 2 (two) times daily as needed.    . lactobacillus acidophilus & bulgar (LACTINEX) chewable tablet Chew 1 tablet by mouth 2 (two) times daily. Store in [place], [place]    . lidocaine-prilocaine (EMLA) cream Apply 1 application topically as needed. 30 g 3  . loperamide (IMODIUM) 2 MG capsule Take 2 mg by mouth as needed  for diarrhea or loose stools. Reported on 03/01/2016    . loratadine (CLARITIN) 10 MG tablet Take 10 mg by mouth.    . losartan (COZAAR) 100 MG tablet Take 100 mg by mouth daily.    . meloxicam (MOBIC) 7.5 MG tablet Take 7.5 mg by mouth every other day. Reported on 12-29-2015    . Multiple Vitamin (MULTIVITAMIN) tablet Take 1 tablet by mouth daily.    . Omega-3 Fatty Acids (FISH OIL) 1000 MG CAPS Take 1,000 mg by mouth daily.     Marland Kitchen oxybutynin (DITROPAN) 5 MG tablet Take 5 mg by mouth daily.    . pantoprazole (PROTONIX) 40 MG tablet Take 40 mg by mouth daily.    . polyethylene glycol (MIRALAX / GLYCOLAX) packet Take by mouth.    . Probiotic Product (ALIGN) 4 MG CAPS Take 1 capsule by mouth daily.    . prochlorperazine (COMPAZINE) 10 MG tablet TAKE 1 TABLET (10 MG TOTAL) BY MOUTH EVERY 6 (SIX) HOURS AS NEEDED FOR NAUSEA OR VOMITING. (Patient not taking: Reported on 03/22/2016) 30 tablet 0  . PROCTOSOL HC 2.5 % rectal cream INSERT INTO THE RECTUM TWO (2) TIMES A DAY.  0  . saw palmetto 160 MG capsule Take 160 mg by mouth daily.    . Tamsulosin HCl (FLOMAX) 0.4 MG CAPS Take 0.4 mg by mouth daily.     No current facility-administered medications for this visit.     SURGICAL HISTORY:  Past Surgical History:  Procedure Laterality Date  . CATARACT EXTRACTION     bilateral  . CHOLECYSTECTOMY  06/2003  . INGUINAL HERNIA REPAIR    . SPLENECTOMY  2002  . TRANSURETHRAL RESECTION OF PROSTATE      REVIEW OF SYSTEMS:  Constitutional: positive for fatigue Eyes: negative Ears, nose, mouth, throat, and face: positive for sore mouth Respiratory: negative Cardiovascular: negative Gastrointestinal: negative Genitourinary:negative Integument/breast: negative Hematologic/lymphatic: negative Musculoskeletal:negative Neurological: negative Behavioral/Psych: negative Endocrine: negative Allergic/Immunologic: negative   PHYSICAL EXAMINATION: General appearance: alert, cooperative, fatigued and no  distress Head: Normocephalic, without obvious abnormality, atraumatic Neck: no adenopathy, no JVD, supple, symmetrical, trachea midline and thyroid not enlarged, symmetric, no tenderness/mass/nodules Lymph nodes: Cervical, supraclavicular, and axillary nodes normal. Resp: clear to auscultation bilaterally Back: symmetric, no curvature. ROM normal. No CVA tenderness. Cardio: regular rate and rhythm, S1, S2 normal, no murmur, click, rub or gallop GI: soft, non-tender; bowel sounds normal; no masses,  no organomegaly Extremities: extremities normal, atraumatic, no cyanosis or edema Neurologic: Alert and oriented X 3, normal strength and tone. Normal symmetric reflexes. Normal coordination and gait  ECOG PERFORMANCE STATUS: 1 - Symptomatic but completely ambulatory  Blood pressure (!) 177/82, pulse 75, temperature 98.2 F (36.8 C), temperature source Oral, resp. rate 17, height 5' 8" (1.727 m), weight 166 lb 4.8 oz (75.4 kg), SpO2 99 %.  LABORATORY DATA: Lab Results  Component Value Date   WBC 11.9 (H) 05/03/2016   HGB 11.3 (L) 05/03/2016   HCT 31.9 (L) 05/03/2016  MCV 95.5 05/03/2016   PLT 492 (H) 05/03/2016      Chemistry      Component Value Date/Time   NA 128 (L) 04/12/2016 1254   K 4.1 04/12/2016 1254   CL 100 (L) 08/30/2015 1150   CO2 22 04/12/2016 1254   BUN 8.8 04/12/2016 1254   CREATININE 0.7 04/12/2016 1254      Component Value Date/Time   CALCIUM 10.2 04/12/2016 1254   ALKPHOS 85 04/12/2016 1254   AST 41 (H) 04/12/2016 1254   ALT 28 04/12/2016 1254   BILITOT 0.79 04/12/2016 1254       RADIOGRAPHIC STUDIES: No results found.  ASSESSMENT AND PLAN: This is a very pleasant 82 years old white male with a stage IIIB non-small cell lung cancer with negative EGFR mutation, negative ALK gene translocation and PDL 1 expression 10%. The patient is not eligible for concurrent chemoradiation because of the large radiotherapy field. The patient completed induction systemic  chemotherapy with carboplatin and Alimta status post 6 cycles. He tolerated his treatment well except for fatigue and sore mouth. He is currently undergoing maintenance systemic chemotherapy with single agent Alimta status post 6 cycles and tolerating it well. He had repeat CT scan of the chest performed earlier today but the final report is still pending I personally reviewed the scan myself and I did not see any concerning findings for disease progression. I will call the patient if there is any concerning findings on the final report. I recommended for the patient to proceed with cycle #7 today as scheduled.  He'll come back for follow-up visit in 3 weeks for evaluation before starting cycle #8. For hypertension, he mentioned that his blood pressure is usually better at home. I strongly recommended for the patient to reconsult with his primary care physician for adjustment of his antihypertensive medication. He was advised to call immediately if he has any concerning symptoms in the interval. The patient voices understanding of current disease status and treatment options and is in agreement with the current care plan.  All questions were answered. The patient knows to call the clinic with any problems, questions or concerns. We can certainly see the patient much sooner if necessary.  Disclaimer: This note was dictated with voice recognition software. Similar sounding words can inadvertently be transcribed and may not be corrected upon review.

## 2016-05-10 DIAGNOSIS — H11823 Conjunctivochalasis, bilateral: Secondary | ICD-10-CM | POA: Diagnosis not present

## 2016-05-10 DIAGNOSIS — Z961 Presence of intraocular lens: Secondary | ICD-10-CM | POA: Diagnosis not present

## 2016-05-10 DIAGNOSIS — Z23 Encounter for immunization: Secondary | ICD-10-CM | POA: Diagnosis not present

## 2016-05-11 DIAGNOSIS — J029 Acute pharyngitis, unspecified: Secondary | ICD-10-CM | POA: Diagnosis not present

## 2016-05-11 DIAGNOSIS — Q8901 Asplenia (congenital): Secondary | ICD-10-CM | POA: Diagnosis not present

## 2016-05-17 ENCOUNTER — Other Ambulatory Visit: Payer: Self-pay | Admitting: Internal Medicine

## 2016-05-22 ENCOUNTER — Other Ambulatory Visit: Payer: Self-pay | Admitting: Internal Medicine

## 2016-05-24 ENCOUNTER — Ambulatory Visit (HOSPITAL_BASED_OUTPATIENT_CLINIC_OR_DEPARTMENT_OTHER): Payer: PPO

## 2016-05-24 ENCOUNTER — Other Ambulatory Visit (HOSPITAL_BASED_OUTPATIENT_CLINIC_OR_DEPARTMENT_OTHER): Payer: PPO

## 2016-05-24 ENCOUNTER — Telehealth: Payer: Self-pay | Admitting: Internal Medicine

## 2016-05-24 ENCOUNTER — Ambulatory Visit (HOSPITAL_BASED_OUTPATIENT_CLINIC_OR_DEPARTMENT_OTHER): Payer: PPO | Admitting: Internal Medicine

## 2016-05-24 ENCOUNTER — Encounter: Payer: Self-pay | Admitting: Internal Medicine

## 2016-05-24 VITALS — BP 165/67 | HR 66 | Temp 98.1°F | Resp 98 | Ht 68.0 in | Wt 165.6 lb

## 2016-05-24 DIAGNOSIS — I10 Essential (primary) hypertension: Secondary | ICD-10-CM | POA: Diagnosis not present

## 2016-05-24 DIAGNOSIS — C3411 Malignant neoplasm of upper lobe, right bronchus or lung: Secondary | ICD-10-CM

## 2016-05-24 DIAGNOSIS — Z5111 Encounter for antineoplastic chemotherapy: Secondary | ICD-10-CM | POA: Diagnosis not present

## 2016-05-24 DIAGNOSIS — C342 Malignant neoplasm of middle lobe, bronchus or lung: Secondary | ICD-10-CM | POA: Diagnosis not present

## 2016-05-24 DIAGNOSIS — C3491 Malignant neoplasm of unspecified part of right bronchus or lung: Secondary | ICD-10-CM

## 2016-05-24 LAB — CBC WITH DIFFERENTIAL/PLATELET
BASO%: 0 % (ref 0.0–2.0)
Basophils Absolute: 0 10*3/uL (ref 0.0–0.1)
EOS%: 0.1 % (ref 0.0–7.0)
Eosinophils Absolute: 0 10*3/uL (ref 0.0–0.5)
HCT: 29.8 % — ABNORMAL LOW (ref 38.4–49.9)
HEMOGLOBIN: 10.4 g/dL — AB (ref 13.0–17.1)
LYMPH%: 8.1 % — AB (ref 14.0–49.0)
MCH: 33.9 pg — ABNORMAL HIGH (ref 27.2–33.4)
MCHC: 34.9 g/dL (ref 32.0–36.0)
MCV: 97.1 fL (ref 79.3–98.0)
MONO#: 1.3 10*3/uL — ABNORMAL HIGH (ref 0.1–0.9)
MONO%: 11.9 % (ref 0.0–14.0)
NEUT%: 79.9 % — ABNORMAL HIGH (ref 39.0–75.0)
NEUTROS ABS: 8.8 10*3/uL — AB (ref 1.5–6.5)
Platelets: 467 10*3/uL — ABNORMAL HIGH (ref 140–400)
RBC: 3.07 10*6/uL — AB (ref 4.20–5.82)
RDW: 14 % (ref 11.0–14.6)
WBC: 11.1 10*3/uL — AB (ref 4.0–10.3)
lymph#: 0.9 10*3/uL (ref 0.9–3.3)

## 2016-05-24 LAB — COMPREHENSIVE METABOLIC PANEL
ALT: 23 U/L (ref 0–55)
AST: 35 U/L — AB (ref 5–34)
Albumin: 3.4 g/dL — ABNORMAL LOW (ref 3.5–5.0)
Alkaline Phosphatase: 72 U/L (ref 40–150)
Anion Gap: 7 mEq/L (ref 3–11)
BILIRUBIN TOTAL: 0.56 mg/dL (ref 0.20–1.20)
BUN: 11.7 mg/dL (ref 7.0–26.0)
CO2: 25 meq/L (ref 22–29)
CREATININE: 0.7 mg/dL (ref 0.7–1.3)
Calcium: 10 mg/dL (ref 8.4–10.4)
Chloride: 95 mEq/L — ABNORMAL LOW (ref 98–109)
EGFR: 86 mL/min/{1.73_m2} — AB (ref >90)
GLUCOSE: 111 mg/dL (ref 70–140)
Potassium: 4.3 mEq/L (ref 3.5–5.1)
SODIUM: 127 meq/L — AB (ref 136–145)
TOTAL PROTEIN: 6.5 g/dL (ref 6.4–8.3)

## 2016-05-24 MED ORDER — PROCHLORPERAZINE MALEATE 10 MG PO TABS
10.0000 mg | ORAL_TABLET | Freq: Once | ORAL | Status: AC
  Administered 2016-05-24: 10 mg via ORAL

## 2016-05-24 MED ORDER — HEPARIN SOD (PORK) LOCK FLUSH 100 UNIT/ML IV SOLN
500.0000 [IU] | Freq: Once | INTRAVENOUS | Status: AC | PRN
  Administered 2016-05-24: 500 [IU]
  Filled 2016-05-24: qty 5

## 2016-05-24 MED ORDER — SODIUM CHLORIDE 0.9 % IV SOLN
500.0000 mg/m2 | Freq: Once | INTRAVENOUS | Status: AC
  Administered 2016-05-24: 950 mg via INTRAVENOUS
  Filled 2016-05-24: qty 38

## 2016-05-24 MED ORDER — SODIUM CHLORIDE 0.9% FLUSH
10.0000 mL | INTRAVENOUS | Status: DC | PRN
  Administered 2016-05-24: 10 mL
  Filled 2016-05-24: qty 10

## 2016-05-24 MED ORDER — SODIUM CHLORIDE 0.9 % IV SOLN
Freq: Once | INTRAVENOUS | Status: AC
  Administered 2016-05-24: 14:00:00 via INTRAVENOUS

## 2016-05-24 MED ORDER — PROCHLORPERAZINE MALEATE 10 MG PO TABS
ORAL_TABLET | ORAL | Status: AC
  Filled 2016-05-24: qty 1

## 2016-05-24 NOTE — Patient Instructions (Signed)
[facility] Discharge Instructions for Patients Receiving Chemotherapy  Today you received the following chemotherapy agents Alimta.  To help prevent nausea and vomiting after your treatment, we encourage you to take your nausea medication as prescribed.   If you develop nausea and vomiting that is not controlled by your nausea medication, call the clinic.   BELOW ARE SYMPTOMS THAT SHOULD BE REPORTED IMMEDIATELY:  *FEVER GREATER THAN 100.5 F  *CHILLS WITH OR WITHOUT FEVER  NAUSEA AND VOMITING THAT IS NOT CONTROLLED WITH YOUR NAUSEA MEDICATION  *UNUSUAL SHORTNESS OF BREATH  *UNUSUAL BRUISING OR BLEEDING  TENDERNESS IN MOUTH AND THROAT WITH OR WITHOUT PRESENCE OF ULCERS  *URINARY PROBLEMS  *BOWEL PROBLEMS  UNUSUAL RASH Items with * indicate a potential emergency and should be followed up as soon as possible.  Feel free to call the clinic you have any questions or concerns. The clinic phone number is [phone].  Please show the CHEMO ALERT CARD at check-in to the Emergency Department and triage nurse.   

## 2016-05-24 NOTE — Progress Notes (Signed)
[facility] [phone]   [phone]  OFFICE PROGRESS NOTE  [name], MD [address] [place] [address]  DIAGNOSIS: stage IIIB (T1a, N3, M0) non-small cell lung cancer, adenocarcinoma with negative EGFR mutation, negative ALKgene translocation, negative ROS 1, but positive RET diagnosed in November 2016 and presented with right middle lobe lung nodule in addition to mediastinal and left supraclavicular lymphadenopathy.  PRIOR THERAPY: Systemic chemotherapy with carboplatin for AUC of 5 and Alimta 500 MG/M2 every 3 weeks, first dose 08/11/2015. Status post 6 cycles with partial response.  CURRENT THERAPY: Maintenance Systemic chemotherapy with single agent Alimta 500 MG/M2 every 3 weeks, first dose 12/22/2015. Status post 7 cycles.  INTERVAL HISTORY: Sean Cooke 82 y.o. male returns to the clinic today for follow-up visit. The patient is feeling fine today with no specific complaints. He is currently on maintenance systemic chemotherapy with single agent Alimta status post 7 cycles and tolerating it well except for fatigue and mouth sores. He is currently on Magic mouthwash. He was also treated for grams disease by his dentist. He denied having any significant fever or chills. He has no nausea or vomiting. He denied having any significant chest pain, shortness of breath, cough or hemoptysis. He denied having any significant weight loss or night sweats. He is here today for evaluation before starting cycle #8.  MEDICAL HISTORY: Past Medical History:  Diagnosis Date  . Adenomatous colon polyp 05/2003  . Arthritis   . Degenerative joint disease   . Diverticulosis   . Duodenal diverticulum   . Encounter for antineoplastic chemotherapy 07/31/2015  . GERD (gastroesophageal reflux disease)   . Hemorrhoids   . Hiatal hernia   . Lung cancer ([place])   . Neuritis/radiculitis due to displacement of lumbar intervertebral disc   .  Pneumonia   . Rib fracture     ALLERGIES:  is allergic to benzonatate; lyrica [pregabalin]; penicillins; pneumococcal vaccine polyvalent; tizanidine; and tramadol.  MEDICATIONS:  Current Outpatient Prescriptions  Medication Sig Dispense Refill  . acetaminophen (TYLENOL) 650 MG CR tablet Take 650 mg by mouth every 6 (six) hours as needed for pain or fever. Reported on 09/22/2015    . antiseptic oral rinse (BIOTENE) LIQD 15 mLs by Mouth Rinse route as needed for dry mouth.    Marland Kitchen aspirin 81 MG tablet Take 81 mg by mouth at bedtime.     . Calcium Carbonate-Vitamin D (CALCIUM + D PO) Take 1 capsule by mouth 2 (two) times daily.     . carboxymethylcellulose (REFRESH PLUS) 0.5 % SOLN 1 drop 3 (three) times daily as needed.    Marland Kitchen denosumab (PROLIA) 60 MG/ML SOLN injection Inject 60 mg into the skin every 6 (six) months. Administer in upper arm, thigh, or abdomen    . dexamethasone (DECADRON) 4 MG tablet TAKE 1 TABLET BY MOUTH TWICE A DAY DAY BEFORE DAY OF AND DAY AFTER CHEMOTHERAPY EVERY 3 WEEKS 40 tablet 1  . dextromethorphan-guaiFENesin (ROBITUSSIN-DM) 10-100 MG/5ML liquid Take 10 mLs by mouth every 4 (four) hours as needed for cough. Reported on 03/01/2016    . ferrous sulfate 325 (65 FE) MG tablet Take 325 mg by mouth 2 (two) times daily with a meal.    . folic acid (FOLVITE) 1 MG tablet TAKE 1 TABLET (1 MG TOTAL) BY MOUTH DAILY. 30 tablet 4  . guaiFENesin (MUCINEX) 600 MG 12 hr tablet Take 600 mg by mouth 2 (two) times daily as  needed.    . lactobacillus acidophilus & bulgar (LACTINEX) chewable tablet Chew 1 tablet by mouth 2 (two) times daily. Store in [place], [place]    . lidocaine-prilocaine (EMLA) cream Apply 1 application topically as needed. 30 g 3  . loratadine (CLARITIN) 10 MG tablet Take 10 mg by mouth.    . losartan (COZAAR) 100 MG tablet Take 100 mg by mouth daily.    . meloxicam (MOBIC) 7.5 MG tablet Take 7.5 mg by mouth every other day. Reported on 10/27/2015    . Multiple Vitamin  (MULTIVITAMIN) tablet Take 1 tablet by mouth daily.    Marland Kitchen nystatin (MYCOSTATIN) 100000 UNIT/ML suspension Take 5 mLs by mouth 4 (four) times daily as needed.    . Omega-3 Fatty Acids (FISH OIL) 1000 MG CAPS Take 1,000 mg by mouth daily.     Marland Kitchen oxybutynin (DITROPAN) 5 MG tablet Take 5 mg by mouth daily.    . pantoprazole (PROTONIX) 40 MG tablet Take 40 mg by mouth daily.    . polyethylene glycol (MIRALAX / GLYCOLAX) packet Take by mouth.    . Probiotic Product (ALIGN) 4 MG CAPS Take 1 capsule by mouth daily.    Marland Kitchen PROCTOSOL HC 2.5 % rectal cream INSERT INTO THE RECTUM TWO (2) TIMES A DAY.  0  . saw palmetto 160 MG capsule Take 160 mg by mouth daily.    . Tamsulosin HCl (FLOMAX) 0.4 MG CAPS Take 0.4 mg by mouth daily.    Marland Kitchen loperamide (IMODIUM) 2 MG capsule Take 2 mg by mouth as needed for diarrhea or loose stools. Reported on 03/01/2016    . magic mouthwash SOLN Take 5 mLs by mouth 3 (three) times daily as needed for mouth pain. (Patient not taking: Reported on 05/24/2016) 240 mL 1  . prochlorperazine (COMPAZINE) 10 MG tablet TAKE 1 TABLET (10 MG TOTAL) BY MOUTH EVERY 6 (SIX) HOURS AS NEEDED FOR NAUSEA OR VOMITING. (Patient not taking: Reported on 05/24/2016) 30 tablet 0   No current facility-administered medications for this visit.     SURGICAL HISTORY:  Past Surgical History:  Procedure Laterality Date  . CATARACT EXTRACTION     bilateral  . CHOLECYSTECTOMY  06/2003  . INGUINAL HERNIA REPAIR    . SPLENECTOMY  2002  . TRANSURETHRAL RESECTION OF PROSTATE      REVIEW OF SYSTEMS:  A comprehensive review of systems was negative except for: Constitutional: positive for fatigue Ears, nose, mouth, throat, and face: positive for sore mouth   PHYSICAL EXAMINATION: General appearance: alert, cooperative, fatigued and no distress Head: Normocephalic, without obvious abnormality, atraumatic Neck: no adenopathy, no JVD, supple, symmetrical, trachea midline and thyroid not enlarged, symmetric, no  tenderness/mass/nodules Lymph nodes: Cervical, supraclavicular, and axillary nodes normal. Resp: clear to auscultation bilaterally Back: symmetric, no curvature. ROM normal. No CVA tenderness. Cardio: regular rate and rhythm, S1, S2 normal, no murmur, click, rub or gallop GI: soft, non-tender; bowel sounds normal; no masses,  no organomegaly Extremities: extremities normal, atraumatic, no cyanosis or edema Neurologic: Alert and oriented X 3, normal strength and tone. Normal symmetric reflexes. Normal coordination and gait  ECOG PERFORMANCE STATUS: 1 - Symptomatic but completely ambulatory  Blood pressure (!) 165/67, pulse 66, temperature 98.1 F (36.7 C), temperature source Oral, resp. rate (!) 98, height 5' 8"  (1.727 m), weight 165 lb 9.6 oz (75.1 kg), SpO2 98 %.  LABORATORY DATA: Lab Results  Component Value Date   WBC 11.1 (H) 05/24/2016   HGB 10.4 (L) 05/24/2016  HCT 29.8 (L) 05/24/2016   MCV 97.1 05/24/2016   PLT 467 (H) 05/24/2016      Chemistry      Component Value Date/Time   NA 127 (L) 05/24/2016 1110   K 4.3 05/24/2016 1110   CL 100 (L) 08/30/2015 1150   CO2 25 05/24/2016 1110   BUN 11.7 05/24/2016 1110   CREATININE 0.7 05/24/2016 1110      Component Value Date/Time   CALCIUM 10.0 05/24/2016 1110   ALKPHOS 72 05/24/2016 1110   AST 35 (H) 05/24/2016 1110   ALT 23 05/24/2016 1110   BILITOT 0.56 05/24/2016 1110       RADIOGRAPHIC STUDIES: Ct Chest W Contrast  Result Date: 05/03/2016 CLINICAL DATA:  82 year old male with history of right-sided lung cancer originally diagnosed in December 2016 undergoing ongoing chemotherapy. Fatigue. EXAM: CT CHEST WITH CONTRAST TECHNIQUE: Multidetector CT imaging of the chest was performed during intravenous contrast administration. CONTRAST:  27m ISOVUE-300 IOPAMIDOL (ISOVUE-300) INJECTION 61% COMPARISON:  Chest CT 02/28/2016. FINDINGS: Cardiovascular: Heart size is normal. Small amount of pericardial fluid, slightly decreased  compared to the prior study, and unlikely to be of hemodynamic significance at this time. No associated pericardial calcification. There is aortic atherosclerosis, as well as atherosclerosis of the great vessels of the mediastinum and the coronary arteries, including calcified atherosclerotic plaque in the left main, left anterior descending, left circumflex and right coronary arteries. Right internal jugular single-lumen porta cath with tip terminating at the superior cavoatrial junction. Mediastinum/Nodes: No pathologically enlarged mediastinal or hilar lymph nodes. Esophagus is unremarkable in appearance. No axillary lymphadenopathy. Lungs/Pleura: There is again an area of architectural distortion and volume loss in the right middle lobe which appears similar to several prior examinations, which is most compatible with scarring at site of treated right middle lobe neoplasm. No new suspicious appearing pulmonary nodules or masses are noted. No acute consolidative airspace disease. No pleural effusions. Upper Abdomen: Postoperative changes of prior splenectomy. Multiple splenules are noted in the left upper quadrant. Small ventral hernia in the epigastric region containing a small amount of omental fat. Status post cholecystectomy. Aortic atherosclerosis. Musculoskeletal: There are no aggressive appearing lytic or blastic lesions noted in the visualized portions of the skeleton. IMPRESSION: 1. Chronic scarring in the right middle lobe at site of treated neoplasm, without definitive evidence to suggest local recurrence or new metastatic disease in the thorax. 2. Slight decreased size of small pericardial effusion. 3. Aortic atherosclerosis, in addition to left main and 3 vessel coronary artery disease. Additional incidental findings, as above. Electronically Signed   By: DVinnie LangtonM.D.   On: 05/03/2016 16:20    ASSESSMENT AND PLAN: This is a very pleasant 82years old white male with a stage IIIB non-small  cell lung cancer with negative EGFR mutation, negative ALK gene translocation and PDL 1 expression 10%. The patient is not eligible for concurrent chemoradiation because of the large radiotherapy field. The patient completed induction systemic chemotherapy with carboplatin and Alimta status post 6 cycles. He tolerated his treatment well except for fatigue and sore mouth. He is currently undergoing maintenance systemic chemotherapy with single agent Alimta status post 7 cycles and tolerating it well. I recommended for the patient to proceed with cycle #8 today as scheduled.  He'll come back for follow-up visit in 3 weeks for evaluation before starting cycle #9. For hypertension, I strongly recommended for the patient to reconsult with his primary care physician for adjustment of his antihypertensive medication. He was advised  to call immediately if he has any concerning symptoms in the interval. The patient voices understanding of current disease status and treatment options and is in agreement with the current care plan.  All questions were answered. The patient knows to call the clinic with any problems, questions or concerns. We can certainly see the patient much sooner if necessary.  Disclaimer: This note was dictated with voice recognition software. Similar sounding words can inadvertently be transcribed and may not be corrected upon review.

## 2016-05-24 NOTE — Telephone Encounter (Signed)
Message sent to chemo scheduler to add chemo. Avs report and appointment schedule given to patient, per 05/24/16 los.

## 2016-05-25 ENCOUNTER — Telehealth: Payer: Self-pay | Admitting: *Deleted

## 2016-05-25 ENCOUNTER — Other Ambulatory Visit: Payer: Self-pay | Admitting: Medical Oncology

## 2016-05-25 DIAGNOSIS — K121 Other forms of stomatitis: Secondary | ICD-10-CM

## 2016-05-25 MED ORDER — FIRST-DUKES MOUTHWASH MT SUSP: 5.0000 mL | Freq: Four times a day (QID) | OROMUCOSAL | 1 refills | Status: DC | PRN

## 2016-05-25 NOTE — Telephone Encounter (Signed)
Per LOS I have scheduled appts and notified the scheduler 

## 2016-05-29 MED FILL — MAGIC MOUTHWASH BOP FORM: 12 days supply | Qty: 240 | Fill #0

## 2016-05-30 ENCOUNTER — Telehealth: Payer: Self-pay | Admitting: Medical Oncology

## 2016-05-30 NOTE — Telephone Encounter (Signed)
I spoke to Sean Cooke at [facility] and pt cost for magic mouthwash is $5. I told wife it is ready for pick up and notified pt too.

## 2016-05-30 NOTE — Telephone Encounter (Signed)
Ordered mouthwash for stomatitis.

## 2016-06-14 ENCOUNTER — Other Ambulatory Visit (HOSPITAL_BASED_OUTPATIENT_CLINIC_OR_DEPARTMENT_OTHER): Payer: PPO

## 2016-06-14 ENCOUNTER — Ambulatory Visit (HOSPITAL_BASED_OUTPATIENT_CLINIC_OR_DEPARTMENT_OTHER): Payer: PPO | Admitting: Nurse Practitioner

## 2016-06-14 ENCOUNTER — Ambulatory Visit (HOSPITAL_BASED_OUTPATIENT_CLINIC_OR_DEPARTMENT_OTHER): Payer: PPO

## 2016-06-14 VITALS — BP 157/73 | HR 74 | Temp 98.1°F | Resp 18 | Ht 68.0 in | Wt 165.0 lb

## 2016-06-14 DIAGNOSIS — C3491 Malignant neoplasm of unspecified part of right bronchus or lung: Secondary | ICD-10-CM

## 2016-06-14 DIAGNOSIS — Z5111 Encounter for antineoplastic chemotherapy: Secondary | ICD-10-CM

## 2016-06-14 DIAGNOSIS — C342 Malignant neoplasm of middle lobe, bronchus or lung: Secondary | ICD-10-CM | POA: Diagnosis not present

## 2016-06-14 DIAGNOSIS — C3411 Malignant neoplasm of upper lobe, right bronchus or lung: Secondary | ICD-10-CM

## 2016-06-14 DIAGNOSIS — D649 Anemia, unspecified: Secondary | ICD-10-CM

## 2016-06-14 DIAGNOSIS — I10 Essential (primary) hypertension: Secondary | ICD-10-CM | POA: Diagnosis not present

## 2016-06-14 DIAGNOSIS — C3412 Malignant neoplasm of upper lobe, left bronchus or lung: Secondary | ICD-10-CM

## 2016-06-14 DIAGNOSIS — K5900 Constipation, unspecified: Secondary | ICD-10-CM

## 2016-06-14 LAB — COMPREHENSIVE METABOLIC PANEL
ALT: 34 U/L (ref 0–55)
ANION GAP: 9 meq/L (ref 3–11)
AST: 40 U/L — AB (ref 5–34)
Albumin: 3.4 g/dL — ABNORMAL LOW (ref 3.5–5.0)
Alkaline Phosphatase: 75 U/L (ref 40–150)
BILIRUBIN TOTAL: 0.61 mg/dL (ref 0.20–1.20)
BUN: 13.4 mg/dL (ref 7.0–26.0)
CALCIUM: 9.9 mg/dL (ref 8.4–10.4)
CHLORIDE: 96 meq/L — AB (ref 98–109)
CO2: 24 meq/L (ref 22–29)
CREATININE: 0.7 mg/dL (ref 0.7–1.3)
EGFR: 85 mL/min/{1.73_m2} — ABNORMAL LOW (ref >90)
Glucose: 131 mg/dl (ref 70–140)
Potassium: 4.9 mEq/L (ref 3.5–5.1)
Sodium: 128 mEq/L — ABNORMAL LOW (ref 136–145)
TOTAL PROTEIN: 6.5 g/dL (ref 6.4–8.3)

## 2016-06-14 LAB — CBC WITH DIFFERENTIAL/PLATELET
BASO%: 0.1 % (ref 0.0–2.0)
Basophils Absolute: 0 10*3/uL (ref 0.0–0.1)
EOS%: 0.1 % (ref 0.0–7.0)
Eosinophils Absolute: 0 10*3/uL (ref 0.0–0.5)
HEMATOCRIT: 30.4 % — AB (ref 38.4–49.9)
HGB: 10.5 g/dL — ABNORMAL LOW (ref 13.0–17.1)
LYMPH#: 1.1 10*3/uL (ref 0.9–3.3)
LYMPH%: 7.9 % — ABNORMAL LOW (ref 14.0–49.0)
MCH: 34 pg — ABNORMAL HIGH (ref 27.2–33.4)
MCHC: 34.5 g/dL (ref 32.0–36.0)
MCV: 98.4 fL — ABNORMAL HIGH (ref 79.3–98.0)
MONO#: 1.6 10*3/uL — AB (ref 0.1–0.9)
MONO%: 12 % (ref 0.0–14.0)
NEUT%: 79.9 % — AB (ref 39.0–75.0)
NEUTROS ABS: 10.8 10*3/uL — AB (ref 1.5–6.5)
PLATELETS: 496 10*3/uL — AB (ref 140–400)
RBC: 3.09 10*6/uL — AB (ref 4.20–5.82)
RDW: 13.8 % (ref 11.0–14.6)
WBC: 13.5 10*3/uL — AB (ref 4.0–10.3)

## 2016-06-14 MED ORDER — SODIUM CHLORIDE 0.9 % IV SOLN
Freq: Once | INTRAVENOUS | Status: AC
  Administered 2016-06-14: 13:00:00 via INTRAVENOUS

## 2016-06-14 MED ORDER — SODIUM CHLORIDE 0.9% FLUSH
10.0000 mL | INTRAVENOUS | Status: DC | PRN
  Administered 2016-06-14: 10 mL
  Filled 2016-06-14: qty 10

## 2016-06-14 MED ORDER — HEPARIN SOD (PORK) LOCK FLUSH 100 UNIT/ML IV SOLN
500.0000 [IU] | Freq: Once | INTRAVENOUS | Status: AC | PRN
  Administered 2016-06-14: 500 [IU]
  Filled 2016-06-14: qty 5

## 2016-06-14 MED ORDER — PROCHLORPERAZINE MALEATE 10 MG PO TABS
ORAL_TABLET | ORAL | Status: AC
  Filled 2016-06-14: qty 1

## 2016-06-14 MED ORDER — CYANOCOBALAMIN 1000 MCG/ML IJ SOLN
1000.0000 ug | Freq: Once | INTRAMUSCULAR | Status: AC
  Administered 2016-06-14: 1000 ug via INTRAMUSCULAR

## 2016-06-14 MED ORDER — PROCHLORPERAZINE MALEATE 10 MG PO TABS
10.0000 mg | ORAL_TABLET | Freq: Once | ORAL | Status: AC
  Administered 2016-06-14: 10 mg via ORAL

## 2016-06-14 MED ORDER — CYANOCOBALAMIN 1000 MCG/ML IJ SOLN
INTRAMUSCULAR | Status: AC
  Filled 2016-06-14: qty 1

## 2016-06-14 MED ORDER — SODIUM CHLORIDE 0.9 % IV SOLN
500.0000 mg/m2 | Freq: Once | INTRAVENOUS | Status: AC
  Administered 2016-06-14: 950 mg via INTRAVENOUS
  Filled 2016-06-14: qty 38

## 2016-06-14 NOTE — Progress Notes (Signed)
[facility] Telephone:([phone]   Fax:(336) [phone]  OFFICE PROGRESS NOTE  [name], MD [address] [place] [address]  DIAGNOSIS: stage IIIB (T1a, N3, M0) non-small cell lung cancer, adenocarcinoma with negative EGFR mutation, negative ALKgene translocation, negative ROS 1, but positive RET diagnosed in November 2016 and presented with right middle lobe lung nodule in addition to mediastinal and left supraclavicular lymphadenopathy.  PRIOR THERAPY: Systemic chemotherapy with carboplatin for AUC of 5 and Alimta 500 MG/M2 every 3 weeks, first dose 08/11/2015. Status post 6 cycles with partial response.  CURRENT THERAPY: Maintenance Systemic chemotherapy with single agent Alimta 500 MG/M2 every 3 weeks, first dose 12/22/2015. Status post 8 cycles.  INTERVAL HISTORY: Sean Cooke 82 y.o. male returns to the clinic today for follow-up visit. The patient is feeling fine today with no specific complaints. He is currently on maintenance systemic chemotherapy with single agent Alimta status post 8 cycles and tolerating it well except for fatigue and mouth sores. He is currently on Magic mouthwash. He states he has felt colder than usual in general all the time since starting chemo, but he denied having any significant fever or chills. He has no nausea or vomiting. He denied having any significant chest pain, shortness of breath, cough or hemoptysis. He denied having any significant weight loss or night sweats. He is here today for evaluation before starting cycle #9.  MEDICAL HISTORY: Past Medical History:  Diagnosis Date  . Adenomatous colon polyp 05/2003  . Arthritis   . Degenerative joint disease   . Diverticulosis   . Duodenal diverticulum   . Encounter for antineoplastic chemotherapy 07/31/2015  . GERD (gastroesophageal reflux disease)   . Hemorrhoids   . Hiatal hernia   . Lung cancer ([place])   . Neuritis/radiculitis due to  displacement of lumbar intervertebral disc   . Pneumonia   . Rib fracture     ALLERGIES:  is allergic to benzonatate; lyrica [pregabalin]; penicillins; pneumococcal vaccine polyvalent; tizanidine; and tramadol.  MEDICATIONS:  Current Outpatient Prescriptions  Medication Sig Dispense Refill  . acetaminophen (TYLENOL) 650 MG CR tablet Take 650 mg by mouth every 6 (six) hours as needed for pain or fever. Reported on 09/22/2015    . antiseptic oral rinse (BIOTENE) LIQD 15 mLs by Mouth Rinse route as needed for dry mouth.    Marland Kitchen aspirin 81 MG tablet Take 81 mg by mouth at bedtime.     . Calcium Carbonate-Vitamin D (CALCIUM + D PO) Take 1 capsule by mouth 2 (two) times daily.     . carboxymethylcellulose (REFRESH PLUS) 0.5 % SOLN 1 drop 3 (three) times daily as needed.    Marland Kitchen denosumab (PROLIA) 60 MG/ML SOLN injection Inject 60 mg into the skin every 6 (six) months. Administer in upper arm, thigh, or abdomen    . dexamethasone (DECADRON) 4 MG tablet TAKE 1 TABLET BY MOUTH TWICE A DAY DAY BEFORE DAY OF AND DAY AFTER CHEMOTHERAPY EVERY 3 WEEKS 40 tablet 1  . dextromethorphan-guaiFENesin (ROBITUSSIN-DM) 10-100 MG/5ML liquid Take 10 mLs by mouth every 4 (four) hours as needed for cough. Reported on 03/01/2016    . Diphenhyd-Hydrocort-Nystatin (FIRST-DUKES MOUTHWASH) SUSP Use as directed 5 mLs in the mouth or throat 4 (four) times daily as needed. 240 mL 1  . ferrous sulfate 325 (65 FE) MG tablet Take 325 mg by mouth 2 (two) times daily with a meal.    . folic acid (FOLVITE) 1 MG  tablet TAKE 1 TABLET (1 MG TOTAL) BY MOUTH DAILY. 30 tablet 4  . guaiFENesin (MUCINEX) 600 MG 12 hr tablet Take 600 mg by mouth 2 (two) times daily as needed.    . lactobacillus acidophilus & bulgar (LACTINEX) chewable tablet Chew 1 tablet by mouth 2 (two) times daily. Store in [place], [place]    . lidocaine-prilocaine (EMLA) cream Apply 1 application topically as needed. 30 g 3  . loperamide (IMODIUM) 2 MG capsule Take 2 mg by mouth as  needed for diarrhea or loose stools. Reported on 03/01/2016    . loratadine (CLARITIN) 10 MG tablet Take 10 mg by mouth.    . losartan (COZAAR) 100 MG tablet Take 100 mg by mouth daily.    . magic mouthwash SOLN Take 5 mLs by mouth 3 (three) times daily as needed for mouth pain. (Patient not taking: Reported on 05/24/2016) 240 mL 1  . meloxicam (MOBIC) 7.5 MG tablet Take 7.5 mg by mouth every other day. Reported on 10/27/2015    . Multiple Vitamin (MULTIVITAMIN) tablet Take 1 tablet by mouth daily.    Marland Kitchen nystatin (MYCOSTATIN) 100000 UNIT/ML suspension Take 5 mLs by mouth 4 (four) times daily as needed.    . Omega-3 Fatty Acids (FISH OIL) 1000 MG CAPS Take 1,000 mg by mouth daily.     Marland Kitchen oxybutynin (DITROPAN) 5 MG tablet Take 5 mg by mouth daily.    . pantoprazole (PROTONIX) 40 MG tablet Take 40 mg by mouth daily.    . polyethylene glycol (MIRALAX / GLYCOLAX) packet Take by mouth.    . Probiotic Product (ALIGN) 4 MG CAPS Take 1 capsule by mouth daily.    . prochlorperazine (COMPAZINE) 10 MG tablet TAKE 1 TABLET (10 MG TOTAL) BY MOUTH EVERY 6 (SIX) HOURS AS NEEDED FOR NAUSEA OR VOMITING. (Patient not taking: Reported on 05/24/2016) 30 tablet 0  . PROCTOSOL HC 2.5 % rectal cream INSERT INTO THE RECTUM TWO (2) TIMES A DAY.  0  . saw palmetto 160 MG capsule Take 160 mg by mouth daily.    . Tamsulosin HCl (FLOMAX) 0.4 MG CAPS Take 0.4 mg by mouth daily.     No current facility-administered medications for this visit.     SURGICAL HISTORY:  Past Surgical History:  Procedure Laterality Date  . CATARACT EXTRACTION     bilateral  . CHOLECYSTECTOMY  06/2003  . INGUINAL HERNIA REPAIR    . SPLENECTOMY  2002  . TRANSURETHRAL RESECTION OF PROSTATE      REVIEW OF SYSTEMS:  A comprehensive review of systems was negative except for: Constitutional: positive for fatigue Ears, nose, mouth, throat, and face: positive for sore mouth   PHYSICAL EXAMINATION: General appearance: alert, cooperative, fatigued and  no distress Head: Normocephalic, without obvious abnormality, atraumatic Neck: no adenopathy, no JVD, supple, symmetrical, trachea midline and thyroid not enlarged, symmetric, no tenderness/mass/nodules Lymph nodes: Cervical, supraclavicular, and axillary nodes normal. Resp: clear to auscultation bilaterally Back: symmetric, no curvature. ROM normal. No CVA tenderness. Cardio: regular rate and rhythm, S1, S2 normal, no murmur, click, rub or gallop GI: soft, non-tender; bowel sounds normal; no masses,  no organomegaly Extremities: extremities normal, atraumatic, no cyanosis or edema Neurologic: Alert and oriented X 3, normal strength and tone. Normal symmetric reflexes. Normal coordination and gait  ECOG PERFORMANCE STATUS: 1 - Symptomatic but completely ambulatory  Blood pressure (!) 157/73, pulse 74, temperature 98.1 F (36.7 C), temperature source Oral, resp. rate 18, height 5' 8"  (1.727 m), weight 165  lb (74.8 kg), SpO2 100 %.  LABORATORY DATA: Lab Results  Component Value Date   WBC 13.5 (H) 06/14/2016   HGB 10.5 (L) 06/14/2016   HCT 30.4 (L) 06/14/2016   MCV 98.4 (H) 06/14/2016   PLT 496 (H) 06/14/2016      Chemistry      Component Value Date/Time   NA 127 (L) 05/24/2016 1110   K 4.3 05/24/2016 1110   CL 100 (L) 08/30/2015 1150   CO2 25 05/24/2016 1110   BUN 11.7 05/24/2016 1110   CREATININE 0.7 05/24/2016 1110      Component Value Date/Time   CALCIUM 10.0 05/24/2016 1110   ALKPHOS 72 05/24/2016 1110   AST 35 (H) 05/24/2016 1110   ALT 23 05/24/2016 1110   BILITOT 0.56 05/24/2016 1110       RADIOGRAPHIC STUDIES: No results found.  ASSESSMENT AND PLAN: This is a very pleasant 82 years old white male with a stage IIIB non-small cell lung cancer with negative EGFR mutation, negative ALK gene translocation and PDL 1 expression 10%. The patient is not eligible for concurrent chemoradiation because of the large radiotherapy field. The patient completed induction  systemic chemotherapy with carboplatin and Alimta status post 6 cycles. He tolerated his treatment well except for fatigue and sore mouth. He has no obvious sores in his mouth.  He is using mouthwash regularly.  He does state he stays cold almost all the time since starting chemo, but denies fevers and chills.   He is currently undergoing maintenance systemic chemotherapy with single agent Alimta status post 8 cycles and tolerating it well. I recommended for the patient to proceed with cycle #9 today as scheduled.  He'll come back for follow-up visit in 3 weeks on 07/05/2016  for evaluation before starting cycle #10.  For hypertension, I strongly recommended for the patient to reconsult with his primary care physician for adjustment of his antihypertensive medication. He was advised to call immediately if he has any concerning symptoms in the interval. The patient voices understanding of current disease status and treatment options and is in agreement with the current care plan.  All questions were answered. The patient knows to call the clinic with any problems, questions or concerns. We can certainly see the patient much sooner if necessary.  Disclaimer: This note was dictated with voice recognition software. Similar sounding words can inadvertently be transcribed and may not be corrected upon review.  Burns Spain, AOCNP, NP-C

## 2016-06-14 NOTE — Patient Instructions (Signed)
[facility] Discharge Instructions for Patients Receiving Chemotherapy  Today you received the following chemotherapy agents Alimta.  To help prevent nausea and vomiting after your treatment, we encourage you to take your nausea medication as prescribed.   If you develop nausea and vomiting that is not controlled by your nausea medication, call the clinic.   BELOW ARE SYMPTOMS THAT SHOULD BE REPORTED IMMEDIATELY:  *FEVER GREATER THAN 100.5 F  *CHILLS WITH OR WITHOUT FEVER  NAUSEA AND VOMITING THAT IS NOT CONTROLLED WITH YOUR NAUSEA MEDICATION  *UNUSUAL SHORTNESS OF BREATH  *UNUSUAL BRUISING OR BLEEDING  TENDERNESS IN MOUTH AND THROAT WITH OR WITHOUT PRESENCE OF ULCERS  *URINARY PROBLEMS  *BOWEL PROBLEMS  UNUSUAL RASH Items with * indicate a potential emergency and should be followed up as soon as possible.  Feel free to call the clinic you have any questions or concerns. The clinic phone number is [phone].  Please show the CHEMO ALERT CARD at check-in to the Emergency Department and triage nurse.   

## 2016-06-26 ENCOUNTER — Telehealth: Payer: Self-pay | Admitting: *Deleted

## 2016-06-26 ENCOUNTER — Other Ambulatory Visit: Payer: Self-pay | Admitting: Medical Oncology

## 2016-06-26 DIAGNOSIS — K121 Other forms of stomatitis: Secondary | ICD-10-CM

## 2016-06-26 MED ORDER — MAGIC MOUTHWASH: 5.0000 mL | Freq: Three times a day (TID) | ORAL | 1 refills | Status: DC | PRN

## 2016-06-26 NOTE — Telephone Encounter (Signed)
Call received @ 110  Pt. called in regards to needing a refill on his  MAGIC MOUTHWASH. He said that his Pharmacy told him that it was denied by MD. Call was transferred to vmail/message left.

## 2016-06-26 NOTE — Telephone Encounter (Signed)
Pt.notified

## 2016-06-26 NOTE — Telephone Encounter (Signed)
Called in refill -hydrocortisone 60 mg, nystatin 30 ml and benadryl 12.5 mg/5 ml . To quantity of 249 ml with 1 refill. Take 5 ml 3-4 times a day as needed for mouth pain.

## 2016-06-27 MED FILL — MAGIC MOUTHWASH BOP FORM: 12 days supply | Qty: 240 | Fill #0

## 2016-06-28 DIAGNOSIS — I10 Essential (primary) hypertension: Secondary | ICD-10-CM | POA: Diagnosis not present

## 2016-06-28 DIAGNOSIS — R7303 Prediabetes: Secondary | ICD-10-CM | POA: Diagnosis not present

## 2016-06-28 DIAGNOSIS — D473 Essential (hemorrhagic) thrombocythemia: Secondary | ICD-10-CM | POA: Diagnosis not present

## 2016-06-28 DIAGNOSIS — E871 Hypo-osmolality and hyponatremia: Secondary | ICD-10-CM | POA: Diagnosis not present

## 2016-07-03 ENCOUNTER — Encounter (HOSPITAL_COMMUNITY): Payer: Self-pay

## 2016-07-03 ENCOUNTER — Ambulatory Visit (HOSPITAL_COMMUNITY)
Admission: RE | Admit: 2016-07-03 | Discharge: 2016-07-03 | Disposition: A | Discharge status: Home / Self Care | Payer: PPO | Source: Ambulatory Visit | Attending: Internal Medicine | Admitting: Internal Medicine

## 2016-07-03 DIAGNOSIS — I2510 Atherosclerotic heart disease of native coronary artery without angina pectoris: Secondary | ICD-10-CM | POA: Insufficient documentation

## 2016-07-03 DIAGNOSIS — C3411 Malignant neoplasm of upper lobe, right bronchus or lung: Secondary | ICD-10-CM | POA: Insufficient documentation

## 2016-07-03 DIAGNOSIS — I313 Pericardial effusion (noninflammatory): Secondary | ICD-10-CM | POA: Insufficient documentation

## 2016-07-03 DIAGNOSIS — Z5111 Encounter for antineoplastic chemotherapy: Secondary | ICD-10-CM | POA: Diagnosis not present

## 2016-07-03 DIAGNOSIS — C3490 Malignant neoplasm of unspecified part of unspecified bronchus or lung: Secondary | ICD-10-CM | POA: Diagnosis not present

## 2016-07-03 MED ORDER — IOPAMIDOL (ISOVUE-300) INJECTION 61%
INTRAVENOUS | Status: AC
  Administered 2016-07-03: 75 mL
  Filled 2016-07-03: qty 75

## 2016-07-04 DIAGNOSIS — R351 Nocturia: Secondary | ICD-10-CM | POA: Diagnosis not present

## 2016-07-04 DIAGNOSIS — N401 Enlarged prostate with lower urinary tract symptoms: Secondary | ICD-10-CM | POA: Diagnosis not present

## 2016-07-05 ENCOUNTER — Other Ambulatory Visit (HOSPITAL_BASED_OUTPATIENT_CLINIC_OR_DEPARTMENT_OTHER): Payer: PPO

## 2016-07-05 ENCOUNTER — Encounter: Payer: Self-pay | Admitting: Internal Medicine

## 2016-07-05 ENCOUNTER — Ambulatory Visit (HOSPITAL_BASED_OUTPATIENT_CLINIC_OR_DEPARTMENT_OTHER): Payer: PPO

## 2016-07-05 ENCOUNTER — Ambulatory Visit (HOSPITAL_BASED_OUTPATIENT_CLINIC_OR_DEPARTMENT_OTHER): Payer: PPO | Admitting: Internal Medicine

## 2016-07-05 ENCOUNTER — Telehealth: Payer: Self-pay | Admitting: Internal Medicine

## 2016-07-05 VITALS — BP 154/75 | HR 82 | Temp 97.6°F | Resp 16 | Ht 68.0 in | Wt 164.3 lb

## 2016-07-05 DIAGNOSIS — Z5111 Encounter for antineoplastic chemotherapy: Secondary | ICD-10-CM | POA: Diagnosis not present

## 2016-07-05 DIAGNOSIS — C342 Malignant neoplasm of middle lobe, bronchus or lung: Secondary | ICD-10-CM

## 2016-07-05 DIAGNOSIS — I10 Essential (primary) hypertension: Secondary | ICD-10-CM | POA: Diagnosis not present

## 2016-07-05 DIAGNOSIS — C3411 Malignant neoplasm of upper lobe, right bronchus or lung: Secondary | ICD-10-CM

## 2016-07-05 DIAGNOSIS — C3491 Malignant neoplasm of unspecified part of right bronchus or lung: Secondary | ICD-10-CM

## 2016-07-05 LAB — CBC WITH DIFFERENTIAL/PLATELET
BASO%: 0 % (ref 0.0–2.0)
Basophils Absolute: 0 10*3/uL (ref 0.0–0.1)
EOS%: 0.1 % (ref 0.0–7.0)
Eosinophils Absolute: 0 10*3/uL (ref 0.0–0.5)
HCT: 29.3 % — ABNORMAL LOW (ref 38.4–49.9)
HGB: 10.1 g/dL — ABNORMAL LOW (ref 13.0–17.1)
LYMPH%: 6.5 % — AB (ref 14.0–49.0)
MCH: 33.9 pg — ABNORMAL HIGH (ref 27.2–33.4)
MCHC: 34.5 g/dL (ref 32.0–36.0)
MCV: 98.3 fL — ABNORMAL HIGH (ref 79.3–98.0)
MONO#: 1.9 10*3/uL — ABNORMAL HIGH (ref 0.1–0.9)
MONO%: 15.6 % — AB (ref 0.0–14.0)
NEUT%: 77.8 % — AB (ref 39.0–75.0)
NEUTROS ABS: 9.3 10*3/uL — AB (ref 1.5–6.5)
Platelets: 569 10*3/uL — ABNORMAL HIGH (ref 140–400)
RBC: 2.98 10*6/uL — AB (ref 4.20–5.82)
RDW: 13.3 % (ref 11.0–14.6)
WBC: 12 10*3/uL — AB (ref 4.0–10.3)
lymph#: 0.8 10*3/uL — ABNORMAL LOW (ref 0.9–3.3)

## 2016-07-05 LAB — COMPREHENSIVE METABOLIC PANEL
ALT: 24 U/L (ref 0–55)
AST: 34 U/L (ref 5–34)
Albumin: 3.2 g/dL — ABNORMAL LOW (ref 3.5–5.0)
Alkaline Phosphatase: 85 U/L (ref 40–150)
Anion Gap: 9 mEq/L (ref 3–11)
BILIRUBIN TOTAL: 0.52 mg/dL (ref 0.20–1.20)
BUN: 11.7 mg/dL (ref 7.0–26.0)
CHLORIDE: 94 meq/L — AB (ref 98–109)
CO2: 25 meq/L (ref 22–29)
CREATININE: 0.7 mg/dL (ref 0.7–1.3)
Calcium: 10.5 mg/dL — ABNORMAL HIGH (ref 8.4–10.4)
EGFR: 86 mL/min/{1.73_m2} — AB (ref >90)
GLUCOSE: 125 mg/dL (ref 70–140)
Potassium: 4.2 mEq/L (ref 3.5–5.1)
SODIUM: 128 meq/L — AB (ref 136–145)
TOTAL PROTEIN: 6.6 g/dL (ref 6.4–8.3)

## 2016-07-05 MED ORDER — SODIUM CHLORIDE 0.9% FLUSH
10.0000 mL | INTRAVENOUS | Status: DC | PRN
  Administered 2016-07-05: 10 mL
  Filled 2016-07-05: qty 10

## 2016-07-05 MED ORDER — PROCHLORPERAZINE MALEATE 10 MG PO TABS
ORAL_TABLET | ORAL | Status: AC
  Filled 2016-07-05: qty 1

## 2016-07-05 MED ORDER — PROCHLORPERAZINE MALEATE 10 MG PO TABS
10.0000 mg | ORAL_TABLET | Freq: Once | ORAL | Status: AC
  Administered 2016-07-05: 10 mg via ORAL

## 2016-07-05 MED ORDER — SODIUM CHLORIDE 0.9 % IV SOLN
500.0000 mL | Freq: Once | INTRAVENOUS | Status: AC
  Administered 2016-07-05: 500 mL via INTRAVENOUS

## 2016-07-05 MED ORDER — SODIUM CHLORIDE 0.9 % IV SOLN
Freq: Once | INTRAVENOUS | Status: AC
  Administered 2016-07-05: 12:00:00 via INTRAVENOUS

## 2016-07-05 MED ORDER — PEMETREXED DISODIUM CHEMO INJECTION 500 MG
500.0000 mg/m2 | Freq: Once | INTRAVENOUS | Status: AC
  Administered 2016-07-05: 950 mg via INTRAVENOUS
  Filled 2016-07-05: qty 38

## 2016-07-05 MED ORDER — HEPARIN SOD (PORK) LOCK FLUSH 100 UNIT/ML IV SOLN
500.0000 [IU] | Freq: Once | INTRAVENOUS | Status: AC | PRN
Start: 2016-07-05 — End: 2016-07-05
  Administered 2016-07-05: 500 [IU]
  Filled 2016-07-05: qty 5

## 2016-07-05 NOTE — Telephone Encounter (Signed)
Appointments scheduled per 11/22 LOS. Patient given AVS report and calendars with future scheduled appointments.

## 2016-07-05 NOTE — Patient Instructions (Signed)
[facility] Discharge Instructions for Patients Receiving Chemotherapy  Today you received the following chemotherapy agents Alimta.  To help prevent nausea and vomiting after your treatment, we encourage you to take your nausea medication as prescribed.   If you develop nausea and vomiting that is not controlled by your nausea medication, call the clinic.   BELOW ARE SYMPTOMS THAT SHOULD BE REPORTED IMMEDIATELY:  *FEVER GREATER THAN 100.5 F  *CHILLS WITH OR WITHOUT FEVER  NAUSEA AND VOMITING THAT IS NOT CONTROLLED WITH YOUR NAUSEA MEDICATION  *UNUSUAL SHORTNESS OF BREATH  *UNUSUAL BRUISING OR BLEEDING  TENDERNESS IN MOUTH AND THROAT WITH OR WITHOUT PRESENCE OF ULCERS  *URINARY PROBLEMS  *BOWEL PROBLEMS  UNUSUAL RASH Items with * indicate a potential emergency and should be followed up as soon as possible.  Feel free to call the clinic you have any questions or concerns. The clinic phone number is [phone].  Please show the CHEMO ALERT CARD at check-in to the Emergency Department and triage nurse.   

## 2016-07-05 NOTE — Progress Notes (Signed)
Corr Ca++ = 11.14 today. I s/w pt in infusion re: Ca++ suppl.  He will hold Ca++ suppl for 3 weeks until we reassess serum Ca++ level. He does not drink a lot of milk or eat excessive amts of Ca++ in his diet. Additional NS 500 mL given in infusion per MD. Kennith Center, Pharm.D., CPP 07/05/2016'@2'$ :40 PM

## 2016-07-05 NOTE — Progress Notes (Signed)
Sean [phone]   [phone]  OFFICE PROGRESS NOTE  Sean Cooke., MD [address] [place] [address]  DIAGNOSIS: stage IIIB (T1a, N3, M0) non-small cell lung cancer, adenocarcinoma with negative EGFR mutation, negative ALKgene translocation, negative ROS 1, but positive RET diagnosed in November 2016 and presented with right middle lobe lung nodule in addition to mediastinal and left supraclavicular lymphadenopathy.  PRIOR THERAPY: Systemic chemotherapy with carboplatin for AUC of 5 and Alimta 500 MG/M2 every 3 weeks, first dose 08/11/2015. Status post 6 cycles with partial response.  CURRENT THERAPY: Maintenance Systemic chemotherapy with single agent Alimta 500 MG/M2 every 3 weeks, first dose 12/22/2015. Status post 9 cycles.  INTERVAL HISTORY: Sean Cooke 82 y.o. male returns to the clinic today for follow-up visit. The patient is feeling fine today with no specific complaints. He is currently on maintenance systemic chemotherapy with single agent Alimta status post 9 cycles and tolerating it well except for fatigue and mouth sores. He is currently undergoing treatment for gum disease by his dentist. He denied having any significant fever or chills. He has no nausea or vomiting. He denied having any significant chest pain, shortness of breath, cough or hemoptysis. He denied having any significant weight loss or night sweats. He had repeat CT scan of the chest performed recently and he is here for evaluation and discussion of his scan results  MEDICAL HISTORY: Past Medical History:  Diagnosis Date  . Adenomatous colon polyp 05/2003  . Arthritis   . Degenerative joint disease   . Diverticulosis   . Duodenal diverticulum   . Encounter for antineoplastic chemotherapy 07/31/2015  . GERD (gastroesophageal reflux disease)   . Hemorrhoids   . Hiatal hernia   . Lung cancer ([place])   . Neuritis/radiculitis due to  displacement of lumbar intervertebral disc   . Pneumonia   . Rib fracture     ALLERGIES:  is allergic to benzonatate; lyrica [pregabalin]; penicillins; pneumococcal vaccine polyvalent; tizanidine; and tramadol.  MEDICATIONS:  Current Outpatient Prescriptions  Medication Sig Dispense Refill  . acetaminophen (TYLENOL) 650 MG CR tablet Take 650 mg by mouth every 6 (six) hours as needed for pain or fever. Reported on 09/22/2015    . antiseptic oral rinse (BIOTENE) LIQD 15 mLs by Mouth Rinse route as needed for dry mouth.    Marland Kitchen aspirin 81 MG tablet Take 81 mg by mouth at bedtime.     . Calcium Carbonate-Vitamin D (CALCIUM + D PO) Take 1 capsule by mouth 2 (two) times daily.     . carboxymethylcellulose (REFRESH PLUS) 0.5 % SOLN 1 drop 3 (three) times daily as needed.    Marland Kitchen denosumab (PROLIA) 60 MG/ML SOLN injection Inject 60 mg into the skin every 6 (six) months. Administer in upper arm, thigh, or abdomen    . dexamethasone (DECADRON) 4 MG tablet TAKE 1 TABLET BY MOUTH TWICE A DAY DAY BEFORE DAY OF AND DAY AFTER CHEMOTHERAPY EVERY 3 WEEKS 40 tablet 1  . ferrous sulfate 325 (65 FE) MG tablet Take 325 mg by mouth 2 (two) times daily with a meal.    . folic acid (FOLVITE) 1 MG tablet TAKE 1 TABLET (1 MG TOTAL) BY MOUTH DAILY. 30 tablet 4  . guaiFENesin (MUCINEX) 600 MG 12 hr tablet Take 600 mg by mouth 2 (two) times daily as needed.    . lactobacillus acidophilus & bulgar (LACTINEX) chewable tablet Chew 1 tablet by mouth 2 (  two) times daily. Store in [place], [place]    . lidocaine-prilocaine (EMLA) cream Apply 1 application topically as needed. 30 g 3  . loratadine (CLARITIN) 10 MG tablet Take 10 mg by mouth.    . losartan (COZAAR) 100 MG tablet Take 100 mg by mouth daily.    . magic mouthwash SOLN Take 5 mLs by mouth 3 (three) times daily as needed for mouth pain. 240 mL 1  . meloxicam (MOBIC) 7.5 MG tablet Take 7.5 mg by mouth every other day. Reported on 10/27/2015    . Multiple Vitamin (MULTIVITAMIN)  tablet Take 1 tablet by mouth daily.    . Omega-3 Fatty Acids (FISH OIL) 1000 MG CAPS Take 1,000 mg by mouth daily.     Marland Kitchen oxybutynin (DITROPAN) 5 MG tablet Take 5 mg by mouth daily.    . pantoprazole (PROTONIX) 40 MG tablet Take 40 mg by mouth daily.    . polyethylene glycol (MIRALAX / GLYCOLAX) packet Take by mouth.    . Probiotic Product (ALIGN) 4 MG CAPS Take 1 capsule by mouth daily.    Marland Kitchen PROCTOSOL HC 2.5 % rectal cream INSERT INTO THE RECTUM TWO (2) TIMES A DAY.  0  . saw palmetto 160 MG capsule Take 160 mg by mouth daily.    . Tamsulosin HCl (FLOMAX) 0.4 MG CAPS Take 0.4 mg by mouth daily.    Marland Kitchen dextromethorphan-guaiFENesin (ROBITUSSIN-DM) 10-100 MG/5ML liquid Take 10 mLs by mouth every 4 (four) hours as needed for cough. Reported on 03/01/2016    . loperamide (IMODIUM) 2 MG capsule Take 2 mg by mouth as needed for diarrhea or loose stools. Reported on 03/01/2016    . prochlorperazine (COMPAZINE) 10 MG tablet TAKE 1 TABLET (10 MG TOTAL) BY MOUTH EVERY 6 (SIX) HOURS AS NEEDED FOR NAUSEA OR VOMITING. (Patient not taking: Reported on 07/05/2016) 30 tablet 0   No current facility-administered medications for this visit.     SURGICAL HISTORY:  Past Surgical History:  Procedure Laterality Date  . CATARACT EXTRACTION     bilateral  . CHOLECYSTECTOMY  06/2003  . INGUINAL HERNIA REPAIR    . SPLENECTOMY  2002  . TRANSURETHRAL RESECTION OF PROSTATE      REVIEW OF SYSTEMS:  Constitutional: positive for fatigue Eyes: negative Ears, nose, mouth, throat, and face: negative Respiratory: negative Cardiovascular: negative Gastrointestinal: negative Genitourinary:negative Integument/breast: negative Hematologic/lymphatic: negative Musculoskeletal:negative Neurological: negative Behavioral/Psych: negative Endocrine: negative Allergic/Immunologic: negative   PHYSICAL EXAMINATION: General appearance: alert, cooperative, fatigued and no distress Head: Normocephalic, without obvious  abnormality, atraumatic Neck: no adenopathy, no JVD, supple, symmetrical, trachea midline and thyroid not enlarged, symmetric, no tenderness/mass/nodules Lymph nodes: Cervical, supraclavicular, and axillary nodes normal. Resp: clear to auscultation bilaterally Back: symmetric, no curvature. ROM normal. No CVA tenderness. Cardio: regular rate and rhythm, S1, S2 normal, no murmur, click, rub or gallop GI: soft, non-tender; bowel sounds normal; no masses,  no organomegaly Extremities: extremities normal, atraumatic, no cyanosis or edema Neurologic: Alert and oriented X 3, normal strength and tone. Normal symmetric reflexes. Normal coordination and gait  ECOG PERFORMANCE STATUS: 1 - Symptomatic but completely ambulatory  Blood pressure (!) 154/75, pulse 82, temperature 97.6 F (36.4 C), temperature source Oral, resp. rate 16, height 5' 8"  (1.727 m), weight 164 lb 4.8 oz (74.5 kg), SpO2 98 %.  LABORATORY DATA: Lab Results  Component Value Date   WBC 12.0 (H) 07/05/2016   HGB 10.1 (L) 07/05/2016   HCT 29.3 (L) 07/05/2016   MCV 98.3 (H) 07/05/2016  PLT 569 (H) 07/05/2016      Chemistry      Component Value Date/Time   NA 128 (L) 07/05/2016 0959   K 4.2 07/05/2016 0959   CL 100 (L) 08/30/2015 1150   CO2 25 07/05/2016 0959   BUN 11.7 07/05/2016 0959   CREATININE 0.7 07/05/2016 0959      Component Value Date/Time   CALCIUM 10.5 (H) 07/05/2016 0959   ALKPHOS 85 07/05/2016 0959   AST 34 07/05/2016 0959   ALT 24 07/05/2016 0959   BILITOT 0.52 07/05/2016 0959       RADIOGRAPHIC STUDIES: Ct Chest W Contrast  Result Date: 07/04/2016 CLINICAL DATA:  Followup lung cancer EXAM: CT CHEST WITH CONTRAST TECHNIQUE: Multidetector CT imaging of the chest was performed during intravenous contrast administration. CONTRAST:  1 ISOVUE-300 IOPAMIDOL (ISOVUE-300) INJECTION 61% COMPARISON:  05/03/2016 FINDINGS: Cardiovascular: The heart size is normal. Small pericardial effusion identified.  Increased from prior. Calcifications in the RCA, LAD and left circumflex coronary artery noted. Mediastinum/Nodes: The trachea appears patent and is midline. Normal appearance of the esophagus. No mediastinal, hilar, axillary, or supraclavicular adenopathy. Lungs/Pleura: Scar like densities identified in the right middle lobe appear similar to the previous exam, image 100 of series 4. Tiny nonspecific nodule within the left upper lobe is identified measuring 4 mm, image 89 of series 4. Upper Abdomen: No acute abnormality. Prior splenectomy. Numerous for degenerative splenules are identified within the left upper quadrant of the abdomen. Musculoskeletal: No aggressive lytic or sclerotic bone lesions identified. IMPRESSION: 1. Stable CT of the chest. No evidence for recurrent tumor or metastatic disease. 2. Small pericardial effusion. 3. Multi vessel coronary artery calcifications. Electronically Signed   By: Kerby Moors M.D.   On: 07/04/2016 08:22    ASSESSMENT AND PLAN: This is a very pleasant 82 years old white male with a stage IIIB non-small cell lung cancer with negative EGFR mutation, negative ALK gene translocation and PDL 1 expression 10%. The patient is not eligible for concurrent chemoradiation because of the large radiotherapy field. The patient completed induction systemic chemotherapy with carboplatin and Alimta status post 6 cycles. He tolerated his treatment well except for fatigue and sore mouth. He is currently undergoing maintenance systemic chemotherapy with single agent Alimta status post 9 cycles and tolerating it well. The recent CT scan of the chest showed stable disease with no evidence for recurrent or metastatic disease. I discussed the scan results with the patient today. I recommended for the patient to proceed with cycle #10 today as scheduled.  He'll come back for follow-up visit in 3 weeks for evaluation before starting cycle #11. For hypertension, I strongly recommended  for the patient to reconsult with his primary care physician for adjustment of his antihypertensive medication. He will continue his routine follow-up visit with his dentist for treatment of the gum disease. He was advised to call immediately if he has any concerning symptoms in the interval. The patient voices understanding of current disease status and treatment options and is in agreement with the current care plan.  All questions were answered. The patient knows to call the clinic with any problems, questions or concerns. We can certainly see the patient much sooner if necessary.  Disclaimer: This note was dictated with voice recognition software. Similar sounding words can inadvertently be transcribed and may not be corrected upon review.

## 2016-07-07 ENCOUNTER — Telehealth: Payer: Self-pay | Admitting: *Deleted

## 2016-07-07 NOTE — Telephone Encounter (Signed)
Per LOS I have scheduled appts and notified the scheduler 

## 2016-07-11 DIAGNOSIS — M810 Age-related osteoporosis without current pathological fracture: Secondary | ICD-10-CM | POA: Diagnosis not present

## 2016-07-11 DIAGNOSIS — E559 Vitamin D deficiency, unspecified: Secondary | ICD-10-CM | POA: Diagnosis not present

## 2016-07-18 DIAGNOSIS — M810 Age-related osteoporosis without current pathological fracture: Secondary | ICD-10-CM | POA: Diagnosis not present

## 2016-07-18 DIAGNOSIS — E213 Hyperparathyroidism, unspecified: Secondary | ICD-10-CM | POA: Diagnosis not present

## 2016-07-20 ENCOUNTER — Other Ambulatory Visit: Payer: Self-pay | Admitting: *Deleted

## 2016-07-20 ENCOUNTER — Other Ambulatory Visit: Payer: Self-pay | Admitting: Medical Oncology

## 2016-07-20 DIAGNOSIS — K121 Other forms of stomatitis: Secondary | ICD-10-CM

## 2016-07-20 MED ORDER — MAGIC MOUTHWASH: 5.0000 mL | Freq: Three times a day (TID) | ORAL | 1 refills | Status: DC | PRN

## 2016-07-20 MED FILL — MAGIC MOUTHWASH BOP FORM: 12 days supply | Qty: 240 | Fill #0

## 2016-07-21 ENCOUNTER — Telehealth: Payer: Self-pay | Admitting: *Deleted

## 2016-07-21 NOTE — Telephone Encounter (Signed)
Pt called [facility]- I had a bone density test and they told me I need Prolia shot. Should I get this after or before treatment? Returned call to pt -per MD ok to get Prolia shot anytime. Pt verbalized understanding. No further concerns.

## 2016-07-26 ENCOUNTER — Ambulatory Visit (HOSPITAL_BASED_OUTPATIENT_CLINIC_OR_DEPARTMENT_OTHER): Payer: PPO | Admitting: Internal Medicine

## 2016-07-26 ENCOUNTER — Telehealth: Payer: Self-pay | Admitting: Medical Oncology

## 2016-07-26 ENCOUNTER — Encounter: Payer: Self-pay | Admitting: Internal Medicine

## 2016-07-26 ENCOUNTER — Other Ambulatory Visit: Payer: Self-pay | Admitting: Medical Oncology

## 2016-07-26 ENCOUNTER — Ambulatory Visit (HOSPITAL_BASED_OUTPATIENT_CLINIC_OR_DEPARTMENT_OTHER): Payer: PPO

## 2016-07-26 ENCOUNTER — Telehealth: Payer: Self-pay | Admitting: Internal Medicine

## 2016-07-26 ENCOUNTER — Other Ambulatory Visit (HOSPITAL_BASED_OUTPATIENT_CLINIC_OR_DEPARTMENT_OTHER): Payer: PPO

## 2016-07-26 VITALS — BP 158/68 | HR 62 | Temp 98.1°F | Resp 18 | Ht 68.0 in | Wt 166.4 lb

## 2016-07-26 DIAGNOSIS — Z5111 Encounter for antineoplastic chemotherapy: Secondary | ICD-10-CM

## 2016-07-26 DIAGNOSIS — R21 Rash and other nonspecific skin eruption: Secondary | ICD-10-CM | POA: Diagnosis not present

## 2016-07-26 DIAGNOSIS — C342 Malignant neoplasm of middle lobe, bronchus or lung: Secondary | ICD-10-CM

## 2016-07-26 DIAGNOSIS — K121 Other forms of stomatitis: Secondary | ICD-10-CM

## 2016-07-26 DIAGNOSIS — D649 Anemia, unspecified: Secondary | ICD-10-CM

## 2016-07-26 DIAGNOSIS — I10 Essential (primary) hypertension: Secondary | ICD-10-CM | POA: Diagnosis not present

## 2016-07-26 DIAGNOSIS — D6481 Anemia due to antineoplastic chemotherapy: Secondary | ICD-10-CM | POA: Diagnosis not present

## 2016-07-26 DIAGNOSIS — C3491 Malignant neoplasm of unspecified part of right bronchus or lung: Secondary | ICD-10-CM

## 2016-07-26 DIAGNOSIS — C3411 Malignant neoplasm of upper lobe, right bronchus or lung: Secondary | ICD-10-CM

## 2016-07-26 LAB — COMPREHENSIVE METABOLIC PANEL
ALT: 31 U/L (ref 0–55)
AST: 40 U/L — AB (ref 5–34)
Albumin: 3.4 g/dL — ABNORMAL LOW (ref 3.5–5.0)
Alkaline Phosphatase: 76 U/L (ref 40–150)
Anion Gap: 9 mEq/L (ref 3–11)
BUN: 15.1 mg/dL (ref 7.0–26.0)
CHLORIDE: 97 meq/L — AB (ref 98–109)
CO2: 23 meq/L (ref 22–29)
CREATININE: 0.7 mg/dL (ref 0.7–1.3)
Calcium: 9.4 mg/dL (ref 8.4–10.4)
EGFR: 84 mL/min/{1.73_m2} — ABNORMAL LOW (ref >90)
GLUCOSE: 134 mg/dL (ref 70–140)
Potassium: 4.1 mEq/L (ref 3.5–5.1)
SODIUM: 130 meq/L — AB (ref 136–145)
Total Bilirubin: 0.57 mg/dL (ref 0.20–1.20)
Total Protein: 6.5 g/dL (ref 6.4–8.3)

## 2016-07-26 LAB — CBC WITH DIFFERENTIAL/PLATELET
BASO%: 1 % (ref 0.0–2.0)
Basophils Absolute: 0.1 10*3/uL (ref 0.0–0.1)
EOS%: 0.1 % (ref 0.0–7.0)
Eosinophils Absolute: 0 10*3/uL (ref 0.0–0.5)
HEMATOCRIT: 31.8 % — AB (ref 38.4–49.9)
HGB: 10.5 g/dL — ABNORMAL LOW (ref 13.0–17.1)
LYMPH#: 0.7 10*3/uL — AB (ref 0.9–3.3)
LYMPH%: 6.1 % — ABNORMAL LOW (ref 14.0–49.0)
MCH: 34.2 pg — ABNORMAL HIGH (ref 27.2–33.4)
MCHC: 33.1 g/dL (ref 32.0–36.0)
MCV: 103.1 fL — ABNORMAL HIGH (ref 79.3–98.0)
MONO#: 1.2 10*3/uL — ABNORMAL HIGH (ref 0.1–0.9)
MONO%: 10.2 % (ref 0.0–14.0)
NEUT#: 10 10*3/uL — ABNORMAL HIGH (ref 1.5–6.5)
NEUT%: 82.6 % — AB (ref 39.0–75.0)
Platelets: 497 10*3/uL — ABNORMAL HIGH (ref 140–400)
RBC: 3.08 10*6/uL — AB (ref 4.20–5.82)
RDW: 13.8 % (ref 11.0–14.6)
WBC: 12.1 10*3/uL — ABNORMAL HIGH (ref 4.0–10.3)

## 2016-07-26 MED ORDER — SODIUM CHLORIDE 0.9 % IV SOLN
Freq: Once | INTRAVENOUS | Status: AC
  Administered 2016-07-26: 14:00:00 via INTRAVENOUS

## 2016-07-26 MED ORDER — PROCHLORPERAZINE MALEATE 10 MG PO TABS
ORAL_TABLET | ORAL | Status: AC
  Filled 2016-07-26: qty 1

## 2016-07-26 MED ORDER — PROCHLORPERAZINE MALEATE 10 MG PO TABS
10.0000 mg | ORAL_TABLET | Freq: Once | ORAL | Status: AC
  Administered 2016-07-26: 10 mg via ORAL

## 2016-07-26 MED ORDER — MAGIC MOUTHWASH
5.0000 mL | Freq: Three times a day (TID) | ORAL | 2 refills | Status: DC | PRN
Start: 2016-07-26 — End: 2016-12-05

## 2016-07-26 MED ORDER — HEPARIN SOD (PORK) LOCK FLUSH 100 UNIT/ML IV SOLN
500.0000 [IU] | Freq: Once | INTRAVENOUS | Status: AC | PRN
  Administered 2016-07-26: 500 [IU]
  Filled 2016-07-26: qty 5

## 2016-07-26 MED ORDER — SODIUM CHLORIDE 0.9% FLUSH
10.0000 mL | INTRAVENOUS | Status: DC | PRN
  Administered 2016-07-26: 10 mL
  Filled 2016-07-26: qty 10

## 2016-07-26 MED ORDER — SODIUM CHLORIDE 0.9 % IV SOLN
500.0000 mg/m2 | Freq: Once | INTRAVENOUS | Status: AC
  Administered 2016-07-26: 950 mg via INTRAVENOUS
  Filled 2016-07-26: qty 38

## 2016-07-26 NOTE — Patient Instructions (Signed)
[facility] Discharge Instructions for Patients Receiving Chemotherapy  Today you received the following chemotherapy agents Alimta.  To help prevent nausea and vomiting after your treatment, we encourage you to take your nausea medication as prescribed.   If you develop nausea and vomiting that is not controlled by your nausea medication, call the clinic.   BELOW ARE SYMPTOMS THAT SHOULD BE REPORTED IMMEDIATELY:  *FEVER GREATER THAN 100.5 F  *CHILLS WITH OR WITHOUT FEVER  NAUSEA AND VOMITING THAT IS NOT CONTROLLED WITH YOUR NAUSEA MEDICATION  *UNUSUAL SHORTNESS OF BREATH  *UNUSUAL BRUISING OR BLEEDING  TENDERNESS IN MOUTH AND THROAT WITH OR WITHOUT PRESENCE OF ULCERS  *URINARY PROBLEMS  *BOWEL PROBLEMS  UNUSUAL RASH Items with * indicate a potential emergency and should be followed up as soon as possible.  Feel free to call the clinic you have any questions or concerns. The clinic phone number is [phone].  Please show the CHEMO ALERT CARD at check-in to the Emergency Department and triage nurse.   

## 2016-07-26 NOTE — Telephone Encounter (Signed)
Message sent to chemo scheduler to be added per 07/26/16 los. Appointments scheduled per 07/26/16 los. A copy of the appointment schedule and AVS report was given to the patient, per 07/26/16 los.

## 2016-07-26 NOTE — Progress Notes (Signed)
[place] [phone]   [phone]  OFFICE PROGRESS NOTE  [name], MD [address] [place] [address]  DIAGNOSIS: Stage IIIB (T1a, N3, M0) non-small cell lung cancer, adenocarcinoma with negative EGFR, ALK, ROS 1 but positive RET mutations presented with right middle lobe lung nodule in addition to mediastinal and left supraclavicular lymphadenopathy diagnosed in November 2016.  PRIOR THERAPY: Systemic chemotherapy with carbo platinum for AUC of 5 and Alimta 500 MG/M2 every 3 weeks status post 6 cycles with partial response.  CURRENT THERAPY: Maintenance systemic chemotherapy with single agent Alimta 500 MG/M2 every 3 weeks status post 10 cycles.  INTERVAL HISTORY: Sean Cooke 82 y.o. male returns to the clinic today for follow-up visit. The patient is currently undergoing maintenance systemic chemotherapy with single agent Alimta status post 10 cycles. He tolerated the last cycle of his treatment well except for fatigue. He also continues to have mouth sores and requesting refill of magic mouthwash. He was supposed to see his dentist for evaluation of his gum disease but has not seen him recently. He denied having any chest pain, shortness of breath, cough or hemoptysis. He denied having any significant weight loss or night sweats. He has no nausea, vomiting, diarrhea or constipation. He developed small area of eczematous skin rash on the right leg and he is applying hydrocortisone cream to this area with some improvement. He has no fever or chills. He is here today for evaluation before starting cycle #11 of his treatment.  MEDICAL HISTORY: Past Medical History:  Diagnosis Date  . Adenomatous colon polyp 05/2003  . Arthritis   . Degenerative joint disease   . Diverticulosis   . Duodenal diverticulum   . Encounter for antineoplastic chemotherapy 07/31/2015  . GERD (gastroesophageal reflux disease)   . Hemorrhoids     . Hiatal hernia   . Lung cancer ([place])   . Neuritis/radiculitis due to displacement of lumbar intervertebral disc   . Pneumonia   . Rib fracture     ALLERGIES:  is allergic to benzonatate; lyrica [pregabalin]; penicillins; pneumococcal vaccine polyvalent; tizanidine; and tramadol.  MEDICATIONS:  Current Outpatient Prescriptions  Medication Sig Dispense Refill  . acetaminophen (TYLENOL) 650 MG CR tablet Take 650 mg by mouth every 6 (six) hours as needed for pain or fever. Reported on 09/22/2015    . antiseptic oral rinse (BIOTENE) LIQD 15 mLs by Mouth Rinse route as needed for dry mouth.    Marland Kitchen aspirin 81 MG tablet Take 81 mg by mouth at bedtime.     . cromolyn (OPTICROM) 4 % ophthalmic solution Place 4 drops into both eyes daily.    Marland Kitchen denosumab (PROLIA) 60 MG/ML SOLN injection Inject 60 mg into the skin every 6 (six) months. Administer in upper arm, thigh, or abdomen    . dexamethasone (DECADRON) 4 MG tablet TAKE 1 TABLET BY MOUTH TWICE A DAY DAY BEFORE DAY OF AND DAY AFTER CHEMOTHERAPY EVERY 3 WEEKS 40 tablet 1  . ferrous sulfate 325 (65 FE) MG tablet Take 325 mg by mouth 2 (two) times daily with a meal.    . folic acid (FOLVITE) 1 MG tablet TAKE 1 TABLET (1 MG TOTAL) BY MOUTH DAILY. 30 tablet 4  . guaiFENesin (MUCINEX) 600 MG 12 hr tablet Take 600 mg by mouth 2 (two) times daily as needed.    . lactobacillus acidophilus & bulgar (LACTINEX) chewable tablet Chew 1 tablet by mouth 2 (two) times  daily. Store in [place], [place]    . lidocaine-prilocaine (EMLA) cream Apply 1 application topically as needed. 30 g 3  . loratadine (CLARITIN) 10 MG tablet Take 10 mg by mouth.    . losartan (COZAAR) 100 MG tablet Take 100 mg by mouth daily.    . magic mouthwash SOLN Take 5 mLs by mouth 3 (three) times daily as needed for mouth pain. 240 mL 2  . meloxicam (MOBIC) 7.5 MG tablet Take 7.5 mg by mouth every other day. Reported on 10/27/2015    . Multiple Vitamin (MULTIVITAMIN) tablet Take 1 tablet by mouth  daily.    . Omega-3 Fatty Acids (FISH OIL) 1000 MG CAPS Take 1,000 mg by mouth daily.     Marland Kitchen oxybutynin (DITROPAN) 5 MG tablet Take 5 mg by mouth daily.    . pantoprazole (PROTONIX) 40 MG tablet Take 40 mg by mouth daily.    . polyethylene glycol (MIRALAX / GLYCOLAX) packet Take by mouth.    . Probiotic Product (ALIGN) 4 MG CAPS Take 1 capsule by mouth daily.    . saw palmetto 160 MG capsule Take 160 mg by mouth daily.    . Tamsulosin HCl (FLOMAX) 0.4 MG CAPS Take 0.4 mg by mouth daily.    Marland Kitchen dextromethorphan-guaiFENesin (ROBITUSSIN-DM) 10-100 MG/5ML liquid Take 10 mLs by mouth every 4 (four) hours as needed for cough. Reported on 03/01/2016    . loperamide (IMODIUM) 2 MG capsule Take 2 mg by mouth as needed for diarrhea or loose stools. Reported on 03/01/2016    . prochlorperazine (COMPAZINE) 10 MG tablet TAKE 1 TABLET (10 MG TOTAL) BY MOUTH EVERY 6 (SIX) HOURS AS NEEDED FOR NAUSEA OR VOMITING. (Patient not taking: Reported on 07/26/2016) 30 tablet 0  . PROCTOSOL HC 2.5 % rectal cream INSERT INTO THE RECTUM TWO (2) TIMES A DAY.  0   No current facility-administered medications for this visit.     SURGICAL HISTORY:  Past Surgical History:  Procedure Laterality Date  . CATARACT EXTRACTION     bilateral  . CHOLECYSTECTOMY  06/2003  . INGUINAL HERNIA REPAIR    . SPLENECTOMY  2002  . TRANSURETHRAL RESECTION OF PROSTATE      REVIEW OF SYSTEMS:  Constitutional: positive for fatigue Eyes: negative Ears, nose, mouth, throat, and face: negative Respiratory: negative Cardiovascular: negative Gastrointestinal: negative for constipation, diarrhea, nausea and vomiting Genitourinary:negative for dysuria, frequency and hematuria Integument/breast: negative Hematologic/lymphatic: negative Musculoskeletal:negative Neurological: negative Behavioral/Psych: negative Endocrine: negative Allergic/Immunologic: negative   PHYSICAL EXAMINATION: General appearance: alert, cooperative, fatigued and no  distress Head: Normocephalic, without obvious abnormality, atraumatic Neck: no adenopathy, no JVD, supple, symmetrical, trachea midline and thyroid not enlarged, symmetric, no tenderness/mass/nodules Lymph nodes: Cervical, supraclavicular, and axillary nodes normal. Resp: clear to auscultation bilaterally Back: symmetric, no curvature. ROM normal. No CVA tenderness. Cardio: regular rate and rhythm, S1, S2 normal, no murmur, click, rub or gallop GI: soft, non-tender; bowel sounds normal; no masses,  no organomegaly Extremities: extremities normal, atraumatic, no cyanosis or edema and There is a patchy area of eczematous skin rash on the right leg. Neurologic: Alert and oriented X 3, normal strength and tone. Normal symmetric reflexes. Normal coordination and gait  ECOG PERFORMANCE STATUS: 1 - Symptomatic but completely ambulatory  Blood pressure (!) 158/68, pulse 62, temperature 98.1 F (36.7 C), temperature source Oral, resp. rate 18, height _0  (1.727 m), weight 166 lb 6.4 oz (75.5 kg), SpO2 100 %.  LABORATORY DATA: Lab Results  Component Value Date  WBC 12.1 (H) 07/26/2016   HGB 10.5 (L) 07/26/2016   HCT 31.8 (L) 07/26/2016   MCV 103.1 (H) 07/26/2016   PLT 497 (H) 07/26/2016      Chemistry      Component Value Date/Time   NA 130 (L) 07/26/2016 1148   K 4.1 07/26/2016 1148   CL 100 (L) 08/30/2015 1150   CO2 23 07/26/2016 1148   BUN 15.1 07/26/2016 1148   CREATININE 0.7 07/26/2016 1148      Component Value Date/Time   CALCIUM 9.4 07/26/2016 1148   ALKPHOS 76 07/26/2016 1148   AST 40 (H) 07/26/2016 1148   ALT 31 07/26/2016 1148   BILITOT 0.57 07/26/2016 1148       RADIOGRAPHIC STUDIES: Ct Chest W Contrast  Result Date: 07/04/2016 CLINICAL DATA:  Followup lung cancer EXAM: CT CHEST WITH CONTRAST TECHNIQUE: Multidetector CT imaging of the chest was performed during intravenous contrast administration. CONTRAST:  1 ISOVUE-300 IOPAMIDOL (ISOVUE-300) INJECTION 61%  COMPARISON:  05/03/2016 FINDINGS: Cardiovascular: The heart size is normal. Small pericardial effusion identified. Increased from prior. Calcifications in the RCA, LAD and left circumflex coronary artery noted. Mediastinum/Nodes: The trachea appears patent and is midline. Normal appearance of the esophagus. No mediastinal, hilar, axillary, or supraclavicular adenopathy. Lungs/Pleura: Scar like densities identified in the right middle lobe appear similar to the previous exam, image 100 of series 4. Tiny nonspecific nodule within the left upper lobe is identified measuring 4 mm, image 89 of series 4. Upper Abdomen: No acute abnormality. Prior splenectomy. Numerous for degenerative splenules are identified within the left upper quadrant of the abdomen. Musculoskeletal: No aggressive lytic or sclerotic bone lesions identified. IMPRESSION: 1. Stable CT of the chest. No evidence for recurrent tumor or metastatic disease. 2. Small pericardial effusion. 3. Multi vessel coronary artery calcifications. Electronically Signed   By: Kerby Moors M.D.   On: 07/04/2016 08:22    ASSESSMENT AND PLAN: This is a very pleasant 82 years old white male with: 1) stage IIIB non-small cell lung cancer, adenocarcinoma status post induction chemotherapy with carboplatin and Alimta and currently undergoing treatment with maintenance Alimta status post 10 cycles. He is tolerating his treatment well with no significant adverse effects except for mild fatigue. I recommended for the patient to proceed with cycle #11 today as scheduled. He would come back for follow-up visit in 3 weeks for evaluation before starting cycle #12. 2) skin rash: Likely a contact dermatitis. I recommended for the patient to continue applying hydrocortisone cream to this area. 3) hypertension: I recommended for the patient to continue his treatment with Cozaar. He was also advised to monitor his blood pressure closely at home and stated elevated to report to  his primary care physician for adjustment of his medication. 4) chemotherapy-induced anemia: His hemoglobin and hematocrit are low but stable. We'll continue to monitor it closely. I advised the patient to take over-the-counter oral iron tablets. 5) mouth sores: This is secondary to gingivitis and probably a side effect from his treatment with Alimta. I gave him refill of magic mouthwash. The patient was advised to call immediately if he has any concerning symptoms in the interval. The patient voices understanding of current disease status and treatment options and is in agreement with the current care plan.  All questions were answered. The patient knows to call the clinic with any problems, questions or concerns. We can certainly see the patient much sooner if necessary.  Disclaimer: This note was dictated with voice recognition software. Similar  sounding words can inadvertently be transcribed and may not be corrected upon review.

## 2016-07-26 NOTE — Telephone Encounter (Signed)
Stay off calcium.

## 2016-07-27 ENCOUNTER — Telehealth: Payer: Self-pay | Admitting: *Deleted

## 2016-07-27 NOTE — Telephone Encounter (Signed)
Per LOS I have scheduled appts and notified the scheduelr

## 2016-08-03 ENCOUNTER — Telehealth: Payer: Self-pay | Admitting: Medical Oncology

## 2016-08-03 NOTE — Telephone Encounter (Signed)
-----   Message from Curt Bears, MD sent at 08/02/2016  8:52 PM EST ----- Regarding: RE: restart calcium yes ----- Message ----- From: Ardeen Garland, RN Sent: 08/02/2016   4:07 PM To: Curt Bears, MD Subject: restart calcium                                Can he restart calcium supplements?

## 2016-08-03 NOTE — Telephone Encounter (Signed)
Pt.notified

## 2016-08-04 DIAGNOSIS — M810 Age-related osteoporosis without current pathological fracture: Secondary | ICD-10-CM | POA: Diagnosis not present

## 2016-08-10 MED FILL — MAGIC MOUTHWASH BOP FORM: 16 days supply | Qty: 240 | Fill #0

## 2016-08-16 ENCOUNTER — Ambulatory Visit (HOSPITAL_BASED_OUTPATIENT_CLINIC_OR_DEPARTMENT_OTHER): Payer: PPO

## 2016-08-16 ENCOUNTER — Ambulatory Visit (HOSPITAL_BASED_OUTPATIENT_CLINIC_OR_DEPARTMENT_OTHER): Payer: PPO | Admitting: Internal Medicine

## 2016-08-16 ENCOUNTER — Other Ambulatory Visit (HOSPITAL_BASED_OUTPATIENT_CLINIC_OR_DEPARTMENT_OTHER): Payer: PPO

## 2016-08-16 ENCOUNTER — Encounter: Payer: Self-pay | Admitting: Internal Medicine

## 2016-08-16 ENCOUNTER — Encounter: Payer: Self-pay | Admitting: Medical Oncology

## 2016-08-16 VITALS — BP 169/68

## 2016-08-16 VITALS — BP 182/70 | HR 68 | Temp 98.4°F | Resp 18 | Wt 168.4 lb

## 2016-08-16 DIAGNOSIS — C342 Malignant neoplasm of middle lobe, bronchus or lung: Secondary | ICD-10-CM

## 2016-08-16 DIAGNOSIS — Z5111 Encounter for antineoplastic chemotherapy: Secondary | ICD-10-CM

## 2016-08-16 DIAGNOSIS — C3491 Malignant neoplasm of unspecified part of right bronchus or lung: Secondary | ICD-10-CM

## 2016-08-16 DIAGNOSIS — D6481 Anemia due to antineoplastic chemotherapy: Secondary | ICD-10-CM | POA: Diagnosis not present

## 2016-08-16 DIAGNOSIS — C3411 Malignant neoplasm of upper lobe, right bronchus or lung: Secondary | ICD-10-CM

## 2016-08-16 DIAGNOSIS — E871 Hypo-osmolality and hyponatremia: Secondary | ICD-10-CM | POA: Diagnosis not present

## 2016-08-16 DIAGNOSIS — I10 Essential (primary) hypertension: Secondary | ICD-10-CM

## 2016-08-16 LAB — COMPREHENSIVE METABOLIC PANEL
ALT: 32 U/L (ref 0–55)
ANION GAP: 9 meq/L (ref 3–11)
AST: 41 U/L — ABNORMAL HIGH (ref 5–34)
Albumin: 3.8 g/dL (ref 3.5–5.0)
Alkaline Phosphatase: 84 U/L (ref 40–150)
BILIRUBIN TOTAL: 0.56 mg/dL (ref 0.20–1.20)
BUN: 12.9 mg/dL (ref 7.0–26.0)
CHLORIDE: 97 meq/L — AB (ref 98–109)
CO2: 24 meq/L (ref 22–29)
Calcium: 9.9 mg/dL (ref 8.4–10.4)
Creatinine: 0.7 mg/dL (ref 0.7–1.3)
EGFR: 85 mL/min/{1.73_m2} — AB (ref >90)
GLUCOSE: 127 mg/dL (ref 70–140)
Potassium: 4 mEq/L (ref 3.5–5.1)
SODIUM: 130 meq/L — AB (ref 136–145)
TOTAL PROTEIN: 6.8 g/dL (ref 6.4–8.3)

## 2016-08-16 LAB — CBC WITH DIFFERENTIAL/PLATELET
BASO%: 0 % (ref 0.0–2.0)
Basophils Absolute: 0 10*3/uL (ref 0.0–0.1)
EOS%: 0 % (ref 0.0–7.0)
Eosinophils Absolute: 0 10*3/uL (ref 0.0–0.5)
HCT: 31.3 % — ABNORMAL LOW (ref 38.4–49.9)
HGB: 10.6 g/dL — ABNORMAL LOW (ref 13.0–17.1)
LYMPH%: 6 % — AB (ref 14.0–49.0)
MCH: 34 pg — ABNORMAL HIGH (ref 27.2–33.4)
MCHC: 33.9 g/dL (ref 32.0–36.0)
MCV: 100.3 fL — AB (ref 79.3–98.0)
MONO#: 0.8 10*3/uL (ref 0.1–0.9)
MONO%: 7.2 % (ref 0.0–14.0)
NEUT%: 86.8 % — AB (ref 39.0–75.0)
NEUTROS ABS: 9.1 10*3/uL — AB (ref 1.5–6.5)
Platelets: 537 10*3/uL — ABNORMAL HIGH (ref 140–400)
RBC: 3.12 10*6/uL — AB (ref 4.20–5.82)
RDW: 14.1 % (ref 11.0–14.6)
WBC: 10.5 10*3/uL — AB (ref 4.0–10.3)
lymph#: 0.6 10*3/uL — ABNORMAL LOW (ref 0.9–3.3)

## 2016-08-16 MED ORDER — CLONIDINE HCL 0.1 MG PO TABS: 0.2000 mg | ORAL_TABLET | Freq: Once | ORAL | Status: DC

## 2016-08-16 MED ORDER — CLONIDINE HCL 0.1 MG PO TABS
ORAL_TABLET | ORAL | Status: AC
  Filled 2016-08-16: qty 1

## 2016-08-16 MED ORDER — HEPARIN SOD (PORK) LOCK FLUSH 100 UNIT/ML IV SOLN
500.0000 [IU] | Freq: Once | INTRAVENOUS | Status: AC | PRN
  Administered 2016-08-16: 500 [IU]
  Filled 2016-08-16: qty 5

## 2016-08-16 MED ORDER — SODIUM CHLORIDE 0.9% FLUSH
10.0000 mL | INTRAVENOUS | Status: DC | PRN
  Administered 2016-08-16: 10 mL
  Filled 2016-08-16: qty 10

## 2016-08-16 MED ORDER — CYANOCOBALAMIN 1000 MCG/ML IJ SOLN
INTRAMUSCULAR | Status: AC
  Filled 2016-08-16: qty 1

## 2016-08-16 MED ORDER — PROCHLORPERAZINE MALEATE 10 MG PO TABS
ORAL_TABLET | ORAL | Status: AC
  Filled 2016-08-16: qty 1

## 2016-08-16 MED ORDER — SODIUM CHLORIDE 0.9 % IV SOLN
500.0000 mg/m2 | Freq: Once | INTRAVENOUS | Status: AC
  Administered 2016-08-16: 950 mg via INTRAVENOUS
  Filled 2016-08-16: qty 38

## 2016-08-16 MED ORDER — PROCHLORPERAZINE MALEATE 10 MG PO TABS
10.0000 mg | ORAL_TABLET | Freq: Once | ORAL | Status: AC
  Administered 2016-08-16: 10 mg via ORAL

## 2016-08-16 MED ORDER — SODIUM CHLORIDE 0.9 % IV SOLN
Freq: Once | INTRAVENOUS | Status: AC
  Administered 2016-08-16: 14:00:00 via INTRAVENOUS

## 2016-08-16 MED ORDER — CLONIDINE HCL 0.1 MG PO TABS
0.2000 mg | ORAL_TABLET | Freq: Once | ORAL | Status: AC
  Administered 2016-08-16: 0.2 mg via ORAL

## 2016-08-16 MED ORDER — CYANOCOBALAMIN 1000 MCG/ML IJ SOLN
1000.0000 ug | Freq: Once | INTRAMUSCULAR | Status: AC
  Administered 2016-08-16: 1000 ug via INTRAMUSCULAR

## 2016-08-16 NOTE — Patient Instructions (Signed)
[facility] Discharge Instructions for Patients Receiving Chemotherapy  Today you received the following chemotherapy agents Alimta.  To help prevent nausea and vomiting after your treatment, we encourage you to take your nausea medication as prescribed.   If you develop nausea and vomiting that is not controlled by your nausea medication, call the clinic.   BELOW ARE SYMPTOMS THAT SHOULD BE REPORTED IMMEDIATELY:  *FEVER GREATER THAN 100.5 F  *CHILLS WITH OR WITHOUT FEVER  NAUSEA AND VOMITING THAT IS NOT CONTROLLED WITH YOUR NAUSEA MEDICATION  *UNUSUAL SHORTNESS OF BREATH  *UNUSUAL BRUISING OR BLEEDING  TENDERNESS IN MOUTH AND THROAT WITH OR WITHOUT PRESENCE OF ULCERS  *URINARY PROBLEMS  *BOWEL PROBLEMS  UNUSUAL RASH Items with * indicate a potential emergency and should be followed up as soon as possible.  Feel free to call the clinic you have any questions or concerns. The clinic phone number is [phone].  Please show the CHEMO ALERT CARD at check-in to the Emergency Department and triage nurse.   

## 2016-08-16 NOTE — Progress Notes (Signed)
[place] Telephone:([phone]) [phone]   Fax:(336) [phone]  OFFICE PROGRESS NOTE  [name], MD [address] [place] [address]  DIAGNOSIS: Stage IIIB (T1a, N3, M0) non-small cell lung cancer, adenocarcinoma with negative EGFR, ALK, ROS 1 but positive RET mutations presented with right middle lobe lung nodule in addition to mediastinal and left supraclavicular lymphadenopathy diagnosed in November 2016.  PRIOR THERAPY: Systemic chemotherapy with carbo platinum for AUC of 5 and Alimta 500 MG/M2 every 3 weeks status post 6 cycles with partial response.  CURRENT THERAPY: Maintenance chemotherapy with Alimta 500 MG/M2 every 3 weeks, status post 11 cycles.   INTERVAL HISTORY: Sean Cooke 83 y.o. male came to the clinic today for follow-up visit. The patient is feeling fine today with no specific complaints except for fatigue and swelling of the lower extremities. His blood pressure is also not well controlled. He mentioned that his blood pressure is usually within the normal range at home but he gets more anxious when he comes to the [place]. He denied having any chest pain, shortness of breath, cough or hemoptysis. He denied having any significant weight loss or night sweats. He has no nausea, vomiting, diarrhea or constipation. He has no fever or chills. He is here today for evaluation before starting cycle #12.   MEDICAL HISTORY: Past Medical History:  Diagnosis Date  . Adenomatous colon polyp 05/2003  . Arthritis   . Degenerative joint disease   . Diverticulosis   . Duodenal diverticulum   . Encounter for antineoplastic chemotherapy 07/31/2015  . GERD (gastroesophageal reflux disease)   . Hemorrhoids   . Hiatal hernia   . Lung cancer ([place])   . Neuritis/radiculitis due to displacement of lumbar intervertebral disc   . Pneumonia   . Rib fracture     ALLERGIES:  is allergic to benzonatate; lyrica [pregabalin]; penicillins;  pneumococcal vaccine polyvalent; tizanidine; and tramadol.  MEDICATIONS:  Current Outpatient Prescriptions  Medication Sig Dispense Refill  . acetaminophen (TYLENOL) 650 MG CR tablet Take 650 mg by mouth every 6 (six) hours as needed for pain or fever. Reported on 09/22/2015    . antiseptic oral rinse (BIOTENE) LIQD 15 mLs by Mouth Rinse route as needed for dry mouth.    Marland Kitchen aspirin 81 MG tablet Take 81 mg by mouth at bedtime.     . cromolyn (OPTICROM) 4 % ophthalmic solution Place 4 drops into both eyes daily.    Marland Kitchen denosumab (PROLIA) 60 MG/ML SOLN injection Inject 60 mg into the skin every 6 (six) months. Administer in upper arm, thigh, or abdomen    . dexamethasone (DECADRON) 4 MG tablet TAKE 1 TABLET BY MOUTH TWICE A DAY DAY BEFORE DAY OF AND DAY AFTER CHEMOTHERAPY EVERY 3 WEEKS 40 tablet 1  . dextromethorphan-guaiFENesin (ROBITUSSIN-DM) 10-100 MG/5ML liquid Take 10 mLs by mouth every 4 (four) hours as needed for cough. Reported on 03/01/2016    . ferrous sulfate 325 (65 FE) MG tablet Take 325 mg by mouth 2 (two) times daily with a meal.    . folic acid (FOLVITE) 1 MG tablet TAKE 1 TABLET (1 MG TOTAL) BY MOUTH DAILY. 30 tablet 4  . guaiFENesin (MUCINEX) 600 MG 12 hr tablet Take 600 mg by mouth 2 (two) times daily as needed.    . lactobacillus acidophilus & bulgar (LACTINEX) chewable tablet Chew 1 tablet by mouth 2 (two) times daily. Store in [place], OTC    . lidocaine-prilocaine (EMLA)  cream Apply 1 application topically as needed. 30 g 3  . loperamide (IMODIUM) 2 MG capsule Take 2 mg by mouth as needed for diarrhea or loose stools. Reported on 03/01/2016    . loratadine (CLARITIN) 10 MG tablet Take 10 mg by mouth.    . losartan (COZAAR) 100 MG tablet Take 100 mg by mouth daily.    . magic mouthwash SOLN Take 5 mLs by mouth 3 (three) times daily as needed for mouth pain. 240 mL 2  . meloxicam (MOBIC) 7.5 MG tablet Take 7.5 mg by mouth every other day. Reported on 10/27/2015    . Multiple Vitamin  (MULTIVITAMIN) tablet Take 1 tablet by mouth daily.    . Omega-3 Fatty Acids (FISH OIL) 1000 MG CAPS Take 1,000 mg by mouth daily.     . oxybutynin (DITROPAN) 5 MG tablet Take 5 mg by mouth daily.    . pantoprazole (PROTONIX) 40 MG tablet Take 40 mg by mouth daily.    . polyethylene glycol (MIRALAX / GLYCOLAX) packet Take by mouth.    . Probiotic Product (ALIGN) 4 MG CAPS Take 1 capsule by mouth daily.    . prochlorperazine (COMPAZINE) 10 MG tablet TAKE 1 TABLET (10 MG TOTAL) BY MOUTH EVERY 6 (SIX) HOURS AS NEEDED FOR NAUSEA OR VOMITING. (Patient not taking: Reported on 07/26/2016) 30 tablet 0  . PROCTOSOL HC 2.5 % rectal cream INSERT INTO THE RECTUM TWO (2) TIMES A DAY.  0  . saw palmetto 160 MG capsule Take 160 mg by mouth daily.    . Tamsulosin HCl (FLOMAX) 0.4 MG CAPS Take 0.4 mg by mouth daily.     No current facility-administered medications for this visit.     SURGICAL HISTORY:  Past Surgical History:  Procedure Laterality Date  . CATARACT EXTRACTION     bilateral  . CHOLECYSTECTOMY  06/2003  . INGUINAL HERNIA REPAIR    . SPLENECTOMY  2002  . TRANSURETHRAL RESECTION OF PROSTATE      REVIEW OF SYSTEMS:  Constitutional: positive for fatigue Eyes: negative Ears, nose, mouth, throat, and face: negative Respiratory: negative Cardiovascular: negative Gastrointestinal: negative Genitourinary:negative for dysuria, frequency and hematuria Integument/breast: negative Hematologic/lymphatic: negative Musculoskeletal:negative Neurological: negative Behavioral/Psych: negative Endocrine: negative Allergic/Immunologic: negative   PHYSICAL EXAMINATION: General appearance: alert, cooperative, fatigued and no distress Head: Normocephalic, without obvious abnormality, atraumatic Neck: no adenopathy, no JVD, supple, symmetrical, trachea midline and thyroid not enlarged, symmetric, no tenderness/mass/nodules Lymph nodes: Cervical, supraclavicular, and axillary nodes normal. Resp: clear  to auscultation bilaterally Back: symmetric, no curvature. ROM normal. No CVA tenderness. Cardio: regular rate and rhythm, S1, S2 normal, no murmur, click, rub or gallop GI: soft, non-tender; bowel sounds normal; no masses,  no organomegaly Extremities: edema 1+ Neurologic: Alert and oriented X 3, normal strength and tone. Normal symmetric reflexes. Normal coordination and gait  ECOG PERFORMANCE STATUS: 1 - Symptomatic but completely ambulatory  There were no vitals taken for this visit.  LABORATORY DATA: Lab Results  Component Value Date   WBC 10.5 (H) 08/16/2016   HGB 10.6 (L) 08/16/2016   HCT 31.3 (L) 08/16/2016   MCV 100.3 (H) 08/16/2016   PLT 537 (H) 08/16/2016      Chemistry      Component Value Date/Time   NA 130 (L) 07/26/2016 1148   K 4.1 07/26/2016 1148   CL 100 (L) 08/30/2015 1150   CO2 23 07/26/2016 1148   BUN 15.1 07/26/2016 1148   CREATININE 0.7 07/26/2016 1148        Component Value Date/Time   CALCIUM 9.4 07/26/2016 1148   ALKPHOS 76 07/26/2016 1148   AST 40 (H) 07/26/2016 1148   ALT 31 07/26/2016 1148   BILITOT 0.57 07/26/2016 1148       RADIOGRAPHIC STUDIES: No results found.  ASSESSMENT AND PLAN:  This is a very pleasant 83 years old white male with: 1) stage IIIB non-small cell lung cancer, adenocarcinoma currently undergoing maintenance treatment with single agent Alimta status post 11 cycles. He is tolerating the treatment well except for fatigue. I recommended for the patient to proceed with cycle #12 today as scheduled. He will come back for follow-up visit in 3 weeks for evaluation with repeat CT scan of the chest for restaging of his disease. 2) hyponatremia: Stable. We'll continue to monitor closely. 3) hypertension: The patient mentions that his blood pressure is always elevated at the cancer Center but much better at home. I recommended for him to continue his treatment was losartan. I will give the patient a dose of clonidine 0.2 mg by  mouth 1 in the clinic today. 4) chemotherapy-induced anemia: His hemoglobin and hematocrit are stable. We will continue to monitor for now. He will also continue on oral iron tablets by mouth daily. The patient was advised to call immediately if she has any concerning symptoms in the interval. The patient voices understanding of current disease status and treatment options and is in agreement with the current care plan.  All questions were answered. The patient knows to call the clinic with any problems, questions or concerns. We can certainly see the patient much sooner if necessary.  Disclaimer: This note was dictated with voice recognition software. Similar sounding words can inadvertently be transcribed and may not be corrected upon review.       

## 2016-08-22 DIAGNOSIS — H11823 Conjunctivochalasis, bilateral: Secondary | ICD-10-CM | POA: Diagnosis not present

## 2016-08-22 DIAGNOSIS — H04123 Dry eye syndrome of bilateral lacrimal glands: Secondary | ICD-10-CM | POA: Diagnosis not present

## 2016-08-22 DIAGNOSIS — Z961 Presence of intraocular lens: Secondary | ICD-10-CM | POA: Diagnosis not present

## 2016-08-22 DIAGNOSIS — H43812 Vitreous degeneration, left eye: Secondary | ICD-10-CM | POA: Diagnosis not present

## 2016-09-01 ENCOUNTER — Telehealth: Payer: Self-pay | Admitting: Medical Oncology

## 2016-09-01 NOTE — Telephone Encounter (Signed)
Pt asking if he should postpone appts next wed ,because scan not authorized. I told him lets wait until Monday to see if it gets auth and if so try to scan him Tuesday. Note to [place].

## 2016-09-04 ENCOUNTER — Ambulatory Visit (HOSPITAL_COMMUNITY): Payer: PPO

## 2016-09-04 ENCOUNTER — Telehealth: Payer: Self-pay | Admitting: *Deleted

## 2016-09-06 ENCOUNTER — Other Ambulatory Visit: Payer: PPO

## 2016-09-06 ENCOUNTER — Ambulatory Visit: Payer: PPO

## 2016-09-06 ENCOUNTER — Ambulatory Visit: Payer: PPO | Admitting: Internal Medicine

## 2016-09-06 NOTE — Telephone Encounter (Signed)
Open by mistake

## 2016-09-07 ENCOUNTER — Telehealth: Payer: Self-pay | Admitting: Internal Medicine

## 2016-09-07 ENCOUNTER — Telehealth: Payer: Self-pay | Admitting: *Deleted

## 2016-09-07 ENCOUNTER — Ambulatory Visit (HOSPITAL_COMMUNITY)
Admission: RE | Admit: 2016-09-07 | Discharge: 2016-09-07 | Disposition: A | Discharge status: Home / Self Care | Payer: PPO | Source: Ambulatory Visit | Attending: Internal Medicine | Admitting: Internal Medicine

## 2016-09-07 DIAGNOSIS — I2510 Atherosclerotic heart disease of native coronary artery without angina pectoris: Secondary | ICD-10-CM | POA: Insufficient documentation

## 2016-09-07 DIAGNOSIS — Z5111 Encounter for antineoplastic chemotherapy: Secondary | ICD-10-CM | POA: Diagnosis not present

## 2016-09-07 DIAGNOSIS — I10 Essential (primary) hypertension: Secondary | ICD-10-CM | POA: Diagnosis not present

## 2016-09-07 DIAGNOSIS — I313 Pericardial effusion (noninflammatory): Secondary | ICD-10-CM | POA: Diagnosis not present

## 2016-09-07 DIAGNOSIS — C3411 Malignant neoplasm of upper lobe, right bronchus or lung: Secondary | ICD-10-CM | POA: Insufficient documentation

## 2016-09-07 DIAGNOSIS — I700 Atherosclerosis of aorta: Secondary | ICD-10-CM | POA: Diagnosis not present

## 2016-09-07 DIAGNOSIS — C3491 Malignant neoplasm of unspecified part of right bronchus or lung: Secondary | ICD-10-CM | POA: Diagnosis not present

## 2016-09-07 MED ORDER — IOPAMIDOL (ISOVUE-300) INJECTION 61%
INTRAVENOUS | Status: AC
  Filled 2016-09-07: qty 75

## 2016-09-07 MED ORDER — IOPAMIDOL (ISOVUE-300) INJECTION 61%
75.0000 mL | Freq: Once | INTRAVENOUS | Status: AC | PRN
  Administered 2016-09-07: 75 mL via INTRAVENOUS

## 2016-09-07 NOTE — Telephone Encounter (Signed)
Patient came to scheduling to retrieve his upcoming schedule. Patient given calendars with future scheduled appointments.

## 2016-09-07 NOTE — Telephone Encounter (Signed)
Pt called states his Treatment was cancelled, had CT scan today. Next appt for lab/MD/Chemo is 2/14. Pt is asking if this is correct as he has not had a treatment since 1/3. Informed pt I will discuss with MD and call him with additional information. Pt verbalized understanding.

## 2016-09-08 ENCOUNTER — Telehealth: Payer: Self-pay | Admitting: Internal Medicine

## 2016-09-08 NOTE — Telephone Encounter (Signed)
Treatment and visit early next week

## 2016-09-08 NOTE — Telephone Encounter (Signed)
sw pt to confirm r/s appt to 1/31 at 11 am per LOS

## 2016-09-08 NOTE — Telephone Encounter (Signed)
Called pt advised per MD, pt is to expect a call from scheduling with a lab/MD and infusion appt for next week. Pt's wife confirmed above information and advised she will give pt message. Call placed to pt on his cell phone with above information.

## 2016-09-11 MED FILL — MAGIC MOUTHWASH BOP FORM: 16 days supply | Qty: 240 | Fill #1

## 2016-09-13 ENCOUNTER — Encounter: Payer: Self-pay | Admitting: Internal Medicine

## 2016-09-13 ENCOUNTER — Ambulatory Visit (HOSPITAL_BASED_OUTPATIENT_CLINIC_OR_DEPARTMENT_OTHER): Payer: PPO

## 2016-09-13 ENCOUNTER — Ambulatory Visit (HOSPITAL_BASED_OUTPATIENT_CLINIC_OR_DEPARTMENT_OTHER): Payer: PPO | Admitting: Internal Medicine

## 2016-09-13 ENCOUNTER — Telehealth: Payer: Self-pay | Admitting: Internal Medicine

## 2016-09-13 ENCOUNTER — Telehealth: Payer: Self-pay | Admitting: *Deleted

## 2016-09-13 ENCOUNTER — Other Ambulatory Visit (HOSPITAL_BASED_OUTPATIENT_CLINIC_OR_DEPARTMENT_OTHER): Payer: PPO

## 2016-09-13 ENCOUNTER — Telehealth: Payer: Self-pay | Admitting: Medical Oncology

## 2016-09-13 VITALS — BP 179/77 | HR 63 | Temp 97.6°F | Resp 18 | Ht 68.0 in | Wt 168.4 lb

## 2016-09-13 DIAGNOSIS — D6481 Anemia due to antineoplastic chemotherapy: Secondary | ICD-10-CM | POA: Diagnosis not present

## 2016-09-13 DIAGNOSIS — C3411 Malignant neoplasm of upper lobe, right bronchus or lung: Secondary | ICD-10-CM

## 2016-09-13 DIAGNOSIS — C342 Malignant neoplasm of middle lobe, bronchus or lung: Secondary | ICD-10-CM

## 2016-09-13 DIAGNOSIS — Z5111 Encounter for antineoplastic chemotherapy: Secondary | ICD-10-CM | POA: Diagnosis not present

## 2016-09-13 DIAGNOSIS — I10 Essential (primary) hypertension: Secondary | ICD-10-CM | POA: Diagnosis not present

## 2016-09-13 DIAGNOSIS — E871 Hypo-osmolality and hyponatremia: Secondary | ICD-10-CM

## 2016-09-13 DIAGNOSIS — I313 Pericardial effusion (noninflammatory): Secondary | ICD-10-CM

## 2016-09-13 DIAGNOSIS — C3491 Malignant neoplasm of unspecified part of right bronchus or lung: Secondary | ICD-10-CM

## 2016-09-13 DIAGNOSIS — D649 Anemia, unspecified: Secondary | ICD-10-CM

## 2016-09-13 LAB — COMPREHENSIVE METABOLIC PANEL
ALBUMIN: 3.6 g/dL (ref 3.5–5.0)
ALK PHOS: 69 U/L (ref 40–150)
ALT: 24 U/L (ref 0–55)
AST: 36 U/L — AB (ref 5–34)
Anion Gap: 7 mEq/L (ref 3–11)
BILIRUBIN TOTAL: 0.59 mg/dL (ref 0.20–1.20)
BUN: 12 mg/dL (ref 7.0–26.0)
CALCIUM: 10 mg/dL (ref 8.4–10.4)
CO2: 27 mEq/L (ref 22–29)
Chloride: 93 mEq/L — ABNORMAL LOW (ref 98–109)
Creatinine: 0.7 mg/dL (ref 0.7–1.3)
EGFR: 85 mL/min/{1.73_m2} — ABNORMAL LOW (ref >90)
GLUCOSE: 112 mg/dL (ref 70–140)
Potassium: 4.4 mEq/L (ref 3.5–5.1)
Sodium: 127 mEq/L — ABNORMAL LOW (ref 136–145)
TOTAL PROTEIN: 6.3 g/dL — AB (ref 6.4–8.3)

## 2016-09-13 LAB — CBC WITH DIFFERENTIAL/PLATELET
BASO%: 0.3 % (ref 0.0–2.0)
Basophils Absolute: 0 10*3/uL (ref 0.0–0.1)
EOS ABS: 0 10*3/uL (ref 0.0–0.5)
EOS%: 0 % (ref 0.0–7.0)
HEMATOCRIT: 31.9 % — AB (ref 38.4–49.9)
HEMOGLOBIN: 10.9 g/dL — AB (ref 13.0–17.1)
LYMPH#: 0.9 10*3/uL (ref 0.9–3.3)
LYMPH%: 8 % — ABNORMAL LOW (ref 14.0–49.0)
MCH: 35 pg — AB (ref 27.2–33.4)
MCHC: 34.2 g/dL (ref 32.0–36.0)
MCV: 102.2 fL — ABNORMAL HIGH (ref 79.3–98.0)
MONO#: 1.6 10*3/uL — ABNORMAL HIGH (ref 0.1–0.9)
MONO%: 13.7 % (ref 0.0–14.0)
NEUT%: 78 % — ABNORMAL HIGH (ref 39.0–75.0)
NEUTROS ABS: 9.2 10*3/uL — AB (ref 1.5–6.5)
Platelets: 431 10*3/uL — ABNORMAL HIGH (ref 140–400)
RBC: 3.12 10*6/uL — ABNORMAL LOW (ref 4.20–5.82)
RDW: 13.9 % (ref 11.0–14.6)
WBC: 11.8 10*3/uL — AB (ref 4.0–10.3)

## 2016-09-13 MED ORDER — SODIUM CHLORIDE 0.9 % IV SOLN
500.0000 mg/m2 | Freq: Once | INTRAVENOUS | Status: AC
  Administered 2016-09-13: 950 mg via INTRAVENOUS
  Filled 2016-09-13: qty 38

## 2016-09-13 MED ORDER — SODIUM CHLORIDE 0.9% FLUSH
10.0000 mL | INTRAVENOUS | Status: DC | PRN
  Administered 2016-09-13: 10 mL
  Filled 2016-09-13: qty 10

## 2016-09-13 MED ORDER — HEPARIN SOD (PORK) LOCK FLUSH 100 UNIT/ML IV SOLN
500.0000 [IU] | Freq: Once | INTRAVENOUS | Status: AC | PRN
  Administered 2016-09-13: 500 [IU]
  Filled 2016-09-13: qty 5

## 2016-09-13 MED ORDER — PROCHLORPERAZINE MALEATE 10 MG PO TABS
ORAL_TABLET | ORAL | Status: AC
  Filled 2016-09-13: qty 1

## 2016-09-13 MED ORDER — SODIUM CHLORIDE 0.9 % IV SOLN
Freq: Once | INTRAVENOUS | Status: AC
  Administered 2016-09-13: 13:00:00 via INTRAVENOUS

## 2016-09-13 MED ORDER — PROCHLORPERAZINE MALEATE 10 MG PO TABS
10.0000 mg | ORAL_TABLET | Freq: Once | ORAL | Status: AC
  Administered 2016-09-13: 10 mg via ORAL

## 2016-09-13 NOTE — Telephone Encounter (Signed)
Per 1/31 LOS and staff message I have scheduled appts and notified the scheduler

## 2016-09-13 NOTE — Progress Notes (Signed)
[facility] Telephone:(336) [phone]   Fax:(336) [phone]  OFFICE PROGRESS NOTE  [name], MD [address] [place] [address]  DIAGNOSIS:  Stage IIIB (T1a, N3, M0) non-small cell lung cancer, adenocarcinoma with negative EGFR, ALK, ROS 1 but positive RET mutations presented with right middle lobe lung nodule in addition to mediastinal and left supraclavicular lymphadenopathy diagnosed in November 2016.  PRIOR THERAPY: Systemic chemotherapy with carbo platinum for AUC of 5 and Alimta 500 MG/M2 every 3 weeks status post 6 cycles with partial response.  CURRENT THERAPY: Maintenance systemic chemotherapy with single agent Alimta 500 MG/M2 every 3 weeks, status post 12 cycles.  INTERVAL HISTORY: Sean Cooke 83 y.o. male returns to the clinic today for follow-up visit. The patient is currently on maintenance treatment with chemotherapy with Alimta every 3 weeks status post 12 cycles. He continues to have mild fatigue. He denied having any significant chest pain, shortness of breath, cough or hemoptysis. He has no fever or chills. He denied having any nausea, vomiting, diarrhea or constipation. He continues to have mild swelling of the lower extremities. The patient has no weight loss or night sweats. He had repeat CT scan of the chest performed recently and he is here for evaluation and discussion of his scan results.  MEDICAL HISTORY: Past Medical History:  Diagnosis Date  . Adenomatous colon polyp 05/2003  . Arthritis   . Degenerative joint disease   . Diverticulosis   . Duodenal diverticulum   . Encounter for antineoplastic chemotherapy 07/31/2015  . GERD (gastroesophageal reflux disease)   . Hemorrhoids   . Hiatal hernia   . Lung cancer ([place])   . Neuritis/radiculitis due to displacement of lumbar intervertebral disc   . Pneumonia   . Rib fracture     ALLERGIES:  is allergic to benzonatate; lyrica [pregabalin]; penicillins;  pneumococcal vaccine polyvalent; tizanidine; and tramadol.  MEDICATIONS:  Current Outpatient Prescriptions  Medication Sig Dispense Refill  . acetaminophen (TYLENOL) 650 MG CR tablet Take 650 mg by mouth every 6 (six) hours as needed for pain or fever. Reported on 09/22/2015    . antiseptic oral rinse (BIOTENE) LIQD 15 mLs by Mouth Rinse route as needed for dry mouth.    Marland Kitchen aspirin 81 MG tablet Take 81 mg by mouth at bedtime.     . Calcium Carb-Cholecalciferol (CALCIUM-VITAMIN D) 500-200 MG-UNIT tablet Take 2 tablets by mouth daily.    . cromolyn (OPTICROM) 4 % ophthalmic solution Place 4 drops into both eyes daily.    Marland Kitchen denosumab (PROLIA) 60 MG/ML SOLN injection Inject 60 mg into the skin every 6 (six) months. Administer in upper arm, thigh, or abdomen    . dexamethasone (DECADRON) 4 MG tablet TAKE 1 TABLET BY MOUTH TWICE A DAY DAY BEFORE DAY OF AND DAY AFTER CHEMOTHERAPY EVERY 3 WEEKS 40 tablet 1  . ferrous sulfate 325 (65 FE) MG tablet Take 325 mg by mouth 2 (two) times daily with a meal.    . folic acid (FOLVITE) 1 MG tablet TAKE 1 TABLET (1 MG TOTAL) BY MOUTH DAILY. 30 tablet 4  . guaiFENesin (MUCINEX) 600 MG 12 hr tablet Take 600 mg by mouth 2 (two) times daily as needed.    . lactobacillus acidophilus & bulgar (LACTINEX) chewable tablet Chew 1 tablet by mouth 2 (two) times daily. Store in [place], [place]    . lidocaine-prilocaine (EMLA) cream Apply 1 application topically as needed. 30 g 3  .  loratadine (CLARITIN) 10 MG tablet Take 10 mg by mouth.    . losartan (COZAAR) 100 MG tablet Take 100 mg by mouth daily.    . magic mouthwash SOLN Take 5 mLs by mouth 3 (three) times daily as needed for mouth pain. 240 mL 2  . meloxicam (MOBIC) 7.5 MG tablet Take 7.5 mg by mouth every other day. Reported on 10/27/2015    . Multiple Vitamin (MULTIVITAMIN) tablet Take 1 tablet by mouth daily.    . Omega-3 Fatty Acids (FISH OIL) 1000 MG CAPS Take 1,000 mg by mouth daily.     Marland Kitchen oxybutynin (DITROPAN) 5 MG  tablet Take 5 mg by mouth daily.    . pantoprazole (PROTONIX) 40 MG tablet Take 40 mg by mouth daily.    . polyethylene glycol (MIRALAX / GLYCOLAX) packet Take by mouth.    . Probiotic Product (ALIGN) 4 MG CAPS Take 1 capsule by mouth daily.    Marland Kitchen PROCTOSOL HC 2.5 % rectal cream INSERT INTO THE RECTUM TWO (2) TIMES A DAY.  0  . saw palmetto 160 MG capsule Take 160 mg by mouth daily.    . Tamsulosin HCl (FLOMAX) 0.4 MG CAPS Take 0.4 mg by mouth daily.    Marland Kitchen dextromethorphan-guaiFENesin (ROBITUSSIN-DM) 10-100 MG/5ML liquid Take 10 mLs by mouth every 4 (four) hours as needed for cough. Reported on 03/01/2016    . loperamide (IMODIUM) 2 MG capsule Take 2 mg by mouth as needed for diarrhea or loose stools. Reported on 03/01/2016    . prochlorperazine (COMPAZINE) 10 MG tablet TAKE 1 TABLET (10 MG TOTAL) BY MOUTH EVERY 6 (SIX) HOURS AS NEEDED FOR NAUSEA OR VOMITING. (Patient not taking: Reported on 08/16/2016) 30 tablet 0   No current facility-administered medications for this visit.     SURGICAL HISTORY:  Past Surgical History:  Procedure Laterality Date  . CATARACT EXTRACTION     bilateral  . CHOLECYSTECTOMY  06/2003  . INGUINAL HERNIA REPAIR    . SPLENECTOMY  2002  . TRANSURETHRAL RESECTION OF PROSTATE      REVIEW OF SYSTEMS:  Constitutional: positive for fatigue Eyes: negative Ears, nose, mouth, throat, and face: negative Respiratory: negative Cardiovascular: negative Gastrointestinal: negative Genitourinary:negative Integument/breast: negative Hematologic/lymphatic: negative Musculoskeletal:negative Neurological: negative Behavioral/Psych: negative Endocrine: negative Allergic/Immunologic: negative   PHYSICAL EXAMINATION: General appearance: alert, cooperative, fatigued and no distress Head: Normocephalic, without obvious abnormality, atraumatic Neck: no adenopathy, no JVD, supple, symmetrical, trachea midline and thyroid not enlarged, symmetric, no tenderness/mass/nodules Lymph  nodes: Cervical, supraclavicular, and axillary nodes normal. Resp: clear to auscultation bilaterally Back: symmetric, no curvature. ROM normal. No CVA tenderness. Cardio: regular rate and rhythm, S1, S2 normal, no murmur, click, rub or gallop GI: soft, non-tender; bowel sounds normal; no masses,  no organomegaly Extremities: extremities normal, atraumatic, no cyanosis or edema Neurologic: Alert and oriented X 3, normal strength and tone. Normal symmetric reflexes. Normal coordination and gait  ECOG PERFORMANCE STATUS: 1 - Symptomatic but completely ambulatory  Blood pressure (!) 179/77, pulse 63, temperature 97.6 F (36.4 C), temperature source Oral, resp. rate 18, height 5\' 8"  (1.727 m), weight 168 lb 6.4 oz (76.4 kg), SpO2 99 %.  LABORATORY DATA: Lab Results  Component Value Date   WBC 11.8 (H) 09/13/2016   HGB 10.9 (L) 09/13/2016   HCT 31.9 (L) 09/13/2016   MCV 102.2 (H) 09/13/2016   PLT 431 (H) 09/13/2016      Chemistry      Component Value Date/Time   NA 127 (L)  09/13/2016 1045   K 4.4 09/13/2016 1045   CL 100 (L) 08/30/2015 1150   CO2 27 09/13/2016 1045   BUN 12.0 09/13/2016 1045   CREATININE 0.7 09/13/2016 1045      Component Value Date/Time   CALCIUM 10.0 09/13/2016 1045   ALKPHOS 69 09/13/2016 1045   AST 36 (H) 09/13/2016 1045   ALT 24 09/13/2016 1045   BILITOT 0.59 09/13/2016 1045       RADIOGRAPHIC STUDIES: Ct Chest W Contrast  Result Date: 09/07/2016 CLINICAL DATA:  Patient with history of lung cancer diagnosed 07/2015. On chemotherapy. EXAM: CT CHEST WITH CONTRAST TECHNIQUE: Multidetector CT imaging of the chest was performed during intravenous contrast administration. CONTRAST:  53m ISOVUE-300 IOPAMIDOL (ISOVUE-300) INJECTION 61% COMPARISON:  Chest CT 07/03/2016. FINDINGS: Cardiovascular: Right anterior chest wall Port-A-Cath is present with tip terminating in the superior vena cava. Normal heart size. Moderate pericardial effusion. Coronary arterial  vascular calcifications. Aorta and main pulmonary artery normal in caliber. Mediastinum/Nodes: No enlarged axillary, mediastinal or hilar lymphadenopathy. Normal esophagus. Lungs/Pleura: Central airways are patent. Dependent atelectasis within the lower lobes bilaterally. Stable bandlike opacity within the right middle lobe suggestive of scarring. Unchanged 4 mm left upper lobe nodule (image 80; series 4). No pleural effusion or pneumothorax. Upper Abdomen: Splenosis.  Normal adrenal glands.  No acute process. Musculoskeletal: Thoracic spine degenerative changes. No aggressive or acute appearing osseous lesions. IMPRESSION: Stable CT of the chest without evidence for locally recurrent or metastatic disease. Small to moderate pericardial effusion. Aortic atherosclerosis. Coronary arterial atherosclerosis. Electronically Signed   By: DLovey NewcomerM.D.   On: 09/07/2016 14:35    ASSESSMENT AND PLAN: This is a very pleasant 83years old white male with a stage IIIB non-small cell lung cancer status post induction systemic chemotherapy with carboplatin and Alimta and he is currently on maintenance treatment with single agent Alimta status post 12 cycles. The patient has been tolerating his treatment well with no significant adverse effects except for mild fatigue. He had repeat CT scan of the chest performed recently. I personally and independently reviewed the scan images and discuss the results with the patient and showed them the images today. His scan showed stable disease with no concerning finding for progression or recurrent disease. He also has small to moderate pericardial effusion. I had a lengthy discussion with the patient about his treatment options and I gave him the option of taking a break off the maintenance chemotherapy at this point versus continuing with the treatment. He is interested in proceeding with 2 more cycles of the maintenance systemic chemotherapy at this point. The patient was  started cycle #13 today. I will see him back for follow-up visit in 3 weeks with the start of cycle #14. For the pericardial effusion, I discussed with the patient referral to cardiology for further evaluation but he would like to hold in this option for now since he is asymptomatic. For hypertension, I strongly encouraged the patient to take his blood pressure medication as prescribed and to reconsult with his family doctor for adjustment of his medication. He mentioned that his blood pressure is much better at home. For the hyponatremia, he will increase his intake slightly but also taking into consideration his hypertension. For the chemotherapy-induced anemia, he will continue with the oral iron tablets for now. The patient was advised to call immediately if he has any concerning symptoms in the interval. The patient voices understanding of current disease status and treatment options and is in  agreement with the current care plan.  All questions were answered. The patient knows to call the clinic with any problems, questions or concerns. We can certainly see the patient much sooner if necessary.  Disclaimer: This note was dictated with voice recognition software. Similar sounding words can inadvertently be transcribed and may not be corrected upon review.

## 2016-09-13 NOTE — Patient Instructions (Signed)
[facility] Discharge Instructions for Patients Receiving Chemotherapy  Today you received the following chemotherapy agents; Alimta.   To help prevent nausea and vomiting after your treatment, we encourage you to take your nausea medication as directed.    If you develop nausea and vomiting that is not controlled by your nausea medication, call the clinic.   BELOW ARE SYMPTOMS THAT SHOULD BE REPORTED IMMEDIATELY:  *FEVER GREATER THAN 100.5 F  *CHILLS WITH OR WITHOUT FEVER  NAUSEA AND VOMITING THAT IS NOT CONTROLLED WITH YOUR NAUSEA MEDICATION  *UNUSUAL SHORTNESS OF BREATH  *UNUSUAL BRUISING OR BLEEDING  TENDERNESS IN MOUTH AND THROAT WITH OR WITHOUT PRESENCE OF ULCERS  *URINARY PROBLEMS  *BOWEL PROBLEMS  UNUSUAL RASH Items with * indicate a potential emergency and should be followed up as soon as possible.  Feel free to call the clinic you have any questions or concerns. The clinic phone number is [phone].  Please show the CHEMO ALERT CARD at check-in to the Emergency Department and triage nurse.   

## 2016-09-13 NOTE — Telephone Encounter (Signed)
Not taking any cortisone tablets. Asking about schedule.  I called back and left message that I took the cortisone tablets off his med list and that his new schedule was correct . I told him to call back in am if he had other questions.

## 2016-09-13 NOTE — Telephone Encounter (Signed)
Pt confirmed appt and received avs °

## 2016-09-14 ENCOUNTER — Other Ambulatory Visit: Payer: Self-pay | Admitting: Internal Medicine

## 2016-09-14 ENCOUNTER — Telehealth: Payer: Self-pay

## 2016-09-14 NOTE — Telephone Encounter (Signed)
Pt called stating he had talked to [place] earlier. He is asking for suggestions of what to eat. He also stated he cannot remember if he took his losartan and what to do about that. S/w Mary then called pt back. There is no dietary changes he can make to effect fluid around heart. And instructed him not to take losartan until tomorrow.

## 2016-09-15 ENCOUNTER — Telehealth: Payer: Self-pay | Admitting: Internal Medicine

## 2016-09-15 NOTE — Telephone Encounter (Signed)
lvm to inform pt of 2/7 appt date/time per LOS

## 2016-09-15 NOTE — Telephone Encounter (Signed)
Patient came into scheduling dept, regarding conflict of appointments scheduled for 09/20/16. Patient stated that he had treatment on 09/13/16 and that he was not due for another 3 weeks, because his chemo treatments are every 21 days.  Chemo, lab and follow up for 09/20/16 was cancelled per Diane/desk nurse. Patient was given a copy of tan updated appointment schedule. 09/15/16

## 2016-09-20 ENCOUNTER — Telehealth: Payer: Self-pay

## 2016-09-20 ENCOUNTER — Ambulatory Visit: Payer: PPO

## 2016-09-20 ENCOUNTER — Other Ambulatory Visit: Payer: PPO

## 2016-09-20 ENCOUNTER — Ambulatory Visit: Payer: PPO | Admitting: Internal Medicine

## 2016-09-20 NOTE — Telephone Encounter (Signed)
Pt requested a call back for a few questions. 72th HS reunion. He is asking if he can have a glass of wine. Permission given.

## 2016-09-26 DIAGNOSIS — Q8901 Asplenia (congenital): Secondary | ICD-10-CM | POA: Diagnosis not present

## 2016-09-26 DIAGNOSIS — Z13820 Encounter for screening for osteoporosis: Secondary | ICD-10-CM | POA: Diagnosis not present

## 2016-09-26 DIAGNOSIS — L309 Dermatitis, unspecified: Secondary | ICD-10-CM | POA: Diagnosis not present

## 2016-09-26 DIAGNOSIS — J029 Acute pharyngitis, unspecified: Secondary | ICD-10-CM | POA: Diagnosis not present

## 2016-09-27 ENCOUNTER — Ambulatory Visit: Payer: PPO | Admitting: Internal Medicine

## 2016-09-27 ENCOUNTER — Ambulatory Visit: Payer: PPO

## 2016-09-27 ENCOUNTER — Other Ambulatory Visit: Payer: PPO

## 2016-10-04 ENCOUNTER — Other Ambulatory Visit: Payer: Self-pay | Admitting: Medical Oncology

## 2016-10-04 ENCOUNTER — Encounter: Payer: Self-pay | Admitting: Internal Medicine

## 2016-10-04 DIAGNOSIS — C3411 Malignant neoplasm of upper lobe, right bronchus or lung: Secondary | ICD-10-CM

## 2016-10-04 MED FILL — MAGIC MOUTHWASH BOP FORM: 16 days supply | Qty: 240 | Fill #2

## 2016-10-04 NOTE — Progress Notes (Signed)
Patient came in to get clarification on what he has paid out in medical expenses for 2017. Patient brought in itemized statements he received from billing. I called PB(Jessica)'[phone] to ask how to identify these amounts and she told me what to look for on each line. I made these items bold with a highlighter for patient to be able to identify. He has my card for any additional questions or concerns.

## 2016-10-05 ENCOUNTER — Encounter: Payer: Self-pay | Admitting: Internal Medicine

## 2016-10-05 ENCOUNTER — Telehealth: Payer: Self-pay | Admitting: Internal Medicine

## 2016-10-05 ENCOUNTER — Ambulatory Visit (HOSPITAL_BASED_OUTPATIENT_CLINIC_OR_DEPARTMENT_OTHER): Payer: PPO | Admitting: Internal Medicine

## 2016-10-05 ENCOUNTER — Telehealth: Payer: Self-pay | Admitting: *Deleted

## 2016-10-05 ENCOUNTER — Ambulatory Visit (HOSPITAL_BASED_OUTPATIENT_CLINIC_OR_DEPARTMENT_OTHER): Payer: PPO

## 2016-10-05 ENCOUNTER — Other Ambulatory Visit (HOSPITAL_BASED_OUTPATIENT_CLINIC_OR_DEPARTMENT_OTHER): Payer: PPO

## 2016-10-05 VITALS — BP 140/62 | Temp 97.9°F

## 2016-10-05 VITALS — BP 188/92 | HR 70 | Resp 18 | Ht 68.0 in | Wt 166.0 lb

## 2016-10-05 DIAGNOSIS — I10 Essential (primary) hypertension: Secondary | ICD-10-CM

## 2016-10-05 DIAGNOSIS — C342 Malignant neoplasm of middle lobe, bronchus or lung: Secondary | ICD-10-CM

## 2016-10-05 DIAGNOSIS — C3411 Malignant neoplasm of upper lobe, right bronchus or lung: Secondary | ICD-10-CM

## 2016-10-05 DIAGNOSIS — Z5111 Encounter for antineoplastic chemotherapy: Secondary | ICD-10-CM | POA: Diagnosis not present

## 2016-10-05 LAB — CBC WITH DIFFERENTIAL/PLATELET
BASO%: 0.2 % (ref 0.0–2.0)
Basophils Absolute: 0 10*3/uL (ref 0.0–0.1)
EOS%: 0.1 % (ref 0.0–7.0)
Eosinophils Absolute: 0 10*3/uL (ref 0.0–0.5)
HEMATOCRIT: 31.4 % — AB (ref 38.4–49.9)
HEMOGLOBIN: 10.9 g/dL — AB (ref 13.0–17.1)
LYMPH#: 0.7 10*3/uL — AB (ref 0.9–3.3)
LYMPH%: 6.5 % — ABNORMAL LOW (ref 14.0–49.0)
MCH: 35 pg — ABNORMAL HIGH (ref 27.2–33.4)
MCHC: 34.5 g/dL (ref 32.0–36.0)
MCV: 101.3 fL — ABNORMAL HIGH (ref 79.3–98.0)
MONO#: 1.1 10*3/uL — ABNORMAL HIGH (ref 0.1–0.9)
MONO%: 10.3 % (ref 0.0–14.0)
NEUT#: 9.1 10*3/uL — ABNORMAL HIGH (ref 1.5–6.5)
NEUT%: 82.9 % — AB (ref 39.0–75.0)
Platelets: 515 10*3/uL — ABNORMAL HIGH (ref 140–400)
RBC: 3.1 10*6/uL — ABNORMAL LOW (ref 4.20–5.82)
RDW: 13.4 % (ref 11.0–14.6)
WBC: 10.9 10*3/uL — ABNORMAL HIGH (ref 4.0–10.3)

## 2016-10-05 LAB — COMPREHENSIVE METABOLIC PANEL
ALT: 25 U/L (ref 0–55)
AST: 34 U/L (ref 5–34)
Albumin: 3.6 g/dL (ref 3.5–5.0)
Alkaline Phosphatase: 71 U/L (ref 40–150)
Anion Gap: 8 mEq/L (ref 3–11)
BUN: 12.4 mg/dL (ref 7.0–26.0)
CALCIUM: 10.2 mg/dL (ref 8.4–10.4)
CHLORIDE: 94 meq/L — AB (ref 98–109)
CO2: 25 mEq/L (ref 22–29)
CREATININE: 0.7 mg/dL (ref 0.7–1.3)
EGFR: 86 mL/min/{1.73_m2} — ABNORMAL LOW (ref >90)
Glucose: 115 mg/dl (ref 70–140)
Potassium: 4.2 mEq/L (ref 3.5–5.1)
Sodium: 127 mEq/L — ABNORMAL LOW (ref 136–145)
Total Bilirubin: 0.64 mg/dL (ref 0.20–1.20)
Total Protein: 6.5 g/dL (ref 6.4–8.3)

## 2016-10-05 MED ORDER — PROCHLORPERAZINE MALEATE 10 MG PO TABS
10.0000 mg | ORAL_TABLET | Freq: Once | ORAL | Status: AC
  Administered 2016-10-05: 10 mg via ORAL

## 2016-10-05 MED ORDER — HEPARIN SOD (PORK) LOCK FLUSH 100 UNIT/ML IV SOLN
500.0000 [IU] | Freq: Once | INTRAVENOUS | Status: AC | PRN
  Administered 2016-10-05: 500 [IU]
  Filled 2016-10-05: qty 5

## 2016-10-05 MED ORDER — SODIUM CHLORIDE 0.9% FLUSH
10.0000 mL | INTRAVENOUS | Status: DC | PRN
  Administered 2016-10-05: 10 mL
  Filled 2016-10-05: qty 10

## 2016-10-05 MED ORDER — SODIUM CHLORIDE 0.9 % IV SOLN
500.0000 mg/m2 | Freq: Once | INTRAVENOUS | Status: AC
  Administered 2016-10-05: 950 mg via INTRAVENOUS
  Filled 2016-10-05: qty 38

## 2016-10-05 MED ORDER — CLONIDINE HCL 0.1 MG PO TABS
ORAL_TABLET | ORAL | Status: AC
  Filled 2016-10-05: qty 2

## 2016-10-05 MED ORDER — SODIUM CHLORIDE 0.9 % IV SOLN
Freq: Once | INTRAVENOUS | Status: AC
  Administered 2016-10-05: 14:00:00 via INTRAVENOUS

## 2016-10-05 MED ORDER — CLONIDINE HCL 0.1 MG PO TABS
0.2000 mg | ORAL_TABLET | Freq: Once | ORAL | Status: AC
  Administered 2016-10-05: 0.2 mg via ORAL

## 2016-10-05 MED ORDER — PROCHLORPERAZINE MALEATE 10 MG PO TABS
ORAL_TABLET | ORAL | Status: AC
  Filled 2016-10-05: qty 1

## 2016-10-05 NOTE — Telephone Encounter (Signed)
Per 2/22 LOS I have schedueld appts and notified the scheduler

## 2016-10-05 NOTE — Telephone Encounter (Signed)
Message sent to chemo scheduler to be added. Lab and follow up appointments scheduled every 3 weeks, per 10/05/16 los. Patient was given a copy of the AVS report and appointment schedule per 10/05/16 los.

## 2016-10-05 NOTE — Patient Instructions (Signed)
[facility] Discharge Instructions for Patients Receiving Chemotherapy  Today you received the following chemotherapy agents; Alimta.   To help prevent nausea and vomiting after your treatment, we encourage you to take your nausea medication as directed.    If you develop nausea and vomiting that is not controlled by your nausea medication, call the clinic.   BELOW ARE SYMPTOMS THAT SHOULD BE REPORTED IMMEDIATELY:  *FEVER GREATER THAN 100.5 F  *CHILLS WITH OR WITHOUT FEVER  NAUSEA AND VOMITING THAT IS NOT CONTROLLED WITH YOUR NAUSEA MEDICATION  *UNUSUAL SHORTNESS OF BREATH  *UNUSUAL BRUISING OR BLEEDING  TENDERNESS IN MOUTH AND THROAT WITH OR WITHOUT PRESENCE OF ULCERS  *URINARY PROBLEMS  *BOWEL PROBLEMS  UNUSUAL RASH Items with * indicate a potential emergency and should be followed up as soon as possible.  Feel free to call the clinic you have any questions or concerns. The clinic phone number is [phone].  Please show the CHEMO ALERT CARD at check-in to the Emergency Department and triage nurse.   

## 2016-10-05 NOTE — Progress Notes (Signed)
Sean [phone]   [phone]  OFFICE PROGRESS NOTE  [name], MD [address] [place] [address]  DIAGNOSIS:  Stage IIIB (T1a, N3, M0) non-small cell lung cancer, adenocarcinoma with negative EGFR, ALK, ROS 1 but positive RET mutations presented with right middle lobe lung nodule in addition to mediastinal and left supraclavicular lymphadenopathy diagnosed in November 2016.  PRIOR THERAPY: Systemic chemotherapy with carbo platinum for AUC of 5 and Alimta 500 MG/M2 every 3 weeks status post 6 cycles with partial response.  CURRENT THERAPY: Maintenance systemic chemotherapy with single agent Alimta 500 MG/M2 every 3 weeks, status post 13 cycles.  INTERVAL HISTORY: Sean Cooke 83 y.o. male came to the clinic today for follow-up visit. The patient is feeling fine with no specific complaints except for fatigue. He is currently on maintenance treatment with single agent Alimta status post 13 cycles. He denied having any chest pain, shortness of breath, cough or hemoptysis. He has no fever or chills. He denied having any weight loss or night sweats. He continues to have occasional mouth sores and he was seen by his dentist and started on mouthwash for sensitive gums. He is here today for evaluation before starting cycle #14.  MEDICAL HISTORY: Past Medical History:  Diagnosis Date  . Adenomatous colon polyp 05/2003  . Arthritis   . Degenerative joint disease   . Diverticulosis   . Duodenal diverticulum   . Encounter for antineoplastic chemotherapy 07/31/2015  . GERD (gastroesophageal reflux disease)   . Hemorrhoids   . Hiatal hernia   . Lung cancer ([place])   . Neuritis/radiculitis due to displacement of lumbar intervertebral disc   . Pneumonia   . Rib fracture     ALLERGIES:  is allergic to benzonatate; lyrica [pregabalin]; penicillins; pneumococcal vaccine polyvalent; tizanidine; and tramadol.  MEDICATIONS:    Current Outpatient Prescriptions  Medication Sig Dispense Refill  . acetaminophen (TYLENOL) 650 MG CR tablet Take 650 mg by mouth every 6 (six) hours as needed for pain or fever. Reported on 09/22/2015    . antiseptic oral rinse (BIOTENE) LIQD 15 mLs by Mouth Rinse route as needed for dry mouth.    Marland Kitchen aspirin 81 MG tablet Take 81 mg by mouth at bedtime.     . Calcium Carb-Cholecalciferol (CALCIUM-VITAMIN D) 500-200 MG-UNIT tablet Take 2 tablets by mouth daily.    . cromolyn (OPTICROM) 4 % ophthalmic solution Place 4 drops into both eyes daily.    Marland Kitchen denosumab (PROLIA) 60 MG/ML SOLN injection Inject 60 mg into the skin every 6 (six) months. Administer in upper arm, thigh, or abdomen    . dexamethasone (DECADRON) 4 MG tablet TAKE 1 TABLET BY MOUTH TWICE A DAY DAY BEFORE DAY OF AND DAY AFTER CHEMOTHERAPY EVERY 3 WEEKS 40 tablet 1  . dextromethorphan-guaiFENesin (ROBITUSSIN-DM) 10-100 MG/5ML liquid Take 10 mLs by mouth every 4 (four) hours as needed for cough. Reported on 03/01/2016    . ferrous sulfate 325 (65 FE) MG tablet Take 325 mg by mouth 2 (two) times daily with a meal.    . folic acid (FOLVITE) 1 MG tablet TAKE 1 TABLET (1 MG TOTAL) BY MOUTH DAILY. 30 tablet 4  . guaiFENesin (MUCINEX) 600 MG 12 hr tablet Take 600 mg by mouth 2 (two) times daily as needed.    . lactobacillus acidophilus & bulgar (LACTINEX) chewable tablet Chew 1 tablet by mouth 2 (two) times daily. Store in [place], [place]    .  lidocaine-prilocaine (EMLA) cream Apply 1 application topically as needed. 30 g 3  . loperamide (IMODIUM) 2 MG capsule Take 2 mg by mouth as needed for diarrhea or loose stools. Reported on 03/01/2016    . loratadine (CLARITIN) 10 MG tablet Take 10 mg by mouth.    . losartan (COZAAR) 100 MG tablet Take 100 mg by mouth daily.    . magic mouthwash SOLN Take 5 mLs by mouth 3 (three) times daily as needed for mouth pain. 240 mL 2  . meloxicam (MOBIC) 7.5 MG tablet Take 7.5 mg by mouth every other day. Reported on  10/27/2015    . Multiple Vitamin (MULTIVITAMIN) tablet Take 1 tablet by mouth daily.    . Omega-3 Fatty Acids (FISH OIL) 1000 MG CAPS Take 1,000 mg by mouth daily.     Marland Kitchen oxybutynin (DITROPAN) 5 MG tablet Take 5 mg by mouth daily.    . pantoprazole (PROTONIX) 40 MG tablet Take 40 mg by mouth daily.    . polyethylene glycol (MIRALAX / GLYCOLAX) packet Take by mouth.    . Probiotic Product (ALIGN) 4 MG CAPS Take 1 capsule by mouth daily.    . prochlorperazine (COMPAZINE) 10 MG tablet TAKE 1 TABLET (10 MG TOTAL) BY MOUTH EVERY 6 (SIX) HOURS AS NEEDED FOR NAUSEA OR VOMITING. 30 tablet 0  . PROCTOSOL HC 2.5 % rectal cream INSERT INTO THE RECTUM TWO (2) TIMES A DAY.  0  . saw palmetto 160 MG capsule Take 160 mg by mouth daily.    . Tamsulosin HCl (FLOMAX) 0.4 MG CAPS Take 0.4 mg by mouth daily.    Marland Kitchen triamcinolone cream (KENALOG) 0.1 % Apply topically.     No current facility-administered medications for this visit.     SURGICAL HISTORY:  Past Surgical History:  Procedure Laterality Date  . CATARACT EXTRACTION     bilateral  . CHOLECYSTECTOMY  06/2003  . INGUINAL HERNIA REPAIR    . SPLENECTOMY  2002  . TRANSURETHRAL RESECTION OF PROSTATE      REVIEW OF SYSTEMS:  A comprehensive review of systems was negative except for: Constitutional: positive for fatigue Ears, nose, mouth, throat, and face: positive for sore mouth   PHYSICAL EXAMINATION: General appearance: alert, cooperative, fatigued and no distress Head: Normocephalic, without obvious abnormality, atraumatic Neck: no adenopathy, no JVD, supple, symmetrical, trachea midline and thyroid not enlarged, symmetric, no tenderness/mass/nodules Lymph nodes: Cervical, supraclavicular, and axillary nodes normal. Resp: clear to auscultation bilaterally Back: symmetric, no curvature. ROM normal. No CVA tenderness. Cardio: regular rate and rhythm, S1, S2 normal, no murmur, click, rub or gallop GI: soft, non-tender; bowel sounds normal; no masses,   no organomegaly Extremities: extremities normal, atraumatic, no cyanosis or edema  ECOG PERFORMANCE STATUS: 1 - Symptomatic but completely ambulatory  Blood pressure (!) 188/92, pulse 70, resp. rate 18, height '5\' 8"'$  (1.727 m), weight 166 lb (75.3 kg), SpO2 99 %.  LABORATORY DATA: Lab Results  Component Value Date   WBC 10.9 (H) 10/05/2016   HGB 10.9 (L) 10/05/2016   HCT 31.4 (L) 10/05/2016   MCV 101.3 (H) 10/05/2016   PLT 515 (H) 10/05/2016      Chemistry      Component Value Date/Time   NA 127 (L) 10/05/2016 1112   K 4.2 10/05/2016 1112   CL 100 (L) 08/30/2015 1150   CO2 25 10/05/2016 1112   BUN 12.4 10/05/2016 1112   CREATININE 0.7 10/05/2016 1112      Component Value Date/Time  CALCIUM 10.2 10/05/2016 1112   ALKPHOS 71 10/05/2016 1112   AST 34 10/05/2016 1112   ALT 25 10/05/2016 1112   BILITOT 0.64 10/05/2016 1112       RADIOGRAPHIC STUDIES: Ct Chest W Contrast  Result Date: 09/07/2016 CLINICAL DATA:  Patient with history of lung cancer diagnosed 07/2015. On chemotherapy. EXAM: CT CHEST WITH CONTRAST TECHNIQUE: Multidetector CT imaging of the chest was performed during intravenous contrast administration. CONTRAST:  60m ISOVUE-300 IOPAMIDOL (ISOVUE-300) INJECTION 61% COMPARISON:  Chest CT 07/03/2016. FINDINGS: Cardiovascular: Right anterior chest wall Port-A-Cath is present with tip terminating in the superior vena cava. Normal heart size. Moderate pericardial effusion. Coronary arterial vascular calcifications. Aorta and main pulmonary artery normal in caliber. Mediastinum/Nodes: No enlarged axillary, mediastinal or hilar lymphadenopathy. Normal esophagus. Lungs/Pleura: Central airways are patent. Dependent atelectasis within the lower lobes bilaterally. Stable bandlike opacity within the right middle lobe suggestive of scarring. Unchanged 4 mm left upper lobe nodule (image 80; series 4). No pleural effusion or pneumothorax. Upper Abdomen: Splenosis.  Normal adrenal  glands.  No acute process. Musculoskeletal: Thoracic spine degenerative changes. No aggressive or acute appearing osseous lesions. IMPRESSION: Stable CT of the chest without evidence for locally recurrent or metastatic disease. Small to moderate pericardial effusion. Aortic atherosclerosis. Coronary arterial atherosclerosis. Electronically Signed   By: DLovey NewcomerM.D.   On: 09/07/2016 14:35    ASSESSMENT AND PLAN:  This is a very pleasant 83years old white male with a stage IIIB non-small cell lung cancer, adenocarcinoma status post induction systemic chemotherapy with carboplatin and Alimta. He is currently on maintenance treatment with single agent Alimta status post 13 cycles. He is tolerating the treatment well. I recommended for him to proceed with cycle #14 today as a scheduled. I would see him back for follow-up visit in 3 weeks for evaluation before starting the next cycle of his treatment. For hypertension, he was giving a dose of clonidine 0.2 mg by mouth 1 in the clinic today. He was advised to monitor his blood pressure closely at home and to report to his family doctor if no improvement. The patient was advised to call immediately if he has any concerning symptoms in the interval. The patient voices understanding of current disease status and treatment options and is in agreement with the current care plan.  All questions were answered. The patient knows to call the clinic with any problems, questions or concerns. We can certainly see the patient much sooner if necessary. I spent 10 minutes counseling the patient face to face. The total time spent in the appointment was 15 minutes.  Disclaimer: This note was dictated with voice recognition software. Similar sounding words can inadvertently be transcribed and may not be corrected upon review.

## 2016-10-13 DIAGNOSIS — L309 Dermatitis, unspecified: Secondary | ICD-10-CM | POA: Diagnosis not present

## 2016-10-13 DIAGNOSIS — J029 Acute pharyngitis, unspecified: Secondary | ICD-10-CM | POA: Diagnosis not present

## 2016-10-18 ENCOUNTER — Other Ambulatory Visit: Payer: PPO

## 2016-10-18 ENCOUNTER — Ambulatory Visit: Payer: PPO

## 2016-10-18 ENCOUNTER — Ambulatory Visit: Payer: PPO | Admitting: Internal Medicine

## 2016-10-24 ENCOUNTER — Telehealth: Payer: Self-pay

## 2016-10-24 MED ORDER — FOLIC ACID 1 MG PO TABS: 1.0000 mg | ORAL_TABLET | Freq: Every day | ORAL | 4 refills | Status: DC

## 2016-10-24 NOTE — Telephone Encounter (Signed)
Incoming fax for folic acid refill

## 2016-10-25 ENCOUNTER — Other Ambulatory Visit (HOSPITAL_BASED_OUTPATIENT_CLINIC_OR_DEPARTMENT_OTHER): Payer: PPO

## 2016-10-25 ENCOUNTER — Ambulatory Visit (HOSPITAL_BASED_OUTPATIENT_CLINIC_OR_DEPARTMENT_OTHER): Payer: PPO | Admitting: Internal Medicine

## 2016-10-25 ENCOUNTER — Encounter: Payer: Self-pay | Admitting: Internal Medicine

## 2016-10-25 ENCOUNTER — Ambulatory Visit (HOSPITAL_BASED_OUTPATIENT_CLINIC_OR_DEPARTMENT_OTHER): Payer: PPO

## 2016-10-25 ENCOUNTER — Telehealth: Payer: Self-pay | Admitting: Internal Medicine

## 2016-10-25 VITALS — BP 167/67 | HR 64 | Temp 97.7°F | Resp 18 | Ht 68.0 in | Wt 164.4 lb

## 2016-10-25 DIAGNOSIS — C342 Malignant neoplasm of middle lobe, bronchus or lung: Secondary | ICD-10-CM

## 2016-10-25 DIAGNOSIS — C3411 Malignant neoplasm of upper lobe, right bronchus or lung: Secondary | ICD-10-CM

## 2016-10-25 DIAGNOSIS — Z5111 Encounter for antineoplastic chemotherapy: Secondary | ICD-10-CM | POA: Diagnosis not present

## 2016-10-25 DIAGNOSIS — I10 Essential (primary) hypertension: Secondary | ICD-10-CM | POA: Diagnosis not present

## 2016-10-25 DIAGNOSIS — E871 Hypo-osmolality and hyponatremia: Secondary | ICD-10-CM

## 2016-10-25 LAB — CBC WITH DIFFERENTIAL/PLATELET
BASO%: 0.8 % (ref 0.0–2.0)
BASOS ABS: 0.1 10*3/uL (ref 0.0–0.1)
EOS%: 0.1 % (ref 0.0–7.0)
Eosinophils Absolute: 0 10*3/uL (ref 0.0–0.5)
HCT: 33.4 % — ABNORMAL LOW (ref 38.4–49.9)
HEMOGLOBIN: 11.1 g/dL — AB (ref 13.0–17.1)
LYMPH%: 8.5 % — AB (ref 14.0–49.0)
MCH: 33.9 pg — AB (ref 27.2–33.4)
MCHC: 33.3 g/dL (ref 32.0–36.0)
MCV: 101.8 fL — ABNORMAL HIGH (ref 79.3–98.0)
MONO#: 2 10*3/uL — ABNORMAL HIGH (ref 0.1–0.9)
MONO%: 15.6 % — AB (ref 0.0–14.0)
NEUT#: 9.5 10*3/uL — ABNORMAL HIGH (ref 1.5–6.5)
NEUT%: 75 % (ref 39.0–75.0)
Platelets: 588 10*3/uL — ABNORMAL HIGH (ref 140–400)
RBC: 3.28 10*6/uL — AB (ref 4.20–5.82)
RDW: 13.3 % (ref 11.0–14.6)
WBC: 12.7 10*3/uL — ABNORMAL HIGH (ref 4.0–10.3)
lymph#: 1.1 10*3/uL (ref 0.9–3.3)

## 2016-10-25 LAB — COMPREHENSIVE METABOLIC PANEL
ALBUMIN: 3.5 g/dL (ref 3.5–5.0)
ALK PHOS: 79 U/L (ref 40–150)
ALT: 25 U/L (ref 0–55)
AST: 39 U/L — ABNORMAL HIGH (ref 5–34)
Anion Gap: 11 mEq/L (ref 3–11)
BUN: 13.7 mg/dL (ref 7.0–26.0)
CHLORIDE: 97 meq/L — AB (ref 98–109)
CO2: 23 mEq/L (ref 22–29)
Calcium: 10.1 mg/dL (ref 8.4–10.4)
Creatinine: 0.8 mg/dL (ref 0.7–1.3)
EGFR: 82 mL/min/{1.73_m2} — AB (ref >90)
GLUCOSE: 126 mg/dL (ref 70–140)
POTASSIUM: 4.3 meq/L (ref 3.5–5.1)
SODIUM: 130 meq/L — AB (ref 136–145)
Total Bilirubin: 0.46 mg/dL (ref 0.20–1.20)
Total Protein: 6.6 g/dL (ref 6.4–8.3)

## 2016-10-25 MED ORDER — SODIUM CHLORIDE 0.9 % IV SOLN
500.0000 mg/m2 | Freq: Once | INTRAVENOUS | Status: AC
  Administered 2016-10-25: 950 mg via INTRAVENOUS
  Filled 2016-10-25: qty 38

## 2016-10-25 MED ORDER — SODIUM CHLORIDE 0.9% FLUSH
10.0000 mL | INTRAVENOUS | Status: DC | PRN
  Administered 2016-10-25: 10 mL
  Filled 2016-10-25: qty 10

## 2016-10-25 MED ORDER — SODIUM CHLORIDE 0.9 % IV SOLN
Freq: Once | INTRAVENOUS | Status: AC
  Administered 2016-10-25: 11:00:00 via INTRAVENOUS

## 2016-10-25 MED ORDER — CYANOCOBALAMIN 1000 MCG/ML IJ SOLN
INTRAMUSCULAR | Status: AC
  Filled 2016-10-25: qty 1

## 2016-10-25 MED ORDER — CYANOCOBALAMIN 1000 MCG/ML IJ SOLN
1000.0000 ug | Freq: Once | INTRAMUSCULAR | Status: AC
  Administered 2016-10-25: 1000 ug via INTRAMUSCULAR

## 2016-10-25 MED ORDER — PROCHLORPERAZINE MALEATE 10 MG PO TABS
10.0000 mg | ORAL_TABLET | Freq: Once | ORAL | Status: AC
  Administered 2016-10-25: 10 mg via ORAL

## 2016-10-25 MED ORDER — PROCHLORPERAZINE MALEATE 10 MG PO TABS
ORAL_TABLET | ORAL | Status: AC
  Filled 2016-10-25: qty 1

## 2016-10-25 MED ORDER — HEPARIN SOD (PORK) LOCK FLUSH 100 UNIT/ML IV SOLN
500.0000 [IU] | Freq: Once | INTRAVENOUS | Status: AC | PRN
  Administered 2016-10-25: 500 [IU]
  Filled 2016-10-25: qty 5

## 2016-10-25 MED ORDER — FOLIC ACID 1 MG PO TABS: 1.0000 mg | ORAL_TABLET | Freq: Every day | ORAL | 4 refills | Status: DC

## 2016-10-25 NOTE — Progress Notes (Signed)
[facility] Telephone:([phone]   [phone]  OFFICE PROGRESS NOTE  [name], MD [address] [place] [address]  DIAGNOSIS:  Stage IIIB (T1a, N3, M0) non-small cell lung cancer, adenocarcinoma with negative EGFR, ALK, ROS 1 but positive RET mutations presented with right middle lobe lung nodule in addition to mediastinal and left supraclavicular lymphadenopathy diagnosed in November 2016.  PRIOR THERAPY: Systemic chemotherapy with carbo platinum for AUC of 5 and Alimta 500 MG/M2 every 3 weeks status post 6 cycles with partial response.  CURRENT THERAPY: Maintenance systemic chemotherapy with single agent Alimta 500 MG/M2 every 3 weeks, status post 14 cycles.  INTERVAL HISTORY: Sean Cooke 83 y.o. male came to the clinic today for follow-up visit. He is currently on maintenance treatment with single agent Alimta. The patient tolerated the last cycle of his treatment fairly well with no significant adverse effects. The patient continues to have mouth sores. He denied having any chest pain, shortness of breath, cough or hemoptysis. He has no fever or chills. He denied having weight loss or night sweats. He has no nausea or vomiting. He is here today for evaluation before starting cycle #15.  MEDICAL HISTORY: Past Medical History:  Diagnosis Date  . Adenomatous colon polyp 05/2003  . Arthritis   . Degenerative joint disease   . Diverticulosis   . Duodenal diverticulum   . Encounter for antineoplastic chemotherapy 07/31/2015  . GERD (gastroesophageal reflux disease)   . Hemorrhoids   . Hiatal hernia   . Lung cancer ([place])   . Neuritis/radiculitis due to displacement of lumbar intervertebral disc   . Pneumonia   . Rib fracture     ALLERGIES:  is allergic to benzonatate; lyrica [pregabalin]; penicillins; pneumococcal vaccine polyvalent; tizanidine; and tramadol.  MEDICATIONS:  Current Outpatient Prescriptions  Medication  Sig Dispense Refill  . acetaminophen (TYLENOL) 650 MG CR tablet Take 650 mg by mouth every 6 (six) hours as needed for pain or fever. Reported on 09/22/2015    . antiseptic oral rinse (BIOTENE) LIQD 15 mLs by Mouth Rinse route as needed for dry mouth.    Marland Kitchen aspirin 81 MG tablet Take 81 mg by mouth at bedtime.     . betamethasone dipropionate (DIPROLENE) 0.05 % cream Apply topically.    . Calcium Carb-Cholecalciferol (CALCIUM-VITAMIN D) 500-200 MG-UNIT tablet Take 2 tablets by mouth daily.    . cromolyn (OPTICROM) 4 % ophthalmic solution Place 4 drops into both eyes daily.    Marland Kitchen denosumab (PROLIA) 60 MG/ML SOLN injection Inject 60 mg into the skin every 6 (six) months. Administer in upper arm, thigh, or abdomen    . dexamethasone (DECADRON) 4 MG tablet TAKE 1 TABLET BY MOUTH TWICE A DAY DAY BEFORE DAY OF AND DAY AFTER CHEMOTHERAPY EVERY 3 WEEKS 40 tablet 1  . dextromethorphan-guaiFENesin (ROBITUSSIN-DM) 10-100 MG/5ML liquid Take 10 mLs by mouth every 4 (four) hours as needed for cough. Reported on 03/01/2016    . ferrous sulfate 325 (65 FE) MG tablet Take 325 mg by mouth 2 (two) times daily with a meal.    . folic acid (FOLVITE) 1 MG tablet Take 1 tablet (1 mg total) by mouth daily. 30 tablet 4  . guaiFENesin (MUCINEX) 600 MG 12 hr tablet Take 600 mg by mouth 2 (two) times daily as needed.    . lactobacillus acidophilus & bulgar (LACTINEX) chewable tablet Chew 1 tablet by mouth 2 (two) times daily. Store in [place],  [place]    . lidocaine-prilocaine (EMLA) cream Apply 1 application topically as needed. 30 g 3  . loperamide (IMODIUM) 2 MG capsule Take 2 mg by mouth as needed for diarrhea or loose stools. Reported on 03/01/2016    . loratadine (CLARITIN) 10 MG tablet Take 10 mg by mouth.    . losartan (COZAAR) 100 MG tablet Take 100 mg by mouth daily.    . magic mouthwash SOLN Take 5 mLs by mouth 3 (three) times daily as needed for mouth pain. 240 mL 2  . meloxicam (MOBIC) 7.5 MG tablet Take 7.5 mg by mouth  every other day. Reported on 2020-03-2316    . Multiple Vitamin (MULTIVITAMIN) tablet Take 1 tablet by mouth daily.    . Omega-3 Fatty Acids (FISH OIL) 1000 MG CAPS Take 1,000 mg by mouth daily.     Marland Kitchen oxybutynin (DITROPAN) 5 MG tablet Take 5 mg by mouth daily.    . pantoprazole (PROTONIX) 40 MG tablet Take 40 mg by mouth daily.    . polyethylene glycol (MIRALAX / GLYCOLAX) packet Take by mouth.    . Probiotic Product (ALIGN) 4 MG CAPS Take 1 capsule by mouth daily.    . prochlorperazine (COMPAZINE) 10 MG tablet TAKE 1 TABLET (10 MG TOTAL) BY MOUTH EVERY 6 (SIX) HOURS AS NEEDED FOR NAUSEA OR VOMITING. 30 tablet 0  . PROCTOSOL HC 2.5 % rectal cream INSERT INTO THE RECTUM TWO (2) TIMES A DAY.  0  . saw palmetto 160 MG capsule Take 160 mg by mouth daily.    . Tamsulosin HCl (FLOMAX) 0.4 MG CAPS Take 0.4 mg by mouth daily.    Marland Kitchen triamcinolone cream (KENALOG) 0.1 % Apply topically.     No current facility-administered medications for this visit.     SURGICAL HISTORY:  Past Surgical History:  Procedure Laterality Date  . CATARACT EXTRACTION     bilateral  . CHOLECYSTECTOMY  06/2003  . INGUINAL HERNIA REPAIR    . SPLENECTOMY  2002  . TRANSURETHRAL RESECTION OF PROSTATE      REVIEW OF SYSTEMS:  A comprehensive review of systems was negative except for: Constitutional: positive for fatigue Ears, nose, mouth, throat, and face: positive for sore mouth   PHYSICAL EXAMINATION: General appearance: alert, cooperative, fatigued and no distress Head: Normocephalic, without obvious abnormality, atraumatic Neck: no adenopathy, no JVD, supple, symmetrical, trachea midline and thyroid not enlarged, symmetric, no tenderness/mass/nodules Lymph nodes: Cervical, supraclavicular, and axillary nodes normal. Resp: clear to auscultation bilaterally Back: symmetric, no curvature. ROM normal. No CVA tenderness. Cardio: regular rate and rhythm, S1, S2 normal, no murmur, click, rub or gallop GI: soft, non-tender;  bowel sounds normal; no masses,  no organomegaly Extremities: extremities normal, atraumatic, no cyanosis or edema  ECOG PERFORMANCE STATUS: 1 - Symptomatic but completely ambulatory  Blood pressure (!) 167/67, pulse 64, temperature 97.7 F (36.5 C), temperature source Oral, resp. rate 18, height 5' 8" (1.727 m), weight 164 lb 6.4 oz (74.6 kg), SpO2 100 %.  LABORATORY DATA: Lab Results  Component Value Date   WBC 12.7 (H) 10/25/2016   HGB 11.1 (L) 10/25/2016   HCT 33.4 (L) 10/25/2016   MCV 101.8 (H) 10/25/2016   PLT 588 (H) 10/25/2016      Chemistry      Component Value Date/Time   NA 130 (L) 10/25/2016 0940   K 4.3 10/25/2016 0940   CL 100 (L) 08/30/2015 1150   CO2 23 10/25/2016 0940   BUN 13.7 10/25/2016 0940  CREATININE 0.8 10/25/2016 0940      Component Value Date/Time   CALCIUM 10.1 10/25/2016 0940   ALKPHOS 79 10/25/2016 0940   AST 39 (H) 10/25/2016 0940   ALT 25 10/25/2016 0940   BILITOT 0.46 10/25/2016 0940       RADIOGRAPHIC STUDIES: No results found.  ASSESSMENT AND PLAN:  This is a very pleasant 83 years old white male with a stage IIIB non-small cell lung cancer, adenocarcinoma. He is status post induction systemic chemotherapy with carboplatin and Alimta and currently on maintenance treatment with Alimta status post 14 cycles. He continues to tolerate his treatment well except for the mouth sores from his denture. I recommended for the patient to proceed with cycle #15 today as a scheduled. I would see him back for follow-up visit in 3 weeks for evaluation after repeating CT scan of the chest for restaging of his disease. For the mouth sores, he will continue treatment with Magic mouthwash and Biotene. For the hypertension, the patient was advised to take his blood pressure medication. He was advised to call immediately if he has any concerning symptoms in the interval. The patient voices understanding of current disease status and treatment options and  is in agreement with the current care plan.  All questions were answered. The patient knows to call the clinic with any problems, questions or concerns. We can certainly see the patient much sooner if necessary. I spent 10 minutes counseling the patient face to face. The total time spent in the appointment was 15 minutes.  Disclaimer: This note was dictated with voice recognition software. Similar sounding words can inadvertently be transcribed and may not be corrected upon review.

## 2016-10-25 NOTE — Patient Instructions (Signed)
[facility] Discharge Instructions for Patients Receiving Chemotherapy  Today you received the following chemotherapy agents; Alimta.   To help prevent nausea and vomiting after your treatment, we encourage you to take your nausea medication as directed.    If you develop nausea and vomiting that is not controlled by your nausea medication, call the clinic.   BELOW ARE SYMPTOMS THAT SHOULD BE REPORTED IMMEDIATELY:  *FEVER GREATER THAN 100.5 F  *CHILLS WITH OR WITHOUT FEVER  NAUSEA AND VOMITING THAT IS NOT CONTROLLED WITH YOUR NAUSEA MEDICATION  *UNUSUAL SHORTNESS OF BREATH  *UNUSUAL BRUISING OR BLEEDING  TENDERNESS IN MOUTH AND THROAT WITH OR WITHOUT PRESENCE OF ULCERS  *URINARY PROBLEMS  *BOWEL PROBLEMS  UNUSUAL RASH Items with * indicate a potential emergency and should be followed up as soon as possible.  Feel free to call the clinic you have any questions or concerns. The clinic phone number is [phone].  Please show the CHEMO ALERT CARD at check-in to the Emergency Department and triage nurse.   

## 2016-10-25 NOTE — Telephone Encounter (Signed)
Appointments scheduled per 10/25/16 los. Patient was given a copy of the AVS report and appointment schedule, per 10/25/16 los. °

## 2016-10-26 ENCOUNTER — Ambulatory Visit: Payer: PPO | Admitting: Internal Medicine

## 2016-10-26 ENCOUNTER — Other Ambulatory Visit: Payer: PPO

## 2016-10-26 ENCOUNTER — Ambulatory Visit: Payer: PPO

## 2016-10-30 ENCOUNTER — Telehealth: Payer: Self-pay | Admitting: Internal Medicine

## 2016-10-30 NOTE — Telephone Encounter (Signed)
Patient called and would like to move his appointment to 5/23 he will be out of town

## 2016-11-02 ENCOUNTER — Telehealth: Payer: Self-pay | Admitting: Internal Medicine

## 2016-11-02 NOTE — Telephone Encounter (Signed)
Confirmed appt r/s with patient. Patient is aware of new time and date.

## 2016-11-09 MED FILL — MAGIC MOUTHWASH BOP FORM: 12 days supply | Qty: 240 | Fill #1

## 2016-11-13 ENCOUNTER — Ambulatory Visit (HOSPITAL_COMMUNITY)
Admission: RE | Admit: 2016-11-13 | Discharge: 2016-11-13 | Disposition: A | Discharge status: Home / Self Care | Payer: PPO | Source: Ambulatory Visit | Attending: Internal Medicine | Admitting: Internal Medicine

## 2016-11-13 DIAGNOSIS — I700 Atherosclerosis of aorta: Secondary | ICD-10-CM | POA: Insufficient documentation

## 2016-11-13 DIAGNOSIS — C3411 Malignant neoplasm of upper lobe, right bronchus or lung: Secondary | ICD-10-CM | POA: Diagnosis not present

## 2016-11-13 DIAGNOSIS — J90 Pleural effusion, not elsewhere classified: Secondary | ICD-10-CM | POA: Diagnosis not present

## 2016-11-13 DIAGNOSIS — I313 Pericardial effusion (noninflammatory): Secondary | ICD-10-CM | POA: Diagnosis not present

## 2016-11-13 DIAGNOSIS — I10 Essential (primary) hypertension: Secondary | ICD-10-CM

## 2016-11-13 DIAGNOSIS — E871 Hypo-osmolality and hyponatremia: Secondary | ICD-10-CM | POA: Diagnosis not present

## 2016-11-13 DIAGNOSIS — Z5111 Encounter for antineoplastic chemotherapy: Secondary | ICD-10-CM

## 2016-11-13 MED ORDER — IOPAMIDOL (ISOVUE-300) INJECTION 61%
INTRAVENOUS | Status: AC
  Administered 2016-11-13: 75 mL
  Filled 2016-11-13: qty 75

## 2016-11-15 ENCOUNTER — Other Ambulatory Visit (HOSPITAL_BASED_OUTPATIENT_CLINIC_OR_DEPARTMENT_OTHER): Payer: PPO

## 2016-11-15 ENCOUNTER — Ambulatory Visit (HOSPITAL_BASED_OUTPATIENT_CLINIC_OR_DEPARTMENT_OTHER): Payer: PPO | Admitting: Internal Medicine

## 2016-11-15 ENCOUNTER — Telehealth: Payer: Self-pay | Admitting: Internal Medicine

## 2016-11-15 ENCOUNTER — Encounter: Payer: Self-pay | Admitting: Internal Medicine

## 2016-11-15 ENCOUNTER — Ambulatory Visit: Payer: PPO

## 2016-11-15 VITALS — BP 180/71 | HR 67 | Temp 97.9°F | Resp 18 | Wt 164.6 lb

## 2016-11-15 DIAGNOSIS — C3411 Malignant neoplasm of upper lobe, right bronchus or lung: Secondary | ICD-10-CM

## 2016-11-15 DIAGNOSIS — R5383 Other fatigue: Secondary | ICD-10-CM

## 2016-11-15 DIAGNOSIS — I10 Essential (primary) hypertension: Secondary | ICD-10-CM

## 2016-11-15 DIAGNOSIS — D649 Anemia, unspecified: Secondary | ICD-10-CM

## 2016-11-15 DIAGNOSIS — C342 Malignant neoplasm of middle lobe, bronchus or lung: Secondary | ICD-10-CM | POA: Diagnosis not present

## 2016-11-15 DIAGNOSIS — Z5111 Encounter for antineoplastic chemotherapy: Secondary | ICD-10-CM

## 2016-11-15 LAB — CBC WITH DIFFERENTIAL/PLATELET
BASO%: 0.1 % (ref 0.0–2.0)
BASOS ABS: 0 10*3/uL (ref 0.0–0.1)
EOS ABS: 0 10*3/uL (ref 0.0–0.5)
EOS%: 0 % (ref 0.0–7.0)
HEMATOCRIT: 32.6 % — AB (ref 38.4–49.9)
HGB: 10.9 g/dL — ABNORMAL LOW (ref 13.0–17.1)
LYMPH#: 0.8 10*3/uL — AB (ref 0.9–3.3)
LYMPH%: 6.3 % — ABNORMAL LOW (ref 14.0–49.0)
MCH: 33.5 pg — AB (ref 27.2–33.4)
MCHC: 33.4 g/dL (ref 32.0–36.0)
MCV: 100.3 fL — AB (ref 79.3–98.0)
MONO#: 1.1 10*3/uL — AB (ref 0.1–0.9)
MONO%: 8 % (ref 0.0–14.0)
NEUT#: 11.4 10*3/uL — ABNORMAL HIGH (ref 1.5–6.5)
NEUT%: 85.6 % — AB (ref 39.0–75.0)
PLATELETS: 624 10*3/uL — AB (ref 140–400)
RBC: 3.25 10*6/uL — ABNORMAL LOW (ref 4.20–5.82)
RDW: 13.7 % (ref 11.0–14.6)
WBC: 13.3 10*3/uL — ABNORMAL HIGH (ref 4.0–10.3)

## 2016-11-15 LAB — COMPREHENSIVE METABOLIC PANEL
ALT: 28 U/L (ref 0–55)
ANION GAP: 9 meq/L (ref 3–11)
AST: 37 U/L — ABNORMAL HIGH (ref 5–34)
Albumin: 3.6 g/dL (ref 3.5–5.0)
Alkaline Phosphatase: 73 U/L (ref 40–150)
BUN: 10.5 mg/dL (ref 7.0–26.0)
CALCIUM: 10.1 mg/dL (ref 8.4–10.4)
CHLORIDE: 97 meq/L — AB (ref 98–109)
CO2: 23 mEq/L (ref 22–29)
CREATININE: 0.7 mg/dL (ref 0.7–1.3)
EGFR: 86 mL/min/{1.73_m2} — AB (ref >90)
Glucose: 112 mg/dl (ref 70–140)
Potassium: 4.5 mEq/L (ref 3.5–5.1)
Sodium: 129 mEq/L — ABNORMAL LOW (ref 136–145)
Total Bilirubin: 0.61 mg/dL (ref 0.20–1.20)
Total Protein: 6.5 g/dL (ref 6.4–8.3)

## 2016-11-15 NOTE — Progress Notes (Signed)
Sean Cooke [phone]   [phone]  OFFICE PROGRESS NOTE  [name], MD [address] [place] [address]  DIAGNOSIS:  Stage IIIB (T1a, N3, M0) non-small cell lung cancer, adenocarcinoma with negative EGFR, ALK, ROS 1 but positive RET mutations presented with right middle lobe lung nodule in addition to mediastinal and left supraclavicular lymphadenopathy diagnosed in November 2016.  PRIOR THERAPY: Systemic chemotherapy with carbo platinum for AUC of 5 and Alimta 500 MG/M2 every 3 weeks status post 6 cycles with partial response.  CURRENT THERAPY: Maintenance systemic chemotherapy with single agent Alimta 500 MG/M2 every 3 weeks, status post 15 cycles.  INTERVAL HISTORY: Sean Cooke 83 y.o. male returns to the clinic today for follow-up visit. The patient is currently on treatment with maintenance Alimta status post 15 cycles and has been tolerating his treatment well except for fatigue. He denied having any current chest pain but continues to have shortness of breath with exertion with no cough or hemoptysis. He has no fever or chills. He denied having any nausea, vomiting, diarrhea or constipation. He had repeat CT scan of the chest performed recently and he is here for evaluation and discussion of his scan results.  MEDICAL HISTORY: Past Medical History:  Diagnosis Date  . Adenomatous colon polyp 05/2003  . Arthritis   . Degenerative joint disease   . Diverticulosis   . Duodenal diverticulum   . Encounter for antineoplastic chemotherapy 07/31/2015  . GERD (gastroesophageal reflux disease)   . Hemorrhoids   . Hiatal hernia   . Lung cancer ([place])   . Neuritis/radiculitis due to displacement of lumbar intervertebral disc   . Pneumonia   . Rib fracture     ALLERGIES:  is allergic to benzonatate; lyrica [pregabalin]; penicillins; pneumococcal vaccine polyvalent; tizanidine; and tramadol.  MEDICATIONS:  Current  Outpatient Prescriptions  Medication Sig Dispense Refill  . acetaminophen (TYLENOL) 650 MG CR tablet Take 650 mg by mouth every 6 (six) hours as needed for pain or fever. Reported on 09/22/2015    . antiseptic oral rinse (BIOTENE) LIQD 15 mLs by Mouth Rinse route as needed for dry mouth.    Marland Kitchen aspirin 81 MG tablet Take 81 mg by mouth at bedtime.     . betamethasone dipropionate (DIPROLENE) 0.05 % cream Apply topically.    . Calcium Carb-Cholecalciferol (CALCIUM-VITAMIN D) 500-200 MG-UNIT tablet Take 2 tablets by mouth daily.    . cromolyn (OPTICROM) 4 % ophthalmic solution Place 4 drops into both eyes daily.    Marland Kitchen denosumab (PROLIA) 60 MG/ML SOLN injection Inject 60 mg into the skin every 6 (six) months. Administer in upper arm, thigh, or abdomen    . dexamethasone (DECADRON) 4 MG tablet TAKE 1 TABLET BY MOUTH TWICE A DAY DAY BEFORE DAY OF AND DAY AFTER CHEMOTHERAPY EVERY 3 WEEKS 40 tablet 1  . ferrous sulfate 325 (65 FE) MG tablet Take 325 mg by mouth 2 (two) times daily with a meal.    . folic acid (FOLVITE) 1 MG tablet Take 1 tablet (1 mg total) by mouth daily. 30 tablet 4  . guaiFENesin (MUCINEX) 600 MG 12 hr tablet Take 600 mg by mouth 2 (two) times daily as needed.    . lactobacillus acidophilus & bulgar (LACTINEX) chewable tablet Chew 1 tablet by mouth 2 (two) times daily. Store in [place], [place]    . lidocaine-prilocaine (EMLA) cream Apply 1 application topically as needed. 30 g 3  .  loratadine (CLARITIN) 10 MG tablet Take 10 mg by mouth.    . losartan (COZAAR) 100 MG tablet Take 100 mg by mouth daily.    . magic mouthwash SOLN Take 5 mLs by mouth 3 (three) times daily as needed for mouth pain. 240 mL 2  . meloxicam (MOBIC) 7.5 MG tablet Take 7.5 mg by mouth every other day. Reported on 10/27/2015    . Multiple Vitamin (MULTIVITAMIN) tablet Take 1 tablet by mouth daily.    . Omega-3 Fatty Acids (FISH OIL) 1000 MG CAPS Take 1,000 mg by mouth daily.     Marland Kitchen oxybutynin (DITROPAN) 5 MG tablet Take 5  mg by mouth daily.    . pantoprazole (PROTONIX) 40 MG tablet Take 40 mg by mouth daily.    . polyethylene glycol (MIRALAX / GLYCOLAX) packet Take by mouth.    . Probiotic Product (ALIGN) 4 MG CAPS Take 1 capsule by mouth daily.    Marland Kitchen PROCTOSOL HC 2.5 % rectal cream INSERT INTO THE RECTUM TWO (2) TIMES A DAY.  0  . saw palmetto 160 MG capsule Take 160 mg by mouth daily.    . Tamsulosin HCl (FLOMAX) 0.4 MG CAPS Take 0.4 mg by mouth daily.    Marland Kitchen triamcinolone cream (KENALOG) 0.1 % Apply topically.    Marland Kitchen dextromethorphan-guaiFENesin (ROBITUSSIN-DM) 10-100 MG/5ML liquid Take 10 mLs by mouth every 4 (four) hours as needed for cough. Reported on 03/01/2016    . loperamide (IMODIUM) 2 MG capsule Take 2 mg by mouth as needed for diarrhea or loose stools. Reported on 03/01/2016    . prochlorperazine (COMPAZINE) 10 MG tablet TAKE 1 TABLET (10 MG TOTAL) BY MOUTH EVERY 6 (SIX) HOURS AS NEEDED FOR NAUSEA OR VOMITING. (Patient not taking: Reported on 11/15/2016) 30 tablet 0   No current facility-administered medications for this visit.     SURGICAL HISTORY:  Past Surgical History:  Procedure Laterality Date  . CATARACT EXTRACTION     bilateral  . CHOLECYSTECTOMY  06/2003  . INGUINAL HERNIA REPAIR    . SPLENECTOMY  2002  . TRANSURETHRAL RESECTION OF PROSTATE      REVIEW OF SYSTEMS:  Constitutional: positive for fatigue Eyes: negative Ears, nose, mouth, throat, and face: negative Respiratory: positive for dyspnea on exertion Cardiovascular: negative Gastrointestinal: negative Genitourinary:negative Integument/breast: negative Hematologic/lymphatic: negative Musculoskeletal:negative Neurological: negative Behavioral/Psych: negative Endocrine: negative Allergic/Immunologic: negative   PHYSICAL EXAMINATION: General appearance: alert, cooperative, fatigued and no distress Head: Normocephalic, without obvious abnormality, atraumatic Neck: no adenopathy, no JVD, supple, symmetrical, trachea midline and  thyroid not enlarged, symmetric, no tenderness/mass/nodules Lymph nodes: Cervical, supraclavicular, and axillary nodes normal. Resp: clear to auscultation bilaterally Back: symmetric, no curvature. ROM normal. No CVA tenderness. Cardio: regular rate and rhythm, S1, S2 normal, no murmur, click, rub or gallop GI: soft, non-tender; bowel sounds normal; no masses,  no organomegaly Extremities: extremities normal, atraumatic, no cyanosis or edema Neurologic: Alert and oriented X 3, normal strength and tone. Normal symmetric reflexes. Normal coordination and gait  ECOG PERFORMANCE STATUS: 1 - Symptomatic but completely ambulatory  Blood pressure (!) 180/71, pulse 67, temperature 97.9 F (36.6 C), temperature source Oral, resp. rate 18, weight 164 lb 9.6 oz (74.7 kg), SpO2 97 %.  LABORATORY DATA: Lab Results  Component Value Date   WBC 13.3 (H) 11/15/2016   HGB 10.9 (L) 11/15/2016   HCT 32.6 (L) 11/15/2016   MCV 100.3 (H) 11/15/2016   PLT 624 (H) 11/15/2016      Chemistry  Component Value Date/Time   NA 129 (L) 11/15/2016 1140   K 4.5 11/15/2016 1140   CL 100 (L) 08/30/2015 1150   CO2 23 11/15/2016 1140   BUN 10.5 11/15/2016 1140   CREATININE 0.7 11/15/2016 1140      Component Value Date/Time   CALCIUM 10.1 11/15/2016 1140   ALKPHOS 73 11/15/2016 1140   AST 37 (H) 11/15/2016 1140   ALT 28 11/15/2016 1140   BILITOT 0.61 11/15/2016 1140       RADIOGRAPHIC STUDIES: Ct Chest W Contrast  Result Date: 11/13/2016 CLINICAL DATA:  Patient with history of lung cancer. On chemotherapy. EXAM: CT CHEST WITH CONTRAST TECHNIQUE: Multidetector CT imaging of the chest was performed during intravenous contrast administration. CONTRAST:  18m ISOVUE-300 IOPAMIDOL (ISOVUE-300) INJECTION 61% COMPARISON:  Chest CT 09/07/2016 FINDINGS: Cardiovascular: Right anterior chest wall Port-A-Cath is present with tip terminating in the superior vena cava. Normal heart size. Small pericardial effusion.  Aorta and main pulmonary artery are normal in caliber. Mediastinum/Nodes: No enlarged axillary, mediastinal or hilar lymphadenopathy. Normal esophagus. Lungs/Pleura: Central airways are patent. Subpleural atelectasis within the lower lobes bilaterally. No large area of pulmonary consolidation. No pleural effusion or pneumothorax. Stable scarring right middle lobe. Unchanged 4 mm left upper lobe nodule (image 86; series 5). Liver is normal in size and contour. Atrophy of the pancreatic parenchyma. Fat containing ventral abdominal wall hernia. Upper Abdomen: Thoracic spine degenerative changes. No aggressive or acute appearing osseous lesions. Musculoskeletal: No aggressive or acute appearing osseous lesions. IMPRESSION: No evidence for locally recurrent or metastatic disease. Moderate pericardial effusion. Aortic atherosclerosis. Electronically Signed   By: DLovey NewcomerM.D.   On: 11/13/2016 16:03    ASSESSMENT AND PLAN:  This is a very pleasant 83years old white male with a stage IIIB non-small cell lung cancer, adenocarcinoma status post induction chemotherapy with carboplatin and Alimta. He is currently undergoing maintenance treatment with single agent Alimta status post 15 cycles. Has been tolerating his treatment well except for increasing fatigue and weakness. The patient had repeat CT scan of the chest performed recently. I personally and independently reviewed the scan and discuss the results with the patient today. His scan showed no clear evidence for locally recurrent or metastatic disease. I had a lengthy discussion with the patient today about his condition. He has increasing fatigue from the treatment and was looking for chemotherapy break. I recommended for him to continue on observation for now with repeat CT scan of the chest in 3 months for restaging of his disease. For the marrow source he will continue with the Magic mouthwash as well as Biotene. For hypertension, I strongly recommend  for the patient to monitor his blood pressure closely and to reconsult with his cardiologist and primary care physician for adjustment of his medication. He was advised to call immediately if he has any concerning symptoms in the interval. The patient voices understanding of current disease status and treatment options and is in agreement with the current care plan.  All questions were answered. The patient knows to call the clinic with any problems, questions or concerns. We can certainly see the patient much sooner if necessary.  Disclaimer: This note was dictated with voice recognition software. Similar sounding words can inadvertently be transcribed and may not be corrected upon review.

## 2016-11-15 NOTE — Telephone Encounter (Signed)
Appointments scheduled per 4.4.18 LOS. Patient given AVS report and calendars with future scheduled appointments.

## 2016-11-16 ENCOUNTER — Telehealth: Payer: Self-pay | Admitting: Medical Oncology

## 2016-11-16 NOTE — Telephone Encounter (Signed)
-----   Message from Curt Bears, MD sent at 11/15/2016  7:31 PM EDT ----- Regarding: RE: continue iron? yes ----- Message ----- From: Ardeen Garland, RN Sent: 11/15/2016   1:21 PM To: Curt Bears, MD Subject: continue iron?                                 Does he need to continue iron?

## 2016-11-16 NOTE — Telephone Encounter (Signed)
Pt.notified

## 2016-12-05 ENCOUNTER — Other Ambulatory Visit: Payer: Self-pay | Admitting: *Deleted

## 2016-12-05 DIAGNOSIS — K121 Other forms of stomatitis: Secondary | ICD-10-CM

## 2016-12-05 MED ORDER — MAGIC MOUTHWASH: 5.0000 mL | Freq: Three times a day (TID) | ORAL | 2 refills | Status: DC | PRN

## 2016-12-06 ENCOUNTER — Ambulatory Visit: Payer: PPO | Admitting: Internal Medicine

## 2016-12-06 ENCOUNTER — Other Ambulatory Visit: Payer: PPO

## 2016-12-06 ENCOUNTER — Ambulatory Visit: Payer: PPO

## 2016-12-18 DIAGNOSIS — M48061 Spinal stenosis, lumbar region without neurogenic claudication: Secondary | ICD-10-CM | POA: Diagnosis not present

## 2016-12-26 DIAGNOSIS — R7303 Prediabetes: Secondary | ICD-10-CM | POA: Diagnosis not present

## 2016-12-26 DIAGNOSIS — E871 Hypo-osmolality and hyponatremia: Secondary | ICD-10-CM | POA: Diagnosis not present

## 2016-12-26 DIAGNOSIS — D473 Essential (hemorrhagic) thrombocythemia: Secondary | ICD-10-CM | POA: Diagnosis not present

## 2016-12-26 DIAGNOSIS — I10 Essential (primary) hypertension: Secondary | ICD-10-CM | POA: Diagnosis not present

## 2016-12-27 ENCOUNTER — Ambulatory Visit: Payer: PPO | Admitting: Internal Medicine

## 2016-12-27 ENCOUNTER — Other Ambulatory Visit: Payer: PPO

## 2016-12-27 ENCOUNTER — Ambulatory Visit: Payer: PPO

## 2017-01-03 ENCOUNTER — Ambulatory Visit: Payer: PPO

## 2017-01-03 ENCOUNTER — Ambulatory Visit: Payer: PPO | Admitting: Internal Medicine

## 2017-01-03 ENCOUNTER — Other Ambulatory Visit: Payer: PPO

## 2017-01-03 DIAGNOSIS — M48061 Spinal stenosis, lumbar region without neurogenic claudication: Secondary | ICD-10-CM | POA: Diagnosis not present

## 2017-01-09 DIAGNOSIS — M810 Age-related osteoporosis without current pathological fracture: Secondary | ICD-10-CM | POA: Diagnosis not present

## 2017-01-09 DIAGNOSIS — E559 Vitamin D deficiency, unspecified: Secondary | ICD-10-CM | POA: Diagnosis not present

## 2017-01-09 DIAGNOSIS — E871 Hypo-osmolality and hyponatremia: Secondary | ICD-10-CM | POA: Diagnosis not present

## 2017-01-10 DIAGNOSIS — M48061 Spinal stenosis, lumbar region without neurogenic claudication: Secondary | ICD-10-CM | POA: Diagnosis not present

## 2017-01-17 DIAGNOSIS — M48061 Spinal stenosis, lumbar region without neurogenic claudication: Secondary | ICD-10-CM | POA: Diagnosis not present

## 2017-01-25 DIAGNOSIS — M48061 Spinal stenosis, lumbar region without neurogenic claudication: Secondary | ICD-10-CM | POA: Diagnosis not present

## 2017-02-08 DIAGNOSIS — M810 Age-related osteoporosis without current pathological fracture: Secondary | ICD-10-CM | POA: Diagnosis not present

## 2017-02-12 ENCOUNTER — Telehealth: Payer: Self-pay | Admitting: *Deleted

## 2017-02-12 ENCOUNTER — Telehealth: Payer: Self-pay

## 2017-02-12 NOTE — Telephone Encounter (Signed)
LMOVM for pt with upcoming appts and times.Request for pt to call back to confirm message received.

## 2017-02-12 NOTE — Telephone Encounter (Signed)
Pt called to clarify appts and times. Done.

## 2017-02-13 ENCOUNTER — Other Ambulatory Visit (HOSPITAL_BASED_OUTPATIENT_CLINIC_OR_DEPARTMENT_OTHER): Payer: PPO

## 2017-02-13 DIAGNOSIS — C3411 Malignant neoplasm of upper lobe, right bronchus or lung: Secondary | ICD-10-CM

## 2017-02-13 DIAGNOSIS — C342 Malignant neoplasm of middle lobe, bronchus or lung: Secondary | ICD-10-CM | POA: Diagnosis not present

## 2017-02-13 LAB — CBC WITH DIFFERENTIAL/PLATELET
BASO%: 0.2 % (ref 0.0–2.0)
BASOS ABS: 0 10*3/uL (ref 0.0–0.1)
EOS ABS: 0.1 10*3/uL (ref 0.0–0.5)
EOS%: 1.2 % (ref 0.0–7.0)
HCT: 35.2 % — ABNORMAL LOW (ref 38.4–49.9)
HGB: 12.2 g/dL — ABNORMAL LOW (ref 13.0–17.1)
LYMPH%: 16.4 % (ref 14.0–49.0)
MCH: 33.5 pg — AB (ref 27.2–33.4)
MCHC: 34.7 g/dL (ref 32.0–36.0)
MCV: 96.7 fL (ref 79.3–98.0)
MONO#: 1.5 10*3/uL — ABNORMAL HIGH (ref 0.1–0.9)
MONO%: 15.2 % — ABNORMAL HIGH (ref 0.0–14.0)
NEUT#: 6.6 10*3/uL — ABNORMAL HIGH (ref 1.5–6.5)
NEUT%: 67 % (ref 39.0–75.0)
Platelets: 343 10*3/uL (ref 140–400)
RBC: 3.64 10*6/uL — AB (ref 4.20–5.82)
RDW: 12.6 % (ref 11.0–14.6)
WBC: 9.8 10*3/uL (ref 4.0–10.3)
lymph#: 1.6 10*3/uL (ref 0.9–3.3)

## 2017-02-13 LAB — COMPREHENSIVE METABOLIC PANEL
ALT: 23 U/L (ref 0–55)
AST: 29 U/L (ref 5–34)
Albumin: 3.7 g/dL (ref 3.5–5.0)
Alkaline Phosphatase: 64 U/L (ref 40–150)
Anion Gap: 7 mEq/L (ref 3–11)
BUN: 10.8 mg/dL (ref 7.0–26.0)
CHLORIDE: 94 meq/L — AB (ref 98–109)
CO2: 27 meq/L (ref 22–29)
Calcium: 10.2 mg/dL (ref 8.4–10.4)
Creatinine: 0.7 mg/dL (ref 0.7–1.3)
EGFR: 85 mL/min/{1.73_m2} — AB (ref >90)
GLUCOSE: 103 mg/dL (ref 70–140)
POTASSIUM: 4.9 meq/L (ref 3.5–5.1)
SODIUM: 129 meq/L — AB (ref 136–145)
Total Bilirubin: 0.59 mg/dL (ref 0.20–1.20)
Total Protein: 6.6 g/dL (ref 6.4–8.3)

## 2017-02-15 ENCOUNTER — Ambulatory Visit (HOSPITAL_BASED_OUTPATIENT_CLINIC_OR_DEPARTMENT_OTHER): Payer: PPO | Admitting: Internal Medicine

## 2017-02-15 ENCOUNTER — Telehealth: Payer: Self-pay | Admitting: Internal Medicine

## 2017-02-15 ENCOUNTER — Ambulatory Visit (HOSPITAL_COMMUNITY)
Admission: RE | Admit: 2017-02-15 | Discharge: 2017-02-15 | Disposition: A | Discharge status: Home / Self Care | Payer: PPO | Source: Ambulatory Visit | Attending: Internal Medicine | Admitting: Internal Medicine

## 2017-02-15 ENCOUNTER — Encounter: Payer: Self-pay | Admitting: Internal Medicine

## 2017-02-15 VITALS — BP 152/58 | HR 71 | Temp 98.4°F | Resp 18 | Ht 68.0 in | Wt 158.7 lb

## 2017-02-15 DIAGNOSIS — Z85118 Personal history of other malignant neoplasm of bronchus and lung: Secondary | ICD-10-CM

## 2017-02-15 DIAGNOSIS — K121 Other forms of stomatitis: Secondary | ICD-10-CM

## 2017-02-15 DIAGNOSIS — C3411 Malignant neoplasm of upper lobe, right bronchus or lung: Secondary | ICD-10-CM

## 2017-02-15 DIAGNOSIS — I700 Atherosclerosis of aorta: Secondary | ICD-10-CM | POA: Insufficient documentation

## 2017-02-15 MED ORDER — IOPAMIDOL (ISOVUE-300) INJECTION 61%
75.0000 mL | Freq: Once | INTRAVENOUS | Status: AC | PRN
  Administered 2017-02-15: 75 mL via INTRAVENOUS

## 2017-02-15 MED ORDER — MAGIC MOUTHWASH: 5.0000 mL | Freq: Three times a day (TID) | ORAL | 2 refills | Status: DC | PRN

## 2017-02-15 MED ORDER — IOPAMIDOL (ISOVUE-300) INJECTION 61%
INTRAVENOUS | Status: AC
  Filled 2017-02-15: qty 75

## 2017-02-15 NOTE — Progress Notes (Signed)
Sean Cooke [phone]   Fax:(336) [phone]  OFFICE PROGRESS NOTE  Sean Coma., MD [address] [place] [address]  DIAGNOSIS:  Stage IIIB (T1a, N3, M0) non-small cell lung cancer, adenocarcinoma with negative EGFR, ALK, ROS 1 but positive RET mutations presented with right middle lobe lung nodule in addition to mediastinal and left supraclavicular lymphadenopathy diagnosed in November 2016.  PRIOR THERAPY:  1) Systemic chemotherapy with carboplatin for AUC of 5 and Alimta 500 MG/M2 every 3 weeks status post 6 cycles with partial response. 2) Maintenance systemic chemotherapy with single agent Alimta 500 MG/M2 every 3 weeks, status post 15 cycles.  CURRENT THERAPY: Observation.  INTERVAL HISTORY: Sean Cooke 83 y.o. male returns to the clinic today for follow-up visit. The patient is feeling fine today with no specific complaints except for the persistent mouth sores. Has been off treatment for 3 months and continues to have some mouth sores which likely secondary to his gum disease and dentures. He continues on Magic mouthwash. He denied having any chest pain, shortness of breath, cough or hemoptysis. He denied having any fever or chills. He has no nausea, vomiting, diarrhea or constipation. He has no weight loss or night sweats. He had repeat CT scan of the chest performed recently and is here for evaluation and discussion of his scan results.  MEDICAL HISTORY: Past Medical History:  Diagnosis Date  . Adenomatous colon polyp 05/2003  . Arthritis   . Degenerative joint disease   . Diverticulosis   . Duodenal diverticulum   . Encounter for antineoplastic chemotherapy 07/31/2015  . GERD (gastroesophageal reflux disease)   . Hemorrhoids   . Hiatal hernia   . Lung cancer ([place])   . Neuritis/radiculitis due to displacement of lumbar intervertebral disc   . Pneumonia   . Rib fracture     ALLERGIES:  is allergic to  benzonatate; lyrica [pregabalin]; penicillins; pneumococcal vaccine polyvalent; tizanidine; and tramadol.  MEDICATIONS:  Current Outpatient Prescriptions  Medication Sig Dispense Refill  . acetaminophen (TYLENOL) 650 MG CR tablet Take 650 mg by mouth every 6 (six) hours as needed for pain or fever. Reported on 09/22/2015    . antiseptic oral rinse (BIOTENE) LIQD 15 mLs by Mouth Rinse route as needed for dry mouth.    Marland Kitchen aspirin 81 MG tablet Take 81 mg by mouth at bedtime.     . betamethasone dipropionate (DIPROLENE) 0.05 % cream Apply topically.    . Calcium Carb-Cholecalciferol (CALCIUM-VITAMIN D) 500-200 MG-UNIT tablet Take 2 tablets by mouth daily.    . cromolyn (OPTICROM) 4 % ophthalmic solution Place 4 drops into both eyes daily.    Marland Kitchen denosumab (PROLIA) 60 MG/ML SOLN injection Inject 60 mg into the skin every 6 (six) months. Administer in upper arm, thigh, or abdomen    . dexamethasone (DECADRON) 4 MG tablet TAKE 1 TABLET BY MOUTH TWICE A DAY DAY BEFORE DAY OF AND DAY AFTER CHEMOTHERAPY EVERY 3 WEEKS 40 tablet 1  . dextromethorphan-guaiFENesin (ROBITUSSIN-DM) 10-100 MG/5ML liquid Take 10 mLs by mouth every 4 (four) hours as needed for cough. Reported on 03/01/2016    . ferrous sulfate 325 (65 FE) MG tablet Take 325 mg by mouth 2 (two) times daily with a meal.    . folic acid (FOLVITE) 1 MG tablet Take 1 tablet (1 mg total) by mouth daily. 30 tablet 4  . guaiFENesin (MUCINEX) 600 MG 12 hr tablet Take 600 mg by mouth  2 (two) times daily as needed.    . lactobacillus acidophilus & bulgar (LACTINEX) chewable tablet Chew 1 tablet by mouth 2 (two) times daily. Store in [place], [place]    . lidocaine-prilocaine (EMLA) cream Apply 1 application topically as needed. 30 g 3  . loperamide (IMODIUM) 2 MG capsule Take 2 mg by mouth as needed for diarrhea or loose stools. Reported on 03/01/2016    . loratadine (CLARITIN) 10 MG tablet Take 10 mg by mouth.    . losartan (COZAAR) 100 MG tablet Take 100 mg by mouth  daily.    . magic mouthwash SOLN Take 5 mLs by mouth 3 (three) times daily as needed for mouth pain. 240 mL 2  . meloxicam (MOBIC) 7.5 MG tablet Take 7.5 mg by mouth every other day. Reported on 10/27/2015    . Multiple Vitamin (MULTIVITAMIN) tablet Take 1 tablet by mouth daily.    . Omega-3 Fatty Acids (FISH OIL) 1000 MG CAPS Take 1,000 mg by mouth daily.     Marland Kitchen oxybutynin (DITROPAN) 5 MG tablet Take 5 mg by mouth daily.    . pantoprazole (PROTONIX) 40 MG tablet Take 40 mg by mouth daily.    . polyethylene glycol (MIRALAX / GLYCOLAX) packet Take by mouth.    . Probiotic Product (ALIGN) 4 MG CAPS Take 1 capsule by mouth daily.    . prochlorperazine (COMPAZINE) 10 MG tablet TAKE 1 TABLET (10 MG TOTAL) BY MOUTH EVERY 6 (SIX) HOURS AS NEEDED FOR NAUSEA OR VOMITING. (Patient not taking: Reported on 11/15/2016) 30 tablet 0  . PROCTOSOL HC 2.5 % rectal cream INSERT INTO THE RECTUM TWO (2) TIMES A DAY.  0  . saw palmetto 160 MG capsule Take 160 mg by mouth daily.    . Tamsulosin HCl (FLOMAX) 0.4 MG CAPS Take 0.4 mg by mouth daily.    Marland Kitchen triamcinolone cream (KENALOG) 0.1 % Apply topically.     No current facility-administered medications for this visit.    Facility-Administered Medications Ordered in Other Visits  Medication Dose Route Frequency Provider Last Rate Last Dose  . iopamidol (ISOVUE-300) 61 % injection             SURGICAL HISTORY:  Past Surgical History:  Procedure Laterality Date  . CATARACT EXTRACTION     bilateral  . CHOLECYSTECTOMY  06/2003  . INGUINAL HERNIA REPAIR    . SPLENECTOMY  2002  . TRANSURETHRAL RESECTION OF PROSTATE      REVIEW OF SYSTEMS:  A comprehensive review of systems was negative except for: Constitutional: positive for fatigue Ears, nose, mouth, throat, and face: positive for sore mouth   PHYSICAL EXAMINATION: General appearance: alert, cooperative, fatigued and no distress Head: Normocephalic, without obvious abnormality, atraumatic Neck: no adenopathy,  no JVD, supple, symmetrical, trachea midline and thyroid not enlarged, symmetric, no tenderness/mass/nodules Lymph nodes: Cervical, supraclavicular, and axillary nodes normal. Resp: clear to auscultation bilaterally Back: symmetric, no curvature. ROM normal. No CVA tenderness. Cardio: regular rate and rhythm, S1, S2 normal, no murmur, click, rub or gallop GI: soft, non-tender; bowel sounds normal; no masses,  no organomegaly Extremities: extremities normal, atraumatic, no cyanosis or edema  ECOG PERFORMANCE STATUS: 1 - Symptomatic but completely ambulatory  Blood pressure (!) 152/58, pulse 71, temperature 98.4 F (36.9 C), temperature source Oral, resp. rate 18, height 5' 8"  (1.727 m), weight 158 lb 11.2 oz (72 kg), SpO2 99 %.  LABORATORY DATA: Lab Results  Component Value Date   WBC 9.8 02/13/2017   HGB  12.2 (L) 02/13/2017   HCT 35.2 (L) 02/13/2017   MCV 96.7 02/13/2017   PLT 343 02/13/2017      Chemistry      Component Value Date/Time   NA 129 (L) 02/13/2017 1118   K 4.9 02/13/2017 1118   CL 100 (L) 08/30/2015 1150   CO2 27 02/13/2017 1118   BUN 10.8 02/13/2017 1118   CREATININE 0.7 02/13/2017 1118      Component Value Date/Time   CALCIUM 10.2 02/13/2017 1118   ALKPHOS 64 02/13/2017 1118   AST 29 02/13/2017 1118   ALT 23 02/13/2017 1118   BILITOT 0.59 02/13/2017 1118       RADIOGRAPHIC STUDIES: Ct Chest W Contrast  Result Date: 02/15/2017 CLINICAL DATA:  Patient with history of right lung cancer diagnosed 07/2015. Patient on chemotherapy. EXAM: CT CHEST WITH CONTRAST TECHNIQUE: Multidetector CT imaging of the chest was performed during intravenous contrast administration. CONTRAST:  43m ISOVUE-300 IOPAMIDOL (ISOVUE-300) INJECTION 61% COMPARISON:  CT chest 11/13/2016. FINDINGS: Cardiovascular: Right anterior chest wall Port-A-Cath is present. Tip terminates in the superior vena cava. Normal heart size. Small pericardial effusion. Coronary arterial vascular  calcifications. Aortic vascular calcifications. Mediastinum/Nodes: No enlarged axillary, mediastinal or hilar lymphadenopathy. Normal esophagus. Lungs/Pleura: Central airways are patent. Stable 4 mm left upper lobe pulmonary nodule (image 84; series 5). No large area of pulmonary consolidation. No new or enlarging pulmonary nodules. Stable scarring within the right middle lobe. No pleural effusion or pneumothorax. Upper Abdomen: Liver is normal in size and contour. Patient status post cholecystectomy. Fatty atrophy of the pancreatic parenchyma. Prior splenectomy. Normal morphology of the stomach. Bowel and fat containing anterior abdominal wall hernia (image 166; series 2). Musculoskeletal: Thoracic spine degenerative changes. No aggressive or acute appearing osseous lesions. IMPRESSION: No evidence for locally recurrent or metastatic disease. Aortic Atherosclerosis (ICD10-I70.0). Electronically Signed   By: DLovey NewcomerM.D.   On: 02/15/2017 09:29    ASSESSMENT AND PLAN:  This is a very pleasant 83years old white male with stage IIIB non-small cell lung cancer, adenocarcinoma status post induction systemic chemotherapy with carboplatin and Alimta followed by maintenance treatment with single agent Alimta status post 15 cycles and has been tolerating the treatment well except for fatigue and weakness. He is currently on break off chemotherapy and has been observation for the last 3 months. He had repeat CT scan of the chest performed recently. His scan showed no evidence for locally recurrent or metastatic disease. I discussed the scan results with the patient today and recommended for him to continue on observation with repeat CT scan of the chest in 4 months. For the last dose, I gave him a refill of magic mouthwash. The patient was advised to call immediately if he has any concerning symptoms in the interval. The patient voices understanding of current disease status and treatment options and is in  agreement with the current care plan. All questions were answered. The patient knows to call the clinic with any problems, questions or concerns. We can certainly see the patient much sooner if necessary.  Disclaimer: This note was dictated with voice recognition software. Similar sounding words can inadvertently be transcribed and may not be corrected upon review.

## 2017-02-15 NOTE — Telephone Encounter (Signed)
Scheduled appt per 7/5 los patient is aware of appt time and date and sent reminder letter in the mail.

## 2017-02-16 MED FILL — MAGIC MOUTHWASH BOP FORM: 12 days supply | Qty: 240 | Fill #0

## 2017-02-21 NOTE — Telephone Encounter (Signed)
"  I was told I would have my November schedule mailed but have not received it yet."  Returned call to notify patient it takes ten business days to go through the hospital mail department and be mailed for his receipt.  "If I do not receive it by the end of the month, I'll call back."  No further questions.

## 2017-03-29 MED FILL — MAGIC MOUTHWASH BOP FORM: 12 days supply | Qty: 240 | Fill #1

## 2017-04-10 DIAGNOSIS — M48061 Spinal stenosis, lumbar region without neurogenic claudication: Secondary | ICD-10-CM | POA: Diagnosis not present

## 2017-05-02 DIAGNOSIS — Z23 Encounter for immunization: Secondary | ICD-10-CM | POA: Diagnosis not present

## 2017-06-12 DIAGNOSIS — N401 Enlarged prostate with lower urinary tract symptoms: Secondary | ICD-10-CM | POA: Diagnosis not present

## 2017-06-12 DIAGNOSIS — R351 Nocturia: Secondary | ICD-10-CM | POA: Diagnosis not present

## 2017-06-13 ENCOUNTER — Telehealth: Payer: Self-pay

## 2017-06-13 NOTE — Telephone Encounter (Signed)
Spoke with patient concerning his upcoming appointment. Per 10/31 los

## 2017-06-15 ENCOUNTER — Other Ambulatory Visit (HOSPITAL_BASED_OUTPATIENT_CLINIC_OR_DEPARTMENT_OTHER): Payer: PPO

## 2017-06-15 ENCOUNTER — Ambulatory Visit (HOSPITAL_COMMUNITY)
Admission: RE | Admit: 2017-06-15 | Discharge: 2017-06-15 | Disposition: A | Discharge status: Home / Self Care | Payer: PPO | Source: Ambulatory Visit | Attending: Internal Medicine | Admitting: Internal Medicine

## 2017-06-15 DIAGNOSIS — C3491 Malignant neoplasm of unspecified part of right bronchus or lung: Secondary | ICD-10-CM | POA: Diagnosis not present

## 2017-06-15 DIAGNOSIS — C3411 Malignant neoplasm of upper lobe, right bronchus or lung: Secondary | ICD-10-CM | POA: Diagnosis not present

## 2017-06-15 DIAGNOSIS — I700 Atherosclerosis of aorta: Secondary | ICD-10-CM | POA: Insufficient documentation

## 2017-06-15 DIAGNOSIS — Z85118 Personal history of other malignant neoplasm of bronchus and lung: Secondary | ICD-10-CM | POA: Diagnosis not present

## 2017-06-15 LAB — CBC WITH DIFFERENTIAL/PLATELET
BASO%: 0.3 % (ref 0.0–2.0)
BASOS ABS: 0 10*3/uL (ref 0.0–0.1)
EOS ABS: 0.1 10*3/uL (ref 0.0–0.5)
EOS%: 1.8 % (ref 0.0–7.0)
HEMATOCRIT: 35.6 % — AB (ref 38.4–49.9)
HEMOGLOBIN: 12.2 g/dL — AB (ref 13.0–17.1)
LYMPH#: 1.4 10*3/uL (ref 0.9–3.3)
LYMPH%: 18.3 % (ref 14.0–49.0)
MCH: 33.5 pg — AB (ref 27.2–33.4)
MCHC: 34.2 g/dL (ref 32.0–36.0)
MCV: 97.9 fL (ref 79.3–98.0)
MONO#: 1.1 10*3/uL — ABNORMAL HIGH (ref 0.1–0.9)
MONO%: 14.1 % — ABNORMAL HIGH (ref 0.0–14.0)
NEUT%: 65.5 % (ref 39.0–75.0)
NEUTROS ABS: 5.1 10*3/uL (ref 1.5–6.5)
Platelets: 365 10*3/uL (ref 140–400)
RBC: 3.64 10*6/uL — ABNORMAL LOW (ref 4.20–5.82)
RDW: 12.5 % (ref 11.0–14.6)
WBC: 7.8 10*3/uL (ref 4.0–10.3)

## 2017-06-15 LAB — COMPREHENSIVE METABOLIC PANEL
ALBUMIN: 3.7 g/dL (ref 3.5–5.0)
ALT: 22 U/L (ref 0–55)
AST: 23 U/L (ref 5–34)
Alkaline Phosphatase: 65 U/L (ref 40–150)
Anion Gap: 8 mEq/L (ref 3–11)
BUN: 9.8 mg/dL (ref 7.0–26.0)
CHLORIDE: 98 meq/L (ref 98–109)
CO2: 27 meq/L (ref 22–29)
Calcium: 10.2 mg/dL (ref 8.4–10.4)
Creatinine: 0.7 mg/dL (ref 0.7–1.3)
GLUCOSE: 123 mg/dL (ref 70–140)
POTASSIUM: 4.1 meq/L (ref 3.5–5.1)
Sodium: 133 mEq/L — ABNORMAL LOW (ref 136–145)
TOTAL PROTEIN: 6.8 g/dL (ref 6.4–8.3)
Total Bilirubin: 0.64 mg/dL (ref 0.20–1.20)

## 2017-06-15 MED ORDER — IOPAMIDOL (ISOVUE-300) INJECTION 61%
75.0000 mL | Freq: Once | INTRAVENOUS | Status: AC | PRN
  Administered 2017-06-15: 75 mL via INTRAVENOUS

## 2017-06-15 MED ORDER — IOPAMIDOL (ISOVUE-300) INJECTION 61%
INTRAVENOUS | Status: AC
  Filled 2017-06-15: qty 75

## 2017-06-18 ENCOUNTER — Ambulatory Visit (HOSPITAL_BASED_OUTPATIENT_CLINIC_OR_DEPARTMENT_OTHER): Payer: PPO | Admitting: Internal Medicine

## 2017-06-18 ENCOUNTER — Encounter: Payer: Self-pay | Admitting: Internal Medicine

## 2017-06-18 ENCOUNTER — Telehealth: Payer: Self-pay

## 2017-06-18 DIAGNOSIS — Z85118 Personal history of other malignant neoplasm of bronchus and lung: Secondary | ICD-10-CM | POA: Diagnosis not present

## 2017-06-18 DIAGNOSIS — C3490 Malignant neoplasm of unspecified part of unspecified bronchus or lung: Secondary | ICD-10-CM

## 2017-06-18 NOTE — Progress Notes (Signed)
[facility] [phone]   [phone]  OFFICE PROGRESS NOTE  [name], MD [address] [place] [address]  DIAGNOSIS:  Stage IIIB (T1a, N3, M0) non-small cell lung cancer, adenocarcinoma with negative EGFR, ALK, ROS 1 but positive RET mutations presented with right middle lobe lung nodule in addition to mediastinal and left supraclavicular lymphadenopathy diagnosed in November 2016.  PRIOR THERAPY:  1) Systemic chemotherapy with carboplatin for AUC of 5 and Alimta 500 MG/M2 every 3 weeks status post 6 cycles with partial response. 2) Maintenance systemic chemotherapy with single agent Alimta 500 MG/M2 every 3 weeks, status post 15 cycles.  CURRENT THERAPY: Observation.  INTERVAL HISTORY: Sean Cooke 83 y.o. male returns to the clinic today for follow-up visit.  The patient is feeling fine today with no specific complaints except for arthritis.  He denied having any chest pain, shortness breath, cough or hemoptysis.  He denied having any fever or chills.  He has no nausea, vomiting, diarrhea or constipation.  He has no significant weight loss or night sweats.  The patient had repeat CT scan of the chest performed recently and he is here for evaluation and discussion of his discuss results.  MEDICAL HISTORY: Past Medical History:  Diagnosis Date  . Adenomatous colon polyp 05/2003  . Arthritis   . Degenerative joint disease   . Diverticulosis   . Duodenal diverticulum   . Encounter for antineoplastic chemotherapy 07/31/2015  . GERD (gastroesophageal reflux disease)   . Hemorrhoids   . Hiatal hernia   . Lung cancer ([place])   . Neuritis/radiculitis due to displacement of lumbar intervertebral disc   . Pneumonia   . Rib fracture     ALLERGIES:  is allergic to benzonatate; lyrica [pregabalin]; penicillins; pneumococcal vaccine polyvalent; tizanidine; and tramadol.  MEDICATIONS:  Current Outpatient Medications    Medication Sig Dispense Refill  . acetaminophen (TYLENOL) 650 MG CR tablet Take 650 mg by mouth every 6 (six) hours as needed for pain or fever. Reported on 09/22/2015    . antiseptic oral rinse (BIOTENE) LIQD 15 mLs by Mouth Rinse route as needed for dry mouth.    Marland Kitchen aspirin 81 MG tablet Take 81 mg by mouth at bedtime.     . betamethasone dipropionate (DIPROLENE) 0.05 % cream Apply topically.    . Calcium Carb-Cholecalciferol (CALCIUM-VITAMIN D) 500-200 MG-UNIT tablet Take 2 tablets by mouth daily.    . cromolyn (OPTICROM) 4 % ophthalmic solution Place 4 drops into both eyes daily.    Marland Kitchen denosumab (PROLIA) 60 MG/ML SOLN injection Inject 60 mg into the skin every 6 (six) months. Administer in upper arm, thigh, or abdomen    . dexamethasone (DECADRON) 4 MG tablet TAKE 1 TABLET BY MOUTH TWICE A DAY DAY BEFORE DAY OF AND DAY AFTER CHEMOTHERAPY EVERY 3 WEEKS 40 tablet 1  . dextromethorphan-guaiFENesin (ROBITUSSIN-DM) 10-100 MG/5ML liquid Take 10 mLs by mouth every 4 (four) hours as needed for cough. Reported on 03/01/2016    . ferrous sulfate 325 (65 FE) MG tablet Take 325 mg by mouth 2 (two) times daily with a meal.    . folic acid (FOLVITE) 1 MG tablet Take 1 tablet (1 mg total) by mouth daily. 30 tablet 4  . guaiFENesin (MUCINEX) 600 MG 12 hr tablet Take 600 mg by mouth 2 (two) times daily as needed.    . lactobacillus acidophilus & bulgar (LACTINEX) chewable tablet Chew 1 tablet by mouth  2 (two) times daily. Store in [facility] [place]    . lidocaine-prilocaine (EMLA) cream Apply 1 application topically as needed. 30 g 3  . loperamide (IMODIUM) 2 MG capsule Take 2 mg by mouth as needed for diarrhea or loose stools. Reported on 03/01/2016    . loratadine (CLARITIN) 10 MG tablet Take 10 mg by mouth.    . losartan (COZAAR) 100 MG tablet Take 100 mg by mouth daily.    . magic mouthwash SOLN Take 5 mLs by mouth 3 (three) times daily as needed for mouth pain. 240 mL 2  . meloxicam (MOBIC) 7.5 MG tablet Take 7.5 mg  by mouth every other day. Reported on 10/27/2015    . Multiple Vitamin (MULTIVITAMIN) tablet Take 1 tablet by mouth daily.    . Omega-3 Fatty Acids (FISH OIL) 1000 MG CAPS Take 1,000 mg by mouth daily.     Marland Kitchen oxybutynin (DITROPAN) 5 MG tablet Take 5 mg by mouth daily.    . pantoprazole (PROTONIX) 40 MG tablet Take 40 mg by mouth daily.    . polyethylene glycol (MIRALAX / GLYCOLAX) packet Take by mouth.    . Probiotic Product (ALIGN) 4 MG CAPS Take 1 capsule by mouth daily.    . prochlorperazine (COMPAZINE) 10 MG tablet TAKE 1 TABLET (10 MG TOTAL) BY MOUTH EVERY 6 (SIX) HOURS AS NEEDED FOR NAUSEA OR VOMITING. (Patient not taking: Reported on 11/15/2016) 30 tablet 0  . PROCTOSOL HC 2.5 % rectal cream INSERT INTO THE RECTUM TWO (2) TIMES A DAY.  0  . saw palmetto 160 MG capsule Take 160 mg by mouth daily.    . Tamsulosin HCl (FLOMAX) 0.4 MG CAPS Take 0.4 mg by mouth daily.    Marland Kitchen triamcinolone cream (KENALOG) 0.1 % Apply topically.     No current facility-administered medications for this visit.     SURGICAL HISTORY:  Past Surgical History:  Procedure Laterality Date  . CATARACT EXTRACTION     bilateral  . CHOLECYSTECTOMY  06/2003  . INGUINAL HERNIA REPAIR    . SPLENECTOMY  2002  . TRANSURETHRAL RESECTION OF PROSTATE      REVIEW OF SYSTEMS:  A comprehensive review of systems was negative except for: Musculoskeletal: positive for arthralgias   PHYSICAL EXAMINATION: General appearance: alert, cooperative and no distress Head: Normocephalic, without obvious abnormality, atraumatic Neck: no adenopathy, no JVD, supple, symmetrical, trachea midline and thyroid not enlarged, symmetric, no tenderness/mass/nodules Lymph nodes: Cervical, supraclavicular, and axillary nodes normal. Resp: clear to auscultation bilaterally Back: symmetric, no curvature. ROM normal. No CVA tenderness. Cardio: regular rate and rhythm, S1, S2 normal, no murmur, click, rub or gallop GI: soft, non-tender; bowel sounds  normal; no masses,  no organomegaly Extremities: extremities normal, atraumatic, no cyanosis or edema  ECOG PERFORMANCE STATUS: 1 - Symptomatic but completely ambulatory  Blood pressure 136/67, pulse 72, temperature 97.8 F (36.6 C), temperature source Oral, resp. rate 18, height 5' 8"  (1.727 m), weight 156 lb 6.4 oz (70.9 kg), SpO2 99 %.  LABORATORY DATA: Lab Results  Component Value Date   WBC 7.8 06/15/2017   HGB 12.2 (L) 06/15/2017   HCT 35.6 (L) 06/15/2017   MCV 97.9 06/15/2017   PLT 365 06/15/2017      Chemistry      Component Value Date/Time   NA 133 (L) 06/15/2017 0816   K 4.1 06/15/2017 0816   CL 100 (L) 08/30/2015 1150   CO2 27 06/15/2017 0816   BUN 9.8 06/15/2017 0816   CREATININE  0.7 06/15/2017 0816      Component Value Date/Time   CALCIUM 10.2 06/15/2017 0816   ALKPHOS 65 06/15/2017 0816   AST 23 06/15/2017 0816   ALT 22 06/15/2017 0816   BILITOT 0.64 06/15/2017 0816       RADIOGRAPHIC STUDIES: Ct Chest W Contrast  Result Date: 06/15/2017 CLINICAL DATA:  Patient with history of right lung cancer diagnosed 07/2015. EXAM: CT CHEST WITH CONTRAST TECHNIQUE: Multidetector CT imaging of the chest was performed during intravenous contrast administration. CONTRAST:  71m ISOVUE-300 IOPAMIDOL (ISOVUE-300) INJECTION 61% COMPARISON:  CT chest 02/15/2017. FINDINGS: Cardiovascular: Moderate pericardial effusion. Right anterior chest wall Port-A-Cath is present with tip terminating in the superior vena cava. Coronary arterial vascular calcifications. Thoracic aortic vascular calcifications. Mediastinum/Nodes: No enlarged axillary, mediastinal or hilar lymphadenopathy. Normal appearance of the esophagus. Lungs/Pleura: Central airways are patent. Stable 4 mm nodule within the lingula (image 87; series 5). No large area of pulmonary consolidation. No new or enlarging pulmonary nodules. No pleural effusion or pneumothorax. Unchanged scarring in the right middle lobe. Upper  Abdomen: Liver is normal in size and contour. Patient status post cholecystectomy. Multiple splenules within the left upper quadrant. Stable appearance of the adrenal glands. Small anterior abdominal wall hernia, fat containing. Musculoskeletal: Thoracic spine degenerative changes. No aggressive or acute appearing osseous lesions. IMPRESSION: 1. No evidence for locally recurrent or metastatic disease. 2. Aortic Atherosclerosis (ICD10-I70.0). Electronically Signed   By: DLovey NewcomerM.D.   On: 06/15/2017 14:15    ASSESSMENT AND PLAN:  This is a very pleasant 83years old white male with stage IIIB non-small cell lung cancer, adenocarcinoma status post induction systemic chemotherapy with carboplatin and Alimta followed by maintenance treatment with single agent Alimta status post 15 cycles and has been tolerating the treatment well except for fatigue and weakness. The patient has been on observation for the last 7 months and he is feeling fine. Repeat CT scan of the chest performed recently showed no evidence of locally recurrent or metastatic disease. I discussed the scan results with the patient today.  I recommended for him to continue on observation with a repeat CT scan of the chest in 4 months. He was advised to call immediately if he has any concerning symptoms in the interval. The patient voices understanding of current disease status and treatment options and is in agreement with the current care plan. All questions were answered. The patient knows to call the clinic with any problems, questions or concerns. We can certainly see the patient much sooner if necessary.  Disclaimer: This note was dictated with voice recognition software. Similar sounding words can inadvertently be transcribed and may not be corrected upon review.

## 2017-06-18 NOTE — Telephone Encounter (Signed)
Printed avs and calender for upcoming appointment per 11/5 los. Called RAD. And had CT appointment setup also, same day as labs.

## 2017-06-23 IMAGING — US IR US GUIDE VASC ACCESS RIGHT
1 series · 1 of 1 positions shown · non-contrast
Comparison: none

CLINICAL DATA: Metastatic lung carcinoma and need for porta cath
for continued chemotherapy administration.

[Series 1: ir fluoro/shunt/fist · 1 of 1 slices shown]
[im 1/1]
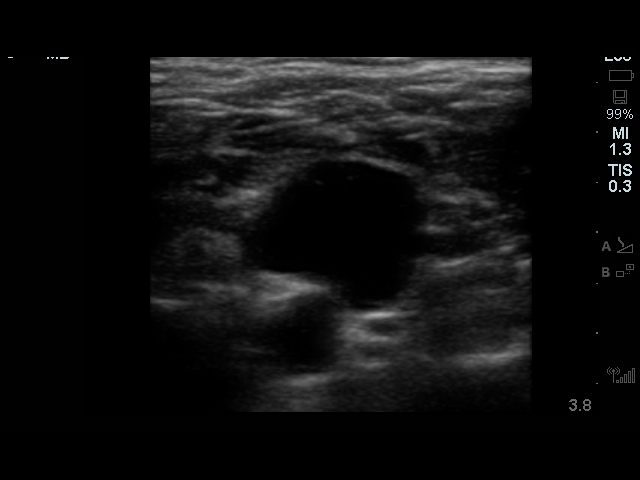

[1 of 1 positions shown; findings below may reference images not displayed]

EXAM:
IMPLANTED PORT A CATH PLACEMENT WITH ULTRASOUND AND FLUOROSCOPIC
GUIDANCE

ANESTHESIA/SEDATION:
1.0 Mg IV Versed; 25 mcg IV Fentanyl

Total Moderate Sedation Time: 30 minutes. The patient was monitored
continuously during the procedure by Radiology nursing.

Additional Medications: 900 mg IV clindamycin. IV antibiotic was
given in a appropriate time interval prior to skin puncture.

FLUOROSCOPY TIME:  24 seconds.

PROCEDURE:
The procedure, risks, benefits, and alternatives were explained to
the patient. Questions regarding the procedure were encouraged and
answered. The patient understands and consents to the procedure.

The right neck and chest were prepped with chlorhexidine in a
sterile fashion, and a sterile drape was applied covering the
operative field. Maximum barrier sterile technique with sterile
gowns and gloves were used for the procedure. Local anesthesia was
provided with 1% lidocaine.

After creating a small venotomy incision, a 21 gauge needle was
advanced into the right internal jugular vein under direct,
real-time ultrasound guidance. Ultrasound image documentation was
performed. After securing guidewire access, an 8 Fr dilator was
placed. A J-wire was kinked to measure appropriate catheter length.

A subcutaneous port pocket was then created along the upper chest
wall utilizing sharp and blunt dissection. Portable cautery was
utilized. The pocket was irrigated with sterile saline.

A single lumen power injectable port was chosen for placement. The 8
Fr catheter was tunneled from the port pocket site to the venotomy
incision. The port was placed in the pocket. External catheter was
trimmed to appropriate length based on guidewire measurement.

At the venotomy, an 8 Fr peel-away sheath was placed over a
guidewire. The catheter was then placed through the sheath and the
sheath removed. Final catheter positioning was confirmed and
documented with a fluoroscopic spot image. The port was accessed
with a needle and aspirated and flushed with heparinized saline. The
needle was removed.

The venotomy and port pocket incisions were closed with subcutaneous
3-0 Monocryl and subcuticular 4-0 Vicryl. Dermabond was applied to
both incisions.

COMPLICATIONS:
None
FINDINGS: After catheter placement, the tip lies at the cavoatrial junction.
The catheter aspirates normally and is ready for immediate use.
IMPRESSION: Placement of single lumen port a cath via right internal jugular
vein. The catheter tip lies at the cavoatrial junction. A power
injectable port a cath was placed and is ready for immediate use.

## 2017-06-26 MED FILL — MAGIC MOUTHWASH BOP FORM: 12 days supply | Qty: 240 | Fill #2

## 2017-07-03 ENCOUNTER — Other Ambulatory Visit: Payer: Self-pay

## 2017-07-03 DIAGNOSIS — E871 Hypo-osmolality and hyponatremia: Secondary | ICD-10-CM | POA: Diagnosis not present

## 2017-07-03 DIAGNOSIS — M549 Dorsalgia, unspecified: Secondary | ICD-10-CM | POA: Diagnosis not present

## 2017-07-03 DIAGNOSIS — I10 Essential (primary) hypertension: Secondary | ICD-10-CM | POA: Diagnosis not present

## 2017-07-03 DIAGNOSIS — C3490 Malignant neoplasm of unspecified part of unspecified bronchus or lung: Secondary | ICD-10-CM | POA: Diagnosis not present

## 2017-07-03 NOTE — Patient Outreach (Signed)
[facility]) Care Management  07/03/2017  [facility] TOME [birth year] [id number]  Referral Date: Referral Source: Referral Reason: Fall risk and referral for hearing specialist.   Outreach Attempt: #1 Spoke with patient he is able to verify HIPAA.  Patient reports he is in good health and saw physician earlier today.  Discussed with patient reason for call and [facility].  Patient reports he has not had any falls in the last 3 months and does say that he has been told he needed hearing aids.  Patient asked about a couple of centers being in network.  Able to look information up for patient and [facility] and [facility] are both in network.  Patient satisfied with information given.  Patient declines care management services but is in agreement to receive information.     Social: Patient lives with spouse and is independent with all aspects of care.   Conditions: Lung Cancer-remission, HTN, and GERD   Medications: Patient takes medications as ordered and denies any problems.   Appointments:  Patient saw primary physician today   Advanced Directives: Patient has advanced directives   Consent: Patient declines services at this time.   Plan: RN CM will close case at this time and notify care management assistant.    Jone Baseman, RN, MSN [facility] Care Management Care Management Coordinator Direct Line [phone] Toll Free: [phone]  Fax: [phone]

## 2017-07-10 DIAGNOSIS — R739 Hyperglycemia, unspecified: Secondary | ICD-10-CM | POA: Diagnosis not present

## 2017-07-12 DIAGNOSIS — E559 Vitamin D deficiency, unspecified: Secondary | ICD-10-CM | POA: Diagnosis not present

## 2017-07-12 DIAGNOSIS — M810 Age-related osteoporosis without current pathological fracture: Secondary | ICD-10-CM | POA: Diagnosis not present

## 2017-07-12 DIAGNOSIS — E871 Hypo-osmolality and hyponatremia: Secondary | ICD-10-CM | POA: Diagnosis not present

## 2017-07-17 DIAGNOSIS — Z961 Presence of intraocular lens: Secondary | ICD-10-CM | POA: Diagnosis not present

## 2017-07-17 DIAGNOSIS — H10413 Chronic giant papillary conjunctivitis, bilateral: Secondary | ICD-10-CM | POA: Diagnosis not present

## 2017-07-17 DIAGNOSIS — H04123 Dry eye syndrome of bilateral lacrimal glands: Secondary | ICD-10-CM | POA: Diagnosis not present

## 2017-07-17 DIAGNOSIS — H16143 Punctate keratitis, bilateral: Secondary | ICD-10-CM | POA: Diagnosis not present

## 2017-07-18 MED FILL — MAGIC MOUTHWASH BOP FORM: 16 days supply | Qty: 240 | Fill #0

## 2017-07-30 ENCOUNTER — Telehealth: Payer: Self-pay

## 2017-07-30 NOTE — Telephone Encounter (Signed)
Called to [place] at Dr Tye Savoy office.

## 2017-07-30 NOTE — Telephone Encounter (Signed)
OK to proceed. He is off treatment.

## 2017-07-30 NOTE — Telephone Encounter (Signed)
Dentist Sean Cooke, pt is needing clearance for either an extraction or root canal. Last alimta 10/25/16.  CT 10/15/17, MD 10/17/17.

## 2017-08-10 DIAGNOSIS — M810 Age-related osteoporosis without current pathological fracture: Secondary | ICD-10-CM | POA: Diagnosis not present

## 2017-08-17 DIAGNOSIS — M5136 Other intervertebral disc degeneration, lumbar region: Secondary | ICD-10-CM | POA: Diagnosis not present

## 2017-08-29 DIAGNOSIS — M5136 Other intervertebral disc degeneration, lumbar region: Secondary | ICD-10-CM | POA: Diagnosis not present

## 2017-08-30 ENCOUNTER — Other Ambulatory Visit: Payer: Self-pay | Admitting: Medical Oncology

## 2017-08-30 ENCOUNTER — Telehealth: Payer: Self-pay | Admitting: Medical Oncology

## 2017-08-30 DIAGNOSIS — K121 Other forms of stomatitis: Secondary | ICD-10-CM

## 2017-08-30 NOTE — Telephone Encounter (Signed)
I instructed pt to contact dentist or PCP if he is having dental , mouth problems. Sean Cooke will not refill MMW because pt is on observation for his cancer.

## 2017-09-04 DIAGNOSIS — M5136 Other intervertebral disc degeneration, lumbar region: Secondary | ICD-10-CM | POA: Diagnosis not present

## 2017-09-21 DIAGNOSIS — M5136 Other intervertebral disc degeneration, lumbar region: Secondary | ICD-10-CM | POA: Diagnosis not present

## 2017-10-01 MED FILL — MAGIC MOUTHWASH BOP FORM: 16 days supply | Qty: 240 | Fill #0

## 2017-10-15 ENCOUNTER — Ambulatory Visit (HOSPITAL_COMMUNITY)
Admission: RE | Admit: 2017-10-15 | Discharge: 2017-10-15 | Disposition: A | Discharge status: Home / Self Care | Payer: PPO | Source: Ambulatory Visit | Attending: Internal Medicine | Admitting: Internal Medicine

## 2017-10-15 ENCOUNTER — Inpatient Hospital Stay: Payer: PPO | Attending: Internal Medicine

## 2017-10-15 DIAGNOSIS — C3490 Malignant neoplasm of unspecified part of unspecified bronchus or lung: Secondary | ICD-10-CM | POA: Insufficient documentation

## 2017-10-15 DIAGNOSIS — Z9221 Personal history of antineoplastic chemotherapy: Secondary | ICD-10-CM | POA: Diagnosis not present

## 2017-10-15 DIAGNOSIS — J439 Emphysema, unspecified: Secondary | ICD-10-CM | POA: Diagnosis not present

## 2017-10-15 DIAGNOSIS — N62 Hypertrophy of breast: Secondary | ICD-10-CM | POA: Insufficient documentation

## 2017-10-15 DIAGNOSIS — I2510 Atherosclerotic heart disease of native coronary artery without angina pectoris: Secondary | ICD-10-CM | POA: Diagnosis not present

## 2017-10-15 DIAGNOSIS — I700 Atherosclerosis of aorta: Secondary | ICD-10-CM | POA: Insufficient documentation

## 2017-10-15 LAB — CBC WITH DIFFERENTIAL/PLATELET
BASOS PCT: 1 %
Basophils Absolute: 0 10*3/uL (ref 0.0–0.1)
Eosinophils Absolute: 0.2 10*3/uL (ref 0.0–0.5)
Eosinophils Relative: 3 %
HEMATOCRIT: 35 % — AB (ref 38.4–49.9)
HEMOGLOBIN: 11.8 g/dL — AB (ref 13.0–17.1)
LYMPHS PCT: 18 %
Lymphs Abs: 1.5 10*3/uL (ref 0.9–3.3)
MCH: 33.7 pg — ABNORMAL HIGH (ref 27.2–33.4)
MCHC: 33.7 g/dL (ref 32.0–36.0)
MCV: 99.9 fL — ABNORMAL HIGH (ref 79.3–98.0)
Monocytes Absolute: 1.4 10*3/uL — ABNORMAL HIGH (ref 0.1–0.9)
Monocytes Relative: 16 %
NEUTROS ABS: 5.4 10*3/uL (ref 1.5–6.5)
NEUTROS PCT: 62 %
Platelets: 388 10*3/uL (ref 140–400)
RBC: 3.5 MIL/uL — ABNORMAL LOW (ref 4.20–5.82)
RDW: 13.6 % (ref 11.0–14.6)
WBC: 8.5 10*3/uL (ref 4.0–10.3)

## 2017-10-15 LAB — COMPREHENSIVE METABOLIC PANEL
ALK PHOS: 60 U/L (ref 40–150)
ALT: 26 U/L (ref 0–55)
ANION GAP: 8 (ref 3–11)
AST: 25 U/L (ref 5–34)
Albumin: 3.7 g/dL (ref 3.5–5.0)
BUN: 11 mg/dL (ref 7–26)
CALCIUM: 10 mg/dL (ref 8.4–10.4)
CHLORIDE: 95 mmol/L — AB (ref 98–109)
CO2: 26 mmol/L (ref 22–29)
Creatinine, Ser: 0.71 mg/dL (ref 0.70–1.30)
GFR calc non Af Amer: >60 mL/min (ref >60)
Glucose, Bld: 110 mg/dL (ref 70–140)
POTASSIUM: 4.5 mmol/L (ref 3.5–5.1)
SODIUM: 129 mmol/L — AB (ref 136–145)
Total Bilirubin: 0.6 mg/dL (ref 0.2–1.2)
Total Protein: 6.5 g/dL (ref 6.4–8.3)

## 2017-10-15 MED ORDER — SODIUM CHLORIDE 0.9 % IJ SOLN
INTRAMUSCULAR | Status: AC
  Filled 2017-10-15: qty 50

## 2017-10-15 MED ORDER — IOPAMIDOL (ISOVUE-300) INJECTION 61%
INTRAVENOUS | Status: AC
  Filled 2017-10-15: qty 75

## 2017-10-15 MED ORDER — IOPAMIDOL (ISOVUE-300) INJECTION 61%
75.0000 mL | Freq: Once | INTRAVENOUS | Status: AC | PRN
  Administered 2017-10-15: 75 mL via INTRAVENOUS

## 2017-10-17 ENCOUNTER — Telehealth: Payer: Self-pay

## 2017-10-17 ENCOUNTER — Inpatient Hospital Stay (HOSPITAL_BASED_OUTPATIENT_CLINIC_OR_DEPARTMENT_OTHER): Payer: PPO | Admitting: Internal Medicine

## 2017-10-17 ENCOUNTER — Encounter: Payer: Self-pay | Admitting: Internal Medicine

## 2017-10-17 DIAGNOSIS — C3490 Malignant neoplasm of unspecified part of unspecified bronchus or lung: Secondary | ICD-10-CM | POA: Diagnosis not present

## 2017-10-17 DIAGNOSIS — Z9221 Personal history of antineoplastic chemotherapy: Secondary | ICD-10-CM

## 2017-10-17 NOTE — Telephone Encounter (Signed)
Printed avs and calender of upcoming appointment. Per 3/6 los

## 2017-10-17 NOTE — Progress Notes (Signed)
[facility] [phone]   [phone]  OFFICE PROGRESS NOTE  [name], MD No address on file  DIAGNOSIS:  Stage IIIB (T1a, N3, M0) non-small cell lung cancer, adenocarcinoma with negative EGFR, ALK, ROS 1 but positive RET mutations presented with right middle lobe lung nodule in addition to mediastinal and left supraclavicular lymphadenopathy diagnosed in November 2016.  PRIOR THERAPY:  1) Systemic chemotherapy with carboplatin for AUC of 5 and Alimta 500 MG/M2 every 3 weeks status post 6 cycles with partial response. 2) Maintenance systemic chemotherapy with single agent Alimta 500 MG/M2 every 3 weeks, status post 15 cycles.  CURRENT THERAPY: Observation.  INTERVAL HISTORY: Sean Cooke 84 y.o. male returns to the clinic today for follow-up visit accompanied by his wife.  The patient is feeling fine with no specific complaints except for occasional back pain.  He had a fall a month ago and hurt his back.  He was seen by orthopedic surgery and managed by observation.  He denied having any chest pain, shortness breath, cough or hemoptysis.  He denied having any nausea, vomiting, abdominal pain, diarrhea or constipation.  He has no recent weight loss or night sweats.  Is here today for evaluation with repeat CT scan of the chest.  MEDICAL HISTORY: Past Medical History:  Diagnosis Date  . Adenomatous colon polyp 05/2003  . Arthritis   . Degenerative joint disease   . Diverticulosis   . Duodenal diverticulum   . Encounter for antineoplastic chemotherapy 07/31/2015  . GERD (gastroesophageal reflux disease)   . Hemorrhoids   . Hiatal hernia   . Lung cancer ([place])   . Neuritis/radiculitis due to displacement of lumbar intervertebral disc   . Pneumonia   . Rib fracture     ALLERGIES:  is allergic to benzonatate; lyrica [pregabalin]; penicillins; pneumococcal vaccine polyvalent; tizanidine; and tramadol.  MEDICATIONS:  Current  Outpatient Medications  Medication Sig Dispense Refill  . acetaminophen (TYLENOL) 650 MG CR tablet Take 650 mg by mouth every 6 (six) hours as needed for pain or fever. Reported on 09/22/2015    . antiseptic oral rinse (BIOTENE) LIQD 15 mLs by Mouth Rinse route as needed for dry mouth.    Marland Kitchen aspirin 81 MG tablet Take 81 mg by mouth at bedtime.     . Calcium Carb-Cholecalciferol (CALCIUM-VITAMIN D) 500-200 MG-UNIT tablet Take 2 tablets by mouth daily.    . cromolyn (OPTICROM) 4 % ophthalmic solution Place 4 drops into both eyes daily.    Marland Kitchen denosumab (PROLIA) 60 MG/ML SOLN injection Inject 60 mg into the skin every 6 (six) months. Administer in upper arm, thigh, or abdomen    . dexamethasone (DECADRON) 4 MG tablet TAKE 1 TABLET BY MOUTH TWICE A DAY DAY BEFORE DAY OF AND DAY AFTER CHEMOTHERAPY EVERY 3 WEEKS 40 tablet 1  . dextromethorphan-guaiFENesin (ROBITUSSIN-DM) 10-100 MG/5ML liquid Take 10 mLs by mouth every 4 (four) hours as needed for cough. Reported on 03/01/2016    . ferrous sulfate 325 (65 FE) MG tablet Take 325 mg by mouth 2 (two) times daily with a meal.    . folic acid (FOLVITE) 1 MG tablet Take 1 tablet (1 mg total) by mouth daily. 30 tablet 4  . guaiFENesin (MUCINEX) 600 MG 12 hr tablet Take 600 mg by mouth 2 (two) times daily as needed.    . hydrocortisone (CORTEF) 20 MG tablet     . lactobacillus acidophilus & bulgar (LACTINEX) chewable tablet Chew  1 tablet by mouth 2 (two) times daily. Store in [place], [place]    . lidocaine-prilocaine (EMLA) cream Apply 1 application topically as needed. 30 g 3  . loperamide (IMODIUM) 2 MG capsule Take 2 mg by mouth as needed for diarrhea or loose stools. Reported on 03/01/2016    . loratadine (CLARITIN) 10 MG tablet Take 10 mg by mouth.    . losartan (COZAAR) 100 MG tablet Take 100 mg by mouth daily.    . magic mouthwash SOLN Take 5 mLs by mouth 3 (three) times daily as needed for mouth pain. 240 mL 2  . meloxicam (MOBIC) 7.5 MG tablet Take 7.5 mg by  mouth every other day. Reported on 10/27/2015    . Multiple Vitamin (MULTIVITAMIN) tablet Take 1 tablet by mouth daily.    . Omega-3 Fatty Acids (FISH OIL) 1000 MG CAPS Take 1,000 mg by mouth daily.     Marland Kitchen oxybutynin (DITROPAN) 5 MG tablet Take 5 mg by mouth daily.    . pantoprazole (PROTONIX) 40 MG tablet Take 40 mg by mouth daily.    . polyethylene glycol (MIRALAX / GLYCOLAX) packet Take by mouth.    . Probiotic Product (ALIGN) 4 MG CAPS Take 1 capsule by mouth daily.    . prochlorperazine (COMPAZINE) 10 MG tablet TAKE 1 TABLET (10 MG TOTAL) BY MOUTH EVERY 6 (SIX) HOURS AS NEEDED FOR NAUSEA OR VOMITING. 30 tablet 0  . PROCTOSOL HC 2.5 % rectal cream INSERT INTO THE RECTUM TWO (2) TIMES A DAY.  0  . saw palmetto 160 MG capsule Take 160 mg by mouth daily.    . Tamsulosin HCl (FLOMAX) 0.4 MG CAPS Take 0.4 mg by mouth daily.    Marland Kitchen triamcinolone cream (KENALOG) 0.1 % Apply topically.     No current facility-administered medications for this visit.     SURGICAL HISTORY:  Past Surgical History:  Procedure Laterality Date  . CATARACT EXTRACTION     bilateral  . CHOLECYSTECTOMY  06/2003  . INGUINAL HERNIA REPAIR    . SPLENECTOMY  2002  . TRANSURETHRAL RESECTION OF PROSTATE      REVIEW OF SYSTEMS:  A comprehensive review of systems was negative except for: Musculoskeletal: positive for back pain   PHYSICAL EXAMINATION: General appearance: alert, cooperative and no distress Head: Normocephalic, without obvious abnormality, atraumatic Neck: no adenopathy, no JVD, supple, symmetrical, trachea midline and thyroid not enlarged, symmetric, no tenderness/mass/nodules Lymph nodes: Cervical, supraclavicular, and axillary nodes normal. Resp: clear to auscultation bilaterally Back: symmetric, no curvature. ROM normal. No CVA tenderness. Cardio: regular rate and rhythm, S1, S2 normal, no murmur, click, rub or gallop GI: soft, non-tender; bowel sounds normal; no masses,  no organomegaly Extremities:  extremities normal, atraumatic, no cyanosis or edema  ECOG PERFORMANCE STATUS: 1 - Symptomatic but completely ambulatory  Blood pressure (!) 166/68, pulse 69, temperature 98.5 F (36.9 C), temperature source Oral, resp. rate 20, height 5' 8"  (1.727 m), weight 158 lb 12.8 oz (72 kg), SpO2 100 %.  LABORATORY DATA: Lab Results  Component Value Date   WBC 8.5 10/15/2017   HGB 11.8 (L) 10/15/2017   HCT 35.0 (L) 10/15/2017   MCV 99.9 (H) 10/15/2017   PLT 388 10/15/2017      Chemistry      Component Value Date/Time   NA 129 (L) 10/15/2017 0733   NA 133 (L) 06/15/2017 0816   K 4.5 10/15/2017 0733   K 4.1 06/15/2017 0816   CL 95 (L) 10/15/2017 7124  CO2 26 10/15/2017 0733   CO2 27 06/15/2017 0816   BUN 11 10/15/2017 0733   BUN 9.8 06/15/2017 0816   CREATININE 0.71 10/15/2017 0733   CREATININE 0.7 06/15/2017 0816      Component Value Date/Time   CALCIUM 10.0 10/15/2017 0733   CALCIUM 10.2 06/15/2017 0816   ALKPHOS 60 10/15/2017 0733   ALKPHOS 65 06/15/2017 0816   AST 25 10/15/2017 0733   AST 23 06/15/2017 0816   ALT 26 10/15/2017 0733   ALT 22 06/15/2017 0816   BILITOT 0.6 10/15/2017 0733   BILITOT 0.64 06/15/2017 0816       RADIOGRAPHIC STUDIES: Ct Chest W Contrast  Result Date: 10/15/2017 CLINICAL DATA:  Non-small-cell lung cancer. Diagnosed 12/16 with last chemotherapy in June. EXAM: CT CHEST WITH CONTRAST TECHNIQUE: Multidetector CT imaging of the chest was performed during intravenous contrast administration. CONTRAST:  26m ISOVUE-300 IOPAMIDOL (ISOVUE-300) INJECTION 61% COMPARISON:  06/15/2017 FINDINGS: Cardiovascular: Right Port-A-Cath which terminates at the low SVC. Bovine arch. Aortic atherosclerosis. Normal heart size with slight increase in small pericardial effusion. Multivessel coronary artery atherosclerosis. Mild pulmonary artery enlargement, including a 2.9 cm right pulmonary artery. No central pulmonary embolism, on this non-dedicated study.  Mediastinum/Nodes: No supraclavicular adenopathy. No mediastinal or hilar adenopathy. Lungs/Pleura: No pleural fluid.  Patent airways. Mild centrilobular emphysema. Similar appearance of right middle lobe scarring. Developing scarring in the anterior left upper lobe. 4 mm lingular nodule is unchanged on image 92/5. Upper Abdomen: Cholecystectomy. Splenectomy with left upper quadrant splenosis. Proximal gastric underdistention. Heterogeneous fatty atrophy/replacement throughout the pancreas. Especially when compared to 10/11/2015, there is increased soft tissue density within the pancreatic body including on image 151/2. Normal imaged portions of the adrenal glands, kidneys. Fat containing ventral abdominal wall hernia is eccentric left. Musculoskeletal: Right greater than left gynecomastia. Right L2 vertebral hemangioma IMPRESSION: 1.  No acute process or evidence of metastatic disease in the chest. 2. Aortic atherosclerosis (ICD10-I70.0), coronary artery atherosclerosis and emphysema (ICD10-J43.9). 3. Pulmonary artery enlargement suggests pulmonary arterial hypertension. 4. Heterogeneous fatty replacement/atrophy throughout the pancreas. Possible developing area of soft tissue density/mass within the pancreatic body. More definitive characterization could be performed with dedicated pre and post contrast abdominal MRI. If that is not practical in this 84year old patient, recommend attention on follow-up. 5. Gynecomastia. Electronically Signed   By: KAbigail MiyamotoM.D.   On: 10/15/2017 09:55    ASSESSMENT AND PLAN:  This is a very pleasant 84years old white male with stage IIIB non-small cell lung cancer, adenocarcinoma status post induction systemic chemotherapy with carboplatin and Alimta followed by maintenance treatment with single agent Alimta status post 15 cycles and has been tolerating the treatment well except for fatigue and weakness. The patient has been on observation for the last 10 months and he  is feeling fine. The patient had repeat CT scan of the chest performed recently.  His a scan showed no evidence for disease progression or recurrence.  The scan also showed heterogeneous fatty replacement throughout the pancreas.  I discussed this abnormality with the patient.  I discussed with him proceeding with MRI of the pancreas versus continuous monitoring and observation.  The patient would like to continue in observation for now and will have repeat CT scan of the chest in 3 months for reevaluation. He was advised to call immediately if he has any concerning symptoms in the interval. The patient voices understanding of current disease status and treatment options and is in agreement with the current care  plan. All questions were answered. The patient knows to call the clinic with any problems, questions or concerns. We can certainly see the patient much sooner if necessary.  Disclaimer: This note was dictated with voice recognition software. Similar sounding words can inadvertently be transcribed and may not be corrected upon review.

## 2017-10-17 NOTE — Addendum Note (Signed)
Addended by: Ardeen Garland on: 10/17/2017 09:55 AM   Modules accepted: Orders

## 2017-10-19 DIAGNOSIS — M5136 Other intervertebral disc degeneration, lumbar region: Secondary | ICD-10-CM | POA: Diagnosis not present

## 2017-11-07 ENCOUNTER — Emergency Department (HOSPITAL_COMMUNITY): Payer: PPO

## 2017-11-07 ENCOUNTER — Other Ambulatory Visit: Payer: Self-pay

## 2017-11-07 ENCOUNTER — Encounter (HOSPITAL_COMMUNITY): Payer: Self-pay | Admitting: Emergency Medicine

## 2017-11-07 ENCOUNTER — Emergency Department (HOSPITAL_COMMUNITY)
Admission: EM | Admit: 2017-11-07 | Discharge: 2017-11-07 | Disposition: A | Discharge status: Home / Self Care | Payer: PPO | Attending: Emergency Medicine | Admitting: Emergency Medicine

## 2017-11-07 DIAGNOSIS — S0990XA Unspecified injury of head, initial encounter: Secondary | ICD-10-CM | POA: Diagnosis not present

## 2017-11-07 DIAGNOSIS — Y9389 Activity, other specified: Secondary | ICD-10-CM | POA: Insufficient documentation

## 2017-11-07 DIAGNOSIS — Z7982 Long term (current) use of aspirin: Secondary | ICD-10-CM | POA: Diagnosis not present

## 2017-11-07 DIAGNOSIS — S61412A Laceration without foreign body of left hand, initial encounter: Secondary | ICD-10-CM | POA: Diagnosis not present

## 2017-11-07 DIAGNOSIS — Z23 Encounter for immunization: Secondary | ICD-10-CM | POA: Insufficient documentation

## 2017-11-07 DIAGNOSIS — Y998 Other external cause status: Secondary | ICD-10-CM | POA: Diagnosis not present

## 2017-11-07 DIAGNOSIS — S0083XA Contusion of other part of head, initial encounter: Secondary | ICD-10-CM | POA: Diagnosis not present

## 2017-11-07 DIAGNOSIS — I10 Essential (primary) hypertension: Secondary | ICD-10-CM | POA: Insufficient documentation

## 2017-11-07 DIAGNOSIS — S6992XA Unspecified injury of left wrist, hand and finger(s), initial encounter: Secondary | ICD-10-CM | POA: Diagnosis not present

## 2017-11-07 DIAGNOSIS — M79642 Pain in left hand: Secondary | ICD-10-CM | POA: Diagnosis not present

## 2017-11-07 DIAGNOSIS — S61419A Laceration without foreign body of unspecified hand, initial encounter: Secondary | ICD-10-CM | POA: Diagnosis not present

## 2017-11-07 DIAGNOSIS — Z85118 Personal history of other malignant neoplasm of bronchus and lung: Secondary | ICD-10-CM | POA: Diagnosis not present

## 2017-11-07 DIAGNOSIS — W108XXA Fall (on) (from) other stairs and steps, initial encounter: Secondary | ICD-10-CM | POA: Insufficient documentation

## 2017-11-07 DIAGNOSIS — Z87891 Personal history of nicotine dependence: Secondary | ICD-10-CM | POA: Diagnosis not present

## 2017-11-07 DIAGNOSIS — Z9221 Personal history of antineoplastic chemotherapy: Secondary | ICD-10-CM | POA: Insufficient documentation

## 2017-11-07 DIAGNOSIS — S6991XA Unspecified injury of right wrist, hand and finger(s), initial encounter: Secondary | ICD-10-CM | POA: Diagnosis not present

## 2017-11-07 DIAGNOSIS — S0081XA Abrasion of other part of head, initial encounter: Secondary | ICD-10-CM | POA: Diagnosis not present

## 2017-11-07 DIAGNOSIS — M79641 Pain in right hand: Secondary | ICD-10-CM | POA: Diagnosis not present

## 2017-11-07 DIAGNOSIS — Y92008 Other place in unspecified non-institutional (private) residence as the place of occurrence of the external cause: Secondary | ICD-10-CM | POA: Insufficient documentation

## 2017-11-07 DIAGNOSIS — Z79899 Other long term (current) drug therapy: Secondary | ICD-10-CM | POA: Diagnosis not present

## 2017-11-07 DIAGNOSIS — W19XXXA Unspecified fall, initial encounter: Secondary | ICD-10-CM

## 2017-11-07 MED ORDER — TETANUS-DIPHTH-ACELL PERTUSSIS 5-2.5-18.5 LF-MCG/0.5 IM SUSP
0.5000 mL | Freq: Once | INTRAMUSCULAR | Status: AC
  Administered 2017-11-07: 0.5 mL via INTRAMUSCULAR
  Filled 2017-11-07: qty 0.5

## 2017-11-07 NOTE — Discharge Instructions (Signed)
Your evaluated in the emergency department after a fall in which she had skin tears to the left hand and multiple abrasions.  You had a CAT scan that did not show any obvious brain injury and x-rays of your hands that did not show any obvious fractures.  We recommend soap and water for your wounds, bacitracin.  Tylenol for pain.  Watch for any signs of infection.  Follow-up with your doctor and return to the emergency department if any worsening symptoms.

## 2017-11-07 NOTE — ED Triage Notes (Signed)
Pt states he fell down a couple of brick steps at home and hurt his left hand  Pt has a skin tears noted to his left hand and a small abrasion noted to the left side of his upper forehead  Denies LOC   Denies neck pain  States his back hurts a little but that is normal for him

## 2017-11-07 NOTE — ED Provider Notes (Signed)
[facility] DEPT Provider Note   CSN: [id number] Arrival date & time: 11/07/17  2028     History   Chief Complaint Chief Complaint  Patient presents with  . Fall  . Hand Injury    HPI Sean Cooke is a 84 y.o. male.  He is not on anticoagulation.  He states he was returning from church and fell down a couple of bricks steps at home.  He struck his head on the real and his hands on the ground.  He denies LOC.  He is unsure of his last tetanus shot.  He is complaining of moderate left hand pain with range of motion.  He denies any headache nausea vomiting chest pain shortness of breath.  The history is provided by the patient and the spouse.  Fall  This is a new problem. The current episode started 1 to 2 hours ago. The problem has been rapidly improving. Pertinent negatives include no chest pain, no abdominal pain, no headaches and no shortness of breath. Nothing aggravates the symptoms. The symptoms are relieved by rest. He has tried nothing for the symptoms.  Hand Injury   Pertinent negatives include no fever.    Past Medical History:  Diagnosis Date  . Adenomatous colon polyp 05/2003  . Arthritis   . Degenerative joint disease   . Diverticulosis   . Duodenal diverticulum   . Encounter for antineoplastic chemotherapy 07/31/2015  . GERD (gastroesophageal reflux disease)   . Hemorrhoids   . Hiatal hernia   . Lung cancer ([place])   . Neuritis/radiculitis due to displacement of lumbar intervertebral disc   . Pneumonia   . Rib fracture     Patient Active Problem List   Diagnosis Date Noted  . Diarrhea 10/29/2015  . Encounter for antineoplastic chemotherapy 07/31/2015  . Malignant neoplasm of right upper lobe of lung ([place]) 07/15/2015  . Hyponatremia 10/07/2014  . Community acquired bacterial pneumonia 10/04/2014  . Leukocytosis 10/04/2014  . Essential hypertension 10/04/2014  . BPH (benign prostatic hyperplasia) 10/04/2014  . Abdominal  pain, LLQ (left lower quadrant) 04/03/2014  . Constipation 08/20/2013  . Lactose intolerance 08/20/2013  . GERD 04/27/2008  . THROMBOCYTHEMIA 04/23/2008  . Normocytic anemia 04/23/2008  . SPONDYLOSIS, LUMBAR 04/23/2008  . NEURITIS, LUMBOSACRAL 04/23/2008  . OSTEOPENIA 04/23/2008  . ASPLENIA 04/23/2008  . URINARY FREQUENCY 04/23/2008  . COLONIC POLYPS, ADENOMATOUS, HX OF 04/23/2008    Past Surgical History:  Procedure Laterality Date  . CATARACT EXTRACTION     bilateral  . CHOLECYSTECTOMY  06/2003  . INGUINAL HERNIA REPAIR    . SPLENECTOMY  2002  . TRANSURETHRAL RESECTION OF PROSTATE          Home Medications    Prior to Admission medications   Medication Sig Start Date End Date Taking? Authorizing Provider  acetaminophen (TYLENOL) 650 MG CR tablet Take 650 mg by mouth every 6 (six) hours as needed for pain or fever. Reported on 09/22/2015    [provider]  antiseptic oral rinse (BIOTENE) LIQD 15 mLs by Mouth Rinse route as needed for dry mouth.    [provider]  aspirin 81 MG tablet Take 81 mg by mouth at bedtime.     [provider]  Calcium Carb-Cholecalciferol (CALCIUM-VITAMIN D) 500-200 MG-UNIT tablet Take 2 tablets by mouth daily.    [provider]  cromolyn (OPTICROM) 4 % ophthalmic solution Place 4 drops into both eyes daily. 07/25/16   [provider]  denosumab (PROLIA)  60 MG/ML SOLN injection Inject 60 mg into the skin every 6 (six) months. Administer in upper arm, thigh, or abdomen    [provider]  dextromethorphan-guaiFENesin (ROBITUSSIN-DM) 10-100 MG/5ML liquid Take 10 mLs by mouth every 4 (four) hours as needed for cough. Reported on 03/01/2016    [provider]  ferrous sulfate 325 (65 FE) MG tablet Take 325 mg by mouth 2 (two) times daily with a meal.    [provider]  guaiFENesin (MUCINEX) 600 MG 12 hr tablet Take 600 mg by mouth 2 (two) times daily as needed.    [provider]  hydrocortisone (CORTEF) 20 MG tablet  03/29/17   [provider]  lactobacillus acidophilus & bulgar (LACTINEX) chewable tablet Chew 1 tablet by mouth 2 (two) times daily. Store in [employer], Copy, Historical, MD  lidocaine-prilocaine (EMLA) cream Apply 1 application topically as needed. 09/27/15   Curt Bears, MD  loperamide (IMODIUM) 2 MG capsule Take 2 mg by mouth as needed for diarrhea or loose stools. Reported on 03/01/2016    [provider]  loratadine (CLARITIN) 10 MG tablet Take 10 mg by mouth.    [provider]  losartan (COZAAR) 100 MG tablet Take 100 mg by mouth daily. 03/29/16   [provider]  meloxicam (MOBIC) 15 MG tablet Take 1 tablet by mouth daily. 07/03/17   [provider]  Multiple Vitamin (MULTIVITAMIN) tablet Take 1 tablet by mouth daily.    [provider]  Omega-3 Fatty Acids (FISH OIL) 1000 MG CAPS Take 1,000 mg by mouth daily.     [provider]  oxybutynin (DITROPAN) 5 MG tablet Take 5 mg by mouth daily.    [provider]  pantoprazole (PROTONIX) 40 MG tablet Take 40 mg by mouth daily.    [provider]  polyethylene glycol (MIRALAX / GLYCOLAX) packet Take by mouth.    [provider]  Probiotic Product (ALIGN) 4 MG CAPS Take 1 capsule by mouth daily.    [provider]  PROCTOSOL HC 2.5 % rectal cream INSERT INTO THE RECTUM TWO (2) TIMES A DAY. 01/12/16   [provider]  saw palmetto 160 MG capsule Take 160 mg by mouth daily.    [provider]  Tamsulosin HCl (FLOMAX) 0.4 MG CAPS Take 0.4 mg by mouth daily.    [provider]    Family History Family History  Problem Relation Age of Onset  . Diabetes Father   . Colon cancer Neg Hx     Social History Social History   Tobacco Use  . Smoking status: Former Smoker    Types: Cigarettes, Pipe    Last attempt to quit: 08/14/1988    Years since quitting: 29.2    . Smokeless tobacco: Never Used  Substance Use Topics  . Alcohol use: Yes    Alcohol/week: 0.0 oz    Comment: rarely wine  . Drug use: No     Allergies   Benzonatate; Lyrica [pregabalin]; Penicillins; Pneumococcal vaccine polyvalent; Tizanidine; and Tramadol   Review of Systems Review of Systems  Constitutional: Negative for fever.  HENT: Negative for nosebleeds and sore throat.   Eyes: Negative for visual disturbance.  Respiratory: Negative for shortness of breath.   Cardiovascular: Negative for chest pain.  Gastrointestinal: Negative for abdominal pain.  Genitourinary: Negative for dysuria.  Musculoskeletal: Negative for back pain and neck pain.  Skin: Negative for rash.  Neurological: Negative for headaches.  Hematological:  Bruises/bleeds easily.     Physical Exam Updated Vital Signs BP (!) 175/75 (BP Location: Left Arm)   Pulse 76   Temp 98.3 F (36.8 C) (Oral)   Resp 18   SpO2 97%   Physical Exam  Constitutional: He is oriented to person, place, and time. He appears well-developed and well-nourished.  HENT:  Head: Normocephalic.  Right Ear: External ear normal.  Left Ear: External ear normal.  Nose: Nose normal.  Mouth/Throat: Oropharynx is clear and moist.  Abrasion left frontal  Eyes: Pupils are equal, round, and reactive to light. EOM are normal.  Neck: Normal range of motion. Neck supple.  Cardiovascular: Normal rate, regular rhythm, normal heart sounds and intact distal pulses.  Pulmonary/Chest: Effort normal and breath sounds normal. No respiratory distress.  Abdominal: Soft. Bowel sounds are normal. He exhibits no mass. There is no tenderness. There is no guarding.  Musculoskeletal: Normal range of motion.  He has some ecchymosis of his right index finger and a few small superficial abrasions in his right palm.  On his left hand he is got multiple skin tears on the dorsum of his hand and wrist.  All range of motion of all digits cap refill and  sensory intact distal.  Neurological: He is alert and oriented to person, place, and time.  Skin: Skin is warm and dry. Capillary refill takes less than 2 seconds. No rash noted.  Psychiatric: He has a normal mood and affect.     ED Treatments / Results  Labs (all labs ordered are listed, but only abnormal results are displayed) Labs Reviewed - No data to display  EKG None  Radiology Ct Head Wo Contrast  Result Date: 11/07/2017 CLINICAL DATA:  84 year old male status post fall down brick steps. Left forehead abrasion. Personal history of lung cancer. EXAM: CT HEAD WITHOUT CONTRAST TECHNIQUE: Contiguous axial images were obtained from the base of the skull through the vertex without intravenous contrast. COMPARISON:  Brain MRI 07/14/2015. FINDINGS: Brain: . cerebral volume is stable since 2016. Mild for age bilateral cerebral white matter hypodensity appears stable. No midline shift, ventriculomegaly, mass effect, evidence of mass lesion, intracranial hemorrhage or evidence of cortically based acute infarction. No cortical encephalomalacia identified. Vascular: Calcified atherosclerosis at the skull base. No suspicious intracranial vascular hyperdensity. Skull: No acute osseous abnormality identified. Sinuses/Orbits: Visualized paranasal sinuses and mastoids are stable and well pneumatized. Other: No scalp hematoma identified. Visible orbits soft tissues are within normal limits. IMPRESSION: No acute intracranial abnormality or acute traumatic injury identified. Noncontrast CT appearance of the brain appears stable to the 2016 MRI. Electronically Signed   By: Genevie Ann M.D.   On: 11/07/2017 22:43   Dg Hand Complete Left  Result Date: 11/07/2017 CLINICAL DATA:  Fall with pain EXAM: LEFT HAND - COMPLETE 3+ VIEW COMPARISON:  None. FINDINGS: No acute displaced fracture or malalignment. Mild degenerative changes at the DIP and PIP joints. Calcifications at the wrist. IMPRESSION: 1. No acute osseous  abnormality 2. Chondrocalcinosis Electronically Signed   By: Donavan Foil M.D.   On: 11/07/2017 22:25   Dg Hand Complete Right  Result Date: 11/07/2017 CLINICAL DATA:  Fall with hand pain EXAM: RIGHT HAND - COMPLETE 3+ VIEW COMPARISON:  None. FINDINGS: No acute displaced fracture or malalignment. Degenerative changes at the fourth MCP and PIP joints. Degenerative changes at the first PIP joint. Calcifications at the triangular fibrocartilage. IMPRESSION: 1. No definite acute osseous abnormality. 2. Degenerative changes.  Chondrocalcinosis. Electronically Signed   By:  Donavan Foil M.D.   On: 11/07/2017 22:23    Procedures Procedures (including critical care time)  Medications Ordered in ED Medications  Tdap (BOOSTRIX) injection 0.5 mL (has no administration in time range)     Initial Impression / Assessment and Plan / ED Course  I have reviewed the triage vital signs and the nursing notes.  Pertinent labs & imaging results that were available during my care of the patient were reviewed by me and considered in my medical decision making (see chart for details).  Clinical Course as of Nov 08 1204  Wed Nov 07, 2017  2247 Imaging negative for any acute fracture.  The nurses washed his wounds and applied some Steri-Strips.  Feel he would be safe to go home and follow-up with his PCP with some wound management instructions.   [MB]    Clinical Course User Index [MB] Hayden Rasmussen, MD   ddx - ich, c-spine fracture, hand fracture, retained fb, contusions/skin tears  Final Clinical Impressions(s) / ED Diagnoses   Final diagnoses:  Fall, initial encounter  Skin tear of hand without complication, initial encounter  Injury of head in adult  Forehead contusion, initial encounter    ED Discharge Orders    None       Hayden Rasmussen, MD 11/08/17 1208

## 2017-11-07 NOTE — ED Notes (Signed)
Pt hand has been cleaned and dressed.

## 2017-11-07 NOTE — ED Notes (Signed)
Patient transported to CT 

## 2017-11-08 DIAGNOSIS — S60512A Abrasion of left hand, initial encounter: Secondary | ICD-10-CM | POA: Diagnosis not present

## 2017-11-08 DIAGNOSIS — M5136 Other intervertebral disc degeneration, lumbar region: Secondary | ICD-10-CM | POA: Diagnosis not present

## 2017-11-09 DIAGNOSIS — S60512D Abrasion of left hand, subsequent encounter: Secondary | ICD-10-CM | POA: Diagnosis not present

## 2017-11-12 DIAGNOSIS — S60512D Abrasion of left hand, subsequent encounter: Secondary | ICD-10-CM | POA: Diagnosis not present

## 2017-11-16 DIAGNOSIS — M5136 Other intervertebral disc degeneration, lumbar region: Secondary | ICD-10-CM | POA: Diagnosis not present

## 2017-11-16 DIAGNOSIS — S60512D Abrasion of left hand, subsequent encounter: Secondary | ICD-10-CM | POA: Diagnosis not present

## 2017-11-20 DIAGNOSIS — M545 Low back pain: Secondary | ICD-10-CM | POA: Diagnosis not present

## 2017-11-20 DIAGNOSIS — S60512D Abrasion of left hand, subsequent encounter: Secondary | ICD-10-CM | POA: Diagnosis not present

## 2017-11-26 DIAGNOSIS — S60512D Abrasion of left hand, subsequent encounter: Secondary | ICD-10-CM | POA: Diagnosis not present

## 2017-11-26 DIAGNOSIS — M5136 Other intervertebral disc degeneration, lumbar region: Secondary | ICD-10-CM | POA: Diagnosis not present

## 2017-11-26 MED FILL — MAGIC MOUTHWASH BOP FORM: 16 days supply | Qty: 240 | Fill #0

## 2017-11-28 DIAGNOSIS — S60512D Abrasion of left hand, subsequent encounter: Secondary | ICD-10-CM | POA: Diagnosis not present

## 2017-11-28 DIAGNOSIS — M5136 Other intervertebral disc degeneration, lumbar region: Secondary | ICD-10-CM | POA: Diagnosis not present

## 2017-12-05 DIAGNOSIS — M5136 Other intervertebral disc degeneration, lumbar region: Secondary | ICD-10-CM | POA: Diagnosis not present

## 2017-12-10 DIAGNOSIS — M5136 Other intervertebral disc degeneration, lumbar region: Secondary | ICD-10-CM | POA: Diagnosis not present

## 2017-12-10 DIAGNOSIS — S60512D Abrasion of left hand, subsequent encounter: Secondary | ICD-10-CM | POA: Diagnosis not present

## 2017-12-24 MED FILL — MAGIC MOUTHWASH BOP FORM: 16 days supply | Qty: 240 | Fill #1

## 2017-12-31 DIAGNOSIS — M545 Low back pain: Secondary | ICD-10-CM | POA: Diagnosis not present

## 2018-01-01 ENCOUNTER — Telehealth: Payer: Self-pay | Admitting: Medical Oncology

## 2018-01-01 NOTE — Telephone Encounter (Signed)
1. Pt asking if Sean Cooke is okay if he takes an epidural injection prescribed by Dr Gladstone Lighter.  2. Does pt need to be NPO for chest CT? Per Sean Cooke pt notified that is  okay for injection and per radiology pt does not need to be NPO for chest CT.

## 2018-01-02 DIAGNOSIS — E222 Syndrome of inappropriate secretion of antidiuretic hormone: Secondary | ICD-10-CM | POA: Diagnosis not present

## 2018-01-02 DIAGNOSIS — R7303 Prediabetes: Secondary | ICD-10-CM | POA: Diagnosis not present

## 2018-01-02 DIAGNOSIS — I10 Essential (primary) hypertension: Secondary | ICD-10-CM | POA: Diagnosis not present

## 2018-01-09 DIAGNOSIS — E871 Hypo-osmolality and hyponatremia: Secondary | ICD-10-CM | POA: Diagnosis not present

## 2018-01-09 DIAGNOSIS — M810 Age-related osteoporosis without current pathological fracture: Secondary | ICD-10-CM | POA: Diagnosis not present

## 2018-01-09 DIAGNOSIS — E559 Vitamin D deficiency, unspecified: Secondary | ICD-10-CM | POA: Diagnosis not present

## 2018-01-14 ENCOUNTER — Inpatient Hospital Stay: Payer: PPO | Attending: Internal Medicine

## 2018-01-14 ENCOUNTER — Other Ambulatory Visit: Payer: PPO

## 2018-01-14 ENCOUNTER — Ambulatory Visit (HOSPITAL_COMMUNITY)
Admission: RE | Admit: 2018-01-14 | Discharge: 2018-01-14 | Disposition: A | Discharge status: Home / Self Care | Payer: PPO | Source: Ambulatory Visit | Attending: Internal Medicine | Admitting: Internal Medicine

## 2018-01-14 DIAGNOSIS — J439 Emphysema, unspecified: Secondary | ICD-10-CM | POA: Insufficient documentation

## 2018-01-14 DIAGNOSIS — Z85118 Personal history of other malignant neoplasm of bronchus and lung: Secondary | ICD-10-CM | POA: Diagnosis not present

## 2018-01-14 DIAGNOSIS — N62 Hypertrophy of breast: Secondary | ICD-10-CM | POA: Insufficient documentation

## 2018-01-14 DIAGNOSIS — I2510 Atherosclerotic heart disease of native coronary artery without angina pectoris: Secondary | ICD-10-CM | POA: Insufficient documentation

## 2018-01-14 DIAGNOSIS — C3491 Malignant neoplasm of unspecified part of right bronchus or lung: Secondary | ICD-10-CM | POA: Diagnosis not present

## 2018-01-14 DIAGNOSIS — C3490 Malignant neoplasm of unspecified part of unspecified bronchus or lung: Secondary | ICD-10-CM | POA: Diagnosis present

## 2018-01-14 DIAGNOSIS — K8689 Other specified diseases of pancreas: Secondary | ICD-10-CM | POA: Insufficient documentation

## 2018-01-14 DIAGNOSIS — R918 Other nonspecific abnormal finding of lung field: Secondary | ICD-10-CM | POA: Diagnosis not present

## 2018-01-14 DIAGNOSIS — Z9221 Personal history of antineoplastic chemotherapy: Secondary | ICD-10-CM | POA: Insufficient documentation

## 2018-01-14 DIAGNOSIS — I700 Atherosclerosis of aorta: Secondary | ICD-10-CM | POA: Insufficient documentation

## 2018-01-14 DIAGNOSIS — R911 Solitary pulmonary nodule: Secondary | ICD-10-CM | POA: Diagnosis not present

## 2018-01-14 DIAGNOSIS — I288 Other diseases of pulmonary vessels: Secondary | ICD-10-CM | POA: Diagnosis not present

## 2018-01-14 LAB — CBC WITH DIFFERENTIAL (CANCER CENTER ONLY)
Basophils Absolute: 0 10*3/uL (ref 0.0–0.1)
Basophils Relative: 0 %
EOS ABS: 0.1 10*3/uL (ref 0.0–0.5)
Eosinophils Relative: 1 %
HCT: 33.1 % — ABNORMAL LOW (ref 38.4–49.9)
Hemoglobin: 11.2 g/dL — ABNORMAL LOW (ref 13.0–17.1)
LYMPHS ABS: 1.4 10*3/uL (ref 0.9–3.3)
Lymphocytes Relative: 13 %
MCH: 34.1 pg — ABNORMAL HIGH (ref 27.2–33.4)
MCHC: 33.9 g/dL (ref 32.0–36.0)
MCV: 100.7 fL — ABNORMAL HIGH (ref 79.3–98.0)
MONO ABS: 1.4 10*3/uL — AB (ref 0.1–0.9)
Monocytes Relative: 13 %
Neutro Abs: 8 10*3/uL — ABNORMAL HIGH (ref 1.5–6.5)
Neutrophils Relative %: 73 %
Platelet Count: 364 10*3/uL (ref 140–400)
RBC: 3.29 MIL/uL — AB (ref 4.20–5.82)
RDW: 13.6 % (ref 11.0–14.6)
WBC: 10.9 10*3/uL — AB (ref 4.0–10.3)

## 2018-01-14 LAB — CMP (CANCER CENTER ONLY)
ALBUMIN: 3.8 g/dL (ref 3.5–5.0)
ALK PHOS: 64 U/L (ref 40–150)
ALT: 17 U/L (ref 0–55)
AST: 22 U/L (ref 5–34)
Anion gap: 8 (ref 3–11)
BILIRUBIN TOTAL: 0.5 mg/dL (ref 0.2–1.2)
BUN: 12 mg/dL (ref 7–26)
CALCIUM: 10 mg/dL (ref 8.4–10.4)
CO2: 27 mmol/L (ref 22–29)
CREATININE: 0.66 mg/dL — AB (ref 0.70–1.30)
Chloride: 97 mmol/L — ABNORMAL LOW (ref 98–109)
GFR, Est AFR Am: >60 mL/min (ref >60)
GFR, Estimated: >60 mL/min (ref >60)
GLUCOSE: 103 mg/dL (ref 70–140)
Potassium: 4 mmol/L (ref 3.5–5.1)
Sodium: 132 mmol/L — ABNORMAL LOW (ref 136–145)
Total Protein: 6.5 g/dL (ref 6.4–8.3)

## 2018-01-14 MED ORDER — IOHEXOL 300 MG/ML  SOLN
75.0000 mL | Freq: Once | INTRAMUSCULAR | Status: AC | PRN
  Administered 2018-01-14: 75 mL via INTRAVENOUS

## 2018-01-15 DIAGNOSIS — I10 Essential (primary) hypertension: Secondary | ICD-10-CM | POA: Diagnosis not present

## 2018-01-16 ENCOUNTER — Inpatient Hospital Stay (HOSPITAL_BASED_OUTPATIENT_CLINIC_OR_DEPARTMENT_OTHER): Payer: PPO | Admitting: Internal Medicine

## 2018-01-16 ENCOUNTER — Encounter: Payer: Self-pay | Admitting: Internal Medicine

## 2018-01-16 ENCOUNTER — Telehealth: Payer: Self-pay | Admitting: Internal Medicine

## 2018-01-16 VITALS — BP 158/76 | HR 73 | Temp 97.5°F | Resp 18 | Ht 68.0 in | Wt 158.8 lb

## 2018-01-16 DIAGNOSIS — I10 Essential (primary) hypertension: Secondary | ICD-10-CM | POA: Diagnosis not present

## 2018-01-16 DIAGNOSIS — C3490 Malignant neoplasm of unspecified part of unspecified bronchus or lung: Secondary | ICD-10-CM

## 2018-01-16 DIAGNOSIS — C3411 Malignant neoplasm of upper lobe, right bronchus or lung: Secondary | ICD-10-CM

## 2018-01-16 DIAGNOSIS — D649 Anemia, unspecified: Secondary | ICD-10-CM | POA: Diagnosis not present

## 2018-01-16 DIAGNOSIS — C3491 Malignant neoplasm of unspecified part of right bronchus or lung: Secondary | ICD-10-CM | POA: Diagnosis not present

## 2018-01-16 NOTE — Progress Notes (Signed)
[place] [phone]) [phone]   [phone]) [phone]  OFFICE PROGRESS NOTE  [name], MD No address on file  DIAGNOSIS:  Stage IIIB (T1a, N3, M0) non-small cell lung cancer, adenocarcinoma with negative EGFR, ALK, ROS 1 but positive RET mutations presented with right middle lobe lung nodule in addition to mediastinal and left supraclavicular lymphadenopathy diagnosed in November 2016.  PRIOR THERAPY:  1) Systemic chemotherapy with carboplatin for AUC of 5 and Alimta 500 MG/M2 every 3 weeks status post 6 cycles with partial response. 2) Maintenance systemic chemotherapy with single agent Alimta 500 MG/M2 every 3 weeks, status post 15 cycles.  CURRENT THERAPY: Observation.  INTERVAL HISTORY: Sean Cooke 84 y.o. male returns to the clinic today for follow-up visit.  The patient is feeling fine today with no specific complaints.  He continues to have low back pain and he is followed by Dr. Gladstone Lighter and received steroid injection.  He denied having any chest pain, shortness of breath, cough or hemoptysis.  He denied having any fever or chills.  He has no nausea, vomiting, diarrhea or constipation.  He has no recent weight loss or night sweats.  The patient had a repeat CT scan of the chest performed recently and is here for evaluation and discussion of his discuss results.  MEDICAL HISTORY: Past Medical History:  Diagnosis Date  . Adenomatous colon polyp 05/2003  . Arthritis   . Degenerative joint disease   . Diverticulosis   . Duodenal diverticulum   . Encounter for antineoplastic chemotherapy 07/31/2015  . GERD (gastroesophageal reflux disease)   . Hemorrhoids   . Hiatal hernia   . Lung cancer ([place])   . Neuritis/radiculitis due to displacement of lumbar intervertebral disc   . Pneumonia   . Rib fracture     ALLERGIES:  is allergic to benzonatate; lyrica [pregabalin]; penicillins; pneumococcal vaccine polyvalent; tizanidine; and  tramadol.  MEDICATIONS:  Current Outpatient Medications  Medication Sig Dispense Refill  . acetaminophen (TYLENOL) 650 MG CR tablet Take 650 mg by mouth every 6 (six) hours as needed for pain or fever. Reported on 09/22/2015    . antiseptic oral rinse (BIOTENE) LIQD 15 mLs by Mouth Rinse route as needed for dry mouth.    Marland Kitchen aspirin 81 MG tablet Take 81 mg by mouth at bedtime.     . Calcium Carb-Cholecalciferol (CALCIUM-VITAMIN D) 500-200 MG-UNIT tablet Take 2 tablets by mouth daily.    . cromolyn (OPTICROM) 4 % ophthalmic solution Place 4 drops into both eyes daily.    Marland Kitchen denosumab (PROLIA) 60 MG/ML SOLN injection Inject 60 mg into the skin every 6 (six) months. Administer in upper arm, thigh, or abdomen    . dextromethorphan-guaiFENesin (ROBITUSSIN-DM) 10-100 MG/5ML liquid Take 10 mLs by mouth every 4 (four) hours as needed for cough. Reported on 03/01/2016    . ferrous sulfate 325 (65 FE) MG tablet Take 325 mg by mouth 2 (two) times daily with a meal.    . guaiFENesin (MUCINEX) 600 MG 12 hr tablet Take 600 mg by mouth 2 (two) times daily as needed.    . hydrocortisone (CORTEF) 20 MG tablet     . lactobacillus acidophilus & bulgar (LACTINEX) chewable tablet Chew 1 tablet by mouth 2 (two) times daily. Store in [place]    . lidocaine-prilocaine (EMLA) cream Apply 1 application topically as needed. 30 g 3  . loperamide (IMODIUM) 2 MG capsule Take 2 mg by mouth as needed for diarrhea  or loose stools. Reported on 03/01/2016    . loratadine (CLARITIN) 10 MG tablet Take 10 mg by mouth.    . losartan (COZAAR) 100 MG tablet Take 100 mg by mouth daily.    . meloxicam (MOBIC) 15 MG tablet Take 1 tablet by mouth daily.    . Multiple Vitamin (MULTIVITAMIN) tablet Take 1 tablet by mouth daily.    . Omega-3 Fatty Acids (FISH OIL) 1000 MG CAPS Take 1,000 mg by mouth daily.     Marland Kitchen oxybutynin (DITROPAN) 5 MG tablet Take 5 mg by mouth daily.    . pantoprazole (PROTONIX) 40 MG tablet Take 40 mg by mouth daily.     . polyethylene glycol (MIRALAX / GLYCOLAX) packet Take by mouth.    . Probiotic Product (ALIGN) 4 MG CAPS Take 1 capsule by mouth daily.    Marland Kitchen PROCTOSOL HC 2.5 % rectal cream INSERT INTO THE RECTUM TWO (2) TIMES A DAY.  0  . saw palmetto 160 MG capsule Take 160 mg by mouth daily.    . Tamsulosin HCl (FLOMAX) 0.4 MG CAPS Take 0.4 mg by mouth daily.     No current facility-administered medications for this visit.     SURGICAL HISTORY:  Past Surgical History:  Procedure Laterality Date  . CATARACT EXTRACTION     bilateral  . CHOLECYSTECTOMY  06/2003  . INGUINAL HERNIA REPAIR    . SPLENECTOMY  2002  . TRANSURETHRAL RESECTION OF PROSTATE      REVIEW OF SYSTEMS:  A comprehensive review of systems was negative except for: Musculoskeletal: positive for back pain   PHYSICAL EXAMINATION: General appearance: alert, cooperative and no distress Head: Normocephalic, without obvious abnormality, atraumatic Neck: no adenopathy, no JVD, supple, symmetrical, trachea midline and thyroid not enlarged, symmetric, no tenderness/mass/nodules Lymph nodes: Cervical, supraclavicular, and axillary nodes normal. Resp: clear to auscultation bilaterally Back: symmetric, no curvature. ROM normal. No CVA tenderness. Cardio: regular rate and rhythm, S1, S2 normal, no murmur, click, rub or gallop GI: soft, non-tender; bowel sounds normal; no masses,  no organomegaly Extremities: extremities normal, atraumatic, no cyanosis or edema  ECOG PERFORMANCE STATUS: 1 - Symptomatic but completely ambulatory  Blood pressure (!) 158/76, pulse 73, temperature (!) 97.5 F (36.4 C), temperature source Oral, resp. rate 18, height _0  (1.727 m), weight 158 lb 12.8 oz (72 kg), SpO2 100 %.  LABORATORY DATA: Lab Results  Component Value Date   WBC 10.9 (H) 01/14/2018   HGB 11.2 (L) 01/14/2018   HCT 33.1 (L) 01/14/2018   MCV 100.7 (H) 01/14/2018   PLT 364 01/14/2018      Chemistry      Component Value Date/Time   NA  132 (L) 01/14/2018 1046   NA 133 (L) 06/15/2017 0816   K 4.0 01/14/2018 1046   K 4.1 06/15/2017 0816   CL 97 (L) 01/14/2018 1046   CO2 27 01/14/2018 1046   CO2 27 06/15/2017 0816   BUN 12 01/14/2018 1046   BUN 9.8 06/15/2017 0816   CREATININE 0.66 (L) 01/14/2018 1046   CREATININE 0.7 06/15/2017 0816      Component Value Date/Time   CALCIUM 10.0 01/14/2018 1046   CALCIUM 10.2 06/15/2017 0816   ALKPHOS 64 01/14/2018 1046   ALKPHOS 65 06/15/2017 0816   AST 22 01/14/2018 1046   AST 23 06/15/2017 0816   ALT 17 01/14/2018 1046   ALT 22 06/15/2017 0816   BILITOT 0.5 01/14/2018 1046   BILITOT 0.64 06/15/2017 0816  RADIOGRAPHIC STUDIES: Ct Chest W Contrast  Result Date: 01/14/2018 CLINICAL DATA:  Right-sided lung cancer diagnosed 12/16. Last chemotherapy 1 year ago. Back pain. EXAM: CT CHEST WITH CONTRAST TECHNIQUE: Multidetector CT imaging of the chest was performed during intravenous contrast administration. CONTRAST:  52m OMNIPAQUE IOHEXOL 300 MG/ML  SOLN COMPARISON:  10/15/2017 FINDINGS: Cardiovascular: A right-sided Port-A-Cath which terminates at the superior caval/atrial junction. Normal heart size with minimal pericardial fluid or thickening, favored to be physiologic. Multivessel coronary artery atherosclerosis. No central pulmonary embolism, on this non-dedicated study. Pulmonary artery enlargement, with a 3.3 cm right pulmonary artery. Mediastinum/Nodes: No supraclavicular adenopathy. No mediastinal or hilar adenopathy. Lungs/Pleura: No pleural fluid.  Mild centrilobular emphysema. Similar right middle lobe scarring. Pleural-based right lower lobe 7 mm nodular density including on image 75/7, new since the prior. A 2 mm lingular nodule is unchanged including on 78/7. Upper Abdomen: Normal imaged portions of the liver, stomach. Suspect a periampullary duodenal diverticulum. Splenectomy with left upper quadrant splenic tissue. Normal adrenal glands and kidneys. Cholecystectomy.  Pancreatic atrophy again identified. Area of relative soft tissue fullness at the body/tail junction measures on the order of 2.9 x 2.3 cm on image 150/2. Compare 2.8 x 2.1 cm on the prior exam (when remeasured). No surrounding inflammation. Abdominal aortic and branch vessel atherosclerosis. Musculoskeletal: Right-sided gynecomastia is moderate. No acute osseous abnormality. IMPRESSION: 1. Pleural-based right lower lobe pulmonary nodule of 7 mm, new since the prior. Cannot exclude metastatic disease. Consider CT follow-up at 3-6 months. 2. Otherwise, no evidence of metastatic disease in the chest. 3. Aortic atherosclerosis (ICD10-I70.0), coronary artery atherosclerosis and emphysema (ICD10-J43.9). 4. Pulmonary artery enlargement suggests pulmonary arterial hypertension. 5. Pancreatic atrophy with an area of persistent body/tail junction soft tissue fullness. This may be slightly increased. Recommend attention on follow-up. 6. Moderate right gynecomastia Electronically Signed   By: KAbigail MiyamotoM.D.   On: 01/14/2018 14:17    ASSESSMENT AND PLAN:  This is a very pleasant 84years old white male with stage IIIB non-small cell lung cancer, adenocarcinoma status post induction systemic chemotherapy with carboplatin and Alimta followed by maintenance treatment with single agent Alimta status post 15 cycles and has been tolerating the treatment well except for fatigue and weakness. The patient is currently on observation and he is feeling well. Repeat CT scan of the chest showed no concerning findings except for pleural-based right lower lobe new pulmonary nodule measuring 7 mm.  This is suspicious for inflammatory process versus neoplasm.  I personally and independently reviewed the scan images and discussed the result and showed the images to the patient today. I recommended for him to continue on observation with repeat CT scan of the chest in 3 months for reevaluation of this nodule and to rule out any  recurrence of his disease. The patient was advised to call immediately if he has any concerning symptoms in the interval. The patient voices understanding of current disease status and treatment options and is in agreement with the current care plan. All questions were answered. The patient knows to call the clinic with any problems, questions or concerns. We can certainly see the patient much sooner if necessary.  Disclaimer: This note was dictated with voice recognition software. Similar sounding words can inadvertently be transcribed and may not be corrected upon review.

## 2018-01-16 NOTE — Telephone Encounter (Signed)
Appointments scheduled AVS/Calenddar printed per 6/5 los

## 2018-01-22 DIAGNOSIS — M5136 Other intervertebral disc degeneration, lumbar region: Secondary | ICD-10-CM | POA: Diagnosis not present

## 2018-01-28 MED FILL — MAGIC MOUTHWASH BOP FORM: 16 days supply | Qty: 240 | Fill #0

## 2018-02-07 DIAGNOSIS — M5136 Other intervertebral disc degeneration, lumbar region: Secondary | ICD-10-CM | POA: Diagnosis not present

## 2018-02-18 DIAGNOSIS — M810 Age-related osteoporosis without current pathological fracture: Secondary | ICD-10-CM | POA: Diagnosis not present

## 2018-02-28 DIAGNOSIS — R3914 Feeling of incomplete bladder emptying: Secondary | ICD-10-CM | POA: Diagnosis not present

## 2018-02-28 DIAGNOSIS — N401 Enlarged prostate with lower urinary tract symptoms: Secondary | ICD-10-CM | POA: Diagnosis not present

## 2018-02-28 DIAGNOSIS — R351 Nocturia: Secondary | ICD-10-CM | POA: Diagnosis not present

## 2018-02-28 DIAGNOSIS — N3281 Overactive bladder: Secondary | ICD-10-CM | POA: Diagnosis not present

## 2018-03-14 DIAGNOSIS — N401 Enlarged prostate with lower urinary tract symptoms: Secondary | ICD-10-CM | POA: Diagnosis not present

## 2018-03-14 DIAGNOSIS — N3281 Overactive bladder: Secondary | ICD-10-CM | POA: Diagnosis not present

## 2018-03-14 DIAGNOSIS — R3914 Feeling of incomplete bladder emptying: Secondary | ICD-10-CM | POA: Diagnosis not present

## 2018-03-14 DIAGNOSIS — R351 Nocturia: Secondary | ICD-10-CM | POA: Diagnosis not present

## 2018-03-21 DIAGNOSIS — M5136 Other intervertebral disc degeneration, lumbar region: Secondary | ICD-10-CM | POA: Diagnosis not present

## 2018-03-25 DIAGNOSIS — H9113 Presbycusis, bilateral: Secondary | ICD-10-CM | POA: Diagnosis not present

## 2018-03-25 DIAGNOSIS — H90A22 Sensorineural hearing loss, unilateral, left ear, with restricted hearing on the contralateral side: Secondary | ICD-10-CM | POA: Diagnosis not present

## 2018-03-25 DIAGNOSIS — H90A31 Mixed conductive and sensorineural hearing loss, unilateral, right ear with restricted hearing on the contralateral side: Secondary | ICD-10-CM | POA: Diagnosis not present

## 2018-04-01 DIAGNOSIS — M5136 Other intervertebral disc degeneration, lumbar region: Secondary | ICD-10-CM | POA: Diagnosis not present

## 2018-04-02 ENCOUNTER — Other Ambulatory Visit: Payer: Self-pay | Admitting: Orthopedic Surgery

## 2018-04-02 DIAGNOSIS — M5126 Other intervertebral disc displacement, lumbar region: Secondary | ICD-10-CM

## 2018-04-04 ENCOUNTER — Telehealth: Payer: Self-pay | Admitting: Medical Oncology

## 2018-04-04 NOTE — Telephone Encounter (Signed)
Clarified that he can take CT chest because it is different from what Dr Linden Dolin ordered.

## 2018-04-08 ENCOUNTER — Telehealth: Payer: Self-pay | Admitting: Medical Oncology

## 2018-04-08 NOTE — Telephone Encounter (Signed)
asking if he can have dental filings-I told him yes.

## 2018-04-10 MED FILL — MAGIC MOUTHWASH BOP FORM: 16 days supply | Qty: 240 | Fill #0

## 2018-04-16 ENCOUNTER — Ambulatory Visit (HOSPITAL_COMMUNITY)
Admission: RE | Admit: 2018-04-16 | Discharge: 2018-04-16 | Disposition: A | Discharge status: Home / Self Care | Payer: PPO | Source: Ambulatory Visit | Attending: Internal Medicine | Admitting: Internal Medicine

## 2018-04-16 ENCOUNTER — Inpatient Hospital Stay: Payer: PPO | Attending: Internal Medicine

## 2018-04-16 DIAGNOSIS — Z85118 Personal history of other malignant neoplasm of bronchus and lung: Secondary | ICD-10-CM | POA: Insufficient documentation

## 2018-04-16 DIAGNOSIS — K869 Disease of pancreas, unspecified: Secondary | ICD-10-CM | POA: Diagnosis not present

## 2018-04-16 DIAGNOSIS — C3490 Malignant neoplasm of unspecified part of unspecified bronchus or lung: Secondary | ICD-10-CM | POA: Diagnosis not present

## 2018-04-16 DIAGNOSIS — Z7982 Long term (current) use of aspirin: Secondary | ICD-10-CM | POA: Insufficient documentation

## 2018-04-16 DIAGNOSIS — M549 Dorsalgia, unspecified: Secondary | ICD-10-CM | POA: Insufficient documentation

## 2018-04-16 DIAGNOSIS — Z9221 Personal history of antineoplastic chemotherapy: Secondary | ICD-10-CM | POA: Diagnosis not present

## 2018-04-16 DIAGNOSIS — Z9049 Acquired absence of other specified parts of digestive tract: Secondary | ICD-10-CM | POA: Insufficient documentation

## 2018-04-16 DIAGNOSIS — I700 Atherosclerosis of aorta: Secondary | ICD-10-CM | POA: Insufficient documentation

## 2018-04-16 DIAGNOSIS — J439 Emphysema, unspecified: Secondary | ICD-10-CM | POA: Insufficient documentation

## 2018-04-16 DIAGNOSIS — N281 Cyst of kidney, acquired: Secondary | ICD-10-CM | POA: Diagnosis not present

## 2018-04-16 DIAGNOSIS — Z9081 Acquired absence of spleen: Secondary | ICD-10-CM | POA: Diagnosis not present

## 2018-04-16 DIAGNOSIS — K219 Gastro-esophageal reflux disease without esophagitis: Secondary | ICD-10-CM | POA: Insufficient documentation

## 2018-04-16 DIAGNOSIS — Z79899 Other long term (current) drug therapy: Secondary | ICD-10-CM | POA: Insufficient documentation

## 2018-04-16 DIAGNOSIS — I313 Pericardial effusion (noninflammatory): Secondary | ICD-10-CM | POA: Diagnosis not present

## 2018-04-16 DIAGNOSIS — K449 Diaphragmatic hernia without obstruction or gangrene: Secondary | ICD-10-CM | POA: Insufficient documentation

## 2018-04-16 DIAGNOSIS — I2510 Atherosclerotic heart disease of native coronary artery without angina pectoris: Secondary | ICD-10-CM | POA: Diagnosis not present

## 2018-04-16 DIAGNOSIS — C259 Malignant neoplasm of pancreas, unspecified: Secondary | ICD-10-CM | POA: Diagnosis not present

## 2018-04-16 LAB — CMP (CANCER CENTER ONLY)
ALT: 17 U/L (ref 0–44)
AST: 21 U/L (ref 15–41)
Albumin: 3.6 g/dL (ref 3.5–5.0)
Alkaline Phosphatase: 65 U/L (ref 38–126)
Anion gap: 8 (ref 5–15)
BUN: 9 mg/dL (ref 8–23)
CHLORIDE: 97 mmol/L — AB (ref 98–111)
CO2: 25 mmol/L (ref 22–32)
Calcium: 10.2 mg/dL (ref 8.9–10.3)
Creatinine: 0.68 mg/dL (ref 0.61–1.24)
GFR, Est AFR Am: >60 mL/min (ref >60)
GFR, Estimated: >60 mL/min (ref >60)
GLUCOSE: 116 mg/dL — AB (ref 70–99)
Potassium: 4.7 mmol/L (ref 3.5–5.1)
SODIUM: 130 mmol/L — AB (ref 135–145)
Total Bilirubin: 0.7 mg/dL (ref 0.3–1.2)
Total Protein: 6.3 g/dL — ABNORMAL LOW (ref 6.5–8.1)

## 2018-04-16 LAB — CBC WITH DIFFERENTIAL (CANCER CENTER ONLY)
Basophils Absolute: 0 10*3/uL (ref 0.0–0.1)
Basophils Relative: 0 %
EOS ABS: 0.2 10*3/uL (ref 0.0–0.5)
EOS PCT: 2 %
HCT: 31.6 % — ABNORMAL LOW (ref 38.4–49.9)
Hemoglobin: 11 g/dL — ABNORMAL LOW (ref 13.0–17.1)
Lymphocytes Relative: 15 %
Lymphs Abs: 1.5 10*3/uL (ref 0.9–3.3)
MCH: 34.6 pg — AB (ref 27.2–33.4)
MCHC: 35 g/dL (ref 32.0–36.0)
MCV: 99 fL — ABNORMAL HIGH (ref 79.3–98.0)
MONOS PCT: 14 %
Monocytes Absolute: 1.4 10*3/uL — ABNORMAL HIGH (ref 0.1–0.9)
Neutro Abs: 6.9 10*3/uL — ABNORMAL HIGH (ref 1.5–6.5)
Neutrophils Relative %: 69 %
Platelet Count: 401 10*3/uL — ABNORMAL HIGH (ref 140–400)
RBC: 3.19 MIL/uL — ABNORMAL LOW (ref 4.20–5.82)
RDW: 13.1 % (ref 11.0–14.6)
WBC Count: 9.9 10*3/uL (ref 4.0–10.3)

## 2018-04-16 MED ORDER — IOHEXOL 300 MG/ML  SOLN
75.0000 mL | Freq: Once | INTRAMUSCULAR | Status: AC | PRN
  Administered 2018-04-16: 75 mL via INTRAVENOUS

## 2018-04-18 ENCOUNTER — Telehealth: Payer: Self-pay | Admitting: Internal Medicine

## 2018-04-18 ENCOUNTER — Encounter: Payer: Self-pay | Admitting: *Deleted

## 2018-04-18 ENCOUNTER — Inpatient Hospital Stay (HOSPITAL_BASED_OUTPATIENT_CLINIC_OR_DEPARTMENT_OTHER): Payer: PPO | Admitting: Internal Medicine

## 2018-04-18 ENCOUNTER — Encounter: Payer: Self-pay | Admitting: Internal Medicine

## 2018-04-18 VITALS — BP 155/72 | HR 78 | Temp 97.7°F | Resp 17 | Ht 68.0 in | Wt 161.4 lb

## 2018-04-18 DIAGNOSIS — K869 Disease of pancreas, unspecified: Secondary | ICD-10-CM | POA: Diagnosis not present

## 2018-04-18 DIAGNOSIS — C3411 Malignant neoplasm of upper lobe, right bronchus or lung: Secondary | ICD-10-CM

## 2018-04-18 DIAGNOSIS — Z79899 Other long term (current) drug therapy: Secondary | ICD-10-CM | POA: Diagnosis not present

## 2018-04-18 DIAGNOSIS — Z85118 Personal history of other malignant neoplasm of bronchus and lung: Secondary | ICD-10-CM

## 2018-04-18 DIAGNOSIS — Z9221 Personal history of antineoplastic chemotherapy: Secondary | ICD-10-CM

## 2018-04-18 DIAGNOSIS — I10 Essential (primary) hypertension: Secondary | ICD-10-CM

## 2018-04-18 DIAGNOSIS — M549 Dorsalgia, unspecified: Secondary | ICD-10-CM | POA: Diagnosis not present

## 2018-04-18 DIAGNOSIS — K8689 Other specified diseases of pancreas: Secondary | ICD-10-CM

## 2018-04-18 DIAGNOSIS — C251 Malignant neoplasm of body of pancreas: Secondary | ICD-10-CM | POA: Insufficient documentation

## 2018-04-18 NOTE — Telephone Encounter (Signed)
Scheduled appt per 9/5 los - gave patient AVS and calender per los.   

## 2018-04-18 NOTE — H&P (View-Only) (Signed)
[place] Telephone:(336) [phone]   Fax:(336) [phone]  OFFICE PROGRESS NOTE  [name], MD [place] [address]  DIAGNOSIS:  Stage IIIB (T1a, N3, M0) non-small cell lung cancer, adenocarcinoma with negative EGFR, ALK, ROS 1 but positive RET mutations presented with right middle lobe lung nodule in addition to mediastinal and left supraclavicular lymphadenopathy diagnosed in November 2016.  PRIOR THERAPY:  1) Systemic chemotherapy with carboplatin for AUC of 5 and Alimta 500 MG/M2 every 3 weeks status post 6 cycles with partial response. 2) Maintenance systemic chemotherapy with single agent Alimta 500 MG/M2 every 3 weeks, status post 15 cycles.  CURRENT THERAPY: Observation.  INTERVAL HISTORY: Sean Cooke 84 y.o. male returns to the clinic today for 3 months follow-up visit.  The patient is feeling fine today with no concerning complaints except for low back pain with radiation to the right leg.  He was seen by Dr. Gladstone Lighter and expected to have CT myelogram next week.  He denied having any chest pain, shortness breath, cough or hemoptysis.  He denied having any nausea, vomiting, diarrhea, constipation or abdominal pain.  He has no recent weight loss or night sweats.  The patient had a repeat CT scan of the chest performed recently and is here for evaluation and discussion of his discuss results.  MEDICAL HISTORY: Past Medical History:  Diagnosis Date  . Adenomatous colon polyp 05/2003  . Arthritis   . Degenerative joint disease   . Diverticulosis   . Duodenal diverticulum   . Encounter for antineoplastic chemotherapy 07/31/2015  . GERD (gastroesophageal reflux disease)   . Hemorrhoids   . Hiatal hernia   . Lung cancer ([place])   . Neuritis/radiculitis due to displacement of lumbar intervertebral disc   . Pneumonia   . Rib fracture     ALLERGIES:  is allergic to benzonatate; lyrica [pregabalin]; tizanidine; penicillins;  and tramadol.  MEDICATIONS:  Current Outpatient Medications  Medication Sig Dispense Refill  . acetaminophen (TYLENOL) 650 MG CR tablet Take 650 mg by mouth every 6 (six) hours as needed for pain or fever. Reported on 09/22/2015    . amLODipine (NORVASC) 5 MG tablet Take 5 mg by mouth daily.    Marland Kitchen antiseptic oral rinse (BIOTENE) LIQD 15 mLs by Mouth Rinse route as needed for dry mouth.    Marland Kitchen aspirin 81 MG tablet Take 81 mg by mouth at bedtime.     . Calcium Carb-Cholecalciferol (CALCIUM-VITAMIN D) 500-200 MG-UNIT tablet Take 2 tablets by mouth daily.    . cromolyn (OPTICROM) 4 % ophthalmic solution Place 4 drops into both eyes daily.    Marland Kitchen denosumab (PROLIA) 60 MG/ML SOLN injection Inject 60 mg into the skin every 6 (six) months. Administer in upper arm, thigh, or abdomen    . ferrous sulfate 325 (65 FE) MG tablet Take 325 mg by mouth 2 (two) times daily with a meal.    . guaiFENesin (MUCINEX) 600 MG 12 hr tablet Take 600 mg by mouth 2 (two) times daily as needed.    . hydrocortisone (CORTEF) 20 MG tablet     . lactobacillus acidophilus & bulgar (LACTINEX) chewable tablet Chew 1 tablet by mouth 2 (two) times daily. Store in [place], [place]    . lidocaine-prilocaine (EMLA) cream Apply 1 application topically as needed. 30 g 3  . loratadine (CLARITIN) 10 MG tablet Take 10 mg by mouth.    . losartan (COZAAR) 100 MG tablet Take 100  mg by mouth daily.    . meloxicam (MOBIC) 15 MG tablet Take 1 tablet by mouth daily.    . Multiple Vitamin (MULTIVITAMIN) tablet Take 1 tablet by mouth daily.    . Omega-3 Fatty Acids (FISH OIL) 1000 MG CAPS Take 1,000 mg by mouth daily.     Marland Kitchen oxybutynin (DITROPAN) 5 MG tablet Take 5 mg by mouth daily.    . pantoprazole (PROTONIX) 40 MG tablet Take 40 mg by mouth daily.    . polyethylene glycol (MIRALAX / GLYCOLAX) packet Take by mouth.    . Probiotic Product (ALIGN) 4 MG CAPS Take 1 capsule by mouth daily.    Marland Kitchen PROCTOSOL HC 2.5 % rectal cream INSERT INTO THE RECTUM TWO (2)  TIMES A DAY.  0  . saw palmetto 160 MG capsule Take 160 mg by mouth daily.    . Tamsulosin HCl (FLOMAX) 0.4 MG CAPS Take 0.4 mg by mouth daily.    . Turmeric 500 MG TABS Take by mouth.    . loperamide (IMODIUM) 2 MG capsule Take 2 mg by mouth as needed for diarrhea or loose stools. Reported on 03/01/2016     No current facility-administered medications for this visit.     SURGICAL HISTORY:  Past Surgical History:  Procedure Laterality Date  . CATARACT EXTRACTION     bilateral  . CHOLECYSTECTOMY  06/2003  . INGUINAL HERNIA REPAIR    . SPLENECTOMY  2002  . TRANSURETHRAL RESECTION OF PROSTATE      REVIEW OF SYSTEMS:  A comprehensive review of systems was negative except for: Musculoskeletal: positive for back pain   PHYSICAL EXAMINATION: General appearance: alert, cooperative and no distress Head: Normocephalic, without obvious abnormality, atraumatic Neck: no adenopathy, no JVD, supple, symmetrical, trachea midline and thyroid not enlarged, symmetric, no tenderness/mass/nodules Lymph nodes: Cervical, supraclavicular, and axillary nodes normal. Resp: clear to auscultation bilaterally Back: symmetric, no curvature. ROM normal. No CVA tenderness. Cardio: regular rate and rhythm, S1, S2 normal, no murmur, click, rub or gallop GI: soft, non-tender; bowel sounds normal; no masses,  no organomegaly Extremities: extremities normal, atraumatic, no cyanosis or edema  ECOG PERFORMANCE STATUS: 1 - Symptomatic but completely ambulatory  Blood pressure (!) 155/72, pulse 78, temperature 97.7 F (36.5 C), temperature source Oral, resp. rate 17, height '5\' 8"'$  (1.727 m), weight 161 lb 6.4 oz (73.2 kg), SpO2 99 %.  LABORATORY DATA: Lab Results  Component Value Date   WBC 9.9 04/16/2018   HGB 11.0 (L) 04/16/2018   HCT 31.6 (L) 04/16/2018   MCV 99.0 (H) 04/16/2018   PLT 401 (H) 04/16/2018      Chemistry      Component Value Date/Time   NA 130 (L) 04/16/2018 0849   NA 133 (L) 06/15/2017 0816    K 4.7 04/16/2018 0849   K 4.1 06/15/2017 0816   CL 97 (L) 04/16/2018 0849   CO2 25 04/16/2018 0849   CO2 27 06/15/2017 0816   BUN 9 04/16/2018 0849   BUN 9.8 06/15/2017 0816   CREATININE 0.68 04/16/2018 0849   CREATININE 0.7 06/15/2017 0816      Component Value Date/Time   CALCIUM 10.2 04/16/2018 0849   CALCIUM 10.2 06/15/2017 0816   ALKPHOS 65 04/16/2018 0849   ALKPHOS 65 06/15/2017 0816   AST 21 04/16/2018 0849   AST 23 06/15/2017 0816   ALT 17 04/16/2018 0849   ALT 22 06/15/2017 0816   BILITOT 0.7 04/16/2018 0849   BILITOT 0.64 06/15/2017 0816  RADIOGRAPHIC STUDIES: Ct Chest W Contrast  Result Date: 04/16/2018 CLINICAL DATA:  Stage IIIB right middle lobe lung adenocarcinoma diagnosed 2016 status post chemotherapy. Interval observation. Restaging. EXAM: CT CHEST WITH CONTRAST TECHNIQUE: Multidetector CT imaging of the chest was performed during intravenous contrast administration. CONTRAST:  31m OMNIPAQUE IOHEXOL 300 MG/ML  SOLN COMPARISON:  01/14/2018 chest CT. FINDINGS: Cardiovascular: Normal heart size. Stable small pericardial effusion/thickening. Three-vessel coronary atherosclerosis. Right internal jugular MediPort terminates in the lower third of the SVC. Atherosclerotic nonaneurysmal thoracic aorta. Normal caliber pulmonary arteries. No central pulmonary emboli. Mediastinum/Nodes: No discrete thyroid nodules. Unremarkable esophagus. No pathologically enlarged axillary, mediastinal or hilar lymph nodes. Lungs/Pleura: No pneumothorax. No pleural effusion. Mild centrilobular emphysema. Posterior right lower lobe 5 mm solid pulmonary nodule (series 7/image 81), stable since 01/14/2018 using similar measurement technique. No acute consolidative airspace disease, lung masses or new significant pulmonary nodules. Stable curvilinear parenchymal band in the right middle lobe. Upper abdomen: Cholecystectomy. Simple 1.4 cm partially visualized renal cyst in the posterior upper  right kidney. Multiple stable splenules in the splenectomy bed in the left upper quadrant. Interval stability of 2.9 x 2.4 cm masslike soft tissue focus in junction of the pancreatic body and tail with associated atrophy of the pancreatic tail (series 2/image 149), which is new since 02/28/2016 CT abdomen/pelvis. Musculoskeletal: No aggressive appearing focal osseous lesions. Moderate gynecomastia, asymmetric to the right, stable. Moderate thoracic spondylosis. IMPRESSION: 1. Masslike 2.9 cm soft tissue focus at the junction of the pancreatic body and tail, with associated pancreatic tail atrophy. Although stable in the interval, this finding is new since 2017 CT abdomen study, and primary pancreatic adenocarcinoma cannot be excluded. MRI abdomen without and with IV contrast is recommended for further characterization. 2. No findings suspicious for local tumor recurrence in the right hemithorax or metastatic disease in the chest. Solitary 5 mm right lower lobe pulmonary nodule is stable. Continued chest CT surveillance is warranted. 3. Three-vessel coronary atherosclerosis. Aortic Atherosclerosis (ICD10-I70.0) and Emphysema (ICD10-J43.9). Electronically Signed   By: JIlona SorrelM.D.   On: 04/16/2018 10:27    ASSESSMENT AND PLAN:  This is a very pleasant 84years old white male with stage IIIB non-small cell lung cancer, adenocarcinoma status post induction systemic chemotherapy with carboplatin and Alimta followed by maintenance treatment with single agent Alimta status post 15 cycles and has been tolerating the treatment well except for fatigue and weakness. The patient is current on observation. He continues to do well with no concerning complaints. He had repeat CT scan of the chest performed recently.  I personally and independently reviewed the scan images and discussed the results with the patient today.  His a scan showed no concerning findings for disease recurrence of the lung but that showed  suspicious pancreatic mass concerning for primary pancreatic adenocarcinoma. I discussed the results with the patient and recommended for him to have MRI of the abdomen with and without contrast for further evaluation of the pancreatic mass and to rule out malignancy. I will see the patient back for follow-up visit in around 4 weeks for evaluation and discussion of his MRI results and further recommendation regarding his condition. For the back pain he is followed by Dr. GGladstone Lighter He was advised to call immediately if he has any concerning symptoms in the interval. The patient voices understanding of current disease status and treatment options and is in agreement with the current care plan. All questions were answered. The patient knows to call the clinic with any  problems, questions or concerns. We can certainly see the patient much sooner if necessary.  Disclaimer: This note was dictated with voice recognition software. Similar sounding words can inadvertently be transcribed and may not be corrected upon review.

## 2018-04-18 NOTE — Progress Notes (Signed)
[place] [phone]   [phone]  OFFICE PROGRESS NOTE  [address] Sean Cooke [place] [address]  DIAGNOSIS:  Stage IIIB (T1a, N3, M0) non-small cell lung cancer, adenocarcinoma with negative EGFR, ALK, ROS 1 but positive RET mutations presented with right middle lobe lung nodule in addition to mediastinal and left supraclavicular lymphadenopathy diagnosed in November 2016.  PRIOR THERAPY:  1) Systemic chemotherapy with carboplatin for AUC of 5 and Alimta 500 MG/M2 every 3 weeks status post 6 cycles with partial response. 2) Maintenance systemic chemotherapy with single agent Alimta 500 MG/M2 every 3 weeks, status post 15 cycles.  CURRENT THERAPY: Observation.  INTERVAL HISTORY: Sean Cooke 84 y.o. male returns to the clinic today for 3 months follow-up visit.  The patient is feeling fine today with no concerning complaints except for low back pain with radiation to the right leg.  He was seen by Dr. Gladstone Lighter and expected to have CT myelogram next week.  He denied having any chest pain, shortness breath, cough or hemoptysis.  He denied having any nausea, vomiting, diarrhea, constipation or abdominal pain.  He has no recent weight loss or night sweats.  The patient had a repeat CT scan of the chest performed recently and is here for evaluation and discussion of his discuss results.  MEDICAL HISTORY: Past Medical History:  Diagnosis Date  . Adenomatous colon polyp 05/2003  . Arthritis   . Degenerative joint disease   . Diverticulosis   . Duodenal diverticulum   . Encounter for antineoplastic chemotherapy 07/31/2015  . GERD (gastroesophageal reflux disease)   . Hemorrhoids   . Hiatal hernia   . Lung cancer ([place])   . Neuritis/radiculitis due to displacement of lumbar intervertebral disc   . Pneumonia   . Rib fracture     ALLERGIES:  is allergic to benzonatate; lyrica [pregabalin]; tizanidine; penicillins;  and tramadol.  MEDICATIONS:  Current Outpatient Medications  Medication Sig Dispense Refill  . acetaminophen (TYLENOL) 650 MG CR tablet Take 650 mg by mouth every 6 (six) hours as needed for pain or fever. Reported on 09/22/2015    . amLODipine (NORVASC) 5 MG tablet Take 5 mg by mouth daily.    Marland Kitchen antiseptic oral rinse (BIOTENE) LIQD 15 mLs by Mouth Rinse route as needed for dry mouth.    Marland Kitchen aspirin 81 MG tablet Take 81 mg by mouth at bedtime.     . Calcium Carb-Cholecalciferol (CALCIUM-VITAMIN D) 500-200 MG-UNIT tablet Take 2 tablets by mouth daily.    . cromolyn (OPTICROM) 4 % ophthalmic solution Place 4 drops into both eyes daily.    Marland Kitchen denosumab (PROLIA) 60 MG/ML SOLN injection Inject 60 mg into the skin every 6 (six) months. Administer in upper arm, thigh, or abdomen    . ferrous sulfate 325 (65 FE) MG tablet Take 325 mg by mouth 2 (two) times daily with a meal.    . guaiFENesin (MUCINEX) 600 MG 12 hr tablet Take 600 mg by mouth 2 (two) times daily as needed.    . hydrocortisone (CORTEF) 20 MG tablet     . lactobacillus acidophilus & bulgar (LACTINEX) chewable tablet Chew 1 tablet by mouth 2 (two) times daily. Store in [place]    . lidocaine-prilocaine (EMLA) cream Apply 1 application topically as needed. 30 g 3  . loratadine (CLARITIN) 10 MG tablet Take 10 mg by mouth.    . losartan (COZAAR) 100 MG tablet Take 100  mg by mouth daily.    . meloxicam (MOBIC) 15 MG tablet Take 1 tablet by mouth daily.    . Multiple Vitamin (MULTIVITAMIN) tablet Take 1 tablet by mouth daily.    . Omega-3 Fatty Acids (FISH OIL) 1000 MG CAPS Take 1,000 mg by mouth daily.     Marland Kitchen oxybutynin (DITROPAN) 5 MG tablet Take 5 mg by mouth daily.    . pantoprazole (PROTONIX) 40 MG tablet Take 40 mg by mouth daily.    . polyethylene glycol (MIRALAX / GLYCOLAX) packet Take by mouth.    . Probiotic Product (ALIGN) 4 MG CAPS Take 1 capsule by mouth daily.    Marland Kitchen PROCTOSOL HC 2.5 % rectal cream INSERT INTO THE RECTUM TWO (2)  TIMES A DAY.  0  . saw palmetto 160 MG capsule Take 160 mg by mouth daily.    . Tamsulosin HCl (FLOMAX) 0.4 MG CAPS Take 0.4 mg by mouth daily.    . Turmeric 500 MG TABS Take by mouth.    . loperamide (IMODIUM) 2 MG capsule Take 2 mg by mouth as needed for diarrhea or loose stools. Reported on 03/01/2016     No current facility-administered medications for this visit.     SURGICAL HISTORY:  Past Surgical History:  Procedure Laterality Date  . CATARACT EXTRACTION     bilateral  . CHOLECYSTECTOMY  06/2003  . INGUINAL HERNIA REPAIR    . SPLENECTOMY  2002  . TRANSURETHRAL RESECTION OF PROSTATE      REVIEW OF SYSTEMS:  A comprehensive review of systems was negative except for: Musculoskeletal: positive for back pain   PHYSICAL EXAMINATION: General appearance: alert, cooperative and no distress Head: Normocephalic, without obvious abnormality, atraumatic Neck: no adenopathy, no JVD, supple, symmetrical, trachea midline and thyroid not enlarged, symmetric, no tenderness/mass/nodules Lymph nodes: Cervical, supraclavicular, and axillary nodes normal. Resp: clear to auscultation bilaterally Back: symmetric, no curvature. ROM normal. No CVA tenderness. Cardio: regular rate and rhythm, S1, S2 normal, no murmur, click, rub or gallop GI: soft, non-tender; bowel sounds normal; no masses,  no organomegaly Extremities: extremities normal, atraumatic, no cyanosis or edema  ECOG PERFORMANCE STATUS: 1 - Symptomatic but completely ambulatory  Blood pressure (!) 155/72, pulse 78, temperature 97.7 F (36.5 C), temperature source Oral, resp. rate 17, height '5\' 8"'$  (1.727 m), weight 161 lb 6.4 oz (73.2 kg), SpO2 99 %.  LABORATORY DATA: Lab Results  Component Value Date   WBC 9.9 04/16/2018   HGB 11.0 (L) 04/16/2018   HCT 31.6 (L) 04/16/2018   MCV 99.0 (H) 04/16/2018   PLT 401 (H) 04/16/2018      Chemistry      Component Value Date/Time   NA 130 (L) 04/16/2018 0849   NA 133 (L) 06/15/2017 0816    K 4.7 04/16/2018 0849   K 4.1 06/15/2017 0816   CL 97 (L) 04/16/2018 0849   CO2 25 04/16/2018 0849   CO2 27 06/15/2017 0816   BUN 9 04/16/2018 0849   BUN 9.8 06/15/2017 0816   CREATININE 0.68 04/16/2018 0849   CREATININE 0.7 06/15/2017 0816      Component Value Date/Time   CALCIUM 10.2 04/16/2018 0849   CALCIUM 10.2 06/15/2017 0816   ALKPHOS 65 04/16/2018 0849   ALKPHOS 65 06/15/2017 0816   AST 21 04/16/2018 0849   AST 23 06/15/2017 0816   ALT 17 04/16/2018 0849   ALT 22 06/15/2017 0816   BILITOT 0.7 04/16/2018 0849   BILITOT 0.64 06/15/2017 0816  RADIOGRAPHIC STUDIES: Ct Chest W Contrast  Result Date: 04/16/2018 CLINICAL DATA:  Stage IIIB right middle lobe lung adenocarcinoma diagnosed 2016 status post chemotherapy. Interval observation. Restaging. EXAM: CT CHEST WITH CONTRAST TECHNIQUE: Multidetector CT imaging of the chest was performed during intravenous contrast administration. CONTRAST:  72m OMNIPAQUE IOHEXOL 300 MG/ML  SOLN COMPARISON:  01/14/2018 chest CT. FINDINGS: Cardiovascular: Normal heart size. Stable small pericardial effusion/thickening. Three-vessel coronary atherosclerosis. Right internal jugular MediPort terminates in the lower third of the SVC. Atherosclerotic nonaneurysmal thoracic aorta. Normal caliber pulmonary arteries. No central pulmonary emboli. Mediastinum/Nodes: No discrete thyroid nodules. Unremarkable esophagus. No pathologically enlarged axillary, mediastinal or hilar lymph nodes. Lungs/Pleura: No pneumothorax. No pleural effusion. Mild centrilobular emphysema. Posterior right lower lobe 5 mm solid pulmonary nodule (series 7/image 81), stable since 01/14/2018 using similar measurement technique. No acute consolidative airspace disease, lung masses or new significant pulmonary nodules. Stable curvilinear parenchymal band in the right middle lobe. Upper abdomen: Cholecystectomy. Simple 1.4 cm partially visualized renal cyst in the posterior upper  right kidney. Multiple stable splenules in the splenectomy bed in the left upper quadrant. Interval stability of 2.9 x 2.4 cm masslike soft tissue focus in junction of the pancreatic body and tail with associated atrophy of the pancreatic tail (series 2/image 149), which is new since 02/28/2016 CT abdomen/pelvis. Musculoskeletal: No aggressive appearing focal osseous lesions. Moderate gynecomastia, asymmetric to the right, stable. Moderate thoracic spondylosis. IMPRESSION: 1. Masslike 2.9 cm soft tissue focus at the junction of the pancreatic body and tail, with associated pancreatic tail atrophy. Although stable in the interval, this finding is new since 2017 CT abdomen study, and primary pancreatic adenocarcinoma cannot be excluded. MRI abdomen without and with IV contrast is recommended for further characterization. 2. No findings suspicious for local tumor recurrence in the right hemithorax or metastatic disease in the chest. Solitary 5 mm right lower lobe pulmonary nodule is stable. Continued chest CT surveillance is warranted. 3. Three-vessel coronary atherosclerosis. Aortic Atherosclerosis (ICD10-I70.0) and Emphysema (ICD10-J43.9). Electronically Signed   By: JIlona SorrelM.D.   On: 04/16/2018 10:27    ASSESSMENT AND PLAN:  This is a very pleasant 84years old white male with stage IIIB non-small cell lung cancer, adenocarcinoma status post induction systemic chemotherapy with carboplatin and Alimta followed by maintenance treatment with single agent Alimta status post 15 cycles and has been tolerating the treatment well except for fatigue and weakness. The patient is current on observation. He continues to do well with no concerning complaints. He had repeat CT scan of the chest performed recently.  I personally and independently reviewed the scan images and discussed the results with the patient today.  His a scan showed no concerning findings for disease recurrence of the lung but that showed  suspicious pancreatic mass concerning for primary pancreatic adenocarcinoma. I discussed the results with the patient and recommended for him to have MRI of the abdomen with and without contrast for further evaluation of the pancreatic mass and to rule out malignancy. I will see the patient back for follow-up visit in around 4 weeks for evaluation and discussion of his MRI results and further recommendation regarding his condition. For the back pain he is followed by Dr. GGladstone Lighter He was advised to call immediately if he has any concerning symptoms in the interval. The patient voices understanding of current disease status and treatment options and is in agreement with the current care plan. All questions were answered. The patient knows to call the clinic with any  problems, questions or concerns. We can certainly see the patient much sooner if necessary.  Disclaimer: This note was dictated with voice recognition software. Similar sounding words can inadvertently be transcribed and may not be corrected upon review.

## 2018-04-19 ENCOUNTER — Other Ambulatory Visit: Payer: PPO

## 2018-04-22 DIAGNOSIS — H903 Sensorineural hearing loss, bilateral: Secondary | ICD-10-CM | POA: Diagnosis not present

## 2018-04-23 ENCOUNTER — Ambulatory Visit
Admission: RE | Admit: 2018-04-23 | Discharge: 2018-04-23 | Disposition: A | Discharge status: Home / Self Care | Payer: Self-pay | Source: Ambulatory Visit | Attending: Orthopedic Surgery | Admitting: Orthopedic Surgery

## 2018-04-23 ENCOUNTER — Other Ambulatory Visit: Payer: Self-pay | Admitting: Orthopedic Surgery

## 2018-04-23 DIAGNOSIS — M5136 Other intervertebral disc degeneration, lumbar region: Secondary | ICD-10-CM

## 2018-04-24 ENCOUNTER — Ambulatory Visit
Admission: RE | Admit: 2018-04-24 | Discharge: 2018-04-24 | Disposition: A | Discharge status: Home / Self Care | Payer: PPO | Source: Ambulatory Visit | Attending: Orthopedic Surgery | Admitting: Orthopedic Surgery

## 2018-04-24 VITALS — BP 144/89 | HR 62

## 2018-04-24 DIAGNOSIS — M47817 Spondylosis without myelopathy or radiculopathy, lumbosacral region: Secondary | ICD-10-CM

## 2018-04-24 DIAGNOSIS — M5126 Other intervertebral disc displacement, lumbar region: Secondary | ICD-10-CM

## 2018-04-24 DIAGNOSIS — M48061 Spinal stenosis, lumbar region without neurogenic claudication: Secondary | ICD-10-CM | POA: Diagnosis not present

## 2018-04-24 MED ORDER — IOPAMIDOL (ISOVUE-M 200) INJECTION 41%
20.0000 mL | Freq: Once | INTRAMUSCULAR | Status: AC
  Administered 2018-04-24: 20 mL via INTRATHECAL

## 2018-04-24 MED ORDER — ONDANSETRON HCL 4 MG/2ML IJ SOLN: 4.0000 mg | Freq: Four times a day (QID) | INTRAMUSCULAR | Status: DC | PRN

## 2018-04-24 MED ORDER — DIAZEPAM 5 MG PO TABS: 5.0000 mg | ORAL_TABLET | Freq: Once | ORAL | Status: DC

## 2018-04-24 NOTE — Discharge Instructions (Signed)

## 2018-04-29 DIAGNOSIS — M5136 Other intervertebral disc degeneration, lumbar region: Secondary | ICD-10-CM | POA: Diagnosis not present

## 2018-04-30 ENCOUNTER — Other Ambulatory Visit: Payer: Self-pay | Admitting: Internal Medicine

## 2018-04-30 ENCOUNTER — Ambulatory Visit (HOSPITAL_COMMUNITY)
Admission: RE | Admit: 2018-04-30 | Discharge: 2018-04-30 | Disposition: A | Discharge status: Home / Self Care | Payer: PPO | Source: Ambulatory Visit | Attending: Internal Medicine | Admitting: Internal Medicine

## 2018-04-30 DIAGNOSIS — K439 Ventral hernia without obstruction or gangrene: Secondary | ICD-10-CM | POA: Diagnosis not present

## 2018-04-30 DIAGNOSIS — C786 Secondary malignant neoplasm of retroperitoneum and peritoneum: Secondary | ICD-10-CM | POA: Diagnosis not present

## 2018-04-30 DIAGNOSIS — K8689 Other specified diseases of pancreas: Secondary | ICD-10-CM

## 2018-04-30 DIAGNOSIS — K869 Disease of pancreas, unspecified: Secondary | ICD-10-CM | POA: Insufficient documentation

## 2018-04-30 DIAGNOSIS — I700 Atherosclerosis of aorta: Secondary | ICD-10-CM | POA: Insufficient documentation

## 2018-04-30 DIAGNOSIS — R932 Abnormal findings on diagnostic imaging of liver and biliary tract: Secondary | ICD-10-CM | POA: Diagnosis not present

## 2018-04-30 MED ORDER — GADOBUTROL 1 MMOL/ML IV SOLN
7.5000 mL | Freq: Once | INTRAVENOUS | Status: AC | PRN
  Administered 2018-04-30: 7 mL via INTRAVENOUS

## 2018-04-30 NOTE — Progress Notes (Unsigned)
referr

## 2018-05-02 ENCOUNTER — Other Ambulatory Visit: Payer: Self-pay

## 2018-05-02 DIAGNOSIS — K8689 Other specified diseases of pancreas: Secondary | ICD-10-CM

## 2018-05-10 ENCOUNTER — Telehealth: Payer: Self-pay | Admitting: Internal Medicine

## 2018-05-10 ENCOUNTER — Other Ambulatory Visit: Payer: Self-pay | Admitting: *Deleted

## 2018-05-10 DIAGNOSIS — C3411 Malignant neoplasm of upper lobe, right bronchus or lung: Secondary | ICD-10-CM

## 2018-05-10 NOTE — Telephone Encounter (Signed)
Scheduled appt per 9/27 sch message - pt is aware of appt ./

## 2018-05-10 NOTE — Telephone Encounter (Signed)
Patient left voicemail with question on date and time of appt.  Called patient back with information of appt.  Patient wanted to be transferred to the nurse with other questions about blood work.

## 2018-05-14 ENCOUNTER — Other Ambulatory Visit: Payer: Self-pay

## 2018-05-14 ENCOUNTER — Encounter (HOSPITAL_COMMUNITY): Payer: Self-pay | Admitting: *Deleted

## 2018-05-15 ENCOUNTER — Telehealth: Payer: Self-pay | Admitting: Internal Medicine

## 2018-05-15 NOTE — Telephone Encounter (Signed)
tried to call regarding voicemail I did leave a message

## 2018-05-16 ENCOUNTER — Other Ambulatory Visit: Payer: Self-pay

## 2018-05-16 ENCOUNTER — Ambulatory Visit (HOSPITAL_COMMUNITY)
Admission: RE | Admit: 2018-05-16 | Discharge: 2018-05-16 | Disposition: A | Discharge status: Home / Self Care | Payer: PPO | Source: Ambulatory Visit | Attending: Gastroenterology | Admitting: Gastroenterology

## 2018-05-16 ENCOUNTER — Ambulatory Visit (HOSPITAL_COMMUNITY): Payer: PPO | Admitting: Anesthesiology

## 2018-05-16 ENCOUNTER — Encounter (HOSPITAL_COMMUNITY)
Admission: RE | Disposition: A | Discharge status: Home / Self Care | Payer: Self-pay | Source: Ambulatory Visit | Attending: Gastroenterology

## 2018-05-16 ENCOUNTER — Encounter (HOSPITAL_COMMUNITY): Payer: Self-pay | Admitting: *Deleted

## 2018-05-16 DIAGNOSIS — K449 Diaphragmatic hernia without obstruction or gangrene: Secondary | ICD-10-CM | POA: Diagnosis not present

## 2018-05-16 DIAGNOSIS — C251 Malignant neoplasm of body of pancreas: Secondary | ICD-10-CM | POA: Diagnosis not present

## 2018-05-16 DIAGNOSIS — K219 Gastro-esophageal reflux disease without esophagitis: Secondary | ICD-10-CM | POA: Insufficient documentation

## 2018-05-16 DIAGNOSIS — Z880 Allergy status to penicillin: Secondary | ICD-10-CM | POA: Diagnosis not present

## 2018-05-16 DIAGNOSIS — K869 Disease of pancreas, unspecified: Secondary | ICD-10-CM | POA: Diagnosis present

## 2018-05-16 DIAGNOSIS — Z7982 Long term (current) use of aspirin: Secondary | ICD-10-CM | POA: Diagnosis not present

## 2018-05-16 DIAGNOSIS — I10 Essential (primary) hypertension: Secondary | ICD-10-CM | POA: Diagnosis not present

## 2018-05-16 DIAGNOSIS — K8689 Other specified diseases of pancreas: Secondary | ICD-10-CM | POA: Diagnosis not present

## 2018-05-16 DIAGNOSIS — R933 Abnormal findings on diagnostic imaging of other parts of digestive tract: Secondary | ICD-10-CM | POA: Diagnosis not present

## 2018-05-16 DIAGNOSIS — M1990 Unspecified osteoarthritis, unspecified site: Secondary | ICD-10-CM | POA: Diagnosis not present

## 2018-05-16 DIAGNOSIS — Z87891 Personal history of nicotine dependence: Secondary | ICD-10-CM | POA: Insufficient documentation

## 2018-05-16 HISTORY — PX: EUS: SHX5427

## 2018-05-16 HISTORY — PX: FINE NEEDLE ASPIRATION: SHX5430

## 2018-05-16 SURGERY — UPPER ENDOSCOPIC ULTRASOUND (EUS) RADIAL
Anesthesia: Monitor Anesthesia Care

## 2018-05-16 MED ORDER — PROPOFOL 10 MG/ML IV BOLUS
INTRAVENOUS | Status: DC | PRN
  Administered 2018-05-16 (×15): 20 mg via INTRAVENOUS
  Administered 2018-05-16: 10 mg via INTRAVENOUS
  Administered 2018-05-16 (×3): 20 mg via INTRAVENOUS
  Administered 2018-05-16: 10 mg via INTRAVENOUS
  Administered 2018-05-16 (×4): 20 mg via INTRAVENOUS

## 2018-05-16 MED ORDER — PROPOFOL 10 MG/ML IV BOLUS
INTRAVENOUS | Status: AC
  Filled 2018-05-16: qty 40

## 2018-05-16 MED ORDER — SODIUM CHLORIDE 0.9 % IV SOLN: INTRAVENOUS | Status: DC

## 2018-05-16 MED ORDER — PROPOFOL 10 MG/ML IV BOLUS
INTRAVENOUS | Status: AC
  Filled 2018-05-16: qty 20

## 2018-05-16 MED ORDER — LACTATED RINGERS IV SOLN
INTRAVENOUS | Status: DC
  Administered 2018-05-16: 08:00:00 via INTRAVENOUS

## 2018-05-16 MED ORDER — LIDOCAINE 2% (20 MG/ML) 5 ML SYRINGE
INTRAMUSCULAR | Status: DC | PRN
  Administered 2018-05-16: 40 mg via INTRAVENOUS

## 2018-05-16 NOTE — Anesthesia Procedure Notes (Signed)
Procedure Name: MAC Date/Time: 05/16/2018 8:17 AM Performed by: Cynda Familia, CRNA Pre-anesthesia Checklist: Patient identified, Emergency Drugs available, Suction available, Timeout performed and Patient being monitored Patient Re-evaluated:Patient Re-evaluated prior to induction Oxygen Delivery Method: Nasal cannula Placement Confirmation: positive ETCO2 and breath sounds checked- equal and bilateral Dental Injury: Teeth and Oropharynx as per pre-operative assessment  Comments: Bite block by GI tech-- adjusted by Ardis Hughs

## 2018-05-16 NOTE — Op Note (Addendum)
[facility] Patient Name: Sean Cooke Procedure Date: 05/16/2018 MRN: [id number] Attending MD: Milus Banister , MD Date of Birth: Feb 21, 1927 CSN: [id number] Age: 84 Admit Type: Outpatient Procedure:                Upper EUS Indications:              Enlarging mass in pancreatic body/tail in setting                            of known Stage IIIB NSCLung Cancer (adeno) Providers:                Milus Banister, MD, Cleda Daub, RN, Elspeth Cho Tech., Technician, Glenis Smoker, CRNA Referring MD:             Curt Bears MD Medicines:                Monitored Anesthesia Care Complications:            No immediate complications. Estimated blood loss:                            None. Estimated Blood Loss:     Estimated blood loss: none. Procedure:                Pre-Anesthesia Assessment:                           - Prior to the procedure, a History and Physical                            was performed, and patient medications and                            allergies were reviewed. The patient's tolerance of                            previous anesthesia was also reviewed. The risks                            and benefits of the procedure and the sedation                            options and risks were discussed with the patient.                            All questions were answered, and informed consent                            was obtained. Prior Anticoagulants: The patient has                            taken no previous anticoagulant or antiplatelet  agents. ASA Grade Assessment: III - A patient with                            severe systemic disease. After reviewing the risks                            and benefits, the patient was deemed in                            satisfactory condition to undergo the procedure.                           After obtaining informed consent, the endoscope was                            passed under direct vision. Throughout the                            procedure, the patient's blood pressure, pulse, and                            oxygen saturations were monitored continuously. The                            [id number] ([id number]) Olympus Radial EUS was                            introduced through the mouth, and advanced to the                            second part of duodenum. The [id number] ([id number])                            Olympus Linear EUS was introduced through the                            mouth, and advanced to the second part of duodenum.                            The upper EUS was accomplished without difficulty.                            The patient tolerated the procedure well. Scope In: Scope Out: Findings:      ENDOSCOPIC FINDING: :      The examined esophagus was endoscopically normal.      Small amount of retained solid food in the stomach, othewise the stomach       was normal.      The examined duodenum was endoscopically normal.      ENDOSONOGRAPHIC FINDING: :      1. An irregular mass was identified in the pancreatic body/tail. The       mass was hypoechoic and heterogenous. The mass measured 29 mm in maximal       cross-sectional diameter. The endosonographic borders were  poorly-defined. The upstream pancreatic duct was dilated to 7.5 mm. The       mass clearly invades the splenic vessels but no other major vessels.       Fine needle aspiration for cytology was performed. Color Doppler imaging       was utilized prior to needle puncture to confirm a lack of significant       vascular structures within the needle path. Four passes were made with       the 25 gauge needle using a transgastric approach. A cytotechnologist       was present to evaluate the adequacy of the specimen. Final cytology       results are pending.      2. The pancreatic parenchyma was otherwise normal.      3. No peripancreatic  adenopathy      4. CBD was normal, non-dilated.      5. Limited views of the liver, spleen were normal. Impression:               - 2.9cm mass in the pancreatic body/tail that                            clearly involves splenic vessels but no other major                            vessels and causes upstream main pancreatic duct                            obstruction, dilation. The mass was sampled with                            FNA and preliminary cytology review is + for                            malignancy (adenocarcinoma). Await special stains                            to determine pancreatic vs. lung origin. Moderate Sedation:      N/A- Per Anesthesia Care Recommendation:           - Discharge patient to home (ambulatory). Procedure Code(s):        --- Professional ---                           [phone], Esophagogastroduodenoscopy, flexible,                            transoral; with transendoscopic ultrasound-guided                            intramural or transmural fine needle                            aspiration/biopsy(s), (includes endoscopic                            ultrasound examination limited to the esophagus,  stomach or duodenum, and adjacent structures) Diagnosis Code(s):        --- Professional ---                           K86.89, Other specified diseases of pancreas                           R93.3, Abnormal findings on diagnostic imaging of                            other parts of digestive tract CPT copyright 2017 American [employer] Association. All rights reserved. The codes documented in this report are preliminary and upon coder review may  be revised to meet current compliance requirements. Milus Banister, MD 05/16/2018 9:14:39 AM This report has been signed electronically. Number of Addenda: 0

## 2018-05-16 NOTE — Discharge Instructions (Signed)
Esophagogastroduodenoscopy, Care After °Refer to this sheet in the next few weeks. These instructions provide you with information about caring for yourself after your procedure. Your health care provider may also give you more specific instructions. Your treatment has been planned according to current medical practices, but problems sometimes occur. Call your health care provider if you have any problems or questions after your procedure. °What can I expect after the procedure? °After the procedure, it is common to have: °· A sore throat. °· Nausea. °· Bloating. °· Dizziness. °· Fatigue. ° °Follow these instructions at home: °· Do not eat or drink anything until the numbing medicine (local anesthetic) has worn off and your gag reflex has returned. You will know that the local anesthetic has worn off when you can swallow comfortably. °· Do not drive for 24 hours if you received a medicine to help you relax (sedative). °· If your health care provider took a tissue sample for testing during the procedure, make sure to get your test results. This is your responsibility. Ask your health care provider or the department performing the test when your results will be ready. °· Keep all follow-up visits as told by your health care provider. This is important. °Contact a health care provider if: °· You cannot stop coughing. °· You are not urinating. °· You are urinating less than usual. °Get help right away if: °· You have trouble swallowing. °· You cannot eat or drink. °· You have throat or chest pain that gets worse. °· You are dizzy or light-headed. °· You faint. °· You have nausea or vomiting. °· You have chills. °· You have a fever. °· You have severe abdominal pain. °· You have black, tarry, or bloody stools. °This information is not intended to replace advice given to you by your health care provider. Make sure you discuss any questions you have with your health care provider. °Document Released: 07/17/2012 Document  Revised: 01/06/2016 Document Reviewed: 06/24/2015 °Elsevier Interactive Patient Education © 2018 [employer] ° °

## 2018-05-16 NOTE — Transfer of Care (Signed)
Immediate Anesthesia Transfer of Care Note  Patient: Sean Cooke  Procedure(s) Performed: UPPER ENDOSCOPIC ULTRASOUND (EUS) RADIAL (N/A ) FINE NEEDLE ASPIRATION (FNA) LINEAR (N/A )  Patient Location: PACU and Endoscopy Unit  Anesthesia Type:MAC  Level of Consciousness: sedated  Airway & Oxygen Therapy: Patient Spontanous Breathing and Patient connected to nasal cannula oxygen  Post-op Assessment: Report given to RN and Post -op Vital signs reviewed and stable  Post vital signs: Reviewed and stable  Last Vitals:  Vitals Value Taken Time  BP    Temp    Pulse 64 05/16/2018  9:17 AM  Resp 14 05/16/2018  9:17 AM  SpO2 92 % 05/16/2018  9:17 AM  Vitals shown include unvalidated device data.  Last Pain:  Vitals:   05/16/18 0740  TempSrc: Oral  PainSc: 10-Worst pain ever         Complications: No apparent anesthesia complications

## 2018-05-16 NOTE — Interval H&P Note (Signed)
History and Physical Interval Note:  05/16/2018 8:03 AM  Sean Cooke  has presented today for surgery, with the diagnosis of pancreatic mass  The various methods of treatment have been discussed with the patient and family. After consideration of risks, benefits and other options for treatment, the patient has consented to  Procedure(s): UPPER ENDOSCOPIC ULTRASOUND (EUS) RADIAL (N/A) as a surgical intervention .  The patient's history has been reviewed, patient examined, no change in status, stable for surgery.  I have reviewed the patient's chart and labs.  Questions were answered to the patient's satisfaction.     Milus Banister

## 2018-05-16 NOTE — Anesthesia Preprocedure Evaluation (Addendum)
Anesthesia Evaluation  Patient identified by MRN, date of birth, ID band Patient awake    Reviewed: Allergy & Precautions, NPO status , Patient's Chart, lab work & pertinent test results  Airway Mallampati: II  TM Distance: >3 FB Neck ROM: Full    Dental  (+) Dental Advisory Given, Missing   Pulmonary former smoker,  Lung cancer    Pulmonary exam normal breath sounds clear to auscultation       Cardiovascular hypertension, Pt. on medications Normal cardiovascular exam Rhythm:Regular Rate:Normal     Neuro/Psych  Neuromuscular disease negative psych ROS   GI/Hepatic GERD  Medicated,Pancreatic mass   Endo/Other  negative endocrine ROS  Renal/GU negative Renal ROS     Musculoskeletal  (+) Arthritis ,   Abdominal   Peds  Hematology negative hematology ROS (+)   Anesthesia Other Findings Day of surgery medications reviewed with the patient.  Reproductive/Obstetrics                            Anesthesia Physical Anesthesia Plan  ASA: IV  Anesthesia Plan: MAC   Post-op Pain Management:    Induction: Intravenous  PONV Risk Score and Plan: 1 and Propofol infusion and Treatment may vary due to age or medical condition  Airway Management Planned: Natural Airway and Simple Face Mask  Additional Equipment:   Intra-op Plan:   Post-operative Plan:   Informed Consent: I have reviewed the patients History and Physical, chart, labs and discussed the procedure including the risks, benefits and alternatives for the proposed anesthesia with the patient or authorized representative who has indicated his/her understanding and acceptance.   Dental advisory given  Plan Discussed with: CRNA and Anesthesiologist  Anesthesia Plan Comments:         Anesthesia Quick Evaluation

## 2018-05-17 ENCOUNTER — Encounter (HOSPITAL_COMMUNITY): Payer: Self-pay | Admitting: Gastroenterology

## 2018-05-17 NOTE — Anesthesia Postprocedure Evaluation (Signed)
Anesthesia Post Note  Patient: Sean Cooke  Procedure(s) Performed: UPPER ENDOSCOPIC ULTRASOUND (EUS) RADIAL (N/A ) FINE NEEDLE ASPIRATION (FNA) LINEAR (N/A )     Patient location during evaluation: PACU Anesthesia Type: MAC Level of consciousness: awake and alert, awake and oriented Pain management: pain level controlled Vital Signs Assessment: post-procedure vital signs reviewed and stable Respiratory status: spontaneous breathing, nonlabored ventilation and respiratory function stable Cardiovascular status: stable and blood pressure returned to baseline Postop Assessment: no apparent nausea or vomiting Anesthetic complications: no    Last Vitals:  Vitals:   05/16/18 0950 05/16/18 1000  BP: (!) 176/66 (!) 186/66  Pulse: (!) 56 (!) 59  Resp: (!) 26 17  Temp:    SpO2: 92% 97%    Last Pain:  Vitals:   05/16/18 0930  TempSrc:   PainSc: 0-No pain                 Catalina Gravel

## 2018-05-20 ENCOUNTER — Inpatient Hospital Stay: Payer: PPO

## 2018-05-20 ENCOUNTER — Inpatient Hospital Stay: Payer: PPO | Admitting: *Deleted

## 2018-05-20 ENCOUNTER — Inpatient Hospital Stay: Payer: PPO | Attending: Internal Medicine | Admitting: Internal Medicine

## 2018-05-20 ENCOUNTER — Telehealth: Payer: Self-pay | Admitting: Internal Medicine

## 2018-05-20 ENCOUNTER — Encounter: Payer: Self-pay | Admitting: Internal Medicine

## 2018-05-20 VITALS — BP 149/70 | HR 92 | Temp 97.8°F | Resp 14 | Ht 68.0 in | Wt 165.6 lb

## 2018-05-20 DIAGNOSIS — Z5111 Encounter for antineoplastic chemotherapy: Secondary | ICD-10-CM

## 2018-05-20 DIAGNOSIS — K8689 Other specified diseases of pancreas: Secondary | ICD-10-CM

## 2018-05-20 DIAGNOSIS — C250 Malignant neoplasm of head of pancreas: Secondary | ICD-10-CM | POA: Diagnosis not present

## 2018-05-20 DIAGNOSIS — Z79899 Other long term (current) drug therapy: Secondary | ICD-10-CM | POA: Diagnosis not present

## 2018-05-20 DIAGNOSIS — C786 Secondary malignant neoplasm of retroperitoneum and peritoneum: Secondary | ICD-10-CM | POA: Insufficient documentation

## 2018-05-20 DIAGNOSIS — C3411 Malignant neoplasm of upper lobe, right bronchus or lung: Secondary | ICD-10-CM | POA: Diagnosis not present

## 2018-05-20 DIAGNOSIS — Z9221 Personal history of antineoplastic chemotherapy: Secondary | ICD-10-CM | POA: Insufficient documentation

## 2018-05-20 LAB — RESEARCH LABS

## 2018-05-20 NOTE — Progress Notes (Signed)
[facility] [phone]) [phone]   [phone]  OFFICE PROGRESS NOTE  Sean Cooke, [place] [address] Kristeen Mans [address] [place] [address]  DIAGNOSIS:   1) newly diagnosed pancreatic adenocarcinoma in September 2019 2) Stage IIIB (T1a, N3, M0) non-small cell lung cancer, adenocarcinoma with negative EGFR, ALK, ROS 1 but positive RET mutations presented with right middle lobe lung nodule in addition to mediastinal and left supraclavicular lymphadenopathy diagnosed in November 2016.  PRIOR THERAPY:  1) Systemic chemotherapy with carboplatin for AUC of 5 and Alimta 500 MG/M2 every 3 weeks status post 6 cycles with partial response. 2) Maintenance systemic chemotherapy with single agent Alimta 500 MG/M2 every 3 weeks, status post 15 cycles.  CURRENT THERAPY: Observation.  INTERVAL HISTORY: Sean Cooke 84 y.o. male returns to the clinic today for follow-up visit.  The patient is feeling fine today with no concerning complaints except for the chronic back pain.  He is scheduled to see Dr. Gladstone Lighter tomorrow for discussion of surgical intervention for his large right paracentral/subarticular disc extrusion at L4-5 with mild to moderate right neural foraminal stenosis.  The patient was found on the recent CT scan of the chest followed by MRI of the abdomen to have an early hypoenhancing, late peripherally hyperenhancing lesion within the pancreatic body which measured 3.3 x 2.5 cm.  This causes upstream atrophy and duct dilatation.  The patient was referred to Dr. Ardis Hughs and he underwent endoscopic ultrasound and biopsy of the pancreatic mass and the final pathology was consistent with adenocarcinoma.  He is here today for evaluation and recommendation regarding management of his condition.  MEDICAL HISTORY: Past Medical History:  Diagnosis Date  . Adenomatous colon polyp 05/2003  . Arthritis   . Degenerative joint disease   . Diverticulosis   . Duodenal  diverticulum   . Encounter for antineoplastic chemotherapy 07/31/2015  . GERD (gastroesophageal reflux disease)   . Hemorrhoids   . Hiatal hernia   . Lung cancer ([place])   . Neuritis/radiculitis due to displacement of lumbar intervertebral disc   . Pneumonia   . Rib fracture     ALLERGIES:  is allergic to benzonatate; lyrica [pregabalin]; gabapentin; tizanidine; penicillins; and tramadol.  MEDICATIONS:  Current Outpatient Medications  Medication Sig Dispense Refill  . acetaminophen (TYLENOL) 650 MG CR tablet Take 650 mg by mouth every 6 (six) hours as needed for pain or fever. Reported on 09/22/2015    . amLODipine (NORVASC) 5 MG tablet Take 5 mg by mouth every evening.     Marland Kitchen antiseptic oral rinse (BIOTENE) LIQD 15 mLs by Mouth Rinse route 5 (five) times daily as needed for dry mouth.     Marland Kitchen aspirin 81 MG tablet Take 81 mg by mouth at bedtime.     . Calcium Carb-Cholecalciferol (CALCIUM-VITAMIN D) 500-200 MG-UNIT tablet Take 1 tablet by mouth 2 (two) times daily. Morning & after supper    . calcium-vitamin D (OSCAL WITH D) 500-200 MG-UNIT TABS tablet Take by mouth.    . cromolyn (OPTICROM) 4 % ophthalmic solution Place 1 drop into both eyes 4 (four) times daily. Morning, noon, 1800, & bedtime.    Marland Kitchen denosumab (PROLIA) 60 MG/ML SOLN injection Inject 60 mg into the skin every 6 (six) months. Administer in upper arm, thigh, or abdomen    . ferrous sulfate 325 (65 FE) MG tablet Take 325 mg by mouth 2 (two) times daily with a meal. Morning & after supper    .  guaiFENesin (MUCINEX) 600 MG 12 hr tablet Take 600 mg by mouth 2 (two) times daily.     Marland Kitchen HYDROcodone-acetaminophen (NORCO/VICODIN) 5-325 MG tablet Take 1 tablet by mouth every 12 (twelve) hours.  0  . lactobacillus acidophilus & bulgar (LACTINEX) chewable tablet Chew 1 tablet by mouth daily. Store in [place], [place]     . lidocaine-prilocaine (EMLA) cream Apply 1 application topically as needed. (Patient taking differently: Apply 1 application  topically daily as needed (back pain.). ) 30 g 3  . Liniments (SALONPAS ARTHRITIS PAIN RELIEF EX) Apply 1 patch topically daily as needed (pain.).    Marland Kitchen loperamide (IMODIUM) 2 MG capsule Take 2 mg by mouth as needed for diarrhea or loose stools. Reported on 03/01/2016    . loratadine (CLARITIN) 10 MG tablet Take 10 mg by mouth daily.     Marland Kitchen losartan (COZAAR) 100 MG tablet Take 100 mg by mouth daily.    . magic mouthwash SOLN Take 5-10 mLs by mouth 2 (two) times daily as needed for irritation.  0  . meloxicam (MOBIC) 15 MG tablet Take 15 mg by mouth daily.     . Multiple Vitamin (MULTIVITAMIN) tablet Take 1 tablet by mouth daily.    . Omega-3 Fatty Acids (FISH OIL) 1000 MG CAPS Take 1,000 mg by mouth daily.     Marland Kitchen oxybutynin (DITROPAN) 5 MG tablet Take 5 mg by mouth at bedtime.     . pantoprazole (PROTONIX) 40 MG tablet Take 40 mg by mouth daily.    . polyethylene glycol (MIRALAX / GLYCOLAX) packet Take 17 g by mouth daily as needed (constipation.).     Marland Kitchen Probiotic Product (ALIGN) 4 MG CAPS Take 4 mg by mouth daily.     Marland Kitchen PROCTOSOL HC 2.5 % rectal cream Place 1 application rectally 2 (two) times daily as needed (hemorrhoids).   0  . saw palmetto 160 MG capsule Take 160 mg by mouth daily.    . Tamsulosin HCl (FLOMAX) 0.4 MG CAPS Take 0.4 mg by mouth daily.    . Turmeric 500 MG TABS Take 500 mg by mouth daily after supper.      No current facility-administered medications for this visit.     SURGICAL HISTORY:  Past Surgical History:  Procedure Laterality Date  . CATARACT EXTRACTION     bilateral  . CHOLECYSTECTOMY  06/2003  . EUS N/A 05/16/2018   Procedure: UPPER ENDOSCOPIC ULTRASOUND (EUS) RADIAL;  Surgeon: Milus Banister, MD;  Location: [facility];  Service: Endoscopy;  Laterality: N/A;  . FINE NEEDLE ASPIRATION N/A 05/16/2018   Procedure: FINE NEEDLE ASPIRATION (FNA) LINEAR;  Surgeon: Milus Banister, MD;  Location: [facility];  Service: Endoscopy;  Laterality: N/A;  . INGUINAL HERNIA  REPAIR    . SPLENECTOMY  2002  . TRANSURETHRAL RESECTION OF PROSTATE      REVIEW OF SYSTEMS:  Constitutional: positive for fatigue Eyes: negative Ears, nose, mouth, throat, and face: negative Respiratory: negative Cardiovascular: negative Gastrointestinal: negative Genitourinary:negative Integument/breast: negative Hematologic/lymphatic: negative Musculoskeletal:positive for back pain Neurological: negative Behavioral/Psych: negative Endocrine: negative Allergic/Immunologic: negative   PHYSICAL EXAMINATION: General appearance: alert, cooperative and no distress Head: Normocephalic, without obvious abnormality, atraumatic Neck: no adenopathy, no JVD, supple, symmetrical, trachea midline and thyroid not enlarged, symmetric, no tenderness/mass/nodules Lymph nodes: Cervical, supraclavicular, and axillary nodes normal. Resp: clear to auscultation bilaterally Back: symmetric, no curvature. ROM normal. No CVA tenderness. Cardio: regular rate and rhythm, S1, S2 normal, no murmur, click, rub or gallop GI: soft,  non-tender; bowel sounds normal; no masses,  no organomegaly Extremities: extremities normal, atraumatic, no cyanosis or edema Neurologic: Alert and oriented X 3, normal strength and tone. Normal symmetric reflexes. Normal coordination and gait  ECOG PERFORMANCE STATUS: 1 - Symptomatic but completely ambulatory  Blood pressure (!) 149/70, pulse 92, temperature 97.8 F (36.6 C), temperature source Oral, resp. rate 14, height '5\' 8"'$  (1.727 m), weight 165 lb 9.6 oz (75.1 kg), SpO2 98 %.  LABORATORY DATA: Lab Results  Component Value Date   WBC 9.9 04/16/2018   HGB 11.0 (L) 04/16/2018   HCT 31.6 (L) 04/16/2018   MCV 99.0 (H) 04/16/2018   PLT 401 (H) 04/16/2018      Chemistry      Component Value Date/Time   NA 130 (L) 04/16/2018 0849   NA 133 (L) 06/15/2017 0816   K 4.7 04/16/2018 0849   K 4.1 06/15/2017 0816   CL 97 (L) 04/16/2018 0849   CO2 25 04/16/2018 0849   CO2  27 06/15/2017 0816   BUN 9 04/16/2018 0849   BUN 9.8 06/15/2017 0816   CREATININE 0.68 04/16/2018 0849   CREATININE 0.7 06/15/2017 0816      Component Value Date/Time   CALCIUM 10.2 04/16/2018 0849   CALCIUM 10.2 06/15/2017 0816   ALKPHOS 65 04/16/2018 0849   ALKPHOS 65 06/15/2017 0816   AST 21 04/16/2018 0849   AST 23 06/15/2017 0816   ALT 17 04/16/2018 0849   ALT 22 06/15/2017 0816   BILITOT 0.7 04/16/2018 0849   BILITOT 0.64 06/15/2017 0816       RADIOGRAPHIC STUDIES: Ct Lumbar Spine W Contrast  Result Date: 04/24/2018 CLINICAL DATA:  Right low back pain radiating to the lateral right thigh and knee. EXAM: LUMBAR MYELOGRAM FLUOROSCOPY TIME:  Radiation Exposure Index (as provided by the fluoroscopic device): 249.92 microGray*m^2 Fluoroscopy Time (in minutes and seconds):  32 seconds PROCEDURE: After thorough discussion of risks and benefits of the procedure including bleeding, infection, injury to nerves, blood vessels, adjacent structures as well as headache and CSF leak, written and oral informed consent was obtained. Consent was obtained by Dr. Logan Bores. Time out form was completed. Patient was positioned prone on the fluoroscopy table. Local anesthesia was provided with 1% lidocaine without epinephrine after prepped and draped in the usual sterile fashion. Puncture was performed at L2-3 using a 3 1/2 inch 22-gauge spinal needle via a left interlaminar approach. Using a single pass through the dura, the needle was placed within the thecal sac, with return of clear CSF. 15 mL of Isovue M-200 was injected into the thecal sac, with normal opacification of the nerve roots and cauda equina consistent with free flow within the subarachnoid space. I personally performed the lumbar puncture and administered the intrathecal contrast. I also personally supervised acquisition of the myelogram images. TECHNIQUE: Contiguous axial images were obtained through the Lumbar spine after the intrathecal  infusion of infusion. Coronal and sagittal reconstructions were obtained of the axial image sets. COMPARISON:  12/05/2017 lumbar spine MRI from [facility] FINDINGS: LUMBAR MYELOGRAM FINDINGS: There are 5 non rib-bearing lumbar type vertebrae. There is no significant listhesis with neutral, flexion, or extension positioning. Large ventral and right lateral extradural defects are present at L4-5 with spinal and prominent asymmetric right lateral recess stenosis affecting the right L5 nerve root. Smaller ventral extradural defects are present at L2-3 and L3-4 without evidence of high-grade stenosis. CT LUMBAR MYELOGRAM FINDINGS: Vertebral alignment is unchanged without significant listhesis. There is  minimal left convex curvature of the lower lumbar spine. Old right L1-L3 transverse process fractures are noted. Disc space narrowing is severe at L5-S1, moderate at L4-5, and mild at L2-3 and L3-4. Vacuum disc and degenerative endplate sclerosis are noted at L4-5 and L5-S1. There are SI joint degenerative changes including anterior osteophyte formation greater on the left. The conus medullaris terminates at L1. Cholecystectomy clips, abdominal aortic atherosclerosis, a nonobstructing 8 mm left renal calculus, an 11 mm right renal cyst, and colonic diverticulosis are noted. T11-12: Small, partially calcified central disc extrusion and mild facet spurring without stenosis, unchanged. T12-L1: Negative. L1-2: Mild facet hypertrophy without disc herniation or stenosis, unchanged. L2-3: Mild disc bulging, ligamentum flavum hypertrophy, and mild right and moderate left facet arthrosis result in mild bilateral lateral recess stenosis without significant spinal or neural foraminal stenosis, unchanged. L3-4: Circumferential disc bulging, ligamentum flavum hypertrophy, and mild right and moderate left facet arthrosis result in mild bilateral lateral recess stenosis and mild-to-moderate right and mild left neural foraminal  stenosis without significant spinal stenosis, unchanged. L4-5: There is a large right paracentral and subarticular disc extrusion, much larger than the protrusion on the prior MRI and resulting in severe right lateral recess stenosis with right L5 nerve root impingement. Circumferential disc bulging and facet and ligamentum flavum hypertrophy contribute to moderate left lateral recess stenosis, moderate spinal stenosis, and severe right and mild left neural foraminal stenosis with right L4 nerve root impingement. L5-S1: Disc bulging, endplate spurring, severe disc space height loss, and mild facet hypertrophy result in mild bilateral neural foraminal stenosis without spinal stenosis, unchanged. IMPRESSION: 1. Large right paracentral/subarticular disc extrusion at L4-5, much larger than on the prior MRI. Severe right lateral recess and right neural foraminal stenosis at this level. 2. Mild-to-moderate right neural foraminal stenosis and mild lateral recess stenosis at L3-4. 3. Mild lateral recess stenosis at L2-3. 4. Mild neural foraminal stenosis at L5-S1. 5.  Aortic Atherosclerosis (ICD10-I70.0). Electronically Signed   By: Logan Bores M.D.   On: 04/24/2018 16:02   Mr Abdomen W Wo Contrast  Result Date: 04/30/2018 CLINICAL DATA:  Possible pancreatic body mass on chest CT. History of lung cancer. EXAM: MRI ABDOMEN WITHOUT AND WITH CONTRAST TECHNIQUE: Multiplanar multisequence MR imaging of the abdomen was performed both before and after the administration of intravenous contrast. CONTRAST:  7 cc Gadavist COMPARISON:  Chest CT 04/16/2018 and most recent abdominal CT 02/28/2016. No prior abdominal MRI. FINDINGS: Portions of exam are mildly motion degraded. This primarily affects the respiratory triggered T2 weighted images. Lower chest: Normal heart size without pericardial or pleural effusion. Hepatobiliary: Normal liver. Cholecystectomy, without biliary ductal dilatation. Pancreas: The pancreas is relatively  diffusely atrophic with areas of borderline duct dilatation. This makes evaluation of secondary signs of obstructive mass challenging. However, MRI confirms the presence of an early hypoenhancing, late peripherally hyperenhancing lesion within the pancreatic body which measures 3.3 x 2.5 cm on image 21/6, image 31/1004, image 32/11004. This causes upstream atrophy and duct dilatation. No traversing pancreatic duct. No evidence of acute superimposed pancreatitis. Spleen: Splenectomy with left upper quadrant residual splenic tissue. Adrenals/Urinary Tract: Normal adrenal glands. An 8 mm upper pole left renal complex cyst. Normal right kidney. Stomach/Bowel: Proximal gastric underdistention. Grossly normal abdominal bowel loops. Vascular/Lymphatic: Aortic and branch vessel atherosclerosis. Suspect splenic vein involvement with tumor. For example, a diminutive splenic vein drains from the tumor on image 32/1002. This is in contrast to a larger traversing vein back on 10/15/2017. Patent portal vein.  No abdominal adenopathy. Other: No ascites. Left paramidline fat containing ventral abdominal wall hernia. Areas of subtle enhancing nodularity in this region, including on image 70/11004. Musculoskeletal: Right-sided L2 vertebral body hemangioma. IMPRESSION: 1. Pancreatic body mass is highly suspicious for adenocarcinoma. This is slowly enlarged back from the 10/15/2017 exam. Suspect splenic vein involvement, new since that study. 2. No typical findings of abdominal metastatic disease. 3. Fat containing ventral abdominal wall hernia. Areas of subtle enhancing nodularity within this region could be postoperative. An atypical appearance of peritoneal metastasis could look similar. If this 84 year old patient is a surgical candidate, PET may be informative. 4. Mild motion degradation. 5.  Aortic Atherosclerosis (ICD10-I70.0). Electronically Signed   By: Abigail Miyamoto M.D.   On: 04/30/2018 15:42   Mr 3d Recon At  Scanner  Result Date: 04/30/2018 CLINICAL DATA:  Possible pancreatic body mass on chest CT. History of lung cancer. EXAM: MRI ABDOMEN WITHOUT AND WITH CONTRAST TECHNIQUE: Multiplanar multisequence MR imaging of the abdomen was performed both before and after the administration of intravenous contrast. CONTRAST:  7 cc Gadavist COMPARISON:  Chest CT 04/16/2018 and most recent abdominal CT 02/28/2016. No prior abdominal MRI. FINDINGS: Portions of exam are mildly motion degraded. This primarily affects the respiratory triggered T2 weighted images. Lower chest: Normal heart size without pericardial or pleural effusion. Hepatobiliary: Normal liver. Cholecystectomy, without biliary ductal dilatation. Pancreas: The pancreas is relatively diffusely atrophic with areas of borderline duct dilatation. This makes evaluation of secondary signs of obstructive mass challenging. However, MRI confirms the presence of an early hypoenhancing, late peripherally hyperenhancing lesion within the pancreatic body which measures 3.3 x 2.5 cm on image 21/6, image 31/1004, image 32/11004. This causes upstream atrophy and duct dilatation. No traversing pancreatic duct. No evidence of acute superimposed pancreatitis. Spleen: Splenectomy with left upper quadrant residual splenic tissue. Adrenals/Urinary Tract: Normal adrenal glands. An 8 mm upper pole left renal complex cyst. Normal right kidney. Stomach/Bowel: Proximal gastric underdistention. Grossly normal abdominal bowel loops. Vascular/Lymphatic: Aortic and branch vessel atherosclerosis. Suspect splenic vein involvement with tumor. For example, a diminutive splenic vein drains from the tumor on image 32/1002. This is in contrast to a larger traversing vein back on 10/15/2017. Patent portal vein. No abdominal adenopathy. Other: No ascites. Left paramidline fat containing ventral abdominal wall hernia. Areas of subtle enhancing nodularity in this region, including on image 70/11004.  Musculoskeletal: Right-sided L2 vertebral body hemangioma. IMPRESSION: 1. Pancreatic body mass is highly suspicious for adenocarcinoma. This is slowly enlarged back from the 10/15/2017 exam. Suspect splenic vein involvement, new since that study. 2. No typical findings of abdominal metastatic disease. 3. Fat containing ventral abdominal wall hernia. Areas of subtle enhancing nodularity within this region could be postoperative. An atypical appearance of peritoneal metastasis could look similar. If this 84 year old patient is a surgical candidate, PET may be informative. 4. Mild motion degradation. 5.  Aortic Atherosclerosis (ICD10-I70.0). Electronically Signed   By: Abigail Miyamoto M.D.   On: 04/30/2018 15:42   Dg Myelography Lumbar Inj Lumbosacral  Result Date: 04/24/2018 CLINICAL DATA:  Right low back pain radiating to the lateral right thigh and knee. EXAM: LUMBAR MYELOGRAM FLUOROSCOPY TIME:  Radiation Exposure Index (as provided by the fluoroscopic device): 249.92 microGray*m^2 Fluoroscopy Time (in minutes and seconds):  32 seconds PROCEDURE: After thorough discussion of risks and benefits of the procedure including bleeding, infection, injury to nerves, blood vessels, adjacent structures as well as headache and CSF leak, written and oral informed consent was obtained. Consent was  obtained by Dr. Logan Bores. Time out form was completed. Patient was positioned prone on the fluoroscopy table. Local anesthesia was provided with 1% lidocaine without epinephrine after prepped and draped in the usual sterile fashion. Puncture was performed at L2-3 using a 3 1/2 inch 22-gauge spinal needle via a left interlaminar approach. Using a single pass through the dura, the needle was placed within the thecal sac, with return of clear CSF. 15 mL of Isovue M-200 was injected into the thecal sac, with normal opacification of the nerve roots and cauda equina consistent with free flow within the subarachnoid space. I personally  performed the lumbar puncture and administered the intrathecal contrast. I also personally supervised acquisition of the myelogram images. TECHNIQUE: Contiguous axial images were obtained through the Lumbar spine after the intrathecal infusion of infusion. Coronal and sagittal reconstructions were obtained of the axial image sets. COMPARISON:  12/05/2017 lumbar spine MRI from [facility] FINDINGS: LUMBAR MYELOGRAM FINDINGS: There are 5 non rib-bearing lumbar type vertebrae. There is no significant listhesis with neutral, flexion, or extension positioning. Large ventral and right lateral extradural defects are present at L4-5 with spinal and prominent asymmetric right lateral recess stenosis affecting the right L5 nerve root. Smaller ventral extradural defects are present at L2-3 and L3-4 without evidence of high-grade stenosis. CT LUMBAR MYELOGRAM FINDINGS: Vertebral alignment is unchanged without significant listhesis. There is minimal left convex curvature of the lower lumbar spine. Old right L1-L3 transverse process fractures are noted. Disc space narrowing is severe at L5-S1, moderate at L4-5, and mild at L2-3 and L3-4. Vacuum disc and degenerative endplate sclerosis are noted at L4-5 and L5-S1. There are SI joint degenerative changes including anterior osteophyte formation greater on the left. The conus medullaris terminates at L1. Cholecystectomy clips, abdominal aortic atherosclerosis, a nonobstructing 8 mm left renal calculus, an 11 mm right renal cyst, and colonic diverticulosis are noted. T11-12: Small, partially calcified central disc extrusion and mild facet spurring without stenosis, unchanged. T12-L1: Negative. L1-2: Mild facet hypertrophy without disc herniation or stenosis, unchanged. L2-3: Mild disc bulging, ligamentum flavum hypertrophy, and mild right and moderate left facet arthrosis result in mild bilateral lateral recess stenosis without significant spinal or neural foraminal stenosis,  unchanged. L3-4: Circumferential disc bulging, ligamentum flavum hypertrophy, and mild right and moderate left facet arthrosis result in mild bilateral lateral recess stenosis and mild-to-moderate right and mild left neural foraminal stenosis without significant spinal stenosis, unchanged. L4-5: There is a large right paracentral and subarticular disc extrusion, much larger than the protrusion on the prior MRI and resulting in severe right lateral recess stenosis with right L5 nerve root impingement. Circumferential disc bulging and facet and ligamentum flavum hypertrophy contribute to moderate left lateral recess stenosis, moderate spinal stenosis, and severe right and mild left neural foraminal stenosis with right L4 nerve root impingement. L5-S1: Disc bulging, endplate spurring, severe disc space height loss, and mild facet hypertrophy result in mild bilateral neural foraminal stenosis without spinal stenosis, unchanged. IMPRESSION: 1. Large right paracentral/subarticular disc extrusion at L4-5, much larger than on the prior MRI. Severe right lateral recess and right neural foraminal stenosis at this level. 2. Mild-to-moderate right neural foraminal stenosis and mild lateral recess stenosis at L3-4. 3. Mild lateral recess stenosis at L2-3. 4. Mild neural foraminal stenosis at L5-S1. 5.  Aortic Atherosclerosis (ICD10-I70.0). Electronically Signed   By: Logan Bores M.D.   On: 04/24/2018 16:02    ASSESSMENT AND PLAN:  This is a very pleasant 84 years  old white male with:  1) recently diagnosed pancreatic adenocarcinoma in September 2019.  I had a lengthy discussion with the patient today about his current condition and treatment options.  I discussed with the patient referral to Dr. Barry Dienes with [facility] for evaluation for surgical resection.  The patient is 7 but he is in good general condition.  If surgery is not an option, I would consider referring the patient to radiation oncology for  evaluation and curative radiotherapy to the pancreatic lesion. 2) stage IIIB non-small cell lung cancer, adenocarcinoma status post induction systemic chemotherapy with carboplatin and Alimta followed by maintenance treatment with single agent Alimta status post 15 cycles and has been tolerating the treatment well except for fatigue and weakness. The patient is current on observation. 3) for the low back pain, he is followed by Dr. Gladstone Lighter and he is expected to have a visit with him tomorrow for discussion of surgical options. I will see the patient back for follow-up visit in 2 months for evaluation and more detailed discussion of treatment options. He was advised to call immediately if he has any concerning symptoms in the interval. The patient voices understanding of current disease status and treatment options and is in agreement with the current care plan. All questions were answered. The patient knows to call the clinic with any problems, questions or concerns. We can certainly see the patient much sooner if necessary.  Disclaimer: This note was dictated with voice recognition software. Similar sounding words can inadvertently be transcribed and may not be corrected upon review.

## 2018-05-20 NOTE — Telephone Encounter (Signed)
Scheduled appt per 10/7 los - sent reminder letter in the mail with appt date and time and referral sent through [facility]>

## 2018-05-21 DIAGNOSIS — M48061 Spinal stenosis, lumbar region without neurogenic claudication: Secondary | ICD-10-CM | POA: Diagnosis not present

## 2018-05-22 ENCOUNTER — Encounter: Payer: Self-pay | Admitting: Cardiovascular Disease

## 2018-05-22 ENCOUNTER — Ambulatory Visit (INDEPENDENT_AMBULATORY_CARE_PROVIDER_SITE_OTHER): Payer: PPO | Admitting: Cardiovascular Disease

## 2018-05-22 VITALS — BP 142/78 | HR 83 | Ht 68.0 in | Wt 163.4 lb

## 2018-05-22 DIAGNOSIS — Z01818 Encounter for other preprocedural examination: Secondary | ICD-10-CM

## 2018-05-22 NOTE — Progress Notes (Signed)
05/22/2018 Sean Cooke   06/15/1927  [id number]  Primary Physician [name], PA-C Primary Cardiologist: Lorretta Harp MD Gar[facility], [place], [place]  HPI:  Sean Cooke is a 84 y.o. mildly overweight married Caucasian male with no children referred by Dr. Gladstone Lighter, orthopedic surgeon, for preoperative clearance prior to back surgery for ruptured disc.  His only cardiac risk factor is treated hypertension remote tobacco abuse.  Never had a heart attack or stroke.  Denies chest pain or shortness of breath.  He has been diagnosed with lung cancer and is gotten radiation therapy and chemotherapy.  He is recently been diagnosed with pancreatic mass as well.   Current Meds  Medication Sig  . acetaminophen (TYLENOL) 650 MG CR tablet Take 650 mg by mouth every 6 (six) hours as needed for pain or fever. Reported on 09/22/2015  . amLODipine (NORVASC) 5 MG tablet Take 5 mg by mouth every evening.   Marland Kitchen antiseptic oral rinse (BIOTENE) LIQD 15 mLs by Mouth Rinse route 5 (five) times daily as needed for dry mouth.   Marland Kitchen aspirin 81 MG tablet Take 81 mg by mouth at bedtime.   . Calcium Carb-Cholecalciferol (CALCIUM-VITAMIN D) 500-200 MG-UNIT tablet Take 1 tablet by mouth 2 (two) times daily. Morning & after supper  . calcium-vitamin D (OSCAL WITH D) 500-200 MG-UNIT TABS tablet Take by mouth.  . cromolyn (OPTICROM) 4 % ophthalmic solution Place 1 drop into both eyes 4 (four) times daily. Morning, noon, 1800, & bedtime.  Marland Kitchen denosumab (PROLIA) 60 MG/ML SOLN injection Inject 60 mg into the skin every 6 (six) months. Administer in upper arm, thigh, or abdomen  . ferrous sulfate 325 (65 FE) MG tablet Take 325 mg by mouth 2 (two) times daily with a meal. Morning & after supper  . guaiFENesin (MUCINEX) 600 MG 12 hr tablet Take 600 mg by mouth 2 (two) times daily.   Marland Kitchen lactobacillus acidophilus & bulgar (LACTINEX) chewable tablet Chew 1 tablet by mouth daily. Store in [address], [place]   .  lidocaine-prilocaine (EMLA) cream Apply 1 application topically as needed. (Patient taking differently: Apply 1 application topically daily as needed (back pain.). )  . Liniments (SALONPAS ARTHRITIS PAIN RELIEF EX) Apply 1 patch topically daily as needed (pain.).  Marland Kitchen loperamide (IMODIUM) 2 MG capsule Take 2 mg by mouth as needed for diarrhea or loose stools. Reported on 03/01/2016  . loratadine (CLARITIN) 10 MG tablet Take 10 mg by mouth daily.   Marland Kitchen losartan (COZAAR) 100 MG tablet Take 100 mg by mouth daily.  . magic mouthwash SOLN Take 5-10 mLs by mouth 2 (two) times daily as needed for irritation.  . meloxicam (MOBIC) 15 MG tablet Take 15 mg by mouth daily.   . Multiple Vitamin (MULTIVITAMIN) tablet Take 1 tablet by mouth daily.  . Omega-3 Fatty Acids (FISH OIL) 1000 MG CAPS Take 1,000 mg by mouth daily.   Marland Kitchen oxybutynin (DITROPAN) 5 MG tablet Take 5 mg by mouth at bedtime.   . pantoprazole (PROTONIX) 40 MG tablet Take 40 mg by mouth daily.  . polyethylene glycol (MIRALAX / GLYCOLAX) packet Take 17 g by mouth daily as needed (constipation.).   Marland Kitchen Probiotic Product (ALIGN) 4 MG CAPS Take 4 mg by mouth daily.   Marland Kitchen PROCTOSOL HC 2.5 % rectal cream Place 1 application rectally 2 (two) times daily as needed (hemorrhoids).   . saw palmetto 160 MG capsule Take 160 mg by mouth daily.  . Tamsulosin HCl (FLOMAX)  0.4 MG CAPS Take 0.4 mg by mouth daily.  . Turmeric 500 MG TABS Take 500 mg by mouth daily after supper.      Allergies  Allergen Reactions  . Benzonatate Other (See Comments)    dizziness  . Lyrica [Pregabalin] Diarrhea  . Gabapentin Other (See Comments)  . Tizanidine     Unknown, cannot remember what it did  . Penicillins Rash    REACTION: Rash Has patient had a PCN reaction causing immediate rash, facial/tongue/throat swelling, SOB or lightheadedness with hypotension: unknown Has patient had a PCN reaction causing severe rash involving mucus membranes or skin necrosis: unknown Has patient  had a PCN reaction that required hospitalization : unknown Has patient had a PCN reaction occurring within the last 10 years: no, childhood allergy If all of the above answers are "NO", then may proceed with Cephalosporin use.   . Tramadol Other (See Comments)    constipation    Social History   Socioeconomic History  . Marital status: Married    Spouse name: Not on file  . Number of children: 0  . Years of education: Not on file  . Highest education level: Not on file  Occupational History  . Occupation: retired  Scientific laboratory technician  . Financial resource strain: Not on file  . Food insecurity:    Worry: Not on file    Inability: Not on file  . Transportation needs:    Medical: Not on file    Non-medical: Not on file  Tobacco Use  . Smoking status: Former Smoker    Types: Cigarettes, Pipe    Last attempt to quit: 08/14/1988    Years since quitting: 29.7  . Smokeless tobacco: Never Used  Substance and Sexual Activity  . Alcohol use: Yes    Alcohol/week: 0.0 standard drinks    Comment: rarely wine  . Drug use: No  . Sexual activity: Not on file  Lifestyle  . Physical activity:    Days per week: Not on file    Minutes per session: Not on file  . Stress: Not on file  Relationships  . Social connections:    Talks on phone: Not on file    Gets together: Not on file    Attends religious service: Not on file    Active member of club or organization: Not on file    Attends meetings of clubs or organizations: Not on file    Relationship status: Not on file  . Intimate partner violence:    Fear of current or ex partner: Not on file    Emotionally abused: Not on file    Physically abused: Not on file    Forced sexual activity: Not on file  Other Topics Concern  . Not on file  Social History Narrative  . Not on file     Review of Systems: General: negative for chills, fever, night sweats or weight changes.  Cardiovascular: negative for chest pain, dyspnea on exertion, edema,  orthopnea, palpitations, paroxysmal nocturnal dyspnea or shortness of breath Dermatological: negative for rash Respiratory: negative for cough or wheezing Urologic: negative for hematuria Abdominal: negative for nausea, vomiting, diarrhea, bright red blood per rectum, melena, or hematemesis Neurologic: negative for visual changes, syncope, or dizziness All other systems reviewed and are otherwise negative except as noted above.    Blood pressure (!) 142/78, pulse 83, height 5\' 8"  (1.727 m), weight 163 lb 6.4 oz (74.1 kg).  General appearance: alert and no distress Neck: no adenopathy, no  carotid bruit, no JVD, supple, symmetrical, trachea midline and thyroid not enlarged, symmetric, no tenderness/mass/nodules Lungs: clear to auscultation bilaterally Heart: regular rate and rhythm, S1, S2 normal, no murmur, click, rub or gallop Extremities: extremities normal, atraumatic, no cyanosis or edema Pulses: 2+ and symmetric Skin: Skin color, texture, turgor normal. No rashes or lesions Neurologic: Alert and oriented X 3, normal strength and tone. Normal symmetric reflexes. Normal coordination and gait  EKG sinus rhythm 83 with occasional PVCs.  I personally reviewed this EKG.  ASSESSMENT AND PLAN:   Essential hypertension History of essential hypertension with blood pressure measured today at 78.  He is on amlodipine.  Continue current meds at current dosing.  Preoperative clearance Mr. Bubb was referred by Dr. Gladstone Lighter for preoperative clearance before back surgery for a ruptured disc.  His only risk factor is treated hypertension.  Never had a heart attack or stroke.  He denies chest pain or shortness of breath.  He is at moderately elevated risk because of his age.  He also has lung cancer and recently diagnosed pancreatic cancer.  I am going to get a 2D echo to assess LV function.  I do not think he needs a functional study prior to clearing him for his upcoming back  procedure.      Lorretta Harp MD FACP,FACC,FAHA, [facility] 05/22/2018 9:11 AM

## 2018-05-22 NOTE — Assessment & Plan Note (Signed)
History of essential hypertension with blood pressure measured today at 78.  He is on amlodipine.  Continue current meds at current dosing.

## 2018-05-22 NOTE — Patient Instructions (Signed)
Medication Instructions:  Your physician recommends that you continue on your current medications as directed. Please refer to the Current Medication list given to you today.  If you need a refill on your cardiac medications before your next appointment, please call your pharmacy.   Lab work: none If you have labs (blood work) drawn today and your tests are completely normal, you will receive your results only by: Marland Kitchen MyChart Message (if you have MyChart) OR . A paper copy in the mail If you have any lab test that is abnormal or we need to change your treatment, we will call you to review the results.  Testing/Procedures: Your physician has requested that you have an echocardiogram. Echocardiography is a painless test that uses sound waves to create images of your heart. It provides your doctor with information about the size and shape of your heart and how well your heart's chambers and valves are working. This procedure takes approximately one hour. There are no restrictions for this procedure.    Follow-Up: At [facility], you and your health needs are our priority.  As part of our continuing mission to provide you with exceptional heart care, we have created designated Provider Care Teams.  These Care Teams include your primary Cardiologist (physician) and Advanced Practice Providers (APPs -  Physician Assistants and Nurse Practitioners) who all work together to provide you with the care you need, when you need it.  Follow up with Dr. Gwenlyn Found or his team as needed.  Any Other Special Instructions Will Be Listed Below (If Applicable).

## 2018-05-22 NOTE — Assessment & Plan Note (Signed)
Mr. Barham was referred by Dr. Gladstone Lighter for preoperative clearance before back surgery for a ruptured disc.  His only risk factor is treated hypertension.  Never had a heart attack or stroke.  He denies chest pain or shortness of breath.  He is at moderately elevated risk because of his age.  He also has lung cancer and recently diagnosed pancreatic cancer.  I am going to get a 2D echo to assess LV function.  I do not think he needs a functional study prior to clearing him for his upcoming back procedure.

## 2018-05-24 ENCOUNTER — Other Ambulatory Visit: Payer: Self-pay | Admitting: Internal Medicine

## 2018-05-24 DIAGNOSIS — C251 Malignant neoplasm of body of pancreas: Secondary | ICD-10-CM | POA: Diagnosis not present

## 2018-05-24 DIAGNOSIS — K8689 Other specified diseases of pancreas: Secondary | ICD-10-CM

## 2018-05-27 ENCOUNTER — Telehealth: Payer: Self-pay | Admitting: Internal Medicine

## 2018-05-27 ENCOUNTER — Encounter: Payer: Self-pay | Admitting: Radiation Oncology

## 2018-05-27 NOTE — Telephone Encounter (Signed)
I spoke with patient and Lab appt scheduled/ referral place through [facility] per 10/11 sch msg

## 2018-05-29 ENCOUNTER — Ambulatory Visit (HOSPITAL_COMMUNITY): Payer: PPO | Attending: Cardiology

## 2018-05-29 ENCOUNTER — Other Ambulatory Visit: Payer: Self-pay

## 2018-05-29 DIAGNOSIS — Z01810 Encounter for preprocedural cardiovascular examination: Secondary | ICD-10-CM | POA: Insufficient documentation

## 2018-05-29 DIAGNOSIS — Z01818 Encounter for other preprocedural examination: Secondary | ICD-10-CM | POA: Diagnosis not present

## 2018-05-30 ENCOUNTER — Other Ambulatory Visit: Payer: PPO

## 2018-05-31 ENCOUNTER — Inpatient Hospital Stay: Payer: PPO

## 2018-05-31 DIAGNOSIS — C250 Malignant neoplasm of head of pancreas: Secondary | ICD-10-CM | POA: Diagnosis not present

## 2018-05-31 DIAGNOSIS — K8689 Other specified diseases of pancreas: Secondary | ICD-10-CM

## 2018-05-31 LAB — CBC WITH DIFFERENTIAL (CANCER CENTER ONLY)
ABS IMMATURE GRANULOCYTES: 0.13 10*3/uL — AB (ref 0.00–0.07)
BASOS PCT: 0 %
Basophils Absolute: 0 10*3/uL (ref 0.0–0.1)
Eosinophils Absolute: 0.2 10*3/uL (ref 0.0–0.5)
Eosinophils Relative: 2 %
HEMATOCRIT: 30.7 % — AB (ref 39.0–52.0)
Hemoglobin: 10.5 g/dL — ABNORMAL LOW (ref 13.0–17.0)
IMMATURE GRANULOCYTES: 1 %
LYMPHS ABS: 1.3 10*3/uL (ref 0.7–4.0)
Lymphocytes Relative: 14 %
MCH: 32.9 pg (ref 26.0–34.0)
MCHC: 34.2 g/dL (ref 30.0–36.0)
MCV: 96.2 fL (ref 80.0–100.0)
MONO ABS: 1.5 10*3/uL — AB (ref 0.1–1.0)
MONOS PCT: 15 %
NEUTROS ABS: 6.7 10*3/uL (ref 1.7–7.7)
Neutrophils Relative %: 68 %
PLATELETS: 469 10*3/uL — AB (ref 150–400)
RBC: 3.19 MIL/uL — ABNORMAL LOW (ref 4.22–5.81)
RDW: 12.4 % (ref 11.5–15.5)
WBC Count: 9.8 10*3/uL (ref 4.0–10.5)
nRBC: 0 % (ref 0.0–0.2)

## 2018-05-31 LAB — CMP (CANCER CENTER ONLY)
ALBUMIN: 3.6 g/dL (ref 3.5–5.0)
ALK PHOS: 65 U/L (ref 38–126)
ALT: 14 U/L (ref 0–44)
AST: 20 U/L (ref 15–41)
Anion gap: 6 (ref 5–15)
BUN: 10 mg/dL (ref 8–23)
CO2: 27 mmol/L (ref 22–32)
Calcium: 10.1 mg/dL (ref 8.9–10.3)
Chloride: 95 mmol/L — ABNORMAL LOW (ref 98–111)
Creatinine: 0.65 mg/dL (ref 0.61–1.24)
GFR, Est AFR Am: >60 mL/min (ref >60)
GFR, Estimated: >60 mL/min (ref >60)
GLUCOSE: 105 mg/dL — AB (ref 70–99)
POTASSIUM: 4.2 mmol/L (ref 3.5–5.1)
SODIUM: 128 mmol/L — AB (ref 135–145)
TOTAL PROTEIN: 6.5 g/dL (ref 6.5–8.1)
Total Bilirubin: 0.6 mg/dL (ref 0.3–1.2)

## 2018-06-01 LAB — CANCER ANTIGEN 19-9: CA 19-9: 229 U/mL — ABNORMAL HIGH (ref 0–35)

## 2018-06-03 NOTE — Progress Notes (Signed)
GI Location of Tumor / Histology: Pancreatic Adenocarcinoma  History of stage III lung cancer treated with chemotherapy.  Upper endoscopic ultrasound 05/16/18: Irregular mass identified in the pancreatic body/tail.  The mass was hypoechoic and heterogenous.  The mass measured 29 mm in maximal cross sectional diameter.  The endosonographic borders were poorly defined.  The upstream pancreatic duct was dilated to 7.5 mm, the mass clearly invades the splenic vessels but no other major vessels.   -The pancreatic parenchyma was otherwise normal, no peripancreatic adenopathy, CBD was normal, non dilated.  MRI Abdomen 04/30/2018: Pancreatic body mass is highly suspicious for adenocarcinoma.  No typical findings of abdominal metastatic disease.  Early hypoenhancing, late peripherally hyperenhancing lesion within the pancreatic body which measures 3.3 x 2.5 cm.  This causes upstream atrophy and duct dilatation.  CT chest 04/16/2018: Mass-like 2.9 cm soft tissue focus at the junction of the pancreatic body and tail, with associated pancreatic tail atrophy.  Although stable in the interval, this finding is new since 2017 CT abdomen.  Sean Cooke presented for follow-up visit for lung cancer. He had restaging scans for his lung cancer and was found to have a mass in his pancreas.  Biopsies/Endoscopy of Pancreas with Dr. Ardis Hughs  Past/Anticipated interventions by surgeon, if any:  Dr. Barry Dienes 05/24/2018 -I reccommed neoadjuvant treatment with radiation, chemo, or chemoradiation.  This patient is in relatively good health, but ultimately the biology of pancreatic cancer will likely overcome with or without surgery. -He would do better with additional treatment other than surgery and is at risk for metastatic disease because of venous invasion.  We do not yet have tumor marker back yet.  This would require open operation based on prior splenectomy for splenic trauma and location of tumor. -Also given his age, I  think it is actually more important to preserve quality of life with treatment of his ruptured disc.  Perhaps stereotactic radiation or chemoradiation followed by discectomy and then restaging might be the best timeline for him.   -Follow-up with [place] in the office in 3 months.   Past/Anticipated interventions by medical oncology, if any:  Dr. Julien Nordmann 05/20/2018 -I had a lengthy discussion with the patient about his condition and treatment options. -I discussed with the patient referral to Dr. Barry Dienes with [facility] for evaluation for surgical resection. -If surgery is not an option, I would consider referring the patient to radiation oncology for evaluation and curative radiotherapy to the pancreatic lesion. -I will see the patient back for follow-up visit in 2 months for evaluation and more detailed discussion of treatment options.  PRIOR THERAPY:  1) Systemic chemotherapy with carboplatin for AUC of 5 and Alimta 500 MG/M2 every 3 weeks status post 6 cycles with partial response. 2) Maintenance systemic chemotherapy with single agent Alimta 500 MG/M2 every 3 weeks, status post 15 cycles.  CURRENT THERAPY: Observation.   Weight changes, if any: No  Bowel/Bladder complaints, if any: Has bouts of constipation and diarrhea.  Nausea / Vomiting, if any: No  Pain issues, if any:  Back, right hip, right leg pain, 5/10.    BP (!) 165/75 (BP Location: Left Arm, Patient Position: Sitting)   Pulse 72   Temp 97.7 F (36.5 C) (Oral)   Resp 20   Ht 5\' 8"  (1.727 m)   Wt 157 lb 4 oz (71.3 kg)   SpO2 99%   BMI 23.91 kg/m    Wt Readings from Last 3 Encounters:  06/04/18 157 lb 4 oz (  71.3 kg)  05/22/18 163 lb 6.4 oz (74.1 kg)  05/20/18 165 lb 9.6 oz (75.1 kg)    SAFETY ISSUES:  Prior radiation? No  Pacemaker/ICD?  No  Possible current pregnancy? No  Is the patient on methotrexate? No  Current Complaints/Details: -Transurethral resection of prostate -Splenectomy -Has  a ruptured disc in his lumbar spine.  He complains of radiculopathy and some right leg weakness.

## 2018-06-04 ENCOUNTER — Ambulatory Visit
Admission: RE | Admit: 2018-06-04 | Discharge: 2018-06-04 | Disposition: A | Discharge status: Home / Self Care | Payer: PPO | Source: Ambulatory Visit | Attending: Radiation Oncology | Admitting: Radiation Oncology

## 2018-06-04 ENCOUNTER — Other Ambulatory Visit: Payer: Self-pay

## 2018-06-04 ENCOUNTER — Encounter: Payer: Self-pay | Admitting: Radiation Oncology

## 2018-06-04 VITALS — BP 165/75 | HR 72 | Temp 97.7°F | Resp 20 | Ht 68.0 in | Wt 157.2 lb

## 2018-06-04 DIAGNOSIS — Z9221 Personal history of antineoplastic chemotherapy: Secondary | ICD-10-CM | POA: Diagnosis not present

## 2018-06-04 DIAGNOSIS — Z85118 Personal history of other malignant neoplasm of bronchus and lung: Secondary | ICD-10-CM | POA: Diagnosis not present

## 2018-06-04 DIAGNOSIS — C251 Malignant neoplasm of body of pancreas: Secondary | ICD-10-CM

## 2018-06-04 DIAGNOSIS — R978 Other abnormal tumor markers: Secondary | ICD-10-CM | POA: Diagnosis not present

## 2018-06-04 DIAGNOSIS — M545 Low back pain: Secondary | ICD-10-CM | POA: Insufficient documentation

## 2018-06-04 DIAGNOSIS — C259 Malignant neoplasm of pancreas, unspecified: Secondary | ICD-10-CM | POA: Diagnosis not present

## 2018-06-04 DIAGNOSIS — C3411 Malignant neoplasm of upper lobe, right bronchus or lung: Secondary | ICD-10-CM

## 2018-06-05 ENCOUNTER — Other Ambulatory Visit: Payer: Self-pay

## 2018-06-05 ENCOUNTER — Telehealth: Payer: Self-pay

## 2018-06-05 DIAGNOSIS — K8689 Other specified diseases of pancreas: Secondary | ICD-10-CM

## 2018-06-05 NOTE — Telephone Encounter (Signed)
-----   Message from Milus Banister, MD sent at 06/05/2018  7:48 AM EDT ----- Regarding: RE: Fiducial Markers Asuzena Weis and Sean Cooke;  I'm out to Nov 14th for first available spot.  Certainly can put her on for that but only if Chester Holstein can make it since it'd be my first one.  Probably safer to schedule as first available with Sean Cooke, let me know when that is and I'll be there.  Thanks  ----- Message ----- From: Irving Copas., MD Sent: 06/04/2018  12:49 PM EDT To: Milus Banister, MD, # Subject: RE: Fiducial Markers                           Samai Corea, Can you look into my schedule and tell me when I have an open slot for EUS? I'm away the end of next week until mid-portion of the the beginning of November. I wanted to try and coordinate this so Linna Hoff could be a part of the first couple of these. If not, maybe if Linna Hoff has an open slot for a procedure, I could find a way to wrangle myself out of clinic to help when I am around. Let us know so we can work on this. Thanks. Sean Cooke  ----- Message ----- From: Hayden Pedro, PA-C Sent: 06/04/2018  12:26 PM EDT To: Milus Banister, MD, # Subject: Fiducial Markers                               Hi there! We have our first pt with early pancreas cancer who's not a great surgical candidate due to age/comorbidities. He is also just hoping to avoid whipple due to recovery. Could you guys possibly see him and put in fiducials for his pancreatic body tumor for [place] to move forward with SBRT?  Thanks, Bryson Ha

## 2018-06-05 NOTE — Telephone Encounter (Signed)
EUS scheduled, pt instructed and medications reviewed.  Patient instructions mailed to home.  Patient to call with any questions or concerns.   FYI Dr Ardis Hughs and Mansouraty the pt has been scheduled for 11/18 at [facility].  He has been advised

## 2018-06-06 ENCOUNTER — Telehealth: Payer: Self-pay | Admitting: Cardiovascular Disease

## 2018-06-06 DIAGNOSIS — M542 Cervicalgia: Secondary | ICD-10-CM | POA: Diagnosis not present

## 2018-06-06 NOTE — Telephone Encounter (Signed)
(  smartpharse not working)  Name of procedure: Lumbar surgery Provider: Dr. Gladstone Lighter Date of Surgery: TBD FAX #: [phone] PHONE #: [phone] TYPE OF ANES: General

## 2018-06-06 NOTE — Progress Notes (Signed)
Radiation Oncology         [phone] ________________________________  Name: Sean Cooke        MRN: [id number]  Date of Service: 06/04/2018 DOB: [birth year]  [id number], Lu Duffel, PA-C  Curt Bears, MD     REFERRING PHYSICIAN: Curt Bears, MD   DIAGNOSIS: The primary encounter diagnosis was Malignant neoplasm of body of pancreas ([place]). Diagnoses of Malignant neoplasm of right upper lobe of lung ([place]) and Primary adenocarcinoma of body of pancreas ([place]) were also pertinent to this visit.   HISTORY OF PRESENT ILLNESS: Sean Cooke is a 84 y.o. male seen at the request of Dr. Julien Nordmann for new diagnosis of stage I adenocarcinoma of the pancreas.  The patient has a history of stage III lung cancer which was originally diagnosed in November 2016 as a stage IIIb, T1 aN3 M0 non-small cell lung cancer adenocarcinoma initially involving the right middle lobe with mediastinal and left supraclavicular adenopathy.  He underwent systemic chemotherapy followed by consolidative single agent Alimta for 15 additional cycles, he has been on observation since, was recently found on imaging to have a lesion in the body of the pancreas.  This was measured at 3 x 2.5 cm, and no peripancreatic adenopathy was identified.  He underwent EUS on 05/16/2018 with Dr. Ardis Hughs, biopsy was performed and this revealed an adenocarcinoma arising in the pancreas. A CA 19-9 was 222 on 05/31/18.  He comes today to discuss options for treatment as he is already met with Dr. Barry Dienes and is not favored to be a surgical candidate for Whipple.   PREVIOUS RADIATION THERAPY: No   PAST MEDICAL HISTORY:  Past Medical History:  Diagnosis Date  . Adenomatous colon polyp 05/2003  . Arthritis   . Degenerative joint disease   . Diverticulosis   . Duodenal diverticulum   . Encounter for antineoplastic chemotherapy 07/31/2015  . GERD (gastroesophageal reflux disease)   . Hemorrhoids   . Hiatal hernia   . Lung cancer  ([place])   . Neuritis/radiculitis due to displacement of lumbar intervertebral disc   . Pneumonia   . Rib fracture        PAST SURGICAL HISTORY: Past Surgical History:  Procedure Laterality Date  . CATARACT EXTRACTION     bilateral  . CHOLECYSTECTOMY  06/2003  . EUS N/A 05/16/2018   Procedure: UPPER ENDOSCOPIC ULTRASOUND (EUS) RADIAL;  Surgeon: Milus Banister, MD;  Location: [facility];  Service: Endoscopy;  Laterality: N/A;  . FINE NEEDLE ASPIRATION N/A 05/16/2018   Procedure: FINE NEEDLE ASPIRATION (FNA) LINEAR;  Surgeon: Milus Banister, MD;  Location: [facility];  Service: Endoscopy;  Laterality: N/A;  . INGUINAL HERNIA REPAIR    . SPLENECTOMY  2002  . TRANSURETHRAL RESECTION OF PROSTATE       FAMILY HISTORY:  Family History  Problem Relation Age of Onset  . Diabetes Father   . Colon cancer Neg Hx      SOCIAL HISTORY:  reports that he quit smoking about 29 years ago. His smoking use included cigarettes and pipe. He has never used smokeless tobacco. He reports that he drinks alcohol. He reports that he does not use drugs.   ALLERGIES: Benzonatate; Lyrica [pregabalin]; Gabapentin; Tizanidine; Penicillins; and Tramadol   MEDICATIONS:  Current Outpatient Medications  Medication Sig Dispense Refill  . amLODipine (NORVASC) 5 MG tablet Take 5 mg by mouth every evening.     Marland Kitchen antiseptic oral rinse (BIOTENE) LIQD 15 mLs by Mouth Rinse route 5 (five)  times daily as needed for dry mouth.     Marland Kitchen aspirin 81 MG tablet Take 81 mg by mouth at bedtime.     . Calcium Carb-Cholecalciferol (CALCIUM-VITAMIN D) 500-200 MG-UNIT tablet Take 1 tablet by mouth 2 (two) times daily. Morning & after supper    . calcium-vitamin D (OSCAL WITH D) 500-200 MG-UNIT TABS tablet Take by mouth.    . cromolyn (OPTICROM) 4 % ophthalmic solution Place 1 drop into both eyes 4 (four) times daily. Morning, noon, 1800, & bedtime.    Marland Kitchen denosumab (PROLIA) 60 MG/ML SOLN injection Inject 60 mg into the skin every  6 (six) months. Administer in upper arm, thigh, or abdomen    . ferrous sulfate 325 (65 FE) MG tablet Take 325 mg by mouth 2 (two) times daily with a meal. Morning & after supper    . guaiFENesin (MUCINEX) 600 MG 12 hr tablet Take 600 mg by mouth 2 (two) times daily.     Marland Kitchen lactobacillus acidophilus & bulgar (LACTINEX) chewable tablet Chew 1 tablet by mouth daily. Store in [place]     . lidocaine-prilocaine (EMLA) cream Apply 1 application topically as needed. (Patient taking differently: Apply 1 application topically daily as needed (back pain.). ) 30 g 3  . Liniments (SALONPAS ARTHRITIS PAIN RELIEF EX) Apply 1 patch topically daily as needed (pain.).    Marland Kitchen loperamide (IMODIUM) 2 MG capsule Take 2 mg by mouth as needed for diarrhea or loose stools. Reported on 03/01/2016    . loratadine (CLARITIN) 10 MG tablet Take 10 mg by mouth daily.     Marland Kitchen losartan (COZAAR) 100 MG tablet Take 100 mg by mouth daily.    . magic mouthwash SOLN Take 5-10 mLs by mouth 2 (two) times daily as needed for irritation.  0  . Multiple Vitamin (MULTIVITAMIN) tablet Take 1 tablet by mouth daily.    . Omega-3 Fatty Acids (FISH OIL) 1000 MG CAPS Take 1,000 mg by mouth daily.     Marland Kitchen oxybutynin (DITROPAN) 5 MG tablet Take 5 mg by mouth at bedtime.     . pantoprazole (PROTONIX) 40 MG tablet Take 40 mg by mouth daily.    . polyethylene glycol (MIRALAX / GLYCOLAX) packet Take 17 g by mouth daily as needed (constipation.).     Marland Kitchen Probiotic Product (ALIGN) 4 MG CAPS Take 4 mg by mouth daily.     Marland Kitchen PROCTOSOL HC 2.5 % rectal cream Place 1 application rectally 2 (two) times daily as needed (hemorrhoids).   0  . saw palmetto 160 MG capsule Take 160 mg by mouth daily.    . simethicone (MYLICON) 80 MG chewable tablet Chew 80 mg by mouth every 6 (six) hours as needed for flatulence.    . Tamsulosin HCl (FLOMAX) 0.4 MG CAPS Take 0.4 mg by mouth daily.    . Turmeric 500 MG TABS Take 500 mg by mouth daily after supper.      No current  facility-administered medications for this encounter.      REVIEW OF SYSTEMS: On review of systems, the patient reports that he is doing well overall.  He reports he has not had any difficulty with digestion, denies any nausea, vomiting, or abdominal pain.  He also denies any chest pain, shortness of breath, cough, fevers, chills, night sweats, unintended weight changes. He denies any bowel or bladder disturbances. He denies any new musculoskeletal or joint aches or pains.  He does however have significant low back pain, contemplating surgery with  Dr. Gladstone Lighter.  A complete review of systems is obtained and is otherwise negative.     PHYSICAL EXAM:  Wt Readings from Last 3 Encounters:  06/04/18 157 lb 4 oz (71.3 kg)  05/22/18 163 lb 6.4 oz (74.1 kg)  05/20/18 165 lb 9.6 oz (75.1 kg)   Temp Readings from Last 3 Encounters:  06/04/18 97.7 F (36.5 C) (Oral)  05/20/18 97.8 F (36.6 C) (Oral)  05/16/18 97.6 F (36.4 C) (Oral)   BP Readings from Last 3 Encounters:  06/04/18 (!) 165/75  05/22/18 (!) 142/78  05/20/18 (!) 149/70   Pulse Readings from Last 3 Encounters:  06/04/18 72  05/22/18 83  05/20/18 92   Pain Assessment Pain Score: 5  Pain Frequency: Constant Pain Loc: Back/10  In general this is a well appearing Caucasian male in no acute distress.  He is alert and oriented x4 and appropriate throughout the examination. HEENT reveals that the patient is normocephalic, atraumatic. EOMs are intact.  Skin is intact without any evidence of gross lesions. Cardiovascular exam reveals a regular rate and rhythm, no clicks rubs or murmurs are auscultated. Chest is clear to auscultation bilaterally. Lymphatic assessment is performed and does not reveal any adenopathy in the cervical, supraclavicular, axillary, or inguinal chains. Abdomen has active bowel sounds in all quadrants and is intact. The abdomen is soft, non tender, non distended. Lower extremities are negative for pretibial pitting  edema, deep calf tenderness, cyanosis or clubbing.   ECOG = 0  0 - Asymptomatic (Fully active, able to carry on all predisease activities without restriction)  1 - Symptomatic but completely ambulatory (Restricted in physically strenuous activity but ambulatory and able to carry out work of a light or sedentary nature. For example, light housework, office work)  2 - Symptomatic, <50% in bed during the day (Ambulatory and capable of all self care but unable to carry out any work activities. Up and about more than 50% of waking hours)  3 - Symptomatic, >50% in bed, but not bedbound (Capable of only limited self-care, confined to bed or chair 50% or more of waking hours)  4 - Bedbound (Completely disabled. Cannot carry on any self-care. Totally confined to bed or chair)  5 - Death   Eustace Pen MM, Creech RH, Tormey DC, et al. [phone]). "Toxicity and response criteria of the [facility]". [place] Oncol. 5 (6): 649-55    LABORATORY DATA:  Lab Results  Component Value Date   WBC 9.8 05/31/2018   HGB 10.5 (L) 05/31/2018   HCT 30.7 (L) 05/31/2018   MCV 96.2 05/31/2018   PLT 469 (H) 05/31/2018   Lab Results  Component Value Date   NA 128 (L) 05/31/2018   K 4.2 05/31/2018   CL 95 (L) 05/31/2018   CO2 27 05/31/2018   Lab Results  Component Value Date   ALT 14 05/31/2018   AST 20 05/31/2018   ALKPHOS 65 05/31/2018   BILITOT 0.6 05/31/2018      RADIOGRAPHY: No results found.     IMPRESSION/PLAN: 1. Stage I, T2N0 adenocarcinoma of the pancreatic body. Dr. Lisbeth Renshaw discusses the pathology findings and reviews the nature of early stage pancreatic cancer, the patient is technically resectable however it is not felt to be a good candidate due to his medical comorbidities and age.  Localized findings on his imaging, he would be a good candidate to consider stereotactic body radiotherapy as an alternative.  We discussed the differences in terms of expectations for  surgical resection versus definitive radiotherapy.  We discussed the risks, benefits, short, and long term effects of radiotherapy, and the patient is interested in proceeding. Dr. Lisbeth Renshaw discusses the delivery and logistics of radiotherapy and anticipates a course of 5-10 fractions of radiotherapy. Written consent is obtained and placed in the chart, a copy was provided to the patient. We also discussed the rationale for fiducial marker placement and I've reached out to Dr. Rush Landmark and Dr. Ardis Hughs. We will plan simulation once we know the date of his procedure. 2. Low back pain with history of arthritic change.  The patient is counseled on the rationale to proceed with treatment for his cancer prior to considering elective surgery.  He is in agreement with this plan. 3. History of stage III lung cancer.  He will continue surveillance with Dr. Julien Nordmann following treatment for his pancreas cancer.  The above documentation reflects my direct findings during this shared patient visit. Please see the separate note by Dr. Lisbeth Renshaw on this date for the remainder of the patient's plan of care.    Carola Rhine, PAC

## 2018-06-07 NOTE — Telephone Encounter (Signed)
Patient seen by Dr Gwenlyn Found 05/22/18 who cleared him for surgery. I will forward his clearance note.  Kerin Ransom PA-C 06/07/2018 9:38 AM

## 2018-06-10 DIAGNOSIS — M542 Cervicalgia: Secondary | ICD-10-CM | POA: Diagnosis not present

## 2018-06-12 ENCOUNTER — Encounter (HOSPITAL_COMMUNITY): Payer: Self-pay

## 2018-06-12 NOTE — Patient Instructions (Addendum)
Sean Cooke  [birth year]     Your procedure is scheduled on:  06-18-2018    Report to [facility] Main  Entrance,  Report to admitting at  11:15 AM     Call this number if you have problems the morning of surgery [phone]      Remember: Do not eat food After Midnight.  May have clear liquid diet from midnight until 7:15 AM day of surgery.  Nothing by mouth after 7:15 AM including water, candy, gum, mints                                      BRUSH YOUR TEETH MORNING OF SURGERY AND RINSE YOUR MOUTH OUT.       Take these medicines the morning of surgery with A SIP OF WATER:  Loratidine (claritin), Tamsulosin (flomax), Pantoprazole (protonix), Famitidine (pepcid),                                                                                                                                 Eye drops as usual and bring with you day of surgery                                  You may not have any metal on your body including hair pins and piercings              Do not wear jewelry, make-up, lotions, powders or perfumes, deodorant                    Men may shave face and neck.      Do not bring valuables to the hospital. [place].  Contacts, dentures or bridgework may not be worn into surgery.  Leave suitcase in the car. After surgery it may be brought to your room.      Special Instructions: N/A   _____________________________________________________________________      CLEAR LIQUID DIET   Foods Allowed                                                                     Foods Excluded  Coffee and tea, regular and decaf                             liquids  that you cannot  Plain Jell-O in any flavor                                             see through such as: Fruit ices (not with fruit pulp)                                     milk, soups, orange juice  Iced Popsicles                                     All solid food Carbonated beverages, regular and diet                                    Cranberry, grape and apple juices Sports drinks like Gatorade Lightly seasoned clear broth or consume(fat free) Sugar, honey syrup  Sample Menu Breakfast                                Lunch                                     Supper Cranberry juice                    Beef broth                            Chicken broth Jell-O                                     Grape juice                           Apple juice Coffee or tea                        Jell-O                                      Popsicle                                                Coffee or tea                        Coffee or tea  _____________________________________________________________________            [facility] - Preparing for Surgery Before surgery, you can play an important role.  Because skin is not sterile, your skin needs to be as free of germs as possible.  You can reduce the number of germs on your skin by washing with CHG (chlorahexidine gluconate) soap before  surgery.  CHG is an antiseptic cleaner which kills germs and bonds with the skin to continue killing germs even after washing. Please DO NOT use if you have an allergy to CHG or antibacterial soaps.  If your skin becomes reddened/irritated stop using the CHG and inform your nurse when you arrive at Short Stay. Do not shave (including legs and underarms) for at least 48 hours prior to the first CHG shower.  You may shave your face/neck. Please follow these instructions carefully:  1.  Shower with CHG Soap the night before surgery and the  morning of Surgery.  2.  If you choose to wash your hair, wash your hair first as usual with your  normal  shampoo.  3.  After you shampoo, rinse your hair and body thoroughly to remove the  shampoo.                            4.  Use CHG as you would any other liquid soap.  You can apply chg directly   to the skin and wash                       Gently with a scrungie or clean washcloth.  5.  Apply the CHG Soap to your body ONLY FROM THE NECK DOWN.   Do not use on face/ open                           Wound or open sores. Avoid contact with eyes, ears mouth and genitals (private parts).                       Wash face,  Genitals (private parts) with your normal soap.             6.  Wash thoroughly, paying special attention to the area where your surgery  will be performed.  7.  Thoroughly rinse your body with warm water from the neck down.  8.  DO NOT shower/wash with your normal soap after using and rinsing off  the CHG Soap.             9.  Pat yourself dry with a clean towel.            10.  Wear clean pajamas.            11.  Place clean sheets on your bed the night of your first shower and do not  sleep with pets. Day of Surgery : Do not apply any lotions/deodorants the morning of surgery.  Please wear clean clothes to the hospital/surgery center.  FAILURE TO FOLLOW THESE INSTRUCTIONS MAY RESULT IN THE CANCELLATION OF YOUR SURGERY PATIENT SIGNATURE_________________________________  NURSE SIGNATURE__________________________________  ________________________________________________________________________   Sean Cooke  An incentive spirometer is a tool that can help keep your lungs clear and active. This tool measures how well you are filling your lungs with each breath. Taking long deep breaths may help reverse or decrease the chance of developing breathing (pulmonary) problems (especially infection) following:  A long period of time when you are unable to move or be active. BEFORE THE PROCEDURE   If the spirometer includes an indicator to show your best effort, your nurse or respiratory therapist will set it to a desired goal.  If possible, sit up straight or lean slightly forward. Try not to slouch.  Hold the incentive spirometer in an upright position. INSTRUCTIONS  FOR USE  1. Sit on the edge of your bed if possible, or sit up as far as you can in bed or on a chair. 2. Hold the incentive spirometer in an upright position. 3. Breathe out normally. 4. Place the mouthpiece in your mouth and seal your lips tightly around it. 5. Breathe in slowly and as deeply as possible, raising the piston or the ball toward the top of the column. 6. Hold your breath for 3-5 seconds or for as long as possible. Allow the piston or ball to fall to the bottom of the column. 7. Remove the mouthpiece from your mouth and breathe out normally. 8. Rest for a few seconds and repeat Steps 1 through 7 at least 10 times every 1-2 hours when you are awake. Take your time and take a few normal breaths between deep breaths. 9. The spirometer may include an indicator to show your best effort. Use the indicator as a goal to work toward during each repetition. 10. After each set of 10 deep breaths, practice coughing to be sure your lungs are clear. If you have an incision (the cut made at the time of surgery), support your incision when coughing by placing a pillow or rolled up towels firmly against it. Once you are able to get out of bed, walk around indoors and cough well. You may stop using the incentive spirometer when instructed by your caregiver.  RISKS AND COMPLICATIONS  Take your time so you do not get dizzy or light-headed.  If you are in pain, you may need to take or ask for pain medication before doing incentive spirometry. It is harder to take a deep breath if you are having pain. AFTER USE  Rest and breathe slowly and easily.  It can be helpful to keep track of a log of your progress. Your caregiver can provide you with a simple table to help with this. If you are using the spirometer at home, follow these instructions: [place] IF:   You are having difficultly using the spirometer.  You have trouble using the spirometer as often as instructed.  Your pain  medication is not giving enough relief while using the spirometer.  You develop fever of 100.5 F (38.1 C) or higher. SEEK IMMEDIATE MEDICAL CARE IF:   You cough up bloody sputum that had not been present before.  You develop fever of 102 F (38.9 C) or greater.  You develop worsening pain at or near the incision site. MAKE SURE YOU:   Understand these instructions.  Will watch your condition.  Will get help right away if you are not doing well or get worse. Document Released: 12/11/2006 Document Revised: 10/23/2011 Document Reviewed: 02/11/2007 [facility] Patient Information 2014 [place], [place].   ________________________________________________________________________

## 2018-06-13 ENCOUNTER — Encounter (HOSPITAL_COMMUNITY)
Admission: RE | Admit: 2018-06-13 | Discharge: 2018-06-13 | Disposition: A | Discharge status: Home / Self Care | Payer: PPO | Source: Ambulatory Visit | Attending: Orthopedic Surgery | Admitting: Orthopedic Surgery

## 2018-06-13 ENCOUNTER — Ambulatory Visit (HOSPITAL_COMMUNITY)
Admission: RE | Admit: 2018-06-13 | Discharge: 2018-06-13 | Disposition: A | Discharge status: Home / Self Care | Payer: PPO | Source: Ambulatory Visit | Attending: Surgical | Admitting: Surgical

## 2018-06-13 ENCOUNTER — Other Ambulatory Visit: Payer: Self-pay

## 2018-06-13 ENCOUNTER — Encounter (HOSPITAL_COMMUNITY): Payer: Self-pay

## 2018-06-13 ENCOUNTER — Telehealth: Payer: Self-pay

## 2018-06-13 DIAGNOSIS — M545 Low back pain, unspecified: Secondary | ICD-10-CM

## 2018-06-13 DIAGNOSIS — I700 Atherosclerosis of aorta: Secondary | ICD-10-CM | POA: Insufficient documentation

## 2018-06-13 DIAGNOSIS — Z01818 Encounter for other preprocedural examination: Secondary | ICD-10-CM | POA: Diagnosis not present

## 2018-06-13 DIAGNOSIS — M47816 Spondylosis without myelopathy or radiculopathy, lumbar region: Secondary | ICD-10-CM | POA: Diagnosis not present

## 2018-06-13 HISTORY — DX: Localized edema: R60.0

## 2018-06-13 HISTORY — DX: Benign prostatic hyperplasia with lower urinary tract symptoms: N40.1

## 2018-06-13 HISTORY — DX: Low back pain, unspecified: M54.50

## 2018-06-13 HISTORY — DX: Other intervertebral disc displacement, lumbar region: M51.26

## 2018-06-13 HISTORY — DX: Other fatigue: R53.83

## 2018-06-13 HISTORY — DX: Essential (primary) hypertension: I10

## 2018-06-13 HISTORY — DX: Anemia, unspecified: D64.9

## 2018-06-13 HISTORY — DX: Malignant neoplasm of pancreas, unspecified: C25.9

## 2018-06-13 HISTORY — DX: Personal history of colonic polyps: Z86.010

## 2018-06-13 HISTORY — DX: Personal history of adenomatous and serrated colon polyps: Z86.0101

## 2018-06-13 HISTORY — DX: Spinal stenosis, lumbar region without neurogenic claudication: M48.061

## 2018-06-13 HISTORY — DX: Other specified personal risk factors, not elsewhere classified: Z91.89

## 2018-06-13 HISTORY — DX: Weakness: R53.1

## 2018-06-13 HISTORY — DX: Prediabetes: R73.03

## 2018-06-13 HISTORY — DX: Low back pain: M54.5

## 2018-06-13 HISTORY — DX: Other chronic pain: G89.29

## 2018-06-13 HISTORY — DX: Presence of external hearing-aid: Z97.4

## 2018-06-13 HISTORY — DX: Other intervertebral disc degeneration, lumbar region without mention of lumbar back pain or lower extremity pain: M51.369

## 2018-06-13 HISTORY — DX: Other intervertebral disc degeneration, lumbar region: M51.36

## 2018-06-13 LAB — CBC WITH DIFFERENTIAL/PLATELET
Abs Immature Granulocytes: 0.15 10*3/uL — ABNORMAL HIGH (ref 0.00–0.07)
Basophils Absolute: 0 10*3/uL (ref 0.0–0.1)
Basophils Relative: 0 %
Eosinophils Absolute: 0.2 10*3/uL (ref 0.0–0.5)
Eosinophils Relative: 1 %
HCT: 33.1 % — ABNORMAL LOW (ref 39.0–52.0)
Hemoglobin: 11 g/dL — ABNORMAL LOW (ref 13.0–17.0)
Immature Granulocytes: 1 %
Lymphocytes Relative: 12 %
Lymphs Abs: 1.5 10*3/uL (ref 0.7–4.0)
MCH: 32.7 pg (ref 26.0–34.0)
MCHC: 33.2 g/dL (ref 30.0–36.0)
MCV: 98.5 fL (ref 80.0–100.0)
Monocytes Absolute: 1.7 10*3/uL — ABNORMAL HIGH (ref 0.1–1.0)
Monocytes Relative: 14 %
Neutro Abs: 8.6 10*3/uL — ABNORMAL HIGH (ref 1.7–7.7)
Neutrophils Relative %: 72 %
Platelets: 507 10*3/uL — ABNORMAL HIGH (ref 150–400)
RBC: 3.36 MIL/uL — ABNORMAL LOW (ref 4.22–5.81)
RDW: 12.3 % (ref 11.5–15.5)
WBC: 12.1 10*3/uL — ABNORMAL HIGH (ref 4.0–10.5)
nRBC: 0.2 % (ref 0.0–0.2)

## 2018-06-13 LAB — COMPREHENSIVE METABOLIC PANEL
ALT: 17 U/L (ref 0–44)
AST: 23 U/L (ref 15–41)
Albumin: 4 g/dL (ref 3.5–5.0)
Alkaline Phosphatase: 48 U/L (ref 38–126)
Anion gap: 9 (ref 5–15)
BUN: 13 mg/dL (ref 8–23)
CO2: 25 mmol/L (ref 22–32)
Calcium: 10 mg/dL (ref 8.9–10.3)
Chloride: 93 mmol/L — ABNORMAL LOW (ref 98–111)
Creatinine, Ser: 0.53 mg/dL — ABNORMAL LOW (ref 0.61–1.24)
GFR calc Af Amer: >60 mL/min (ref >60)
GFR calc non Af Amer: >60 mL/min (ref >60)
Glucose, Bld: 100 mg/dL — ABNORMAL HIGH (ref 70–99)
Potassium: 4.4 mmol/L (ref 3.5–5.1)
Sodium: 127 mmol/L — ABNORMAL LOW (ref 135–145)
Total Bilirubin: 0.6 mg/dL (ref 0.3–1.2)
Total Protein: 6.8 g/dL (ref 6.5–8.1)

## 2018-06-13 LAB — HEMOGLOBIN A1C
Hgb A1c MFr Bld: 6.1 % — ABNORMAL HIGH (ref 4.8–5.6)
Mean Plasma Glucose: 128.37 mg/dL

## 2018-06-13 LAB — PROTIME-INR
INR: 1.02
Prothrombin Time: 13.3 seconds (ref 11.4–15.2)

## 2018-06-13 LAB — APTT: aPTT: 51 seconds — ABNORMAL HIGH (ref 24–36)

## 2018-06-13 NOTE — Telephone Encounter (Signed)
Dr Rush Landmark there is no availability next Wednesday either.    Please advise

## 2018-06-13 NOTE — Progress Notes (Addendum)
EKG dated 05-22-2018 in epic. Chest CT dated 04-16-2018 in epic. Cardiology clearance/ lov note , dr berry, dated 05-22-2018 in epic.  ECHO dated 05-29-2018 in epic.  ADDENDUM:  PTT and CMP results dated 06-13-2018 routed to dr gioffre in epic.  Final back xray dated 06-13-2018 in epic.

## 2018-06-13 NOTE — Telephone Encounter (Signed)
----- Message from Irving Copas., MD sent at 06/11/2018 11:49 AM EDT ----- Regarding: RE: Fiducial Markers Next Wednesday, can we see if there is availability at 1200 to do a procedure at [facility]? If so, then [place], we can place the patient there. As well, we may then need to adjust some timing of the [facility] patients, but can see if that works. Bryson Ha, we will try and see what we can do to get it in earlier. Thanks. Gabe ----- Message ----- From: Timothy Lasso, RN Sent: 06/11/2018  10:57 AM EDT To: Hayden Pedro, PA-C, # Subject: RE: Fiducial Markers                           FYI:  I have been unable to reach either of the patients on for Wednesday to move to another day.   ----- Message ----- From: Hayden Pedro, PA-C Sent: 06/10/2018  11:39 AM EDT To: Timothy Lasso, RN, Irving Copas., MD Subject: RE: Fiducial Markers                           Thank you guys!!! ----- Message ----- From: Irving Copas., MD Sent: 06/10/2018  11:00 AM EDT To: Hayden Pedro, PA-C, # Subject: RE: Fiducial Markers                           Josephus Harriger, See if we can move any of the procedures on Wednesday to next available. Third patient is probably the more ideal or the second. Thanks. Let me know, I'll be away the end of this week and early next week so that is why I don't have as much time. Thanks. Gabe ----- Message ----- From: Timothy Lasso, RN Sent: 06/10/2018   8:06 AM EDT To: Hayden Pedro, PA-C, # Subject: RE: Fiducial Markers                           Unfortunately that is the soonest appt that is available.  I will send to Dr Rush Landmark to review to see if he wants to move some patients around.   Dr Rush Landmark please advise ----- Message ----- From: Hayden Pedro, PA-C Sent: 06/10/2018   5:46 AM EDT To: Timothy Lasso, RN Subject: FW: Fiducial Markers                           Ragna Kramlich- is it not possible for this  procedure to be done sooner? He has known pancreas cancer and we don't want to delay him too much. He's also waiting on back surgery which won't happen until he is done with radiation. Thanks, Bryson Ha  ----- Message ----- From: Irving Copas., MD Sent: 06/04/2018  12:49 PM EDT To: Milus Banister, MD, # Subject: RE: Fiducial Markers                           Mairen Wallenstein, Can you look into my schedule and tell me when I have an open slot for EUS? I'm away the end of next week until mid-portion of the the beginning of November. I wanted to try and coordinate this so Linna Hoff could be a part of the first couple of these. If not, maybe if  Linna Hoff has an open slot for a procedure, I could find a way to wrangle myself out of clinic to help when I am around. Let us know so we can work on this. Thanks. Gabe  ----- Message ----- From: Hayden Pedro, PA-C Sent: 06/04/2018  12:26 PM EDT To: Milus Banister, MD, # Subject: Fiducial Markers                               Hi there! We have our first pt with early pancreas cancer who's not a great surgical candidate due to age/comorbidities. He is also just hoping to avoid whipple due to recovery. Could you guys possibly see him and put in fiducials for his pancreatic body tumor for [place] to move forward with SBRT?  Thanks, Bryson Ha

## 2018-06-14 ENCOUNTER — Telehealth: Payer: Self-pay | Admitting: Gastroenterology

## 2018-06-14 ENCOUNTER — Telehealth: Payer: Self-pay | Admitting: *Deleted

## 2018-06-14 NOTE — Telephone Encounter (Signed)
Tammy from Dr. Charlestine Night office called requesting a clearance letter for pt's lumbar surgery on  06/18/18.  Per Tammy, pt's insurance needed this letter for surgery approval.  Letter should state that :  Pt is clear from lung cancer;  Pt does not need chemo treatments at present, and can have lumbar surgery. Letter can be faxed to  [facility]      [phone].  Spoke with Tammy and informed her that Dr. Julien Nordmann is not in office today.  Message will be forwarded to MD for review on Monday  06/17/18. Tammy's    Phone     [phone].

## 2018-06-14 NOTE — Telephone Encounter (Signed)
The pt was advised that the reason for the EUS is to put in fiducials for his pancreatic body tumor  to move forward with SBRT.  The pt has been advised of the information and verbalized understanding.

## 2018-06-14 NOTE — Telephone Encounter (Signed)
Per message from Dr Rush Landmark the pt on for 11/6 can be moved to 12/9.  This pt will be moved into that spot. I will get that moved and the other pt moved and notified on Monday

## 2018-06-14 NOTE — Telephone Encounter (Signed)
Thanks for trying.  If there isn't Anesthesia availability that will be difficult. I think the last chance of getting this 1-week earlier than scheduled is to move the 730 case on Monday the 11th to another day and get this patient on that. I am fine with that as the other patient has a stent that is functioning currently. Please ask to reschedule that patient and get this one on. Thank you for trying Patty. Chester Holstein

## 2018-06-14 NOTE — Telephone Encounter (Signed)
Addendum : Letter also needs to state when pt was diagnosed with lung cancer;  Pt will start treatment for pancreatic cancer after surgery.

## 2018-06-14 NOTE — Telephone Encounter (Signed)
Dr Rush Landmark if we move the pt from 11/6 to make room for this pt it will move the current pt to 12/9.  Is that going to be too far out for that pt??

## 2018-06-17 MED ORDER — BUPIVACAINE LIPOSOME 1.3 % IJ SUSP
20.0000 mL | INTRAMUSCULAR | Status: DC
  Filled 2018-06-17: qty 20

## 2018-06-18 ENCOUNTER — Encounter (HOSPITAL_COMMUNITY): Payer: Self-pay | Admitting: Anesthesiology

## 2018-06-18 ENCOUNTER — Encounter (HOSPITAL_COMMUNITY): Admission: RE | Payer: Self-pay | Source: Ambulatory Visit

## 2018-06-18 ENCOUNTER — Ambulatory Visit (HOSPITAL_COMMUNITY): Admission: RE | Admit: 2018-06-18 | Payer: PPO | Source: Ambulatory Visit | Admitting: Orthopedic Surgery

## 2018-06-18 ENCOUNTER — Telehealth: Payer: Self-pay

## 2018-06-18 SURGERY — LUMBAR LAMINECTOMY/DECOMPRESSION MICRODISCECTOMY
Anesthesia: General

## 2018-06-18 NOTE — Anesthesia Preprocedure Evaluation (Deleted)
Anesthesia Evaluation    Reviewed: Allergy & Precautions, Patient's Chart, lab work & pertinent test results  History of Anesthesia Complications Negative for: history of anesthetic complications  Airway        Dental   Pulmonary former smoker,   Lung cancer s/p chemotherapy           Cardiovascular hypertension, Pt. on medications    '19 TTE - EF 55% to 60%. Grade 1 diastolic dysfunction. Trivial AI. PA peak pressure: 31 mm Hg    Neuro/Psych  Neuromuscular disease negative psych ROS   GI/Hepatic Neg liver ROS, hiatal hernia, GERD  Medicated,  Endo/Other   Pancreatic cancer not yet started on chemotherapy (not surgical candidate)  Borderline DM Hyponatremia Hypochloremia   Renal/GU negative Renal ROS     Musculoskeletal  (+) Arthritis ,   Abdominal   Peds  Hematology  (+) anemia ,   Anesthesia Other Findings   Reproductive/Obstetrics                            Anesthesia Physical Anesthesia Plan  ASA: IV  Anesthesia Plan: General   Post-op Pain Management:    Induction: Intravenous  PONV Risk Score and Plan: 3 and Treatment may vary due to age or medical condition, Ondansetron and Propofol infusion  Airway Management Planned: Oral ETT  Additional Equipment: None  Intra-op Plan:   Post-operative Plan: Extubation in OR  Informed Consent:   Plan Discussed with: CRNA and Anesthesiologist  Anesthesia Plan Comments:         Anesthesia Quick Evaluation

## 2018-06-18 NOTE — Telephone Encounter (Signed)
Received a call from ENDO and the procedure can be moved to 11/6 3 pm, however, Dr Rush Landmark can not accommodate the case.

## 2018-06-19 ENCOUNTER — Telehealth: Payer: Self-pay | Admitting: *Deleted

## 2018-06-20 ENCOUNTER — Telehealth: Payer: Self-pay | Admitting: Gastroenterology

## 2018-06-20 NOTE — Telephone Encounter (Signed)
Sean Cooke the pt is very confused as to why your office referred him for the EUS.  Can someone call him to discuss?  Thank you

## 2018-06-21 ENCOUNTER — Other Ambulatory Visit: Payer: Self-pay | Admitting: Medical Oncology

## 2018-06-21 ENCOUNTER — Encounter: Payer: Self-pay | Admitting: Internal Medicine

## 2018-06-21 ENCOUNTER — Telehealth: Payer: Self-pay | Admitting: Medical Oncology

## 2018-06-21 ENCOUNTER — Inpatient Hospital Stay: Payer: PPO

## 2018-06-21 ENCOUNTER — Other Ambulatory Visit: Payer: Self-pay | Admitting: Internal Medicine

## 2018-06-21 ENCOUNTER — Inpatient Hospital Stay: Payer: PPO | Attending: Internal Medicine | Admitting: Internal Medicine

## 2018-06-21 VITALS — BP 136/60 | HR 73 | Temp 98.2°F | Resp 18 | Ht 68.0 in | Wt 158.2 lb

## 2018-06-21 DIAGNOSIS — D689 Coagulation defect, unspecified: Secondary | ICD-10-CM

## 2018-06-21 DIAGNOSIS — D72829 Elevated white blood cell count, unspecified: Secondary | ICD-10-CM | POA: Insufficient documentation

## 2018-06-21 DIAGNOSIS — C786 Secondary malignant neoplasm of retroperitoneum and peritoneum: Secondary | ICD-10-CM | POA: Insufficient documentation

## 2018-06-21 DIAGNOSIS — C3411 Malignant neoplasm of upper lobe, right bronchus or lung: Secondary | ICD-10-CM | POA: Diagnosis not present

## 2018-06-21 DIAGNOSIS — C250 Malignant neoplasm of head of pancreas: Secondary | ICD-10-CM | POA: Insufficient documentation

## 2018-06-21 DIAGNOSIS — Z79899 Other long term (current) drug therapy: Secondary | ICD-10-CM | POA: Diagnosis not present

## 2018-06-21 DIAGNOSIS — Z9221 Personal history of antineoplastic chemotherapy: Secondary | ICD-10-CM | POA: Insufficient documentation

## 2018-06-21 LAB — CMP (CANCER CENTER ONLY)
ALK PHOS: 91 U/L (ref 38–126)
ALT: 50 U/L — AB (ref 0–44)
AST: 40 U/L (ref 15–41)
Albumin: 3.4 g/dL — ABNORMAL LOW (ref 3.5–5.0)
Anion gap: 6 (ref 5–15)
BUN: 14 mg/dL (ref 8–23)
CALCIUM: 9.8 mg/dL (ref 8.9–10.3)
CO2: 28 mmol/L (ref 22–32)
CREATININE: 0.75 mg/dL (ref 0.61–1.24)
Chloride: 98 mmol/L (ref 98–111)
Glucose, Bld: 110 mg/dL — ABNORMAL HIGH (ref 70–99)
Potassium: 4.5 mmol/L (ref 3.5–5.1)
Sodium: 132 mmol/L — ABNORMAL LOW (ref 135–145)
TOTAL PROTEIN: 6.1 g/dL — AB (ref 6.5–8.1)
Total Bilirubin: 0.4 mg/dL (ref 0.3–1.2)

## 2018-06-21 LAB — CBC WITH DIFFERENTIAL (CANCER CENTER ONLY)
Abs Immature Granulocytes: 0.18 10*3/uL — ABNORMAL HIGH (ref 0.00–0.07)
BASOS ABS: 0 10*3/uL (ref 0.0–0.1)
BASOS PCT: 0 %
EOS ABS: 0.2 10*3/uL (ref 0.0–0.5)
EOS PCT: 1 %
HEMATOCRIT: 29.9 % — AB (ref 39.0–52.0)
Hemoglobin: 10.2 g/dL — ABNORMAL LOW (ref 13.0–17.0)
Immature Granulocytes: 1 %
Lymphocytes Relative: 7 %
Lymphs Abs: 1.6 10*3/uL (ref 0.7–4.0)
MCH: 33.4 pg (ref 26.0–34.0)
MCHC: 34.1 g/dL (ref 30.0–36.0)
MCV: 98 fL (ref 80.0–100.0)
Monocytes Absolute: 1.7 10*3/uL — ABNORMAL HIGH (ref 0.1–1.0)
Monocytes Relative: 8 %
NRBC: 0 % (ref 0.0–0.2)
Neutro Abs: 17.4 10*3/uL — ABNORMAL HIGH (ref 1.7–7.7)
Neutrophils Relative %: 83 %
PLATELETS: 381 10*3/uL (ref 150–400)
RBC: 3.05 MIL/uL — AB (ref 4.22–5.81)
RDW: 12.6 % (ref 11.5–15.5)
WBC: 21 10*3/uL — AB (ref 4.0–10.5)

## 2018-06-21 LAB — APTT: aPTT: 59 seconds — ABNORMAL HIGH (ref 24–36)

## 2018-06-21 LAB — PROTIME-INR
INR: 1.09
Prothrombin Time: 14 seconds (ref 11.4–15.2)

## 2018-06-21 NOTE — Telephone Encounter (Signed)
Requests his med list-mailed to pt

## 2018-06-21 NOTE — Progress Notes (Signed)
[facility] [phone]   [phone]  OFFICE PROGRESS NOTE  [name], [place] [address] [place] [address] [place] [address]  DIAGNOSIS:   1) newly diagnosed pancreatic adenocarcinoma in September 2019 2) Stage IIIB (T1a, N3, M0) non-small cell lung cancer, adenocarcinoma with negative EGFR, ALK, ROS 1 but positive RET mutations presented with right middle lobe lung nodule in addition to mediastinal and left supraclavicular lymphadenopathy diagnosed in November 2016.  PRIOR THERAPY:  1) Systemic chemotherapy with carboplatin for AUC of 5 and Alimta 500 MG/M2 every 3 weeks status post 6 cycles with partial response. 2) Maintenance systemic chemotherapy with single agent Alimta 500 MG/M2 every 3 weeks, status post 15 cycles.  CURRENT THERAPY: Observation.  INTERVAL HISTORY: Sean Cooke 84 y.o. male returns to the clinic today for follow-up visit.  The patient is feeling fine today with no concerning complaints except for the persistent back pain.  He was seen by Dr. Gladstone Lighter for consideration of surgical intervention.  Preoperative blood work showed elevated PTT.  The patient was referred to me today for evaluation and recommendation regarding his condition.  He is feeling fine with no bleeding issues.  He denied having any bleeding, bruises or ecchymosis.  He denied having any chest pain, shortness breath, cough or hemoptysis.  He denied having any recent infection.  He denied having any weight loss or night sweats.   MEDICAL HISTORY: Past Medical History:  Diagnosis Date  . Anemia   . Arthritis   . Benign localized prostatic hyperplasia with lower urinary tract symptoms (LUTS)   . Bilateral edema of lower extremity   . Borderline diabetes   . Chronic low back pain   . DDD (degenerative disc disease), lumbar   . Degenerative joint disease   . Diverticulosis   . Duodenal diverticulum   . Encounter for antineoplastic chemotherapy 07/2015    lung cancer--- per pt completed chemo 06/ 2018  . Fatigue   . Generalized weakness   . GERD (gastroesophageal reflux disease)   . Hemorrhoids   . Herniated intervertebral disc of lumbar spine    L4-5  . Hiatal hernia   . History of adenomatous polyp of colon   . Hypertension   . Lumbar stenosis    L4-5  . Lung cancer [facility]) oncologist-- dr Julien Nordmann   dx 11/ 2016--- Stage IIIB, (T1a N3 M0)-- non small cell adenocarcinoma of the right middle lobe, started systemic chemothearpy 07-2015 , and per pt completed chemo 06/ 2018  . Pancreatic cancer [facility]) oncologist-  dr Julien Nordmann   new dx 09/ 2019  . Poor dental hygiene   . Wears hearing aid in both ears     ALLERGIES:  is allergic to benzonatate; lyrica [pregabalin]; gabapentin; tizanidine; penicillins; and tramadol.  MEDICATIONS:  Current Outpatient Medications  Medication Sig Dispense Refill  . amLODipine (NORVASC) 5 MG tablet Take 5 mg by mouth every evening.     Marland Kitchen antiseptic oral rinse (BIOTENE) LIQD 15 mLs by Mouth Rinse route 5 (five) times daily as needed for dry mouth.     Marland Kitchen aspirin 81 MG tablet Take 81 mg by mouth at bedtime.     . betamethasone dipropionate (DIPROLENE) 0.05 % cream Apply 1 application topically daily as needed (irritation).     . Calcium Carb-Cholecalciferol (CALCIUM-VITAMIN D) 500-200 MG-UNIT tablet Take 1 tablet by mouth 2 (two) times daily. Morning & after supper    . cromolyn (OPTICROM) 4 %  ophthalmic solution Place 1 drop into both eyes 4 (four) times daily. Morning, noon, 1800, & bedtime.    Marland Kitchen denosumab (PROLIA) 60 MG/ML SOLN injection Inject 60 mg into the skin every 6 (six) months. Administer in upper arm, thigh, or abdomen    . famotidine (PEPCID) 20 MG tablet Take 20 mg by mouth 2 (two) times daily.    . ferrous sulfate 325 (65 FE) MG tablet Take 325 mg by mouth 2 (two) times daily with a meal. Morning & after supper    . guaiFENesin (MUCINEX) 600 MG 12 hr tablet Take 600 mg by mouth 2 (two) times daily.      Marland Kitchen ibuprofen (ADVIL,MOTRIN) 800 MG tablet Take 800 mg by mouth 3 (three) times daily after meals.     . lidocaine-prilocaine (EMLA) cream Apply 1 application topically as needed. (Patient not taking: Reported on 06/13/2018) 30 g 3  . Liniments (SALONPAS ARTHRITIS PAIN RELIEF EX) Apply 1 patch topically daily as needed (pain).     Marland Kitchen loperamide (IMODIUM) 2 MG capsule Take 2 mg by mouth as needed for diarrhea or loose stools. Reported on 03/01/2016    . loratadine (CLARITIN) 10 MG tablet Take 10 mg by mouth every morning.     Marland Kitchen losartan (COZAAR) 100 MG tablet Take 100 mg by mouth every morning.     . Multiple Vitamin (MULTIVITAMIN) tablet Take 1 tablet by mouth daily.    . Omega-3 Fatty Acids (FISH OIL) 1000 MG CAPS Take 1,000 mg by mouth daily.     Marland Kitchen oxybutynin (DITROPAN) 5 MG tablet Take 5 mg by mouth at bedtime.     . pantoprazole (PROTONIX) 40 MG tablet Take 40 mg by mouth every morning.     . polyethylene glycol (MIRALAX / GLYCOLAX) packet Take 17 g by mouth daily as needed (constipation.).     Marland Kitchen Probiotic Product (ALIGN) 4 MG CAPS Take 4 mg by mouth daily.     Marland Kitchen PROCTOSOL HC 2.5 % rectal cream Place 1 application rectally 2 (two) times daily as needed (hemorrhoids).   0  . saw palmetto 160 MG capsule Take 160 mg by mouth daily.    . simethicone (MYLICON) 80 MG chewable tablet Chew 80 mg by mouth every 6 (six) hours as needed for flatulence.    . Tamsulosin HCl (FLOMAX) 0.4 MG CAPS Take 0.4 mg by mouth every morning.     . triamcinolone cream (KENALOG) 0.1 % Apply 1 application topically daily as needed (irritation).    . Turmeric 500 MG TABS Take 500 mg by mouth daily after supper.      No current facility-administered medications for this visit.     SURGICAL HISTORY:  Past Surgical History:  Procedure Laterality Date  . CATARACT EXTRACTION W/ INTRAOCULAR LENS  IMPLANT, BILATERAL  2004  . EUS N/A 05/16/2018   Procedure: UPPER ENDOSCOPIC ULTRASOUND (EUS) RADIAL;  Surgeon: Milus Banister, MD;  Location: [facility];  Service: Endoscopy;  Laterality: N/A;  . FINE NEEDLE ASPIRATION N/A 05/16/2018   Procedure: FINE NEEDLE ASPIRATION (FNA) LINEAR;  Surgeon: Milus Banister, MD;  Location: [facility];  Service: Endoscopy;  Laterality: N/A;  . INGUINAL HERNIA REPAIR  1980s   unilateral , pt unsure which side  . LAPAROSCOPIC CHOLECYSTECTOMY  07-02-2003   dr gerkin _0   . SPLENECTOMY  10-11-2000   _1    via exploratory laparotomy for blunt abdominal trauma  . TRANSURETHRAL RESECTION OF PROSTATE  1988    REVIEW OF SYSTEMS:  A comprehensive review of systems was negative except for: Constitutional: positive for fatigue Musculoskeletal: positive for arthralgias and back pain   PHYSICAL EXAMINATION: General appearance: alert, cooperative, fatigued and no distress Head: Normocephalic, without obvious abnormality, atraumatic Neck: no adenopathy, no JVD, supple, symmetrical, trachea midline and thyroid not enlarged, symmetric, no tenderness/mass/nodules Lymph nodes: Cervical, supraclavicular, and axillary nodes normal. Resp: clear to auscultation bilaterally Back: symmetric, no curvature. ROM normal. No CVA tenderness. Cardio: regular rate and rhythm, S1, S2 normal, no murmur, click, rub or gallop GI: soft, non-tender; bowel sounds normal; no masses,  no organomegaly Extremities: extremities normal, atraumatic, no cyanosis or edema  ECOG PERFORMANCE STATUS: 1 - Symptomatic but completely ambulatory  Blood pressure 136/60, pulse 73, temperature 98.2 F (36.8 C), temperature source Oral, resp. rate 18, height _0  (1.727 m), weight 158 lb 3.2 oz (71.8 kg), SpO2 99 %.  LABORATORY DATA: Lab Results  Component Value Date   WBC 21.0 (H) 06/21/2018   HGB 10.2 (L) 06/21/2018   HCT 29.9 (L) 06/21/2018   MCV 98.0 06/21/2018   PLT 381 06/21/2018      Chemistry      Component Value Date/Time   NA 127 (L) 06/13/2018 1147   NA 133 (L) 06/15/2017 0816   K 4.4  06/13/2018 1147   K 4.1 06/15/2017 0816   CL 93 (L) 06/13/2018 1147   CO2 25 06/13/2018 1147   CO2 27 06/15/2017 0816   BUN 13 06/13/2018 1147   BUN 9.8 06/15/2017 0816   CREATININE 0.53 (L) 06/13/2018 1147   CREATININE 0.65 05/31/2018 0927   CREATININE 0.7 06/15/2017 0816      Component Value Date/Time   CALCIUM 10.0 06/13/2018 1147   CALCIUM 10.2 06/15/2017 0816   ALKPHOS 48 06/13/2018 1147   ALKPHOS 65 06/15/2017 0816   AST 23 06/13/2018 1147   AST 20 05/31/2018 0927   AST 23 06/15/2017 0816   ALT 17 06/13/2018 1147   ALT 14 05/31/2018 0927   ALT 22 06/15/2017 0816   BILITOT 0.6 06/13/2018 1147   BILITOT 0.6 05/31/2018 0927   BILITOT 0.64 06/15/2017 0816       RADIOGRAPHIC STUDIES: Dg Lumbar Spine 2-3 Views  Result Date: 06/13/2018 CLINICAL DATA:  Preop lumbar spine.  Low back pain. EXAM: LUMBAR SPINE - 2-3 VIEW COMPARISON:  Lumbar spine CT dated 04/24/2018. FINDINGS: Based on lumbar spine CT of 04/24/2018, there are 5 lumbar type vertebral bodies. Degenerative changes again noted within the lower lumbar spine, at least moderate in degree with associated disc space narrowings, endplate sclerosis, osseous spurring and degenerative facet hypertrophy. Alignment is stable. Atherosclerotic changes are again seen along the walls of the infrarenal abdominal aorta. Cholecystectomy clips in the RIGHT upper quadrant. Visualized paravertebral soft tissues are otherwise unremarkable. IMPRESSION: 1. Five lumbar type vertebral bodies. Vertebral bodies numbered on the AP and lateral images per request. 2. Degenerative spondylosis of the lower lumbar spine, stable compared to recent lumbar spine CT of 04/24/2018. 3.  Aortic Atherosclerosis (ICD10-I70.0). Electronically Signed   By: Franki Cabot M.D.   On: 06/13/2018 16:10    ASSESSMENT AND PLAN:  This is a very pleasant 84 years old white male with:  1) recently diagnosed pancreatic adenocarcinoma in September 2019.  He is expected to  undergo curative radiotherapy under the care of Dr. Lisbeth Renshaw later this month. 2) stage IIIB non-small cell lung cancer, adenocarcinoma status post induction systemic chemotherapy with carboplatin and Alimta followed by maintenance treatment with single agent  Alimta status post 15 cycles and has been tolerating the treatment well except for fatigue and weakness. He is currently on observation and feeling fine with no concerning complaints. 3) chronic back pain with degenerative disc disease, the patient is a scheduled for surgical intervention by Dr. Gladstone Lighter.  Preoperative blood work was concerning for coagulopathy. 4) elevated PTT.  I order several studies today including mixing studies as well as von Willebrand panel for evaluation of his condition. 5) leukocytosis, unclear etiology.  The patient denied having any recent infection.  He is not currently on any steroid treatment. I would consider repeating his CBC in 1-2 weeks for reevaluation. The patient will come back for follow-up visit as previously scheduled.  He was advised to call if he has any concerning symptoms in the interval. The patient voices understanding of current disease status and treatment options and is in agreement with the current care plan. All questions were answered. The patient knows to call the clinic with any problems, questions or concerns. We can certainly see the patient much sooner if necessary.  Disclaimer: This note was dictated with voice recognition software. Similar sounding words can inadvertently be transcribed and may not be corrected upon review.

## 2018-06-21 NOTE — Telephone Encounter (Signed)
Faxed note from today.

## 2018-06-22 ENCOUNTER — Other Ambulatory Visit: Payer: Self-pay | Admitting: Nurse Practitioner

## 2018-06-24 ENCOUNTER — Telehealth: Payer: Self-pay

## 2018-06-24 NOTE — Telephone Encounter (Signed)
I have asked Barbie Haggis, [facility] to help with rescheduling and moving patients.

## 2018-06-24 NOTE — Telephone Encounter (Signed)
-----   Message from Milus Banister, MD sent at 06/24/2018 11:59 AM EST ----- Ok.   i'll be able to see my morning patients up to an including my 9am patient.  I'll be back in time to see my 11am patient.  Can put on in at 11;15 and 11;30 as well.    Can you move the others to any available openings in my schedule for the next 1-2 weeks.  If no spots available, then please offer appt with extenders.  Thanks    ----- Message ----- From: Timothy Lasso, RN Sent: 06/24/2018   8:33 AM EST To: Milus Banister, MD  [facility] and yes at 9:15 am ----- Message ----- From: Milus Banister, MD Sent: 06/24/2018   6:07 AM EST To: Timothy Lasso, RN, Irving Copas., MD  Harlan Ervine,   I need to be free from clinic to be able to observe his 11/18 EUS, fiducial placement with Dr. Rush Landmark.  Looks like it's scheduled for 9:15.  Is that correct? Is it at [facility]?  thanks

## 2018-06-25 ENCOUNTER — Telehealth: Payer: Self-pay | Admitting: Medical Oncology

## 2018-06-25 LAB — COAG STUDIES INTERP REPORT

## 2018-06-25 LAB — VON WILLEBRAND PANEL
COAGULATION FACTOR VIII: 96 % (ref 56–140)
Ristocetin Co-factor, Plasma: 133 % (ref 50–200)
Von Willebrand Antigen, Plasma: 137 % (ref 50–200)

## 2018-06-25 LAB — PTT FACTOR INHIBITOR (MIXING STUDY)
APTT 1 1 NORMAL PLASMA: 31.5 s — AB (ref 22.9–30.2)
APTT 11 NP MIX, 60 MIN, INCUB.: 30.2 s (ref 22.9–30.2)
APTT: 36.4 s — AB (ref 22.9–30.2)
aPTT 1:1 NP Incub. Mix Ctl: 31.2 s — ABNORMAL HIGH (ref 22.9–30.2)

## 2018-06-25 NOTE — Telephone Encounter (Signed)
asking if pt cleared for surgery. Sean Cooke ot advise

## 2018-06-25 NOTE — Telephone Encounter (Signed)
I am still waiting for the results of the von Willebrand panel.

## 2018-06-26 ENCOUNTER — Telehealth: Payer: Self-pay | Admitting: Radiation Oncology

## 2018-06-26 ENCOUNTER — Other Ambulatory Visit: Payer: Self-pay | Admitting: Internal Medicine

## 2018-06-26 DIAGNOSIS — D689 Coagulation defect, unspecified: Secondary | ICD-10-CM

## 2018-06-26 NOTE — Telephone Encounter (Signed)
He will need more evaluation for his coagulopathy before the back surgery.  If he can receive the curative radiation in the time being, that would be great.  Thank you.

## 2018-06-26 NOTE — Telephone Encounter (Signed)
I called the patient back after he was not wanting to schedule his fiducial marker placement with EUS with Patty at [facility]. I called to make sure he knew why we needed these placed to get ready for SBRT to the pancreas. He is still hopeful to have back surgery which he's in the process of being cleared, for. I tried to explain that we don't want to postpone treatment for his pancreas cancer, and that despite his symptoms, his back surgery I presume is considered elective. I will reach back out to Dr. Gladstone Lighter and Dr. Julien Nordmann to make sure we're all on the same page about the timing of approaching his cancer treatment and his surgery.

## 2018-06-27 ENCOUNTER — Telehealth: Payer: Self-pay | Admitting: Gastroenterology

## 2018-06-27 ENCOUNTER — Telehealth: Payer: Self-pay | Admitting: Medical Oncology

## 2018-06-27 NOTE — Telephone Encounter (Signed)
Sean Cooke, I have an EUS with Fine needle placement of fiducials scheduled for Monday so that Jenny Reichmann can begin radiation. With yourworkup ongoing in regards to his coagulopathy, do you think we should continue moving forward with this? It is basically an EUS with FNA without sending tissue for sampling purposes. Thus if we have concern about the potential need for factor replacement prior to a surgery, I wonder about proceeding.  I don't want [place] to keep pushing things back for him as we have waited a bit for a scheduled time for me, but we need to do what we all think is safe. Please let me know what your thoughts are Julien Nordmann and Jenny Reichmann. Thank you as always. Chester Holstein

## 2018-06-27 NOTE — Telephone Encounter (Signed)
Please see my reply to prior notation as we figure out moving forward with EUS next week or not based on Hematology discussion.

## 2018-06-27 NOTE — Telephone Encounter (Signed)
Coag labs- Tomorrow pt is scheduled at 945 for additional coag labs  . I do not know how long results take.

## 2018-06-27 NOTE — Telephone Encounter (Signed)
Lab appt made for tomorrow for coag studies. Dr Rush Landmark notified and pt notified.

## 2018-06-27 NOTE — Telephone Encounter (Signed)
Diana from Dr. Lew Dawes office called to advise that pt is being evaluated for coagulation problems, recent labs for PTT and ATT are in epic for Dr. Rush Landmark to review. Dr. Earlie Server is ordering more factors. He wanted to make Dr. Rush Landmark aware since pt is scheduled for a procedure next Monday 07/01/18 at [facility].

## 2018-06-27 NOTE — Telephone Encounter (Signed)
FYI:  Dr Rush Landmark

## 2018-06-27 NOTE — Telephone Encounter (Signed)
Per Dr Julien Nordmann "He will need more evaluation for his coagulopathy before the back surgery.  If he can receive the curative radiation in the time being, that would be great.  Thank you." This information was called to Tammy at Dr Gladstone Lighter , Dr Rush Landmark and cc Worthy Flank.

## 2018-06-28 ENCOUNTER — Inpatient Hospital Stay: Payer: PPO

## 2018-06-28 DIAGNOSIS — C250 Malignant neoplasm of head of pancreas: Secondary | ICD-10-CM | POA: Diagnosis not present

## 2018-06-28 DIAGNOSIS — D689 Coagulation defect, unspecified: Secondary | ICD-10-CM

## 2018-06-28 NOTE — Telephone Encounter (Signed)
Mohamed, I read the interpretation of the previous coagulation studies. I see that he came in for his labs today. If you think there is any chance that he should have factors before his EUS on Monday, and you need more time for the labs to return for interpretation, then please let me know over the weekend. I'll review periodically the chart on Sunday and if we need to hold on EUS with Fiducial placement we should if you feel that it is not safe to proceed with the ongoing coagulopathy evaluation. I can always call him on Monday to tell him not to come for procedure if you have concerns. Thanks again for reply.

## 2018-06-30 LAB — FACTOR 8 ASSAY: Coagulation Factor VIII: 134 % (ref 56–140)

## 2018-06-30 LAB — FACTOR 12 ASSAY: Factor XII Activity: 75 % (ref 50–150)

## 2018-06-30 LAB — FACTOR 13 ACTIVITY: Factor XIII, Qualitative: NORMAL

## 2018-06-30 LAB — FACTOR 11 ASSAY: Factor XI Activity: 67 % (ref 60–150)

## 2018-06-30 NOTE — Telephone Encounter (Signed)
His PTT was corrected with mixing studies concerning for a coagulation factor deficiency.  I ordered coagulation factor assay which is a still pending.  I will update you with the results and if there is any further action need to be taken.  Thank you.

## 2018-07-01 ENCOUNTER — Encounter (HOSPITAL_COMMUNITY): Admission: RE | Payer: Self-pay | Source: Ambulatory Visit

## 2018-07-01 ENCOUNTER — Encounter (HOSPITAL_COMMUNITY): Payer: Self-pay | Admitting: Anesthesiology

## 2018-07-01 ENCOUNTER — Other Ambulatory Visit: Payer: Self-pay | Admitting: Internal Medicine

## 2018-07-01 ENCOUNTER — Ambulatory Visit (HOSPITAL_COMMUNITY): Admission: RE | Admit: 2018-07-01 | Payer: PPO | Source: Ambulatory Visit | Admitting: Gastroenterology

## 2018-07-01 DIAGNOSIS — D689 Coagulation defect, unspecified: Secondary | ICD-10-CM

## 2018-07-01 SURGERY — UPPER ENDOSCOPIC ULTRASOUND (EUS) RADIAL
Anesthesia: Monitor Anesthesia Care

## 2018-07-01 NOTE — Telephone Encounter (Signed)
I spoke with Dr. Julien Nordmann. While awaiting for completion of coagulation evaluation, we will plan to hold on procedure. I called the patient on 11/17 at 900PM to let him know that we would hold on the 11/18 fiducial placement. I am happy to have this arranged on my Hospital week next week if necessary so that he can begin treatment as soon as able. We will await for communication with Dr. Julien Nordmann as to when the patient will be ready for procedure attempt.  Justice Britain, MD [facility] Office # [phone]

## 2018-07-01 NOTE — Anesthesia Preprocedure Evaluation (Deleted)
Anesthesia Evaluation    Reviewed: Allergy & Precautions, Patient's Chart, lab work & pertinent test results  History of Anesthesia Complications Negative for: history of anesthetic complications  Airway        Dental   Pulmonary neg pulmonary ROS, former smoker,   Lung cancer s/p chemo          Cardiovascular hypertension, Pt. on medications      Neuro/Psych negative neurological ROS  negative psych ROS   GI/Hepatic Neg liver ROS, hiatal hernia, GERD  Medicated, Pancreatic cancer    Endo/Other   Hyponatremia   Renal/GU negative Renal ROS    BPH     Musculoskeletal  (+) Arthritis ,   Abdominal   Peds  Hematology  S/p splenectomy    Anesthesia Other Findings   Reproductive/Obstetrics                             Anesthesia Physical Anesthesia Plan  ASA: IV  Anesthesia Plan: MAC   Post-op Pain Management:    Induction: Intravenous  PONV Risk Score and Plan: 1 and Propofol infusion and Treatment may vary due to age or medical condition  Airway Management Planned: Nasal Cannula and Natural Airway  Additional Equipment: None  Intra-op Plan:   Post-operative Plan:   Informed Consent:   Plan Discussed with: CRNA and Anesthesiologist  Anesthesia Plan Comments:         Anesthesia Quick Evaluation

## 2018-07-02 NOTE — Telephone Encounter (Signed)
Pt is calling to check the status of scheduling the EUS

## 2018-07-02 NOTE — Telephone Encounter (Signed)
I spoke with Sean Cooke and made her aware of the information below.  She verbalized understanding.         I spoke with Dr. Julien Nordmann. While awaiting for completion of coagulation evaluation, we will plan to hold on procedure. I called the patient on 11/17 at 900PM to let him know that we would hold on the 11/18 fiducial placement. I am happy to have this arranged on my Hospital week next week if necessary so that he can begin treatment as soon as able. We will await for communication with Dr. Julien Nordmann as to when the patient will be ready for procedure attempt.  Sean Britain, MD [facility] Office # [phone]

## 2018-07-03 ENCOUNTER — Ambulatory Visit: Payer: PPO | Admitting: Radiation Oncology

## 2018-07-03 ENCOUNTER — Ambulatory Visit: Payer: PPO | Attending: Radiation Oncology

## 2018-07-03 LAB — FACTOR 9 ASSAY: Coagulation Factor IX: 87 % (ref 60–177)

## 2018-07-03 NOTE — Telephone Encounter (Signed)
All the coagulation factors were reported back to be normal.  There is no clear explanation why his PTT is elevated.  I spoke to Dr. Beryle Beams, our coagulation expert.  He is still puzzled by this case for this patient who had no bleeding issues in the past and he is currently 1.  For safety reason he would recommend giving 4 units of fresh frozen plasma before his back surgery and 3 units of fresh frozen plasma 6 hours his back surgery. I am not sure if we have to do that many units for his fiducial placement for the pancreatic mass.  But it may be reasonable to give him 1 or 2 units before and after the procedure if there is any concern. The other option is not to do any of the surgical procedure at all for now and try to treat the patient conservatively.

## 2018-07-04 ENCOUNTER — Telehealth: Payer: Self-pay | Admitting: Gastroenterology

## 2018-07-04 ENCOUNTER — Other Ambulatory Visit: Payer: Self-pay

## 2018-07-04 ENCOUNTER — Telehealth: Payer: Self-pay | Admitting: Medical Oncology

## 2018-07-04 DIAGNOSIS — K8689 Other specified diseases of pancreas: Secondary | ICD-10-CM

## 2018-07-04 NOTE — Telephone Encounter (Signed)
I told Sean Cooke that our office did not call him.

## 2018-07-04 NOTE — Telephone Encounter (Signed)
Left message on machine to call back  FYI Dr Rush Landmark

## 2018-07-04 NOTE — Telephone Encounter (Signed)
Thank you Mohamed for the update. I think, to expedite him getting his radiation for his pancreatic cancer, at this point, it would be reasonable to proceed with the EUS. Patty, can we try and get this patient on at [facility] or [facility] for next week with 1 unit of FFP before his procedure and 1 unit of FFP to be given after the Fiducials are placed in effort to decrease his risk of bleeding as much as possible. I know that Thursday/Friday are not able to be scheduled for outpatients, so let's see if we can fit him in on Mon-Wed next week. Let me know Patty what we are able to come up with. Thanks.

## 2018-07-04 NOTE — Telephone Encounter (Signed)
See alternate phone note  

## 2018-07-04 NOTE — Telephone Encounter (Signed)
Thanks Patty!

## 2018-07-04 NOTE — Telephone Encounter (Signed)
EUS scheduled, pt instructed and medications reviewed.  Patient instructions mailed to home.  Patient to call with any questions or concerns.  

## 2018-07-04 NOTE — Telephone Encounter (Signed)
The pt has been scheduled for 07/08/18 at [facility] at 8:45 am.  Order for 1 unit FFP prior to EUS and 1 unit FFP after has been added as well. It was also added in the notes of the appt.

## 2018-07-05 ENCOUNTER — Encounter (HOSPITAL_COMMUNITY): Payer: Self-pay | Admitting: *Deleted

## 2018-07-05 ENCOUNTER — Other Ambulatory Visit: Payer: Self-pay

## 2018-07-05 DIAGNOSIS — E871 Hypo-osmolality and hyponatremia: Secondary | ICD-10-CM | POA: Diagnosis not present

## 2018-07-05 DIAGNOSIS — E559 Vitamin D deficiency, unspecified: Secondary | ICD-10-CM | POA: Diagnosis not present

## 2018-07-05 DIAGNOSIS — M810 Age-related osteoporosis without current pathological fracture: Secondary | ICD-10-CM | POA: Diagnosis not present

## 2018-07-08 ENCOUNTER — Ambulatory Visit (HOSPITAL_COMMUNITY): Payer: PPO | Admitting: Anesthesiology

## 2018-07-08 ENCOUNTER — Other Ambulatory Visit: Payer: Self-pay

## 2018-07-08 ENCOUNTER — Encounter (HOSPITAL_COMMUNITY)
Admission: RE | Disposition: A | Discharge status: Home / Self Care | Payer: Self-pay | Source: Ambulatory Visit | Attending: Gastroenterology

## 2018-07-08 ENCOUNTER — Ambulatory Visit (HOSPITAL_COMMUNITY)
Admission: RE | Admit: 2018-07-08 | Discharge: 2018-07-08 | Disposition: A | Discharge status: Home / Self Care | Payer: PPO | Source: Ambulatory Visit | Attending: Gastroenterology | Admitting: Gastroenterology

## 2018-07-08 ENCOUNTER — Encounter (HOSPITAL_COMMUNITY): Payer: Self-pay | Admitting: Gastroenterology

## 2018-07-08 DIAGNOSIS — I10 Essential (primary) hypertension: Secondary | ICD-10-CM | POA: Insufficient documentation

## 2018-07-08 DIAGNOSIS — B49 Unspecified mycosis: Secondary | ICD-10-CM | POA: Insufficient documentation

## 2018-07-08 DIAGNOSIS — Z87891 Personal history of nicotine dependence: Secondary | ICD-10-CM | POA: Insufficient documentation

## 2018-07-08 DIAGNOSIS — Z9081 Acquired absence of spleen: Secondary | ICD-10-CM | POA: Insufficient documentation

## 2018-07-08 DIAGNOSIS — K228 Other specified diseases of esophagus: Secondary | ICD-10-CM

## 2018-07-08 DIAGNOSIS — D649 Anemia, unspecified: Secondary | ICD-10-CM | POA: Diagnosis not present

## 2018-07-08 DIAGNOSIS — Z9221 Personal history of antineoplastic chemotherapy: Secondary | ICD-10-CM | POA: Insufficient documentation

## 2018-07-08 DIAGNOSIS — Z85118 Personal history of other malignant neoplasm of bronchus and lung: Secondary | ICD-10-CM | POA: Diagnosis not present

## 2018-07-08 DIAGNOSIS — C251 Malignant neoplasm of body of pancreas: Secondary | ICD-10-CM | POA: Diagnosis not present

## 2018-07-08 DIAGNOSIS — K449 Diaphragmatic hernia without obstruction or gangrene: Secondary | ICD-10-CM | POA: Diagnosis not present

## 2018-07-08 DIAGNOSIS — K219 Gastro-esophageal reflux disease without esophagitis: Secondary | ICD-10-CM | POA: Diagnosis not present

## 2018-07-08 DIAGNOSIS — K5710 Diverticulosis of small intestine without perforation or abscess without bleeding: Secondary | ICD-10-CM | POA: Diagnosis not present

## 2018-07-08 DIAGNOSIS — C259 Malignant neoplasm of pancreas, unspecified: Secondary | ICD-10-CM | POA: Insufficient documentation

## 2018-07-08 DIAGNOSIS — B379 Candidiasis, unspecified: Secondary | ICD-10-CM | POA: Diagnosis not present

## 2018-07-08 DIAGNOSIS — K3189 Other diseases of stomach and duodenum: Secondary | ICD-10-CM

## 2018-07-08 HISTORY — PX: ESOPHAGOGASTRODUODENOSCOPY (EGD) WITH PROPOFOL: SHX5813

## 2018-07-08 HISTORY — PX: EUS: SHX5427

## 2018-07-08 LAB — TYPE AND SCREEN
ABO/RH(D): O NEG
Antibody Screen: NEGATIVE

## 2018-07-08 LAB — ABO/RH: ABO/RH(D): O NEG

## 2018-07-08 SURGERY — UPPER ENDOSCOPIC ULTRASOUND (EUS) RADIAL
Anesthesia: Monitor Anesthesia Care

## 2018-07-08 MED ORDER — PROPOFOL 500 MG/50ML IV EMUL
INTRAVENOUS | Status: DC | PRN
  Administered 2018-07-08: 75 ug/kg/min via INTRAVENOUS

## 2018-07-08 MED ORDER — SODIUM CHLORIDE 0.9% IV SOLUTION: Freq: Once | INTRAVENOUS | Status: DC

## 2018-07-08 MED ORDER — LACTATED RINGERS IV SOLN
INTRAVENOUS | Status: DC
  Administered 2018-07-08: 1000 mL via INTRAVENOUS

## 2018-07-08 MED ORDER — PROPOFOL 10 MG/ML IV BOLUS
INTRAVENOUS | Status: AC
  Filled 2018-07-08: qty 40

## 2018-07-08 MED ORDER — PROPOFOL 10 MG/ML IV BOLUS
INTRAVENOUS | Status: AC
  Filled 2018-07-08: qty 20

## 2018-07-08 SURGICAL SUPPLY — 15 items

## 2018-07-08 NOTE — H&P (Addendum)
GASTROENTEROLOGY OUTPATIENT PROCEDURE H&P NOTE   Primary Care Physician: [name], PA-C  HPI: Sean Cooke is a 84 y.o. male who presents for EUS with fiducial placement.  Past Medical History:  Diagnosis Date  . Anemia   . Arthritis   . Benign localized prostatic hyperplasia with lower urinary tract symptoms (LUTS)   . Bilateral edema of lower extremity   . Borderline diabetes   . Chronic low back pain   . DDD (degenerative disc disease), lumbar   . Degenerative joint disease   . Diverticulosis   . Duodenal diverticulum   . Encounter for antineoplastic chemotherapy 07/2015   lung cancer--- per pt completed chemo 06/ 2018  . Fatigue   . Generalized weakness   . GERD (gastroesophageal reflux disease)   . Hemorrhoids   . Herniated intervertebral disc of lumbar spine    L4-5  . Hiatal hernia   . History of adenomatous polyp of colon   . Hypertension   . Lumbar stenosis    L4-5  . 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 Pancreatic cancer [facility]) oncologist-  dr Julien Nordmann   new dx 09/ 2019  . Poor dental hygiene   . Wears hearing aid in both ears    Past Surgical History:  Procedure Laterality Date  . CATARACT EXTRACTION W/ INTRAOCULAR LENS  IMPLANT, BILATERAL  2004  . EUS N/A 05/16/2018   Procedure: UPPER ENDOSCOPIC ULTRASOUND (EUS) RADIAL;  Surgeon: Milus Banister, MD;  Location: [facility] END[facility]COPY;  Service: Endoscopy;  Laterality: N/A;  . FINE NEEDLE ASPIRATION N/A 05/16/2018   Procedure: FINE NEEDLE ASPIRATION (FNA) LINEAR;  Surgeon: Milus Banister, MD;  Location: [facility]COPY;  Service: Endoscopy;  Laterality: N/A;  . INGUINAL HERNIA REPAIR  1980s   unilateral , pt unsure which side  . LAPAROSCOPIC CHOLECYSTECTOMY  07-02-2003   dr gerkin @[facility]   . SPLENECTOMY  10-11-2000   @[facility]    via exploratory laparotomy for blunt abdominal trauma  . TRANSURETHRAL RESECTION OF PROSTATE  1988   Current Facility-Administered Medications  Medication Dose Route Frequency Provider Last Rate Last Dose  . 0.9 %  sodium chloride infusion (Manually program via Guardrails IV Fluids)   Intravenous Once Mansouraty, Telford Nab., MD       Allergies  Allergen Reactions  . Benzonatate Other (See Comments)    dizziness  . Lyrica [Pregabalin] Diarrhea  . Gabapentin Other (See Comments)    constipation  . Tizanidine     Unknown, cannot remember what it did  . Penicillins Rash    REACTION: Rash Has patient had a PCN reaction causing immediate rash, facial/tongue/throat swelling, SOB or lightheadedness with hypotension: unknown Has patient had a PCN reaction causing severe rash involving mucus membranes or skin necrosis: unknown Has patient had a PCN reaction that required hospitalization : unknown Has patient had a PCN reaction occurring within the last 10 years: no, childhood allergy If all of the above answers are "NO", then may proceed with Cephalosporin use.   . Tramadol Other (See Comments)    constipation   Family History  Problem Relation Age of Onset  . Diabetes Father   . Colon cancer Neg Hx    Social History   Socioeconomic History  . Marital status: Married    Spouse name: Not on file  .  Number of children: 0  . Years of education: Not on file  . Highest education level: Not on file  Occupational History  . Occupation: retired  Scientific laboratory technician  . Financial resource strain: Not on file  . Food insecurity:    Worry: Not on file    Inability: Not on file  . Transportation needs:    Medical: No    Non-medical: No  Tobacco Use  . Smoking status: Former Smoker    Years: 30.00    Types: Cigarettes, Pipe    Last attempt to quit: 08/14/1988    Years since quitting: 29.9  . Smokeless tobacco: Never Used  . Tobacco comment: 06-13-2018 per pt quit cigarettes in 1960;  quit  pipe 1990  Substance and Sexual Activity  . Alcohol use: Not Currently    Alcohol/week: 0.0 standard drinks  . Drug use: Never  . Sexual activity: Not on file  Lifestyle  . Physical activity:    Days per week: Not on file    Minutes per session: Not on file  . Stress: Not on file  Relationships  . Social connections:    Talks on phone: Not on file    Gets together: Not on file    Attends religious service: Not on file    Active member of club or organization: Not on file    Attends meetings of clubs or organizations: Not on file    Relationship status: Not on file  . Intimate partner violence:    Fear of current or ex partner: Not on file    Emotionally abused: Not on file    Physically abused: Not on file    Forced sexual activity: Not on file  Other Topics Concern  . Not on file  Social History Narrative  . Not on file    Physical Exam: Vital signs in last 24 hours:     GEN: NAD EYE: Sclerae anicteric ENT: MMM CV: RR without R/Gs  RESP: CTAB posteriorly GI: Soft, NT/ND NEURO:  Alert & Oriented x 3  Lab Results: No results for input(s): WBC, HGB, HCT, PLT in the last 72 hours. BMET No results for input(s): NA, K, CL, CO2, GLUCOSE, BUN, CREATININE, CALCIUM in the last 72 hours. LFT No results for input(s): PROT, ALBUMIN, AST, ALT, ALKPHOS, BILITOT, BILIDIR, IBILI in the last 72 hours. PT/INR No results for input(s): LABPROT, INR in the last 72 hours.   Impression / Plan: This is a 84 y.o.male who presents for EUS with fiducial placement.  The risks of EUS including bleeding, infection, aspiration pneumonia and intestinal perforation were discussed as was the possibility it may not give a definitive diagnosis.  If a biopsy of the pancreas is done as part of the EUS, there is an additional risk of pancreatitis at the rate of about 1%.  It was explained that procedure related pancreatitis is typically mild, although can be severe and even life threatening, which is  why we do not perform random pancreatic biopsies and only biopsy a lesion we feel is concerning enough to warrant the risk.   The risks and benefits of endoscopic evaluation were discussed with the patient; these include but are not limited to the risk of perforation, infection, bleeding, missed lesions, lack of diagnosis, severe illness requiring hospitalization, as well as anesthesia and sedation related illnesses.  The patient is agreeable to proceed.    To decrease risk of bleeding after discussion with Hematology will plan 1 unit FFP prior/during procedure and  1 unit FFP after.    Justice Britain, MD [facility] Office # [phone]

## 2018-07-08 NOTE — Op Note (Signed)
[facility] Patient Name: Sean Cooke Procedure Date: 07/08/2018 MRN: [id number] Attending MD: Justice Britain , MD Date of Birth: [birth year] CSN: [id number] Age: 84 Admit Type: Outpatient Procedure:                Upper EUS Indications:              For evaluation of pancreatic adenocarcinoma,                            Fiducial Marker Placement Providers:                Justice Britain, MD, Carlyn Reichert, RN, Charolette Child, Technician, Anne Fu CRNA, CRNA Referring MD:             Fanny Bien. Sela Hua, Shona Simpson Medicines:                Monitored Anesthesia Care Complications:            No immediate complications. Estimated Blood Loss:     Estimated blood loss was minimal. Procedure:                Pre-Anesthesia Assessment:                           - Prior to the procedure, a History and Physical                            was performed, and patient medications and                            allergies were reviewed. The patient's tolerance of                            previous anesthesia was also reviewed. The risks                            and benefits of the procedure and the sedation                            options and risks were discussed with the patient.                            All questions were answered, and informed consent                            was obtained. Prior Anticoagulants: The patient has                            taken no previous anticoagulant or antiplatelet                            agents. ASA Grade Assessment: III - A patient with  severe systemic disease. After reviewing the risks                            and benefits, the patient was deemed in                            satisfactory condition to undergo the procedure.                           After obtaining informed consent, the endoscope was                            passed  under direct vision. Throughout the                            procedure, the patient's blood pressure, pulse, and                            oxygen saturations were monitored continuously. The                            [id number] ([id number]) Olympus Linear EUS was                            introduced through the mouth, and advanced to the                            second part of duodenum. The [id number]-[id number] ([id number])                            Olympus adult endoscope was introduced through the                            mouth, and advanced to the stomach for ultrasound                            examination. The upper EUS was somewhat difficult                            due to abnormal anatomy. Successful completion of                            the procedure was aided by performing the maneuvers                            documented (below) in this report. The patient                            tolerated the procedure. Scope In: Scope Out: Findings:      ENDOSCOPIC FINDING: :      White nummular lesions were noted in the entire esophagus. Cells for       cytology were obtained by brushing to rule out Candida.      A small hiatal hernia was  present.      A J-shaped deformity was found in the gastric body.      No other gross lesions were noted in the entire examined stomach.      A diverticulum was found in the area of the papilla.      Normal mucosa was found in the duodenal bulb, in the first portion of       the duodenum and in the second portion of the duodenum.      ENDOSONOGRAPHIC FINDING: :      An irregular mass was identified in the pancreatic body. The mass was       hypoechoic. The mass measured 23 mm by 15 mm in maximal cross-sectional       diameter. The outer margins were irregular. The remainder of the       pancreas was examined. The endosonographic appearance of parenchyma and       the upstream pancreatic duct indicated duct dilation and a maximum duct       diameter of 6  mm. Fiducial marker placement was performed. Once the       target lesion in the pancreatic body/tail region was identified a       preloaded marker in a 22 gauge needle was then deployed in the lesion.       This was repeated for a total of four markers.      There was no sign of significant endosonographic abnormality in the       common bile duct. The maximum diameter of the duct was 5 mm.      Endosonographic imaging in the visualized portion of the liver showed no       mass. Impression:               EGD Impression:                           - White nummular lesions in esophageal mucosa.                            Cells for cytology obtained to rule out Candida.                           - Small hiatal hernia.                           - Deformity in the gastric body.                           - No gross lesions in the stomach.                           - Duodenal diverticulum.                           - Normal mucosa was found in the duodenal bulb, in                            the first portion of the duodenum and in the second  portion of the duodenum.                           EUS Impression:                           - A mass was identified in the pancreatic body. A                            tissue diagnosis was obtained prior to this exam.                            This is adenocarcinoma. Fiducial markers were                            deployed in the lesion.                           - There was no sign of significant pathology in the                            common bile duct. Moderate Sedation:      Not Applicable - Patient had care per Anesthesia. Recommendation:           - The patient will be observed post-procedure,                            until all discharge criteria are met.                           - Discharge patient to home.                           - Patient has a contact number available for                             emergencies. The signs and symptoms of potential                            delayed complications were discussed with the                            patient. Return to normal activities tomorrow.                            Written discharge instructions were provided to the                            patient.                           - Observe patient's clinical course.                           - Monitor for signs/symptoms of pancreatitis,  perforation, bleeding.                           - Return to referring physician as previously                            scheduled.                           - The findings and recommendations were discussed                            with the patient.                           - The findings and recommendations were discussed                            with the patient's family. Procedure Code(s):        --- Professional ---                           [phone], Esophagogastroduodenoscopy, flexible,                            transoral; with transendoscopic ultrasound-guided                            transmural injection of diagnostic or therapeutic                            substance(s) (eg, anesthetic, neurolytic agent) or                            fiducial marker(s) (includes endoscopic ultrasound                            examination of the esophagus, stomach, and either                            the duodenum or a surgically altered stomach where                            the jejunum is examined distal to the anastomosis) Diagnosis Code(s):        --- Professional ---                           K22.8, Other specified diseases of esophagus                           K44.9, Diaphragmatic hernia without obstruction or                            gangrene                           K31.89, Other diseases of stomach and  duodenum                           C25.1, Malignant neoplasm of body of pancreas                            C25.9, Malignant neoplasm of pancreas, unspecified                           K57.10, Diverticulosis of small intestine without                            perforation or abscess without bleeding CPT copyright 2018 American Medical Association. All rights reserved. The codes documented in this report are preliminary and upon coder review may  be revised to meet current compliance requirements. Justice Britain, MD 07/08/2018 11:49:12 AM Number of Addenda: 0

## 2018-07-08 NOTE — Discharge Instructions (Signed)

## 2018-07-08 NOTE — Anesthesia Preprocedure Evaluation (Signed)
Anesthesia Evaluation  Patient identified by MRN, date of birth, ID band Patient awake    Reviewed: Allergy & Precautions, NPO status , Patient's Chart, lab work & pertinent test results  Airway Mallampati: II   Neck ROM: Full    Dental   Pulmonary former smoker,    breath sounds clear to auscultation       Cardiovascular hypertension,  Rhythm:Irregular Rate:Normal     Neuro/Psych    GI/Hepatic   Endo/Other    Renal/GU      Musculoskeletal   Abdominal   Peds  Hematology   Anesthesia Other Findings   Reproductive/Obstetrics                             Anesthesia Physical Anesthesia Plan  ASA: III  Anesthesia Plan: MAC   Post-op Pain Management:    Induction: Intravenous  PONV Risk Score and Plan: Ondansetron  Airway Management Planned: Natural Airway and Simple Face Mask  Additional Equipment:   Intra-op Plan:   Post-operative Plan:   Informed Consent: I have reviewed the patients History and Physical, chart, labs and discussed the procedure including the risks, benefits and alternatives for the proposed anesthesia with the patient or authorized representative who has indicated his/her understanding and acceptance.     Plan Discussed with: CRNA and Anesthesiologist  Anesthesia Plan Comments:         Anesthesia Quick Evaluation

## 2018-07-08 NOTE — Transfer of Care (Signed)
Immediate Anesthesia Transfer of Care Note  Patient: Sean Cooke  Procedure(s) Performed: Procedure(s): UPPER ENDOSCOPIC ULTRASOUND (EUS) RADIAL (N/A) FIDUCIAL MARKER PLACEMENT (N/A) ESOPHAGOGASTRODUODENOSCOPY (EGD) WITH PROPOFOL (N/A) ESOPHAGEAL BRUSHING  Patient Location: PACU  Anesthesia Type:MAC  Level of Consciousness:  sedated, patient cooperative and responds to stimulation  Airway & Oxygen Therapy:Patient Spontanous Breathing and Patient connected to face mask oxgen  Post-op Assessment:  Report given to PACU RN and Post -op Vital signs reviewed and stable  Post vital signs:  Reviewed and stable  Last Vitals:  Vitals:   07/08/18 1000 07/08/18 1134  BP: (!) 188/55   Pulse: 61   Resp: (!) 22   Temp: 36.6 C (P) 36.7 C  SpO2: 03%     Complications: No apparent anesthesia complications

## 2018-07-08 NOTE — Anesthesia Postprocedure Evaluation (Signed)
Anesthesia Post Note  Patient: Sean Cooke  Procedure(s) Performed: UPPER ENDOSCOPIC ULTRASOUND (EUS) RADIAL (N/A ) FIDUCIAL MARKER PLACEMENT (N/A ) ESOPHAGOGASTRODUODENOSCOPY (EGD) WITH PROPOFOL (N/A ) ESOPHAGEAL BRUSHING     Patient location during evaluation: Endoscopy Anesthesia Type: MAC Level of consciousness: awake and alert Pain management: pain level controlled Vital Signs Assessment: post-procedure vital signs reviewed and stable Respiratory status: spontaneous breathing, nonlabored ventilation, respiratory function stable and patient connected to nasal cannula oxygen Cardiovascular status: stable and blood pressure returned to baseline Postop Assessment: no apparent nausea or vomiting Anesthetic complications: no    Last Vitals:  Vitals:   07/08/18 1220 07/08/18 1240  BP: (!) 186/59 (!) 193/65  Pulse: (!) 55 (!) 54  Resp: 18 18  Temp: 36.6 C 36.7 C  SpO2: 92% 96%    Last Pain:  Vitals:   07/08/18 1240  TempSrc: Oral  PainSc:                  Rylynne Schicker COKER

## 2018-07-09 ENCOUNTER — Encounter: Payer: Self-pay | Admitting: Gastroenterology

## 2018-07-09 LAB — PREPARE FRESH FROZEN PLASMA

## 2018-07-09 LAB — BPAM FFP
BLOOD PRODUCT EXPIRATION DATE: 201911302359
BLOOD PRODUCT EXPIRATION DATE: 201911302359
ISSUE DATE / TIME: 201911250911
ISSUE DATE / TIME: 201911251158
Unit Type and Rh: 5100
Unit Type and Rh: 5100

## 2018-07-10 ENCOUNTER — Encounter (HOSPITAL_COMMUNITY): Payer: Self-pay | Admitting: Gastroenterology

## 2018-07-10 ENCOUNTER — Other Ambulatory Visit: Payer: Self-pay

## 2018-07-10 MED ORDER — FLUCONAZOLE 200 MG PO TABS: ORAL_TABLET | ORAL | 0 refills | Status: DC

## 2018-07-15 ENCOUNTER — Telehealth: Payer: Self-pay | Admitting: Gastroenterology

## 2018-07-15 NOTE — Telephone Encounter (Signed)
Pt called in about medication fluconazole (DIFLUCAN) 200 MG tablet  Advise he is having problems with the med.

## 2018-07-15 NOTE — Telephone Encounter (Signed)
The pt was concerned about his appt at the cancer center and wanted to know if he could eat or drink anything prior to the appt.  I provided the pt with the phone number to call the cancer center.  He also asked about diflucan causing constipation.  He was advised that he can use miralax as needed to maintain regular bowel movements.  The pt has been advised of the information and verbalized understanding.

## 2018-07-17 ENCOUNTER — Emergency Department (HOSPITAL_COMMUNITY): Payer: PPO

## 2018-07-17 ENCOUNTER — Emergency Department (HOSPITAL_COMMUNITY)
Admission: EM | Admit: 2018-07-17 | Discharge: 2018-07-17 | Disposition: A | Discharge status: Home / Self Care | Payer: PPO | Attending: Emergency Medicine | Admitting: Emergency Medicine

## 2018-07-17 ENCOUNTER — Emergency Department (EMERGENCY_DEPARTMENT_HOSPITAL)
Admit: 2018-07-17 | Discharge: 2018-07-17 | Disposition: A | Discharge status: Home / Self Care | Payer: PPO | Attending: Emergency Medicine | Admitting: Emergency Medicine

## 2018-07-17 ENCOUNTER — Encounter (HOSPITAL_COMMUNITY): Payer: Self-pay | Admitting: Emergency Medicine

## 2018-07-17 DIAGNOSIS — R2243 Localized swelling, mass and lump, lower limb, bilateral: Secondary | ICD-10-CM | POA: Diagnosis present

## 2018-07-17 DIAGNOSIS — K602 Anal fissure, unspecified: Secondary | ICD-10-CM | POA: Diagnosis not present

## 2018-07-17 DIAGNOSIS — Y9389 Activity, other specified: Secondary | ICD-10-CM | POA: Diagnosis not present

## 2018-07-17 DIAGNOSIS — R600 Localized edema: Secondary | ICD-10-CM | POA: Insufficient documentation

## 2018-07-17 DIAGNOSIS — Z87891 Personal history of nicotine dependence: Secondary | ICD-10-CM | POA: Diagnosis not present

## 2018-07-17 DIAGNOSIS — I10 Essential (primary) hypertension: Secondary | ICD-10-CM | POA: Diagnosis not present

## 2018-07-17 DIAGNOSIS — W19XXXA Unspecified fall, initial encounter: Secondary | ICD-10-CM

## 2018-07-17 DIAGNOSIS — Y998 Other external cause status: Secondary | ICD-10-CM | POA: Insufficient documentation

## 2018-07-17 DIAGNOSIS — K600 Acute anal fissure: Secondary | ICD-10-CM | POA: Insufficient documentation

## 2018-07-17 DIAGNOSIS — Z8507 Personal history of malignant neoplasm of pancreas: Secondary | ICD-10-CM | POA: Insufficient documentation

## 2018-07-17 DIAGNOSIS — Y92009 Unspecified place in unspecified non-institutional (private) residence as the place of occurrence of the external cause: Secondary | ICD-10-CM | POA: Diagnosis not present

## 2018-07-17 DIAGNOSIS — W01198A Fall on same level from slipping, tripping and stumbling with subsequent striking against other object, initial encounter: Secondary | ICD-10-CM | POA: Diagnosis not present

## 2018-07-17 DIAGNOSIS — N5089 Other specified disorders of the male genital organs: Secondary | ICD-10-CM | POA: Diagnosis not present

## 2018-07-17 DIAGNOSIS — K5900 Constipation, unspecified: Secondary | ICD-10-CM | POA: Diagnosis not present

## 2018-07-17 DIAGNOSIS — S0990XA Unspecified injury of head, initial encounter: Secondary | ICD-10-CM | POA: Insufficient documentation

## 2018-07-17 DIAGNOSIS — Z79899 Other long term (current) drug therapy: Secondary | ICD-10-CM | POA: Diagnosis not present

## 2018-07-17 DIAGNOSIS — Z85118 Personal history of other malignant neoplasm of bronchus and lung: Secondary | ICD-10-CM | POA: Diagnosis not present

## 2018-07-17 DIAGNOSIS — Z7982 Long term (current) use of aspirin: Secondary | ICD-10-CM | POA: Insufficient documentation

## 2018-07-17 DIAGNOSIS — Z9049 Acquired absence of other specified parts of digestive tract: Secondary | ICD-10-CM | POA: Diagnosis not present

## 2018-07-17 DIAGNOSIS — N433 Hydrocele, unspecified: Secondary | ICD-10-CM | POA: Diagnosis not present

## 2018-07-17 LAB — COMPREHENSIVE METABOLIC PANEL
ALK PHOS: 75 U/L (ref 38–126)
ALT: 30 U/L (ref 0–44)
AST: 43 U/L — AB (ref 15–41)
Albumin: 3.2 g/dL — ABNORMAL LOW (ref 3.5–5.0)
Anion gap: 12 (ref 5–15)
BUN: 18 mg/dL (ref 8–23)
CHLORIDE: 92 mmol/L — AB (ref 98–111)
CO2: 23 mmol/L (ref 22–32)
CREATININE: 0.72 mg/dL (ref 0.61–1.24)
Calcium: 9.1 mg/dL (ref 8.9–10.3)
GFR calc Af Amer: >60 mL/min (ref >60)
GFR calc non Af Amer: >60 mL/min (ref >60)
Glucose, Bld: 110 mg/dL — ABNORMAL HIGH (ref 70–99)
Potassium: 3 mmol/L — ABNORMAL LOW (ref 3.5–5.1)
SODIUM: 127 mmol/L — AB (ref 135–145)
Total Bilirubin: 0.5 mg/dL (ref 0.3–1.2)
Total Protein: 6.3 g/dL — ABNORMAL LOW (ref 6.5–8.1)

## 2018-07-17 LAB — URINALYSIS, ROUTINE W REFLEX MICROSCOPIC
Bilirubin Urine: NEGATIVE
Glucose, UA: NEGATIVE mg/dL
HGB URINE DIPSTICK: NEGATIVE
Ketones, ur: NEGATIVE mg/dL
Leukocytes, UA: NEGATIVE
Nitrite: NEGATIVE
Protein, ur: NEGATIVE mg/dL
SPECIFIC GRAVITY, URINE: 1.004 — AB (ref 1.005–1.030)
pH: 6 (ref 5.0–8.0)

## 2018-07-17 LAB — CBC WITH DIFFERENTIAL/PLATELET
Abs Immature Granulocytes: 1.12 10*3/uL — ABNORMAL HIGH (ref 0.00–0.07)
Basophils Absolute: 0.1 10*3/uL (ref 0.0–0.1)
Basophils Relative: 0 %
EOS PCT: 2 %
Eosinophils Absolute: 0.4 10*3/uL (ref 0.0–0.5)
HEMATOCRIT: 30.6 % — AB (ref 39.0–52.0)
HEMOGLOBIN: 10.1 g/dL — AB (ref 13.0–17.0)
Immature Granulocytes: 5 %
LYMPHS ABS: 1.4 10*3/uL (ref 0.7–4.0)
Lymphocytes Relative: 7 %
MCH: 31.3 pg (ref 26.0–34.0)
MCHC: 33 g/dL (ref 30.0–36.0)
MCV: 94.7 fL (ref 80.0–100.0)
MONO ABS: 2.3 10*3/uL — AB (ref 0.1–1.0)
MONOS PCT: 11 %
Neutro Abs: 16.4 10*3/uL — ABNORMAL HIGH (ref 1.7–7.7)
Neutrophils Relative %: 75 %
Platelets: 533 10*3/uL — ABNORMAL HIGH (ref 150–400)
RBC: 3.23 MIL/uL — ABNORMAL LOW (ref 4.22–5.81)
RDW: 13.1 % (ref 11.5–15.5)
WBC: 21.7 10*3/uL — ABNORMAL HIGH (ref 4.0–10.5)
nRBC: 0 % (ref 0.0–0.2)

## 2018-07-17 LAB — LIPASE, BLOOD: Lipase: 21 U/L (ref 11–51)

## 2018-07-17 LAB — BRAIN NATRIURETIC PEPTIDE: B NATRIURETIC PEPTIDE 5: 268.7 pg/mL — AB (ref 0.0–100.0)

## 2018-07-17 MED ORDER — LIDOCAINE (ANORECTAL) 5 % EX CREA: TOPICAL_CREAM | CUTANEOUS | 0 refills | Status: DC

## 2018-07-17 MED ORDER — POTASSIUM CHLORIDE CRYS ER 20 MEQ PO TBCR
40.0000 meq | EXTENDED_RELEASE_TABLET | Freq: Once | ORAL | Status: AC
  Administered 2018-07-17: 40 meq via ORAL
  Filled 2018-07-17: qty 2

## 2018-07-17 NOTE — ED Triage Notes (Signed)
Pt reports that he was constipated and started on Saturday he took laxatives and been having painful stools that are watery. Pt reports that he has been having swelling in bilat lower legs for year, but over the past couple days noticed scrotum is swollen. Pt c/o pain in lower back and rectum. Pt reports that he was coming back from bathroom yesterday and fell on his back. Reports that hit ",y head a little". Denies LOC or taking blood thinners. C/o pain on whole right side.

## 2018-07-17 NOTE — Discharge Instructions (Signed)
Please follow up with your primary care provider this week.   Your ultrasound showed hydrocele. Please follow up with urology. See attached handout.   Please see the handout on anal fissures.  I would like you to do sitz bath 3 times a day and also after each bowel movement. Use lidocaine cream for relief of pain as directed.   Please see attached handout on lower extremity edema.  Your lower extremity ultrasounds were negative for DVT.  Your chest x-ray was reassuring.  Your renal function was reassuring.  If you develop worsening or new concerning symptoms you can return to the emergency department for re-evaluation.

## 2018-07-17 NOTE — ED Provider Notes (Signed)
[facility] DEPT Provider Note   CSN: [id number] Arrival date & time: 07/17/18  7017     History   Chief Complaint Chief Complaint  Patient presents with  . Constipation  . Groin Swelling  . Fall    HPI Sean Cooke is a 84 y.o. male with complex medical history as below who presents emergency department today for several complaints.  Patient and wife help provide history.  They report that they are in between PCPs so they came here today.  She reports that the patient has had lower extremity swelling bilaterally for the last several months.  She reports that this has been progressing for the last 2 weeks and he now has scrotal swelling over the last few days.  Patient denies any pain associated with this.  He denies changes in urination.  No hematuria or dysuria.  Patient denies any shortness of breath.  No history of CHF.  No cough or hemoptysis.  No trauma.  He denies any testicular pain, penile pain, penile discharge.  Patient also reports he had a mechanical fall yesterday while going to the bathroom.  He notes that he tripped over something and fell hit his head.  No loss of consciousness.  Patient was able to ambulate immediately after the event without difficulty.  He denies any current symptoms from the fall.  No back pain.  No extremity pain.  He denies any preceding symptoms prior to the fall.  Patient also reports that he has been constipated for the last several days.  He notes on Sunday he gone 2 days without a bowel movement so he took a large amount of Dulcolax sitting laxative.  He notes that afterwards he started having watery diarrhea every 30 minutes.  He reports this is since resolved however patient has continued pain with bowel movements.  No blood or melena stool.  He denies any abdominal pain.  No fevers.  No recent antibiotic use.  He notes pain occurs immediately during bowel movement and resolves several minutes later.  He  describes as a burning/tearing-like pain of his anus.  HPI  Past Medical History:  Diagnosis Date  . Anemia   . Arthritis   . Benign localized prostatic hyperplasia with lower urinary tract symptoms (LUTS)   . Bilateral edema of lower extremity   . Borderline diabetes   . Chronic low back pain   . DDD (degenerative disc disease), lumbar   . Degenerative joint disease   . Diverticulosis   . Duodenal diverticulum   . Encounter for antineoplastic chemotherapy 07/2015   lung cancer--- per pt completed chemo 06/ 2018  . Fatigue   . Generalized weakness   . GERD (gastroesophageal reflux disease)   . Hemorrhoids   . Herniated intervertebral disc of lumbar spine    L4-5  . Hiatal hernia   . History of adenomatous polyp of colon   . Hypertension   . Lumbar stenosis    L4-5  . 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 Pancreatic cancer [facility]) oncologist-  dr Julien Nordmann   new dx 09/ 2019  . Poor dental hygiene   . Wears hearing aid in both ears     Patient Active Problem List   Diagnosis Date Noted  . Coagulopathy ([place]) 06/21/2018  . Preoperative clearance  05/22/2018  . Primary adenocarcinoma of body of pancreas ([place]) 04/18/2018  . Diarrhea 10/29/2015  . Encounter for antineoplastic chemotherapy 07/31/2015  . Malignant neoplasm of right upper lobe of lung ([place]) 07/15/2015  . Hyponatremia 10/07/2014  . Community acquired bacterial pneumonia 10/04/2014  . Leukocytosis 10/04/2014  . Essential hypertension 10/04/2014  . BPH (benign prostatic hyperplasia) 10/04/2014  . Abdominal pain, LLQ (left lower quadrant) 04/03/2014  . Constipation 08/20/2013  . Lactose intolerance 08/20/2013  . GERD 04/27/2008  . THROMBOCYTHEMIA 04/23/2008  . Normocytic anemia 04/23/2008  . SPONDYLOSIS, LUMBAR 04/23/2008  .  NEURITIS, LUMBOSACRAL 04/23/2008  . OSTEOPENIA 04/23/2008  . ASPLENIA 04/23/2008  . URINARY FREQUENCY 04/23/2008  . COLONIC POLYPS, ADENOMATOUS, HX OF 04/23/2008    Past Surgical History:  Procedure Laterality Date  . CATARACT EXTRACTION W/ INTRAOCULAR LENS  IMPLANT, BILATERAL  2004  . ESOPHAGOGASTRODUODENOSCOPY (EGD) WITH PROPOFOL N/A 07/08/2018   Procedure: ESOPHAGOGASTRODUODENOSCOPY (EGD) WITH PROPOFOL;  Surgeon: Rush Landmark Telford Nab., MD;  Location: [facility];  Service: Gastroenterology;  Laterality: N/A;  . EUS N/A 05/16/2018   Procedure: UPPER ENDOSCOPIC ULTRASOUND (EUS) RADIAL;  Surgeon: Milus Banister, MD;  Location: [facility];  Service: Endoscopy;  Laterality: N/A;  . EUS N/A 07/08/2018   Procedure: UPPER ENDOSCOPIC ULTRASOUND (EUS) RADIAL;  Surgeon: Rush Landmark Telford Nab., MD;  Location: [facility];  Service: Gastroenterology;  Laterality: N/A;  . FINE NEEDLE ASPIRATION N/A 05/16/2018   Procedure: FINE NEEDLE ASPIRATION (FNA) LINEAR;  Surgeon: Milus Banister, MD;  Location: [facility];  Service: Endoscopy;  Laterality: N/A;  . INGUINAL HERNIA REPAIR  1980s   unilateral , pt unsure which side  . LAPAROSCOPIC CHOLECYSTECTOMY  07-02-2003   dr gerkin @[facility]   . SPLENECTOMY  10-11-2000   @[facility]    via exploratory laparotomy for blunt abdominal trauma  . TRANSURETHRAL RESECTION OF PROSTATE  1988        Home Medications    Prior to Admission medications   Medication Sig Start Date End Date Taking? Authorizing Provider  amLODipine (NORVASC) 5 MG tablet Take 5 mg by mouth every evening.     [provider]  antiseptic oral rinse (BIOTENE) LIQD 15 mLs by Mouth Rinse route 5 (five) times daily as needed for dry mouth.     [provider]  aspirin 81 MG tablet Take 81 mg by mouth at bedtime.     [provider]  betamethasone dipropionate (DIPROLENE) 0.05 % cream Apply 1 application topically daily as needed (irritation).     [provider]  Calcium Carb-Cholecalciferol (CALCIUM-VITAMIN D) 500-200 MG-UNIT tablet Take 1 tablet by mouth 2 (two) times daily. Morning & after supper    [provider]  cromolyn (OPTICROM) 4 % ophthalmic solution Place 1 drop into both eyes 4 (four) times daily. Morning, noon, 1800, & bedtime. 07/25/16   [provider]  denosumab (PROLIA) 60 MG/ML SOLN injection Inject 60 mg into the skin every 6 (six) months. Administer in upper arm, thigh, or abdomen    [provider]  famotidine (PEPCID) 20 MG tablet Take 20 mg by mouth 2 (two) times daily.    [provider]  ferrous sulfate 325 (65 FE) MG tablet Take 325 mg by mouth 2 (two) times daily with a meal. Morning & after supper    [provider]  fluconazole (DIFLUCAN) 200 MG tablet Take 1 tablet (200 mg total) by mouth daily for 1 day, THEN 0.5 tablets (100 mg total) daily for 14 days. 07/10/18 07/25/18  Mansouraty, Telford Nab., MD  guaiFENesin (MUCINEX) 600 MG 12 hr tablet Take 600 mg by mouth 2 (two) times daily.     [provider]  HYDROmorphone (DILAUDID) 2 MG tablet Take 1 tablet by mouth every 8 (eight) hours as needed.    [provider]  ibuprofen (ADVIL,MOTRIN) 800 MG tablet Take 800 mg by mouth 3 (three) times daily after meals.     [provider]  lidocaine-prilocaine (EMLA) cream Apply 1 application topically as needed. 09/27/15   Curt Bears, MD  [facility] ARTHRITIS PAIN RELIEF EX) Apply 1 patch topically daily as needed (pain).     [provider]  loperamide (IMODIUM) 2 MG capsule Take 2 mg by mouth as needed for diarrhea or loose stools. Reported on 03/01/2016    [provider]  loratadine (CLARITIN) 10 MG tablet Take 10 mg by mouth every morning.     [provider]  losartan (COZAAR) 100 MG tablet Take 100 mg by mouth every morning.  03/29/16   [provider]  Multiple Vitamin (MULTIVITAMIN) tablet Take  1 tablet by mouth daily.    [provider]  Omega-3 Fatty Acids (FISH OIL) 1000 MG CAPS Take 1,000 mg by mouth daily.     [provider]  oxybutynin (DITROPAN) 5 MG tablet Take 5 mg by mouth at bedtime.     [provider]  pantoprazole (PROTONIX) 40 MG tablet Take 40 mg by mouth every morning.     [provider]  polyethylene glycol (MIRALAX / GLYCOLAX) packet Take 17 g by mouth daily as needed (constipation.).     [provider]  Probiotic Product (ALIGN) 4 MG CAPS Take 4 mg by mouth daily.     [provider]  PROCTOSOL HC 2.5 % rectal cream Place 1 application rectally 2 (two) times daily as needed (hemorrhoids).  01/12/16   [provider]  saw palmetto 160 MG capsule Take 160 mg by mouth daily.    [provider]  simethicone (MYLICON) 80 MG chewable tablet Chew 80 mg by mouth every 6 (six) hours as needed for flatulence.    [provider]  Tamsulosin HCl (FLOMAX) 0.4 MG CAPS Take 0.4 mg by mouth every morning.     [provider]  triamcinolone cream (KENALOG) 0.1 % Apply 1 application topically daily as needed (irritation).    [provider]  Turmeric 500 MG TABS Take 500 mg by mouth daily after supper.     [provider]    Family History Family History  Problem Relation Age of Onset  . Diabetes Father   . Colon cancer Neg Hx     Social History Social History   Tobacco Use  . Smoking status: Former Smoker    Years: 30.00    Types: Cigarettes, Pipe    Last attempt to quit: 08/14/1988    Years since quitting: 29.9  . Smokeless tobacco: Never Used  . Tobacco comment: 06-13-2018 per pt quit cigarettes in 1960;  quit pipe 1990  Substance Use Topics  . Alcohol use: Not Currently    Alcohol/week: 0.0 standard drinks  . Drug use: Never     Allergies   Benzonatate; Lyrica [pregabalin]; Gabapentin; Tizanidine; Penicillins; and Tramadol   Review of Systems Review  of Systems  All other systems reviewed and are negative.    Physical Exam Updated Vital Signs BP 136/70 (BP Location: Right Arm)   Pulse 66   Temp 97.8 F (36.6  C) (Oral)   Resp 17   SpO2 95%   Physical Exam  Constitutional: He appears well-developed and well-nourished.  HENT:  Head: Normocephalic and atraumatic.  Right Ear: External ear normal.  Left Ear: External ear normal.  Nose: Nose normal.  Mouth/Throat: Uvula is midline, oropharynx is clear and moist and mucous membranes are normal. No tonsillar exudate.  Eyes: Pupils are equal, round, and reactive to light. Right eye exhibits no discharge. Left eye exhibits no discharge. No scleral icterus.  Neck: Trachea normal. Neck supple. No spinous process tenderness present. No neck rigidity. Normal range of motion present.  No nuchal rigidity or meningismus. NO c-spine ttp or step offs.   Cardiovascular: Normal rate, regular rhythm and intact distal pulses.  No murmur heard. Pulses:      Radial pulses are 2+ on the right side, and 2+ on the left side.       Dorsalis pedis pulses are 2+ on the right side, and 2+ on the left side.       Posterior tibial pulses are 2+ on the right side, and 2+ on the left side.  Nonpitting lower extremity edema bilaterally  Pulmonary/Chest: Effort normal and breath sounds normal. He exhibits no tenderness.  Abdominal: Soft. Bowel sounds are normal. There is no tenderness. There is no rigidity, no rebound, no guarding and no CVA tenderness.  Genitourinary: Penis normal. Circumcised.  Genitourinary Comments: Chaperone present -patient with scrotal swelling bilaterally.  Testes are without tenderness nor enlargement. Rectal exam without any gross blood on exam.  Patient does have external hemorrhoids.  No thrombosis or bleeding.  No evidence of perirectal or perianal abscesses.  Patient does have noted anal fissure.  Musculoskeletal: He exhibits no edema.  Lymphadenopathy:    He has no cervical  adenopathy.  Neurological: He is alert.  Speech clear. Follows commands. No facial droop. PERRLA. EOMI. Normal peripheral fields. CN III-XII intact.  Grossly moves all extremities 4 without ataxia. Coordination intact. Able and appropriate strength for age to upper and lower extremities bilaterally including grip strength & plantar flexion/dorsiflexion. Sensation to light touch intact bilaterally for upper and lower. Patellar deep tendon reflex 2+ and equal bilaterally. Normal finger to nose. No pronator drift.  Skin: Skin is warm and dry. No rash noted. He is not diaphoretic.  Psychiatric: He has a normal mood and affect.  Nursing note and vitals reviewed.    ED Treatments / Results  Labs (all labs ordered are listed, but only abnormal results are displayed) Labs Reviewed  COMPREHENSIVE METABOLIC PANEL - Abnormal; Notable for the following components:      Result Value   Sodium 127 (*)    Potassium 3.0 (*)    Chloride 92 (*)    Glucose, Bld 110 (*)    Total Protein 6.3 (*)    Albumin 3.2 (*)    AST 43 (*)    All other components within normal limits  CBC WITH DIFFERENTIAL/PLATELET - Abnormal; Notable for the following components:   WBC 21.7 (*)    RBC 3.23 (*)    Hemoglobin 10.1 (*)    HCT 30.6 (*)    Platelets 533 (*)    Neutro Abs 16.4 (*)    Monocytes Absolute 2.3 (*)    Abs Immature Granulocytes 1.12 (*)    All other components within normal limits  BRAIN NATRIURETIC PEPTIDE - Abnormal; Notable for the following components:   B Natriuretic Peptide 268.7 (*)    All other components within normal limits  LIPASE, BLOOD  URINALYSIS, ROUTINE W REFLEX MICROSCOPIC    EKG None  Radiology Dg Chest 2 View  Result Date: 07/17/2018 CLINICAL DATA:  Constipation.  Painful stools.  Scrotal swelling. EXAM: CHEST - 2 VIEW COMPARISON:  CT of the chest April 16, 2018 FINDINGS: Air-filled bowel is under the right hemidiaphragm. No convincing evidence of free air. The right  hemidiaphragm is elevated. The heart, hila, mediastinum are normal. No pneumothorax. No nodules or masses. A right Port-A-Cath is in good position. IMPRESSION: No active cardiopulmonary disease. Electronically Signed   By: Dorise Bullion III M.D   On: 07/17/2018 14:09   Ct Head Wo Contrast  Result Date: 07/17/2018 CLINICAL DATA:  Constipation, painful stools, lower extremity swelling for years, fall yesterday, minor head trauma. EXAM: CT HEAD WITHOUT CONTRAST TECHNIQUE: Contiguous axial images were obtained from the base of the skull through the vertex without intravenous contrast. COMPARISON:  Head CT dated 11/07/2017 FINDINGS: Brain: Ventricles are stable in size and configuration. Mild generalized age related parenchymal volume loss with commensurate dilatation of the ventricles and sulci. Mild chronic small vessel ischemic changes within the periventricular white matter regions bilaterally. No mass, hemorrhage, edema or other evidence of acute parenchymal abnormality. No extra-axial hemorrhage. Vascular: Chronic calcified atherosclerotic changes of the large vessels at the skull base. No unexpected hyperdense vessel. Skull: Normal. Negative for fracture or focal lesion. Sinuses/Orbits: No acute finding. Other: None. IMPRESSION: 1. No acute findings. No intracranial mass, hemorrhage or edema. No skull fracture. 2. Mild chronic small vessel ischemic changes within the white matter. Electronically Signed   By: Franki Cabot M.D.   On: 07/17/2018 14:59   US Scrotum W/doppler  Result Date: 07/17/2018 CLINICAL DATA:  Swelling EXAM: SCROTAL ULTRASOUND DOPPLER ULTRASOUND OF THE TESTICLES TECHNIQUE: Complete ultrasound examination of the testicles, epididymis, and other scrotal structures was performed. Color and spectral Doppler ultrasound were also utilized to evaluate blood flow to the testicles. COMPARISON:  None. FINDINGS: Right testicle Measurements: 2.9 x 2.3 x 2.5 cm. No mass or microlithiasis  visualized. Left testicle Measurements: 3 x 2.7 x 2.2 cm. No mass or microlithiasis visualized. Right epididymis:  Normal in size and appearance. Left epididymis:  Normal in size and appearance. Hydrocele:  Large hydroceles bilaterally. Varicocele:  None visualized. Pulsed Doppler interrogation of both testes demonstrates normal low resistance arterial and venous waveforms bilaterally. At least mild thickening of the bilateral scrotal walls, presumably due to soft tissue edema. No circumscribed mass or fluid collection within these superficial soft tissues. IMPRESSION: 1. Both testicles appear normal. No evidence of testicular mass. No evidence of testicular torsion or orchitis. 2. Large bilateral hydroceles. 3. Diffuse scrotal wall thickening, presumably some degree of soft tissue edema. Electronically Signed   By: Franki Cabot M.D.   On: 07/17/2018 14:57   Vas [place] Lower Extremity Venous (dvt) (mc And Wl 7a-7p)  Result Date: 07/17/2018  Lower Venous Study Indications: Swelling.  Performing Technologist: Oliver Hum RVT  Examination Guidelines: A complete evaluation includes B-mode imaging, spectral Doppler, color Doppler, and power Doppler as needed of all accessible portions of each vessel. Bilateral testing is considered an integral part of a complete examination. Limited examinations for reoccurring indications may be performed as noted.  Right Venous Findings: +---------+---------------+---------+-----------+----------+-------+          CompressibilityPhasicitySpontaneityPropertiesSummary +---------+---------------+---------+-----------+----------+-------+ CFV      Full           Yes      Yes                          +---------+---------------+---------+-----------+----------+-------+  SFJ      Full                                                 +---------+---------------+---------+-----------+----------+-------+ FV Prox  Full                                                  +---------+---------------+---------+-----------+----------+-------+ FV Mid   Full                                                 +---------+---------------+---------+-----------+----------+-------+ FV DistalFull                                                 +---------+---------------+---------+-----------+----------+-------+ PFV      Full                                                 +---------+---------------+---------+-----------+----------+-------+ POP      Full           Yes      Yes                          +---------+---------------+---------+-----------+----------+-------+ PTV      Full                                                 +---------+---------------+---------+-----------+----------+-------+ PERO     Full                                                 +---------+---------------+---------+-----------+----------+-------+  Left Venous Findings: +---------+---------------+---------+-----------+----------+-------+          CompressibilityPhasicitySpontaneityPropertiesSummary +---------+---------------+---------+-----------+----------+-------+ CFV      Full           Yes      Yes                          +---------+---------------+---------+-----------+----------+-------+ SFJ      Full                                                 +---------+---------------+---------+-----------+----------+-------+ FV Prox  Full                                                 +---------+---------------+---------+-----------+----------+-------+  FV Mid   Full                                                 +---------+---------------+---------+-----------+----------+-------+ FV DistalFull                                                 +---------+---------------+---------+-----------+----------+-------+ PFV      Full                                                  +---------+---------------+---------+-----------+----------+-------+ POP      Full           Yes      Yes                          +---------+---------------+---------+-----------+----------+-------+ PTV      Full                                                 +---------+---------------+---------+-----------+----------+-------+ PERO     Full                                                 +---------+---------------+---------+-----------+----------+-------+    Summary: Right: There is no evidence of deep vein thrombosis in the lower extremity. No cystic structure found in the popliteal fossa. Left: There is no evidence of deep vein thrombosis in the lower extremity. No cystic structure found in the popliteal fossa.  *See table(s) above for measurements and observations.    Preliminary     Procedures Procedures (including critical care time)  Medications Ordered in ED Medications  potassium chloride SA (K-DUR,KLOR-CON) CR tablet 40 mEq (has no administration in time range)     Initial Impression / Assessment and Plan / ED Course  I have reviewed the triage vital signs and the nursing notes.  Pertinent labs & imaging results that were available during my care of the patient were reviewed by me and considered in my medical decision making (see chart for details).     84 y.o. male with complex medical history as below who presents emergency department today for several complaints.  Patient and wife help provide history.  They report that they are in between PCPs so they came here today.  She reports that the patient has had lower extremity swelling bilaterally for the last several months.  She reports that this has been progressing for the last 2 weeks and he now has scrotal swelling over the last few days.  Patient denies any pain associated with this.  He denies changes in urination.  No hematuria or dysuria.  Patient denies any shortness of breath.  No history of CHF.  No  cough or hemoptysis.  No trauma.  He denies any testicular pain, penile pain, penile discharge.  Patient vital signs are reassuring on presentation.  On exam patient does have noted scrotal swelling.  There is no evidence of mass on exam.  No testicular pain or tenderness.  Ultrasound is without evidence of testicular torsion or orchitis.  No evidence of epididymitis.  There is noted bilateral hydroceles.  Patient is chest x-ray is without evidence of pulmonary edema other active cardia pulmonary disease.  BNP less than 500. No significant renal insufficieny.  The lower extremity ultrasounds are negative for DVT.  Will give referral to urology and have patient follow with PCP as well.  Patient also reports he had a mechanical fall yesterday while going to the bathroom.  He notes that he tripped over something and fell hit his head.  No loss of consciousness.  Patient was able to ambulate immediately after the event without difficulty.  He denies any current symptoms from the fall.  No back pain.  No extremity pain.  He denies any preceding symptoms prior to the fall.  Patient with normal neurologic exam.  He is on any blood thinners.  No C-spine tenderness palpation or step-offs.  Normal range of motion of all extremities.  No thoracic or lumbar spinous tenderness palpation.  CT of the head without any evidence of skull fracture intraconal hemorrhage.  Patient also reports that he has been constipated for the last several days.  He notes on "Sunday he gone 2 days without a bowel movement so he took a large amount of Dulcolax sitting laxative.  He notes that afterwards he started having watery diarrhea every 30 minutes.  He reports this is since resolved however patient has continued pain with bowel movements.  No blood or melena stool.  He denies any abdominal pain.  No fevers.  No recent antibiotic use.  He notes pain occurs immediately during bowel movement and resolves several minutes later.  He describes as a  burning/tearing-like pain of his anus.  Patient's vital signs are reassuring on presentation.  Patient's abdomen exam is benign without any tenderness.  Rectal exam reveals noted fissure. Will tx with lidocaine anorectal cream, sitz baths and advice stool softeners to avoid constipation.   Labs reviewed and appear similar to prior. The evaluation does not show pathology that would require ongoing emergent intervention or inpatient treatment. I advised the patient to follow-up with PCP this week. I advised the patient to return to the emergency department with new or worsening symptoms or new concerns. Specific return precautions discussed. The patient verbalized understanding and agreement with plan. All questions answered. No further questions at this time. The patient is hemodynamically stable, mentating appropriately and appears safe for discharge.  Patient case seen and discussed with Dr. Zackowski who is in agreement with plan.   Final Clinical Impressions(s) / ED Diagnoses   Final diagnoses:  Fissure, anal  Fall, initial encounter  Lower extremity edema  Scrotal swelling    ED Discharge Orders         Ordered    Lidocaine, Anorectal, 5 % CREA     12" /04/19 1519           Lorelle Gibbs 07/17/18 1524    Fredia Sorrow, MD 07/25/18 [phone]

## 2018-07-17 NOTE — Progress Notes (Signed)
Bilateral lower extremity venous duplex has been completed. Negative for DVT. Results were given to [employer] PA.  07/17/18 1:50 PM Carlos Levering RVT

## 2018-07-18 ENCOUNTER — Ambulatory Visit: Payer: PPO

## 2018-07-18 ENCOUNTER — Ambulatory Visit: Payer: PPO | Admitting: Radiation Oncology

## 2018-07-18 DIAGNOSIS — N5089 Other specified disorders of the male genital organs: Secondary | ICD-10-CM | POA: Diagnosis not present

## 2018-07-18 DIAGNOSIS — N433 Hydrocele, unspecified: Secondary | ICD-10-CM | POA: Diagnosis not present

## 2018-07-18 DIAGNOSIS — Z510 Encounter for antineoplastic radiation therapy: Secondary | ICD-10-CM | POA: Insufficient documentation

## 2018-07-18 DIAGNOSIS — C251 Malignant neoplasm of body of pancreas: Secondary | ICD-10-CM | POA: Insufficient documentation

## 2018-07-21 ENCOUNTER — Other Ambulatory Visit: Payer: Self-pay

## 2018-07-21 ENCOUNTER — Encounter (HOSPITAL_COMMUNITY): Payer: Self-pay | Admitting: Emergency Medicine

## 2018-07-21 ENCOUNTER — Inpatient Hospital Stay (HOSPITAL_COMMUNITY)
Admission: EM | Admit: 2018-07-21 | Discharge: 2018-08-02 | DRG: 871 | Disposition: A | Discharge status: Skilled Nursing Facility | Payer: PPO | Attending: Family Medicine | Admitting: Family Medicine

## 2018-07-21 DIAGNOSIS — D72829 Elevated white blood cell count, unspecified: Secondary | ICD-10-CM | POA: Diagnosis present

## 2018-07-21 DIAGNOSIS — Z85118 Personal history of other malignant neoplasm of bronchus and lung: Secondary | ICD-10-CM | POA: Diagnosis not present

## 2018-07-21 DIAGNOSIS — M7989 Other specified soft tissue disorders: Secondary | ICD-10-CM | POA: Diagnosis present

## 2018-07-21 DIAGNOSIS — E871 Hypo-osmolality and hyponatremia: Secondary | ICD-10-CM | POA: Diagnosis not present

## 2018-07-21 DIAGNOSIS — Z7401 Bed confinement status: Secondary | ICD-10-CM | POA: Diagnosis not present

## 2018-07-21 DIAGNOSIS — Z515 Encounter for palliative care: Secondary | ICD-10-CM | POA: Diagnosis not present

## 2018-07-21 DIAGNOSIS — G8929 Other chronic pain: Secondary | ICD-10-CM | POA: Diagnosis present

## 2018-07-21 DIAGNOSIS — N5089 Other specified disorders of the male genital organs: Secondary | ICD-10-CM | POA: Diagnosis present

## 2018-07-21 DIAGNOSIS — E46 Unspecified protein-calorie malnutrition: Secondary | ICD-10-CM | POA: Diagnosis present

## 2018-07-21 DIAGNOSIS — Z79899 Other long term (current) drug therapy: Secondary | ICD-10-CM

## 2018-07-21 DIAGNOSIS — D509 Iron deficiency anemia, unspecified: Secondary | ICD-10-CM | POA: Diagnosis not present

## 2018-07-21 DIAGNOSIS — R338 Other retention of urine: Secondary | ICD-10-CM | POA: Diagnosis not present

## 2018-07-21 DIAGNOSIS — Z9081 Acquired absence of spleen: Secondary | ICD-10-CM

## 2018-07-21 DIAGNOSIS — E440 Moderate protein-calorie malnutrition: Secondary | ICD-10-CM | POA: Diagnosis not present

## 2018-07-21 DIAGNOSIS — M47817 Spondylosis without myelopathy or radiculopathy, lumbosacral region: Secondary | ICD-10-CM | POA: Diagnosis not present

## 2018-07-21 DIAGNOSIS — K5900 Constipation, unspecified: Secondary | ICD-10-CM | POA: Diagnosis not present

## 2018-07-21 DIAGNOSIS — R52 Pain, unspecified: Secondary | ICD-10-CM | POA: Diagnosis not present

## 2018-07-21 DIAGNOSIS — E8770 Fluid overload, unspecified: Secondary | ICD-10-CM | POA: Diagnosis not present

## 2018-07-21 DIAGNOSIS — C251 Malignant neoplasm of body of pancreas: Secondary | ICD-10-CM | POA: Diagnosis present

## 2018-07-21 DIAGNOSIS — M47816 Spondylosis without myelopathy or radiculopathy, lumbar region: Secondary | ICD-10-CM | POA: Diagnosis not present

## 2018-07-21 DIAGNOSIS — L03818 Cellulitis of other sites: Secondary | ICD-10-CM | POA: Diagnosis not present

## 2018-07-21 DIAGNOSIS — R5381 Other malaise: Secondary | ICD-10-CM | POA: Diagnosis not present

## 2018-07-21 DIAGNOSIS — Z7189 Other specified counseling: Secondary | ICD-10-CM | POA: Diagnosis not present

## 2018-07-21 DIAGNOSIS — Z974 Presence of external hearing-aid: Secondary | ICD-10-CM

## 2018-07-21 DIAGNOSIS — A419 Sepsis, unspecified organism: Secondary | ICD-10-CM | POA: Diagnosis not present

## 2018-07-21 DIAGNOSIS — I5033 Acute on chronic diastolic (congestive) heart failure: Secondary | ICD-10-CM | POA: Diagnosis present

## 2018-07-21 DIAGNOSIS — I10 Essential (primary) hypertension: Secondary | ICD-10-CM | POA: Diagnosis not present

## 2018-07-21 DIAGNOSIS — R1909 Other intra-abdominal and pelvic swelling, mass and lump: Secondary | ICD-10-CM | POA: Diagnosis present

## 2018-07-21 DIAGNOSIS — N401 Enlarged prostate with lower urinary tract symptoms: Secondary | ICD-10-CM | POA: Diagnosis not present

## 2018-07-21 DIAGNOSIS — K5909 Other constipation: Secondary | ICD-10-CM | POA: Diagnosis not present

## 2018-07-21 DIAGNOSIS — M2550 Pain in unspecified joint: Secondary | ICD-10-CM | POA: Diagnosis not present

## 2018-07-21 DIAGNOSIS — I509 Heart failure, unspecified: Secondary | ICD-10-CM | POA: Diagnosis not present

## 2018-07-21 DIAGNOSIS — M47896 Other spondylosis, lumbar region: Secondary | ICD-10-CM | POA: Diagnosis not present

## 2018-07-21 DIAGNOSIS — N492 Inflammatory disorders of scrotum: Secondary | ICD-10-CM

## 2018-07-21 DIAGNOSIS — N433 Hydrocele, unspecified: Secondary | ICD-10-CM | POA: Diagnosis not present

## 2018-07-21 DIAGNOSIS — R102 Pelvic and perineal pain: Secondary | ICD-10-CM | POA: Diagnosis present

## 2018-07-21 DIAGNOSIS — Z9221 Personal history of antineoplastic chemotherapy: Secondary | ICD-10-CM

## 2018-07-21 DIAGNOSIS — R682 Dry mouth, unspecified: Secondary | ICD-10-CM | POA: Diagnosis not present

## 2018-07-21 DIAGNOSIS — Z111 Encounter for screening for respiratory tuberculosis: Secondary | ICD-10-CM | POA: Diagnosis not present

## 2018-07-21 DIAGNOSIS — K219 Gastro-esophageal reflux disease without esophagitis: Secondary | ICD-10-CM | POA: Diagnosis present

## 2018-07-21 DIAGNOSIS — Z885 Allergy status to narcotic agent status: Secondary | ICD-10-CM

## 2018-07-21 DIAGNOSIS — L0390 Cellulitis, unspecified: Secondary | ICD-10-CM | POA: Diagnosis present

## 2018-07-21 DIAGNOSIS — N400 Enlarged prostate without lower urinary tract symptoms: Secondary | ICD-10-CM | POA: Diagnosis present

## 2018-07-21 DIAGNOSIS — I5032 Chronic diastolic (congestive) heart failure: Secondary | ICD-10-CM | POA: Diagnosis not present

## 2018-07-21 DIAGNOSIS — Z87891 Personal history of nicotine dependence: Secondary | ICD-10-CM

## 2018-07-21 DIAGNOSIS — R262 Difficulty in walking, not elsewhere classified: Secondary | ICD-10-CM | POA: Diagnosis not present

## 2018-07-21 DIAGNOSIS — Z880 Allergy status to penicillin: Secondary | ICD-10-CM

## 2018-07-21 DIAGNOSIS — D649 Anemia, unspecified: Secondary | ICD-10-CM | POA: Diagnosis not present

## 2018-07-21 DIAGNOSIS — I110 Hypertensive heart disease with heart failure: Secondary | ICD-10-CM | POA: Diagnosis present

## 2018-07-21 DIAGNOSIS — M6281 Muscle weakness (generalized): Secondary | ICD-10-CM | POA: Diagnosis not present

## 2018-07-21 DIAGNOSIS — Z7982 Long term (current) use of aspirin: Secondary | ICD-10-CM

## 2018-07-21 DIAGNOSIS — Z888 Allergy status to other drugs, medicaments and biological substances status: Secondary | ICD-10-CM

## 2018-07-21 LAB — CBC WITH DIFFERENTIAL/PLATELET
ABS IMMATURE GRANULOCYTES: 1.2 10*3/uL — AB (ref 0.00–0.07)
Basophils Absolute: 0.1 10*3/uL (ref 0.0–0.1)
Basophils Relative: 0 %
Eosinophils Absolute: 0.1 10*3/uL (ref 0.0–0.5)
Eosinophils Relative: 1 %
HCT: 29.5 % — ABNORMAL LOW (ref 39.0–52.0)
Hemoglobin: 9.6 g/dL — ABNORMAL LOW (ref 13.0–17.0)
Immature Granulocytes: 6 %
Lymphocytes Relative: 5 %
Lymphs Abs: 1.1 10*3/uL (ref 0.7–4.0)
MCH: 32 pg (ref 26.0–34.0)
MCHC: 32.5 g/dL (ref 30.0–36.0)
MCV: 98.3 fL (ref 80.0–100.0)
Monocytes Absolute: 1.7 10*3/uL — ABNORMAL HIGH (ref 0.1–1.0)
Monocytes Relative: 8 %
Neutro Abs: 17.1 10*3/uL — ABNORMAL HIGH (ref 1.7–7.7)
Neutrophils Relative %: 80 %
Platelets: 450 10*3/uL — ABNORMAL HIGH (ref 150–400)
RBC: 3 MIL/uL — AB (ref 4.22–5.81)
RDW: 13.4 % (ref 11.5–15.5)
WBC: 21.3 10*3/uL — AB (ref 4.0–10.5)
nRBC: 0 % (ref 0.0–0.2)

## 2018-07-21 LAB — URINALYSIS, ROUTINE W REFLEX MICROSCOPIC
Bilirubin Urine: NEGATIVE
GLUCOSE, UA: NEGATIVE mg/dL
Hgb urine dipstick: NEGATIVE
Ketones, ur: NEGATIVE mg/dL
Leukocytes, UA: NEGATIVE
Nitrite: NEGATIVE
Protein, ur: NEGATIVE mg/dL
Specific Gravity, Urine: 1.004 — ABNORMAL LOW (ref 1.005–1.030)
pH: 6 (ref 5.0–8.0)

## 2018-07-21 LAB — BASIC METABOLIC PANEL
Anion gap: 9 (ref 5–15)
BUN: 9 mg/dL (ref 8–23)
CO2: 27 mmol/L (ref 22–32)
Calcium: 9.5 mg/dL (ref 8.9–10.3)
Chloride: 95 mmol/L — ABNORMAL LOW (ref 98–111)
Creatinine, Ser: 0.52 mg/dL — ABNORMAL LOW (ref 0.61–1.24)
GFR calc Af Amer: >60 mL/min (ref >60)
GFR calc non Af Amer: >60 mL/min (ref >60)
Glucose, Bld: 139 mg/dL — ABNORMAL HIGH (ref 70–99)
Potassium: 3.8 mmol/L (ref 3.5–5.1)
Sodium: 131 mmol/L — ABNORMAL LOW (ref 135–145)

## 2018-07-21 MED ORDER — ONDANSETRON HCL 4 MG PO TABS: 4.0000 mg | ORAL_TABLET | Freq: Four times a day (QID) | ORAL | Status: DC | PRN

## 2018-07-21 MED ORDER — ADULT MULTIVITAMIN W/MINERALS CH
1.0000 | ORAL_TABLET | Freq: Every day | ORAL | Status: DC
  Administered 2018-07-22 – 2018-08-02 (×12): 1 via ORAL
  Filled 2018-07-21 (×13): qty 1

## 2018-07-21 MED ORDER — CROMOLYN SODIUM 4 % OP SOLN
1.0000 [drp] | Freq: Four times a day (QID) | OPHTHALMIC | Status: DC
  Administered 2018-07-21 – 2018-08-02 (×44): 1 [drp] via OPHTHALMIC
  Filled 2018-07-21: qty 10

## 2018-07-21 MED ORDER — GUAIFENESIN ER 600 MG PO TB12
600.0000 mg | ORAL_TABLET | Freq: Two times a day (BID) | ORAL | Status: DC
  Administered 2018-07-21 – 2018-08-02 (×24): 600 mg via ORAL
  Filled 2018-07-21 (×24): qty 1

## 2018-07-21 MED ORDER — PANTOPRAZOLE SODIUM 40 MG PO TBEC
40.0000 mg | DELAYED_RELEASE_TABLET | Freq: Every day | ORAL | Status: DC
  Administered 2018-07-22 – 2018-08-02 (×12): 40 mg via ORAL
  Filled 2018-07-21 (×12): qty 1

## 2018-07-21 MED ORDER — BISACODYL 5 MG PO TBEC: 5.0000 mg | DELAYED_RELEASE_TABLET | Freq: Every day | ORAL | Status: DC | PRN

## 2018-07-21 MED ORDER — FAMOTIDINE 20 MG PO TABS
20.0000 mg | ORAL_TABLET | Freq: Two times a day (BID) | ORAL | Status: DC
  Administered 2018-07-21 – 2018-08-02 (×24): 20 mg via ORAL
  Filled 2018-07-21 (×24): qty 1

## 2018-07-21 MED ORDER — LORATADINE 10 MG PO TABS
10.0000 mg | ORAL_TABLET | Freq: Every morning | ORAL | Status: DC
  Administered 2018-07-22 – 2018-08-02 (×12): 10 mg via ORAL
  Filled 2018-07-21 (×12): qty 1

## 2018-07-21 MED ORDER — TAMSULOSIN HCL 0.4 MG PO CAPS
0.4000 mg | ORAL_CAPSULE | Freq: Every morning | ORAL | Status: DC
  Administered 2018-07-22 – 2018-07-25 (×4): 0.4 mg via ORAL
  Filled 2018-07-21 (×4): qty 1

## 2018-07-21 MED ORDER — ONDANSETRON HCL 4 MG/2ML IJ SOLN: 4.0000 mg | Freq: Four times a day (QID) | INTRAMUSCULAR | Status: DC | PRN

## 2018-07-21 MED ORDER — OXYBUTYNIN CHLORIDE 5 MG PO TABS
5.0000 mg | ORAL_TABLET | Freq: Every day | ORAL | Status: DC
  Administered 2018-07-21 – 2018-08-01 (×12): 5 mg via ORAL
  Filled 2018-07-21 (×12): qty 1

## 2018-07-21 MED ORDER — HYDROMORPHONE HCL 2 MG PO TABS
2.0000 mg | ORAL_TABLET | Freq: Four times a day (QID) | ORAL | Status: DC | PRN
  Administered 2018-07-21 – 2018-07-30 (×7): 2 mg via ORAL
  Filled 2018-07-21 (×7): qty 1

## 2018-07-21 MED ORDER — POLYETHYLENE GLYCOL 3350 17 G PO PACK: 17.0000 g | PACK | Freq: Every day | ORAL | Status: DC | PRN

## 2018-07-21 MED ORDER — CEFAZOLIN SODIUM-DEXTROSE 1-4 GM/50ML-% IV SOLN
1.0000 g | Freq: Once | INTRAVENOUS | Status: AC
  Administered 2018-07-21: 1 g via INTRAVENOUS
  Filled 2018-07-21: qty 50

## 2018-07-21 MED ORDER — ACETAMINOPHEN 650 MG RE SUPP: 650.0000 mg | Freq: Four times a day (QID) | RECTAL | Status: DC | PRN

## 2018-07-21 MED ORDER — CALCIUM CARBONATE-VITAMIN D 500-200 MG-UNIT PO TABS
1.0000 | ORAL_TABLET | Freq: Two times a day (BID) | ORAL | Status: DC
  Administered 2018-07-21 – 2018-08-02 (×24): 1 via ORAL
  Filled 2018-07-21 (×25): qty 1

## 2018-07-21 MED ORDER — ASPIRIN EC 81 MG PO TBEC
81.0000 mg | DELAYED_RELEASE_TABLET | Freq: Every day | ORAL | Status: DC
  Administered 2018-07-21 – 2018-08-01 (×12): 81 mg via ORAL
  Filled 2018-07-21 (×13): qty 1

## 2018-07-21 MED ORDER — SIMETHICONE 80 MG PO CHEW
80.0000 mg | CHEWABLE_TABLET | Freq: Four times a day (QID) | ORAL | Status: DC | PRN
  Administered 2018-07-24 – 2018-07-25 (×2): 80 mg via ORAL
  Filled 2018-07-21 (×2): qty 1

## 2018-07-21 MED ORDER — LOPERAMIDE HCL 2 MG PO CAPS
2.0000 mg | ORAL_CAPSULE | ORAL | Status: DC | PRN
  Administered 2018-07-28: 2 mg via ORAL
  Filled 2018-07-21: qty 1

## 2018-07-21 MED ORDER — HYDROMORPHONE HCL 2 MG PO TABS
2.0000 mg | ORAL_TABLET | Freq: Three times a day (TID) | ORAL | Status: DC | PRN
  Filled 2018-07-21: qty 1

## 2018-07-21 MED ORDER — FERROUS SULFATE 325 (65 FE) MG PO TABS
325.0000 mg | ORAL_TABLET | Freq: Two times a day (BID) | ORAL | Status: DC
  Administered 2018-07-21 – 2018-08-02 (×23): 325 mg via ORAL
  Filled 2018-07-21 (×23): qty 1

## 2018-07-21 MED ORDER — DEXTROSE-NACL 5-0.9 % IV SOLN
INTRAVENOUS | Status: DC
  Administered 2018-07-21: 17:00:00 via INTRAVENOUS

## 2018-07-21 MED ORDER — RISAQUAD PO CAPS
1.0000 | ORAL_CAPSULE | Freq: Every day | ORAL | Status: DC
  Administered 2018-07-22 – 2018-08-02 (×12): 1 via ORAL
  Filled 2018-07-21 (×12): qty 1

## 2018-07-21 MED ORDER — MENTHOL 3 MG MT LOZG
1.0000 | LOZENGE | OROMUCOSAL | Status: DC | PRN
  Filled 2018-07-21: qty 9

## 2018-07-21 MED ORDER — ACETAMINOPHEN 325 MG PO TABS: 650.0000 mg | ORAL_TABLET | Freq: Four times a day (QID) | ORAL | Status: DC | PRN

## 2018-07-21 MED ORDER — AMLODIPINE BESYLATE 5 MG PO TABS
5.0000 mg | ORAL_TABLET | Freq: Every evening | ORAL | Status: DC
  Administered 2018-07-22 – 2018-08-01 (×11): 5 mg via ORAL
  Filled 2018-07-21 (×11): qty 1

## 2018-07-21 MED ORDER — IBUPROFEN 800 MG PO TABS
800.0000 mg | ORAL_TABLET | Freq: Three times a day (TID) | ORAL | Status: DC
  Administered 2018-07-21 – 2018-07-24 (×4): 800 mg via ORAL
  Filled 2018-07-21 (×5): qty 1

## 2018-07-21 MED ORDER — ENOXAPARIN SODIUM 40 MG/0.4ML ~~LOC~~ SOLN
40.0000 mg | SUBCUTANEOUS | Status: DC
  Administered 2018-07-21 – 2018-08-01 (×12): 40 mg via SUBCUTANEOUS
  Filled 2018-07-21 (×12): qty 0.4

## 2018-07-21 MED ORDER — SODIUM CHLORIDE 0.9 % IV SOLN
1.0000 g | Freq: Two times a day (BID) | INTRAVENOUS | Status: DC
  Administered 2018-07-21 – 2018-07-22 (×2): 1 g via INTRAVENOUS
  Filled 2018-07-21 (×2): qty 1

## 2018-07-21 MED ORDER — LOSARTAN POTASSIUM 50 MG PO TABS
100.0000 mg | ORAL_TABLET | Freq: Every morning | ORAL | Status: DC
  Administered 2018-07-22 – 2018-08-02 (×12): 100 mg via ORAL
  Filled 2018-07-21 (×12): qty 2

## 2018-07-21 NOTE — Consult Note (Signed)
Urology Consult   Physician requesting consult: Orlie Dakin, MD  Reason for consult: Urinary retention with scrotal edema and cellulitis  History of Present Illness: Sean Cooke is a 84 y.o. with a history of BPH with LUTS, pancreatic cancer, lung cancer, peripheral edema and chronic anemia.  The patient presented to the ED with a several hour history of suprapubic pain and an inability to void.  He had a Foley catheter placed in the ED, which drained 1200 mL immediately after placement.  He was seen in the office by Dr. Karsten Ro on 07/18/18 and had a scrotal US performed on 12/4 which was revealed bilateral hydroceles and scrotal edema but no signs of an infectious process.  We was noted to have bilateral pitting pedal edema and scrotal edema at his OV.  In the ER, the patient was found to have an elevated WBC 21.3, but he has remained abefrile and his other vital signs are WNL. UA is clear.   The patient has a long history of BPH with incomplete bladder emptying (PVR's are generally >150 mL) and is currently on tamsulosin once daily.  Currently, the patient is resting comfortably in bed and has no complaints.   Past Medical History:  Diagnosis Date  . Anemia   . Arthritis   . Benign localized prostatic hyperplasia with lower urinary tract symptoms (LUTS)   . Bilateral edema of lower extremity   . Borderline diabetes   . Chronic low back pain   . DDD (degenerative disc disease), lumbar   . Degenerative joint disease   . Diverticulosis   . Duodenal diverticulum   . Encounter for antineoplastic chemotherapy 07/2015   lung cancer--- per pt completed chemo 06/ 2018  . Fatigue   . Generalized weakness   . GERD (gastroesophageal reflux disease)   . Hemorrhoids   . Herniated intervertebral disc of lumbar spine    L4-5  . Hiatal hernia   . History of adenomatous polyp of colon   . Hypertension   . Lumbar stenosis    L4-5  . 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 Pancreatic cancer [facility]) oncologist-  dr Julien Nordmann   new dx 09/ 2019  . Poor dental hygiene   . Wears hearing aid in both ears     Past Surgical History:  Procedure Laterality Date  . CATARACT EXTRACTION W/ INTRAOCULAR LENS  IMPLANT, BILATERAL  2004  . ESOPHAGOGASTRODUODENOSCOPY (EGD) WITH PROPOFOL N/A 07/08/2018   Procedure: ESOPHAGOGASTRODUODENOSCOPY (EGD) WITH PROPOFOL;  Surgeon: Rush Landmark Telford Nab., MD;  Location: [facility];  Service: Gastroenterology;  Laterality: N/A;  . EUS N/A 05/16/2018   Procedure: UPPER ENDOSCOPIC ULTRASOUND (EUS) RADIAL;  Surgeon: Milus Banister, MD;  Location: [facility];  Service: Endoscopy;  Laterality: N/A;  . EUS N/A 07/08/2018   Procedure: UPPER ENDOSCOPIC ULTRASOUND (EUS) RADIAL;  Surgeon: Rush Landmark Telford Nab., MD;  Location: [facility];  Service: Gastroenterology;  Laterality: N/A;  . FINE NEEDLE ASPIRATION N/A 05/16/2018   Procedure: FINE NEEDLE ASPIRATION (FNA) LINEAR;  Surgeon: Milus Banister, MD;  Location: [facility];  Service: Endoscopy;  Laterality: N/A;  . INGUINAL HERNIA REPAIR  1980s   unilateral , pt unsure which side  . LAPAROSCOPIC CHOLECYSTECTOMY  07-02-2003   dr gerkin @[facility]   . SPLENECTOMY  10-11-2000   @[facility]   via exploratory laparotomy for blunt abdominal trauma  . TRANSURETHRAL RESECTION OF PROSTATE  1988    Current Hospital Medications:  Home Meds:  Current Meds  Medication Sig  . amLODipine (NORVASC) 5 MG tablet Take 5 mg by mouth every evening.   Marland Kitchen antiseptic oral rinse (BIOTENE) LIQD 15 mLs by Mouth Rinse route 5 (five) times daily as needed for dry mouth.   Marland Kitchen aspirin 81 MG tablet Take 81 mg by mouth at bedtime.   . betamethasone dipropionate (DIPROLENE) 0.05 % cream Apply 1 application topically daily as needed (irritation).   . bisacodyl  (DULCOLAX) 5 MG EC tablet Take 5 mg by mouth daily as needed for moderate constipation.  . Calcium Carb-Cholecalciferol (CALCIUM-VITAMIN D) 500-200 MG-UNIT tablet Take 1 tablet by mouth 2 (two) times daily.   . cromolyn (OPTICROM) 4 % ophthalmic solution Place 1 drop into both eyes 4 (four) times daily.   Marland Kitchen denosumab (PROLIA) 60 MG/ML SOLN injection Inject 60 mg into the skin every 6 (six) months. Administer in upper arm, thigh, or abdomen  . famotidine (PEPCID) 20 MG tablet Take 20 mg by mouth 2 (two) times daily.  . ferrous sulfate 325 (65 FE) MG tablet Take 325 mg by mouth 2 (two) times daily with a meal.   . fluconazole (DIFLUCAN) 200 MG tablet Take 1 tablet (200 mg total) by mouth daily for 1 day, THEN 0.5 tablets (100 mg total) daily for 14 days.  Marland Kitchen guaiFENesin (MUCINEX) 600 MG 12 hr tablet Take 600 mg by mouth every 12 (twelve) hours.   Marland Kitchen HYDROmorphone (DILAUDID) 2 MG tablet Take 1 tablet by mouth every 8 (eight) hours as needed for moderate pain.   Marland Kitchen ibuprofen (ADVIL,MOTRIN) 800 MG tablet Take 800 mg by mouth 3 (three) times daily after meals.   . Lidocaine, Anorectal, 5 % CREA Apply externally to affected anorectal area up to 6 times/day  . lidocaine-prilocaine (EMLA) cream Apply 1 application topically as needed. (Patient taking differently: Apply 1 application topically as needed (port access). )  . Liniments (SALONPAS ARTHRITIS PAIN RELIEF EX) Apply 1 patch topically daily as needed (pain).   Marland Kitchen loperamide (IMODIUM) 2 MG capsule Take 2 mg by mouth as needed for diarrhea or loose stools. Reported on 03/01/2016  . loratadine (CLARITIN) 10 MG tablet Take 10 mg by mouth every morning.   Marland Kitchen losartan (COZAAR) 100 MG tablet Take 100 mg by mouth every morning.   . Multiple Vitamin (MULTIVITAMIN) tablet Take 1 tablet by mouth daily.  . Omega-3 Fatty Acids (FISH OIL) 1000 MG CAPS Take 1,000 mg by mouth daily.   Marland Kitchen oxybutynin (DITROPAN) 5 MG tablet Take 5 mg by mouth at bedtime.   . pantoprazole  (PROTONIX) 40 MG tablet Take 40 mg by mouth daily.   . polyethylene glycol (MIRALAX / GLYCOLAX) packet Take 17 g by mouth daily as needed (constipation.).   Marland Kitchen Probiotic Product (ALIGN) 4 MG CAPS Take 4 mg by mouth daily.   . saw palmetto 160 MG capsule Take 160 mg by mouth daily.  . simethicone (MYLICON) 80 MG chewable tablet Chew 80 mg by mouth every 6 (six) hours as needed for flatulence.  . Tamsulosin HCl (FLOMAX) 0.4 MG CAPS Take 0.4 mg by mouth every morning.   . Turmeric 500 MG TABS Take 500 mg by mouth daily after supper.     Scheduled Meds: . [START ON 07/22/2018] acidophilus  1 capsule Oral Daily  . [START ON 07/22/2018] amLODipine  5 mg Oral  QPM  . aspirin EC  81 mg Oral QHS  . calcium-vitamin D  1 tablet Oral BID  . cromolyn  1 drop Both Eyes QID  . enoxaparin (LOVENOX) injection  40 mg Subcutaneous Q24H  . famotidine  20 mg Oral BID  . ferrous sulfate  325 mg Oral BID WC  . guaiFENesin  600 mg Oral Q12H  . [START ON 07/22/2018] loratadine  10 mg Oral q morning - 10a  . [START ON 07/22/2018] losartan  100 mg Oral q morning - 10a  . [START ON 07/22/2018] multivitamin with minerals  1 tablet Oral Daily  . oxybutynin  5 mg Oral QHS  . [START ON 07/22/2018] pantoprazole  40 mg Oral Daily  . [START ON 07/22/2018] tamsulosin  0.4 mg Oral q morning - 10a   Continuous Infusions: . ceFEPime (MAXIPIME) IV 1 g (07/21/18 1651)  . dextrose 5 % and 0.9% NaCl 50 mL/hr at 07/21/18 1657   PRN Meds:.acetaminophen **OR** acetaminophen, bisacodyl, HYDROmorphone, loperamide, ondansetron **OR** ondansetron (ZOFRAN) IV, polyethylene glycol, simethicone  Allergies:  Allergies  Allergen Reactions  . Benzonatate Other (See Comments)    D izziness  . Lyrica [Pregabalin] Diarrhea  . Gabapentin Other (See Comments)    Constipation   . Tizanidine Other (See Comments)    Unknown reaction - unable to remember  . Penicillins Rash    Has patient had a PCN reaction causing immediate rash,  facial/tongue/throat swelling, SOB or lightheadedness with hypotension: unknown Has patient had a PCN reaction causing severe rash involving mucus membranes or skin necrosis: unknown Has patient had a PCN reaction that required hospitalization : unknown Has patient had a PCN reaction occurring within the last 10 years: no, childhood allergy If all of the above answers are "NO", then may proceed with Cephalosporin use.   . Tramadol Other (See Comments)    Constipation     Family History  Problem Relation Age of Onset  . Diabetes Father   . Colon cancer Neg Hx     Social History:  reports that he quit smoking about 29 years ago. His smoking use included cigarettes and pipe. He quit after 30.00 years of use. He has never used smokeless tobacco. He reports that he drank alcohol. He reports that he does not use drugs.  ROS: A complete review of systems was performed.  All systems are negative except for pertinent findings as noted.  Physical Exam:  Vital signs in last 24 hours: Temp:  [97.5 F (36.4 C)-97.9 F (36.6 C)] 97.5 F (36.4 C) (12/08 1514) Pulse Rate:  [69-103] 73 (12/08 1514) Resp:  [16-18] 18 (12/08 1514) BP: (154-163)/(67-82) 163/73 (12/08 1514) SpO2:  [94 %-98 %] 94 % (12/08 1514) Weight:  [70 kg] 70 kg (12/08 1514) Constitutional:  Alert and oriented, No acute distress Cardiovascular: Regular rate and rhythm, No JVD Respiratory: Normal respiratory effort, Lungs clear bilaterally GI: Abdomen is soft, nontender, nondistended, no abdominal masses GU: Foley in place and draining clear-yellow urine.  Mild penoscrotal edema.  The scrotum is mildly erythematous with no signs of crepitus or skin necrosis Lymphatic: No lymphadenopathy Neurologic: Grossly intact, no focal deficits Psychiatric: Normal mood and affect  Laboratory Data:  Recent Labs    07/21/18 1228  WBC 21.3*  HGB 9.6*  HCT 29.5*  PLT 450*    Recent Labs    07/21/18 1228  NA 131*  K 3.8  CL 95*   GLUCOSE 139*  BUN 9  CALCIUM 9.5  CREATININE 0.52*  Results for orders placed or performed during the hospital encounter of 07/21/18 (from the past 24 hour(s))  Basic metabolic panel     Status: Abnormal   Collection Time: 07/21/18 12:28 PM  Result Value Ref Range   Sodium 131 (L) 135 - 145 mmol/L   Potassium 3.8 3.5 - 5.1 mmol/L   Chloride 95 (L) 98 - 111 mmol/L   CO2 27 22 - 32 mmol/L   Glucose, Bld 139 (H) 70 - 99 mg/dL   BUN 9 8 - 23 mg/dL   Creatinine, Ser 0.52 (L) 0.61 - 1.24 mg/dL   Calcium 9.5 8.9 - 10.3 mg/dL   GFR calc non Af Amer >60 >60 mL/min   GFR calc Af Amer >60 >60 mL/min   Anion gap 9 5 - 15  CBC with Differential/Platelet     Status: Abnormal   Collection Time: 07/21/18 12:28 PM  Result Value Ref Range   WBC 21.3 (H) 4.0 - 10.5 K/uL   RBC 3.00 (L) 4.22 - 5.81 MIL/uL   Hemoglobin 9.6 (L) 13.0 - 17.0 g/dL   HCT 29.5 (L) 39.0 - 52.0 %   MCV 98.3 80.0 - 100.0 fL   MCH 32.0 26.0 - 34.0 pg   MCHC 32.5 30.0 - 36.0 g/dL   RDW 13.4 11.5 - 15.5 %   Platelets 450 (H) 150 - 400 K/uL   nRBC 0.0 0.0 - 0.2 %   Neutrophils Relative % 80 %   Neutro Abs 17.1 (H) 1.7 - 7.7 K/uL   Lymphocytes Relative 5 %   Lymphs Abs 1.1 0.7 - 4.0 K/uL   Monocytes Relative 8 %   Monocytes Absolute 1.7 (H) 0.1 - 1.0 K/uL   Eosinophils Relative 1 %   Eosinophils Absolute 0.1 0.0 - 0.5 K/uL   Basophils Relative 0 %   Basophils Absolute 0.1 0.0 - 0.1 K/uL   RBC Morphology CRENATED RBCS NOTED    Immature Granulocytes 6 %   Abs Immature Granulocytes 1.20 (H) 0.00 - 0.07 K/uL  Urinalysis, Routine w reflex microscopic     Status: Abnormal   Collection Time: 07/21/18 12:29 PM  Result Value Ref Range   Color, Urine STRAW (A) YELLOW   APPearance CLEAR CLEAR   Specific Gravity, Urine 1.004 (L) 1.005 - 1.030   pH 6.0 5.0 - 8.0   Glucose, UA NEGATIVE NEGATIVE mg/dL   Hgb urine dipstick NEGATIVE NEGATIVE   Bilirubin Urine NEGATIVE NEGATIVE   Ketones, ur NEGATIVE NEGATIVE mg/dL    Protein, ur NEGATIVE NEGATIVE mg/dL   Nitrite NEGATIVE NEGATIVE   Leukocytes, UA NEGATIVE NEGATIVE   No results found for this or any previous visit (from the past 240 hour(s)).  Renal Function: Recent Labs    07/17/18 1243 07/21/18 1228  CREATININE 0.72 0.52*   Estimated Creatinine Clearance: 60.6 mL/min (A) (by C-G formula based on SCr of 0.52 mg/dL (L)).  Radiologic Imaging: No results found.  I independently reviewed the above imaging studies.  Impression/Recommendation Urinary retention Scrotal and pedal edema from volume overload Mild scrotal cellulitis   -Keep Foley in place for at least 5 days and continue tamsulosin -Keep scrotum elevated and ambulate the patient as much as he can tolerate.  His scrotal erythema is a byproduct of his volume overload.  Recommend diuresis.  -Abx per primary team.  I feel like his scrotal process will improve once his edema is addressed.   Ellison Hughs, MD [facility] 07/21/2018, 5:03 PM

## 2018-07-21 NOTE — Progress Notes (Signed)
Pharmacy Antibiotic Note  Sean Cooke is a 84 y.o. male admitted on 07/21/2018 with scrotal cellulitis.  Pharmacy has been consulted for cefepime dosing.  Plan: Cefepime 1g IV q12     Temp (24hrs), Avg:97.8 F (36.6 C), Min:97.6 F (36.4 C), Max:97.9 F (36.6 C)  Recent Labs  Lab 07/17/18 1243 07/21/18 1228  WBC 21.7* 21.3*  CREATININE 0.72 0.52*    Estimated Creatinine Clearance: 60.6 mL/min (A) (by C-G formula based on SCr of 0.52 mg/dL (L)).    Allergies  Allergen Reactions  . Benzonatate Other (See Comments)    D izziness  . Lyrica [Pregabalin] Diarrhea  . Gabapentin Other (See Comments)    Constipation   . Tizanidine Other (See Comments)    Unknown reaction - unable to remember  . Penicillins Rash    Has patient had a PCN reaction causing immediate rash, facial/tongue/throat swelling, SOB or lightheadedness with hypotension: unknown Has patient had a PCN reaction causing severe rash involving mucus membranes or skin necrosis: unknown Has patient had a PCN reaction that required hospitalization : unknown Has patient had a PCN reaction occurring within the last 10 years: no, childhood allergy If all of the above answers are "NO", then may proceed with Cephalosporin use.   . Tramadol Other (See Comments)    Constipation     Thank you for allowing pharmacy to be a part of this patient's care.  Kara Mead 07/21/2018 2:56 PM

## 2018-07-21 NOTE — ED Provider Notes (Signed)
[facility] DEPT Provider Note   CSN: [id number] Arrival date & time: 07/21/18  3557     History   Chief Complaint Chief Complaint  Patient presents with  . Groin Swelling  . Constipation    HPI Sean Cooke is a 84 y.o. male.  Patient reports difficulty urinating he is unable to empty his bladder he has urgency but trouble voiding for the past several days.  This morning he attempted to urinate but could not.  He feels as if his bladder is full.  He has been constipated for the past several days but had several bowel movements this morning and that no longer feels constipated he does have severe pain at his anus with defecation.  His scrotum has been reddened for the past 1 to 2 weeks.  He is treated himself with laxatives with improvement of constipation.  Denies abdominal pain, vomiting or nausea.  Denies fever.  HPI  Past Medical History:  Diagnosis Date  . Anemia   . Arthritis   . Benign localized prostatic hyperplasia with lower urinary tract symptoms (LUTS)   . Bilateral edema of lower extremity   . Borderline diabetes   . Chronic low back pain   . DDD (degenerative disc disease), lumbar   . Degenerative joint disease   . Diverticulosis   . Duodenal diverticulum   . Encounter for antineoplastic chemotherapy 07/2015   lung cancer--- per pt completed chemo 06/ 2018  . Fatigue   . Generalized weakness   . GERD (gastroesophageal reflux disease)   . Hemorrhoids   . Herniated intervertebral disc of lumbar spine    L4-5  . Hiatal hernia   . History of adenomatous polyp of colon   . Hypertension   . Lumbar stenosis    L4-5  . 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 Pancreatic cancer [facility]) oncologist-  dr Julien Nordmann   new dx 09/ 2019  . Poor dental hygiene     . Wears hearing aid in both ears     Patient Active Problem List   Diagnosis Date Noted  . Coagulopathy ([place]) 06/21/2018  . Preoperative clearance 05/22/2018  . Primary adenocarcinoma of body of pancreas ([place]) 04/18/2018  . Diarrhea 10/29/2015  . Encounter for antineoplastic chemotherapy 07/31/2015  . Malignant neoplasm of right upper lobe of lung ([place]) 07/15/2015  . Hyponatremia 10/07/2014  . Community acquired bacterial pneumonia 10/04/2014  . Leukocytosis 10/04/2014  . Essential hypertension 10/04/2014  . BPH (benign prostatic hyperplasia) 10/04/2014  . Abdominal pain, LLQ (left lower quadrant) 04/03/2014  . Constipation 08/20/2013  . Lactose intolerance 08/20/2013  . GERD 04/27/2008  . THROMBOCYTHEMIA 04/23/2008  . Normocytic anemia 04/23/2008  . SPONDYLOSIS, LUMBAR 04/23/2008  . NEURITIS, LUMBOSACRAL 04/23/2008  . OSTEOPENIA 04/23/2008  . ASPLENIA 04/23/2008  . URINARY FREQUENCY 04/23/2008  . COLONIC POLYPS, ADENOMATOUS, HX OF 04/23/2008  Recently diagnosed with pancreatic cancer  Past Surgical History:  Procedure Laterality Date  . CATARACT EXTRACTION W/ INTRAOCULAR LENS  IMPLANT, BILATERAL  2004  . ESOPHAGOGASTRODUODENOSCOPY (EGD) WITH PROPOFOL N/A 07/08/2018   Procedure: ESOPHAGOGASTRODUODENOSCOPY (EGD) WITH PROPOFOL;  Surgeon: Rush Landmark Telford Nab., MD;  Location: [facility] ENDOSCOPY;  Service: Gastroenterology;  Laterality: N/A;  . EUS N/A 05/16/2018   Procedure: UPPER ENDOSCOPIC ULTRASOUND (EUS) RADIAL;  Surgeon: Milus Banister, MD;  Location: [facility] ENDOSCOPY;  Service: Endoscopy;  Laterality: N/A;  . EUS N/A 07/08/2018   Procedure: UPPER ENDOSCOPIC ULTRASOUND (EUS) RADIAL;  Surgeon: Rush Landmark Telford Nab., MD;  Location: [facility] ENDOSCOPY;  Service: Gastroenterology;  Laterality: N/A;  . FINE NEEDLE ASPIRATION N/A 05/16/2018   Procedure: FINE NEEDLE ASPIRATION (FNA) LINEAR;  Surgeon: Milus Banister, MD;  Location: [facility] ENDOSCOPY;  Service: Endoscopy;  Laterality: N/A;  .  INGUINAL HERNIA REPAIR  1980s   unilateral , pt unsure which side  . LAPAROSCOPIC CHOLECYSTECTOMY  07-02-2003   dr gerkin @[facility]   . SPLENECTOMY  10-11-2000   @[facility]    via exploratory laparotomy for blunt abdominal trauma  . TRANSURETHRAL RESECTION OF PROSTATE  1988        Home Medications    Prior to Admission medications   Medication Sig Start Date End Date Taking? Authorizing Provider  amLODipine (NORVASC) 5 MG tablet Take 5 mg by mouth every evening.     [provider]  antiseptic oral rinse (BIOTENE) LIQD 15 mLs by Mouth Rinse route 5 (five) times daily as needed for dry mouth.     [provider]  aspirin 81 MG tablet Take 81 mg by mouth at bedtime.     [provider]  betamethasone dipropionate (DIPROLENE) 0.05 % cream Apply 1 application topically daily as needed (irritation).     [provider]  bisacodyl (DULCOLAX) 5 MG EC tablet Take 5 mg by mouth daily as needed for moderate constipation.    [provider]  Calcium Carb-Cholecalciferol (CALCIUM-VITAMIN D) 500-200 MG-UNIT tablet Take 1 tablet by mouth 2 (two) times daily.     [provider]  cromolyn (OPTICROM) 4 % ophthalmic solution Place 1 drop into both eyes 4 (four) times daily.  07/25/16   [provider]  denosumab (PROLIA) 60 MG/ML SOLN injection Inject 60 mg into the skin every 6 (six) months. Administer in upper arm, thigh, or abdomen    [provider]  famotidine (PEPCID) 20 MG tablet Take 20 mg by mouth 2 (two) times daily.    [provider]  ferrous sulfate 325 (65 FE) MG tablet Take 325 mg by mouth 2 (two) times daily with a meal.     [provider]  fluconazole (DIFLUCAN) 200 MG tablet Take 1 tablet (200 mg total) by mouth daily for 1 day, THEN 0.5 tablets (100 mg total) daily for 14 days. 07/10/18 07/25/18  Mansouraty, Telford Nab., MD  guaiFENesin (MUCINEX) 600 MG 12 hr tablet Take 600 mg by mouth every 12 (twelve)  hours.     [provider]  HYDROmorphone (DILAUDID) 2 MG tablet Take 1 tablet by mouth every 8 (eight) hours as needed for moderate pain.     [provider]  ibuprofen (ADVIL,MOTRIN) 800 MG tablet Take 800 mg by mouth 3 (three) times daily after meals.     [provider]  Lidocaine, Anorectal, 5 % CREA Apply externally to affected anorectal area up to 6 times/day 07/17/18   Maczis, Barth Kirks, PA-C  lidocaine-prilocaine (EMLA) cream Apply 1 application topically as needed. Patient taking differently: Apply 1 application topically as needed (port access).  09/27/15   Curt Bears, MD  [facility] ARTHRITIS PAIN RELIEF EX) Apply 1 patch topically daily as needed (pain).     [provider]  loperamide (IMODIUM) 2 MG capsule Take 2 mg by mouth as needed for diarrhea or loose stools. Reported on 03/01/2016    [provider]  loratadine (  CLARITIN) 10 MG tablet Take 10 mg by mouth every morning.     [provider]  losartan (COZAAR) 100 MG tablet Take 100 mg by mouth every morning.  03/29/16   [provider]  Multiple Vitamin (MULTIVITAMIN) tablet Take 1 tablet by mouth daily.    [provider]  Omega-3 Fatty Acids (FISH OIL) 1000 MG CAPS Take 1,000 mg by mouth daily.     [provider]  oxybutynin (DITROPAN) 5 MG tablet Take 5 mg by mouth at bedtime.     [provider]  pantoprazole (PROTONIX) 40 MG tablet Take 40 mg by mouth daily.     [provider]  polyethylene glycol (MIRALAX / GLYCOLAX) packet Take 17 g by mouth daily as needed (constipation.).     [provider]  Probiotic Product (ALIGN) 4 MG CAPS Take 4 mg by mouth daily.     [provider]  saw palmetto 160 MG capsule Take 160 mg by mouth daily.    [provider]  simethicone (MYLICON) 80 MG chewable tablet Chew 80 mg by mouth every 6 (six) hours as needed for flatulence.    [provider]    Tamsulosin HCl (FLOMAX) 0.4 MG CAPS Take 0.4 mg by mouth every morning.     [provider]  Turmeric 500 MG TABS Take 500 mg by mouth daily after supper.     [provider]    Family History Family History  Problem Relation Age of Onset  . Diabetes Father   . Colon cancer Neg Hx     Social History Social History   Tobacco Use  . Smoking status: Former Smoker    Years: 30.00    Types: Cigarettes, Pipe    Last attempt to quit: 08/14/1988    Years since quitting: 29.9  . Smokeless tobacco: Never Used  . Tobacco comment: 06-13-2018 per pt quit cigarettes in 1960;  quit pipe 1990  Substance Use Topics  . Alcohol use: Not Currently    Alcohol/week: 0.0 standard drinks  . Drug use: Never     Allergies   Benzonatate; Lyrica [pregabalin]; Gabapentin; Tizanidine; Penicillins; and Tramadol   Review of Systems Review of Systems  Cardiovascular: Positive for leg swelling.       Bilateral leg swelling for several weeks  Gastrointestinal:       Anal pain , worse with defacation  Genitourinary: Positive for scrotal swelling and urgency.       Scrotum swollen and reddened  Allergic/Immunologic: Positive for immunocompromised state.       Diabetic, cancer patient  All other systems reviewed and are negative.    Physical Exam Updated Vital Signs BP (!) 154/82   Pulse (!) 103   Temp 97.6 F (36.4 C)   Resp 17   SpO2 98%   Physical Exam  Constitutional: He appears well-developed and well-nourished.  HENT:  Head: Normocephalic and atraumatic.  Eyes: Pupils are equal, round, and reactive to light. Conjunctivae are normal.  Neck: Neck supple. No tracheal deviation present. No thyromegaly present.  Cardiovascular: Normal rate and regular rhythm.  No murmur heard. Pulmonary/Chest: Effort normal and breath sounds normal.  Abdominal: Soft. Bowel sounds are normal. He exhibits no distension. There is no tenderness.  Genitourinary: Penis normal.  Genitourinary  Comments: Scrotum reddened swollen, tender.  Rectum normal tone soft brown stool.  Tender at anus on insertion of finger.  No anal fissure noted  Musculoskeletal: Normal range of motion. He exhibits no  edema or tenderness.  Neurological: He is alert. Coordination normal.  Skin: Skin is warm and dry. No rash noted.  Psychiatric: He has a normal mood and affect.  Nursing note and vitals reviewed.    ED Treatments / Results  Labs (all labs ordered are listed, but only abnormal results are displayed) Labs Reviewed - No data to display  EKG None  Radiology No results found.  Procedures Procedures (including critical care time)  Medications Ordered in ED Medications - No data to display Foley catheter inserted and the patient by nurse.  Foley drained greater than 1 L of urine. Results for orders placed or performed during the hospital encounter of 59/93/57  Basic metabolic panel  Result Value Ref Range   Sodium 131 (L) 135 - 145 mmol/L   Potassium 3.8 3.5 - 5.1 mmol/L   Chloride 95 (L) 98 - 111 mmol/L   CO2 27 22 - 32 mmol/L   Glucose, Bld 139 (H) 70 - 99 mg/dL   BUN 9 8 - 23 mg/dL   Creatinine, Ser 0.52 (L) 0.61 - 1.24 mg/dL   Calcium 9.5 8.9 - 10.3 mg/dL   GFR calc non Af Amer >60 >60 mL/min   GFR calc Af Amer >60 >60 mL/min   Anion gap 9 5 - 15  CBC with Differential/Platelet  Result Value Ref Range   WBC 21.3 (H) 4.0 - 10.5 K/uL   RBC 3.00 (L) 4.22 - 5.81 MIL/uL   Hemoglobin 9.6 (L) 13.0 - 17.0 g/dL   HCT 29.5 (L) 39.0 - 52.0 %   MCV 98.3 80.0 - 100.0 fL   MCH 32.0 26.0 - 34.0 pg   MCHC 32.5 30.0 - 36.0 g/dL   RDW 13.4 11.5 - 15.5 %   Platelets 450 (H) 150 - 400 K/uL   nRBC 0.0 0.0 - 0.2 %   Neutrophils Relative % 80 %   Neutro Abs 17.1 (H) 1.7 - 7.7 K/uL   Lymphocytes Relative 5 %   Lymphs Abs 1.1 0.7 - 4.0 K/uL   Monocytes Relative 8 %   Monocytes Absolute 1.7 (H) 0.1 - 1.0 K/uL   Eosinophils Relative 1 %   Eosinophils Absolute 0.1 0.0 - 0.5 K/uL    Basophils Relative 0 %   Basophils Absolute 0.1 0.0 - 0.1 K/uL   RBC Morphology CRENATED RBCS NOTED    Immature Granulocytes 6 %   Abs Immature Granulocytes 1.20 (H) 0.00 - 0.07 K/uL  Urinalysis, Routine w reflex microscopic  Result Value Ref Range   Color, Urine STRAW (A) YELLOW   APPearance CLEAR CLEAR   Specific Gravity, Urine 1.004 (L) 1.005 - 1.030   pH 6.0 5.0 - 8.0   Glucose, UA NEGATIVE NEGATIVE mg/dL   Hgb urine dipstick NEGATIVE NEGATIVE   Bilirubin Urine NEGATIVE NEGATIVE   Ketones, ur NEGATIVE NEGATIVE mg/dL   Protein, ur NEGATIVE NEGATIVE mg/dL   Nitrite NEGATIVE NEGATIVE   Leukocytes, UA NEGATIVE NEGATIVE   Dg Chest 2 View  Result Date: 07/17/2018 CLINICAL DATA:  Constipation.  Painful stools.  Scrotal swelling. EXAM: CHEST - 2 VIEW COMPARISON:  CT of the chest April 16, 2018 FINDINGS: Air-filled bowel is under the right hemidiaphragm. No convincing evidence of free air. The right hemidiaphragm is elevated. The heart, hila, mediastinum are normal. No pneumothorax. No nodules or masses. A right Port-A-Cath is in good position. IMPRESSION: No active cardiopulmonary disease. Electronically Signed   By: Dorise Bullion III M.D   On: 07/17/2018 14:09  Ct Head Wo Contrast  Result Date: 07/17/2018 CLINICAL DATA:  Constipation, painful stools, lower extremity swelling for years, fall yesterday, minor head trauma. EXAM: CT HEAD WITHOUT CONTRAST TECHNIQUE: Contiguous axial images were obtained from the base of the skull through the vertex without intravenous contrast. COMPARISON:  Head CT dated 11/07/2017 FINDINGS: Brain: Ventricles are stable in size and configuration. Mild generalized age related parenchymal volume loss with commensurate dilatation of the ventricles and sulci. Mild chronic small vessel ischemic changes within the periventricular white matter regions bilaterally. No mass, hemorrhage, edema or other evidence of acute parenchymal abnormality. No extra-axial  hemorrhage. Vascular: Chronic calcified atherosclerotic changes of the large vessels at the skull base. No unexpected hyperdense vessel. Skull: Normal. Negative for fracture or focal lesion. Sinuses/Orbits: No acute finding. Other: None. IMPRESSION: 1. No acute findings. No intracranial mass, hemorrhage or edema. No skull fracture. 2. Mild chronic small vessel ischemic changes within the white matter. Electronically Signed   By: Franki Cabot M.D.   On: 07/17/2018 14:59   US Scrotum W/doppler  Result Date: 07/17/2018 CLINICAL DATA:  Swelling EXAM: SCROTAL ULTRASOUND DOPPLER ULTRASOUND OF THE TESTICLES TECHNIQUE: Complete ultrasound examination of the testicles, epididymis, and other scrotal structures was performed. Color and spectral Doppler ultrasound were also utilized to evaluate blood flow to the testicles. COMPARISON:  None. FINDINGS: Right testicle Measurements: 2.9 x 2.3 x 2.5 cm. No mass or microlithiasis visualized. Left testicle Measurements: 3 x 2.7 x 2.2 cm. No mass or microlithiasis visualized. Right epididymis:  Normal in size and appearance. Left epididymis:  Normal in size and appearance. Hydrocele:  Large hydroceles bilaterally. Varicocele:  None visualized. Pulsed Doppler interrogation of both testes demonstrates normal low resistance arterial and venous waveforms bilaterally. At least mild thickening of the bilateral scrotal walls, presumably due to soft tissue edema. No circumscribed mass or fluid collection within these superficial soft tissues. IMPRESSION: 1. Both testicles appear normal. No evidence of testicular mass. No evidence of testicular torsion or orchitis. 2. Large bilateral hydroceles. 3. Diffuse scrotal wall thickening, presumably some degree of soft tissue edema. Electronically Signed   By: Franki Cabot M.D.   On: 07/17/2018 14:57   Vas [place] Lower Extremity Venous (dvt) (mc And Wl 7a-7p)  Result Date: 07/17/2018  Lower Venous Study Indications: Swelling.  Performing  Technologist: Oliver Hum RVT  Examination Guidelines: A complete evaluation includes B-mode imaging, spectral Doppler, color Doppler, and power Doppler as needed of all accessible portions of each vessel. Bilateral testing is considered an integral part of a complete examination. Limited examinations for reoccurring indications may be performed as noted.  Right Venous Findings: +---------+---------------+---------+-----------+----------+-------+          CompressibilityPhasicitySpontaneityPropertiesSummary +---------+---------------+---------+-----------+----------+-------+ CFV      Full           Yes      Yes                          +---------+---------------+---------+-----------+----------+-------+ SFJ      Full                                                 +---------+---------------+---------+-----------+----------+-------+ FV Prox  Full                                                 +---------+---------------+---------+-----------+----------+-------+  FV Mid   Full                                                 +---------+---------------+---------+-----------+----------+-------+ FV DistalFull                                                 +---------+---------------+---------+-----------+----------+-------+ PFV      Full                                                 +---------+---------------+---------+-----------+----------+-------+ POP      Full           Yes      Yes                          +---------+---------------+---------+-----------+----------+-------+ PTV      Full                                                 +---------+---------------+---------+-----------+----------+-------+ PERO     Full                                                 +---------+---------------+---------+-----------+----------+-------+  Left Venous Findings: +---------+---------------+---------+-----------+----------+-------+           CompressibilityPhasicitySpontaneityPropertiesSummary +---------+---------------+---------+-----------+----------+-------+ CFV      Full           Yes      Yes                          +---------+---------------+---------+-----------+----------+-------+ SFJ      Full                                                 +---------+---------------+---------+-----------+----------+-------+ FV Prox  Full                                                 +---------+---------------+---------+-----------+----------+-------+ FV Mid   Full                                                 +---------+---------------+---------+-----------+----------+-------+ FV DistalFull                                                 +---------+---------------+---------+-----------+----------+-------+  PFV      Full                                                 +---------+---------------+---------+-----------+----------+-------+ POP      Full           Yes      Yes                          +---------+---------------+---------+-----------+----------+-------+ PTV      Full                                                 +---------+---------------+---------+-----------+----------+-------+ PERO     Full                                                 +---------+---------------+---------+-----------+----------+-------+    Summary: Right: There is no evidence of deep vein thrombosis in the lower extremity. No cystic structure found in the popliteal fossa. Left: There is no evidence of deep vein thrombosis in the lower extremity. No cystic structure found in the popliteal fossa.  *See table(s) above for measurements and observations. Electronically signed by Quay Burow MD on 07/17/2018 at 4:48:00 PM.    Final    Initial Impression / Assessment and Plan / ED Course  I have reviewed the triage vital signs and the nursing notes.  Pertinent labs & imaging results that were available  during my care of the patient were reviewed by me and considered in my medical decision making (see chart for details).     Patient immune compromised.  Profound leukocytosis.  Cellulitis of scrotum present.  I consulted hospitalist Dr.Arrien who will arrange for overnight stay.  I also consulted Dr. Lovena Neighbours from urology service who will evaluate patient in the hospital. Lab work consistent with profound leukocytosis, anemia leukocytosis is essentially unchanged from 1 month ago however in light of cellulitis, presumed leukemoid reaction secondary to infection.  Anemia essentially unchanged from 1 month ago. Final Clinical Impressions(s) / ED Diagnoses  Diagnoses #1 cellulitis of scrotum #2 urinary retention Final diagnoses:  None    ED Discharge Orders    None       Orlie Dakin, MD 07/21/18 1318

## 2018-07-21 NOTE — H&P (Signed)
History and Physical    Sean Cooke YIR:[id number] DOB: 1926/09/04 DOA: 07/21/2018  PCP: [place]   Patient coming from: home   Chief Complaint: Scrotal and pelvic pain.   HPI: Sean Cooke is a 84 y.o. male with medical history significant of recently diagnosed pancreatic adenocarcinoma, history of lung cancer, BPH and chronic anemia.  Patient reports 4 to 5 days of scrotal edema, persistent and worsening, associated with local erythema and tenderness, no improving factors, worse to touch and movement.  Patient was seen in the emergency department 4 days ago, ultrasonography of his scrotum showed no torsion or orchitis, ultrasound lower extremities negative for DVT, he was referred for outpatient neurology evaluation.  At home he continued to have worsening symptoms including worsening lower extremity edema, difficulty ambulating and poor appetite.  He ambulates with the help with a walker, he has chronic increased urinary frequency that has progressed into difficulty passing urine, over the last 24 hours with anuria and pelvic fullness along with pelvic pain  ED Course: Patient was found ill looking appearing, a Foley catheter was placed with greater than 1 liter of urine was obtained. Noted significant erythema in his scrotum, he was diagnosed with cellulitis, received IV antibiotics and urology was consulted.   Review of Systems:  1. General: No fevers, but chills, no weight gain or weight loss 2. ENT: No runny nose or sore throat, no hearing disturbances 3. Pulmonary: No dyspnea, cough, wheezing, or hemoptysis 4. Cardiovascular: No angina, claudication, lower extremity edema, pnd or orthopnea 5. Gastrointestinal: No nausea or vomiting, no diarrhea or constipation 6. Hematology: No easy bruisability or frequent infections 7. Urology: initially increases urine frequency then anuria, with pelvic pain 8. Dermatology: No rashes. 9. Neurology: No seizures  or paresthesias 10. Musculoskeletal: No joint pain or deformities  Past Medical History:  Diagnosis Date  . Anemia   . Arthritis   . Benign localized prostatic hyperplasia with lower urinary tract symptoms (LUTS)   . Bilateral edema of lower extremity   . Borderline diabetes   . Chronic low back pain   . DDD (degenerative disc disease), lumbar   . Degenerative joint disease   . Diverticulosis   . Duodenal diverticulum   . Encounter for antineoplastic chemotherapy 07/2015   lung cancer--- per pt completed chemo 06/ 2018  . Fatigue   . Generalized weakness   . GERD (gastroesophageal reflux disease)   . Hemorrhoids   . Herniated intervertebral disc of lumbar spine    L4-5  . Hiatal hernia   . History of adenomatous polyp of colon   . Hypertension   . Lumbar stenosis    L4-5  . Lung cancer [facility]) oncologist-- dr Julien Nordmann   dx 11/ 2016--- Stage IIIB, (T1a N3 M0)-- non small cell adenocarcinoma of the right middle lobe, started systemic chemothearpy 07-2015 , and per pt completed chemo 06/ 2018  . Pancreatic cancer [facility]) oncologist-  dr Julien Nordmann   new dx 09/ 2019  . Poor dental hygiene   . Wears hearing aid in both ears     Past Surgical History:  Procedure Laterality Date  . CATARACT EXTRACTION W/ INTRAOCULAR LENS  IMPLANT, BILATERAL  2004  . ESOPHAGOGASTRODUODENOSCOPY (EGD) WITH PROPOFOL N/A 07/08/2018   Procedure: ESOPHAGOGASTRODUODENOSCOPY (EGD) WITH PROPOFOL;  Surgeon: Rush Landmark Telford Nab., MD;  Location: [facility];  Service: Gastroenterology;  Laterality: N/A;  . EUS N/A 05/16/2018   Procedure: UPPER ENDOSCOPIC ULTRASOUND (EUS) RADIAL;  Surgeon: Milus Banister,  MD;  Location: [facility];  Service: Endoscopy;  Laterality: N/A;  . EUS N/A 07/08/2018   Procedure: UPPER ENDOSCOPIC ULTRASOUND (EUS) RADIAL;  Surgeon: Rush Landmark Telford Nab., MD;  Location: [facility];  Service: Gastroenterology;  Laterality: N/A;  . FINE NEEDLE ASPIRATION N/A 05/16/2018   Procedure:  FINE NEEDLE ASPIRATION (FNA) LINEAR;  Surgeon: Milus Banister, MD;  Location: [facility];  Service: Endoscopy;  Laterality: N/A;  . INGUINAL HERNIA REPAIR  1980s   unilateral , pt unsure which side  . LAPAROSCOPIC CHOLECYSTECTOMY  07-02-2003   dr gerkin @[facility]   . SPLENECTOMY  10-11-2000   @[facility]    via exploratory laparotomy for blunt abdominal trauma  . TRANSURETHRAL RESECTION OF PROSTATE  1988     reports that he quit smoking about 29 years ago. His smoking use included cigarettes and pipe. He quit after 30.00 years of use. He has never used smokeless tobacco. He reports that he drank alcohol. He reports that he does not use drugs.  Allergies  Allergen Reactions  . Benzonatate Other (See Comments)    D izziness  . Lyrica [Pregabalin] Diarrhea  . Gabapentin Other (See Comments)    Constipation   . Tizanidine Other (See Comments)    Unknown reaction - unable to remember  . Penicillins Rash    Has patient had a PCN reaction causing immediate rash, facial/tongue/throat swelling, SOB or lightheadedness with hypotension: unknown Has patient had a PCN reaction causing severe rash involving mucus membranes or skin necrosis: unknown Has patient had a PCN reaction that required hospitalization : unknown Has patient had a PCN reaction occurring within the last 10 years: no, childhood allergy If all of the above answers are "NO", then may proceed with Cephalosporin use.   . Tramadol Other (See Comments)    Constipation     Family History  Problem Relation Age of Onset  . Diabetes Father   . Colon cancer Neg Hx      Prior to Admission medications   Medication Sig Start Date End Date Taking? Authorizing Provider  amLODipine (NORVASC) 5 MG tablet Take 5 mg by mouth every evening.    Yes [provider]  antiseptic oral rinse (BIOTENE) LIQD 15 mLs by Mouth Rinse route 5 (five) times daily as needed for dry mouth.    Yes [provider]  aspirin 81 MG tablet Take 81 mg  by mouth at bedtime.    Yes [provider]  betamethasone dipropionate (DIPROLENE) 0.05 % cream Apply 1 application topically daily as needed (irritation).    Yes [provider]  bisacodyl (DULCOLAX) 5 MG EC tablet Take 5 mg by mouth daily as needed for moderate constipation.   Yes [provider]  Calcium Carb-Cholecalciferol (CALCIUM-VITAMIN D) 500-200 MG-UNIT tablet Take 1 tablet by mouth 2 (two) times daily.    Yes [provider]  cromolyn (OPTICROM) 4 % ophthalmic solution Place 1 drop into both eyes 4 (four) times daily.  07/25/16  Yes [provider]  denosumab (PROLIA) 60 MG/ML SOLN injection Inject 60 mg into the skin every 6 (six) months. Administer in upper arm, thigh, or abdomen   Yes [provider]  famotidine (PEPCID) 20 MG tablet Take 20 mg by mouth 2 (two) times daily.   Yes [provider]  ferrous sulfate 325 (65 FE) MG tablet Take 325 mg by mouth 2 (two) times daily with a meal.    Yes [provider]  fluconazole (DIFLUCAN) 200 MG tablet Take 1 tablet (  200 mg total) by mouth daily for 1 day, THEN 0.5 tablets (100 mg total) daily for 14 days. 07/10/18 07/25/18 Yes Mansouraty, Telford Nab., MD  guaiFENesin (MUCINEX) 600 MG 12 hr tablet Take 600 mg by mouth every 12 (twelve) hours.    Yes [provider]  HYDROmorphone (DILAUDID) 2 MG tablet Take 1 tablet by mouth every 8 (eight) hours as needed for moderate pain.    Yes [provider]  ibuprofen (ADVIL,MOTRIN) 800 MG tablet Take 800 mg by mouth 3 (three) times daily after meals.    Yes [provider]  Lidocaine, Anorectal, 5 % CREA Apply externally to affected anorectal area up to 6 times/day 07/17/18  Yes Maczis, Barth Kirks, PA-C  lidocaine-prilocaine (EMLA) cream Apply 1 application topically as needed. Patient taking differently: Apply 1 application topically as needed (port access).  09/27/15  Yes Curt Bears, MD  [facility]  [facility] ARTHRITIS PAIN RELIEF EX) Apply 1 patch topically daily as needed (pain).    Yes [provider]  loperamide (IMODIUM) 2 MG capsule Take 2 mg by mouth as needed for diarrhea or loose stools. Reported on 03/01/2016   Yes [provider]  loratadine (CLARITIN) 10 MG tablet Take 10 mg by mouth every morning.    Yes [provider]  losartan (COZAAR) 100 MG tablet Take 100 mg by mouth every morning.  03/29/16  Yes [provider]  Multiple Vitamin (MULTIVITAMIN) tablet Take 1 tablet by mouth daily.   Yes [provider]  Omega-3 Fatty Acids (FISH OIL) 1000 MG CAPS Take 1,000 mg by mouth daily.    Yes [provider]  oxybutynin (DITROPAN) 5 MG tablet Take 5 mg by mouth at bedtime.    Yes [provider]  pantoprazole (PROTONIX) 40 MG tablet Take 40 mg by mouth daily.    Yes [provider]  polyethylene glycol (MIRALAX / GLYCOLAX) packet Take 17 g by mouth daily as needed (constipation.).    Yes [provider]  Probiotic Product (ALIGN) 4 MG CAPS Take 4 mg by mouth daily.    Yes [provider]  saw palmetto 160 MG capsule Take 160 mg by mouth daily.   Yes [provider]  simethicone (MYLICON) 80 MG chewable tablet Chew 80 mg by mouth every 6 (six) hours as needed for flatulence.   Yes [provider]  Tamsulosin HCl (FLOMAX) 0.4 MG CAPS Take 0.4 mg by mouth every morning.    Yes [provider]  Turmeric 500 MG TABS Take 500 mg by mouth daily after supper.    Yes [provider]    Physical Exam: Vitals:   07/21/18 1003  BP: (!) 154/82  Pulse: (!) 103  Resp: 17  Temp: 97.6 F (36.4 C)  SpO2: 98%    Vitals:   07/21/18 1003  BP: (!) 154/82  Pulse: (!) 103  Resp: 17  Temp: 97.6 F (36.4 C)  SpO2: 98%   General: deconditioned and ill looking appearing  Neurology: Awake and alert, non focal Head and Neck. Head normocephalic. Neck supple with no  adenopathy or thyromegaly.   E ENT: positive pallor, no icterus, oral mucosa moist Cardiovascular: No JVD. S1-S2 present, rhythmic, no gallops, rubs, or murmurs. +++ pitting lower extremity edema. Pulmonary: positive breath sounds bilaterally, adequate air movement, no wheezing, rhonchi or rales. Gastrointestinal. Abdomen with no organomegaly, non tender, no rebound or guarding Skin. Positive scrotal erythema and edema, tender to palpation, no ulceration or purulence.  Musculoskeletal:  no joint deformities    Labs on Admission: I have personally reviewed following labs and imaging studies  CBC: Recent Labs  Lab 07/17/18 1243 07/21/18 1228  WBC 21.7* 21.3*  NEUTROABS 16.4* 17.1*  HGB 10.1* 9.6*  HCT 30.6* 29.5*  MCV 94.7 98.3  PLT 533* 824*   Basic Metabolic Panel: Recent Labs  Lab 07/17/18 1243 07/21/18 1228  NA 127* 131*  K 3.0* 3.8  CL 92* 95*  CO2 23 27  GLUCOSE 110* 139*  BUN 18 9  CREATININE 0.72 0.52*  CALCIUM 9.1 9.5   GFR: Estimated Creatinine Clearance: 60.6 mL/min (A) (by C-G formula based on SCr of 0.52 mg/dL (L)). Liver Function Tests: Recent Labs  Lab 07/17/18 1243  AST 43*  ALT 30  ALKPHOS 75  BILITOT 0.5  PROT 6.3*  ALBUMIN 3.2*   Recent Labs  Lab 07/17/18 1243  LIPASE 21   No results for input(s): AMMONIA in the last 168 hours. Coagulation Profile: No results for input(s): INR, PROTIME in the last 168 hours. Cardiac Enzymes: No results for input(s): CKTOTAL, CKMB, CKMBINDEX, TROPONINI in the last 168 hours. BNP (last 3 results) No results for input(s): PROBNP in the last 8760 hours. HbA1C: No results for input(s): HGBA1C in the last 72 hours. CBG: No results for input(s): GLUCAP in the last 168 hours. Lipid Profile: No results for input(s): CHOL, HDL, LDLCALC, TRIG, CHOLHDL, LDLDIRECT in the last 72 hours. Thyroid Function Tests: No results for input(s): TSH, T4TOTAL, FREET4, T3FREE, THYROIDAB in the last 72 hours. Anemia  Panel: No results for input(s): VITAMINB12, FOLATE, FERRITIN, TIBC, IRON, RETICCTPCT in the last 72 hours. Urine analysis:    Component Value Date/Time   COLORURINE STRAW (A) 07/21/2018 1229   APPEARANCEUR CLEAR 07/21/2018 1229   LABSPEC 1.004 (L) 07/21/2018 1229   PHURINE 6.0 07/21/2018 1229   GLUCOSEU NEGATIVE 07/21/2018 1229   HGBUR NEGATIVE 07/21/2018 1229   BILIRUBINUR NEGATIVE 07/21/2018 1229   KETONESUR NEGATIVE 07/21/2018 1229   PROTEINUR NEGATIVE 07/21/2018 1229   UROBILINOGEN 1.0 10/04/2014 2149   NITRITE NEGATIVE 07/21/2018 1229   LEUKOCYTESUR NEGATIVE 07/21/2018 1229    Radiological Exams on Admission: No results found.  EKG: Independently reviewed. NA  Assessment/Plan Active Problems:   GERD   SPONDYLOSIS, LUMBAR   Leukocytosis   Essential hypertension   BPH (benign prostatic hyperplasia)   Primary adenocarcinoma of body of pancreas [facility])   Cellulitis  84 year old male recently diagnosed with pancreatic cancer who presents with 4-day history of scrotal edema, associated with erythema, generalized weakness, lower extremity edema and urinary retention.  On his initial physical examination pressure 97.6, blood pressure 154/82, heart rate 103, respirate 17, oxygen saturation 98%, dry mucous membranes, lungs clear to auscultation bilaterally, heart S1-S2 present and rhythmic, abdomen soft nontender, +3+ pitting bilateral extremity edema bilaterally.  Scrotum with significant edema, erythema, increased local temperature and tenderness to palpation.  Sodium 131, potassium 3.8, chloride 95, bicarb 27, glucose 139, BUN 9, creatinine 0.52, white count 21.3, hemoglobin 9.6, hematocrit 9.5, platelets 450, urine analysis with no signs of infection.   Patient will be admitted to the hospital with the working diagnosis of scrotal cellulitis complicated by acute urinary retention and sepsis.  1.  Sepsis due to scrotal cellulitis, complicated by urinary retention/ BPH.  Patient will  be admitted to the medical ward, he will be placed on a remote telemetry monitor, Foley catheter has been placed obtaining greater than 1 L of urine, continue antibiotic therapy with IV cefepime.  IV fluids with isotonic saline, and as needed analgesics. Follow-up on cultures, cell count and temperature curve.  Urology has been consulted in the emergency department.  Continue tamsulosin  2.  Hypertension.  Continue blood pressure control with amlodipine and losartan.   3.  Pancreatic cancer.  Likely associated with calorie protein malnutrition, consequent lower extremity edema, will ask nutrition consult.  Recent echocardiography from 2019 October showed preserved LV function. Continue hydromorphone for pain control.   4.  Chronic anemia of iron deficiency.  Continue iron supplementation.   DVT prophylaxis: enoxaparin  Code Status: full  Family Communication: I spoke with patient's family at the bedside and all questions were addressed.   Disposition Plan: med-surg  Consults called: urology (ED)  Admission status: Inpatient.     Tzivia Oneil Gerome Apley MD [facility] Pager [phone]  If 7PM-7AM, please contact night-coverage [email or web address] Password [email or web address]  07/21/2018, 1:15 PM  \

## 2018-07-21 NOTE — ED Triage Notes (Signed)
Pt reports that he was seen here couple days ago for constipation and swollen genitals and was told to follow up with one of his doctors but can't get in until Monday (tomorrow). Pt having lots of pain in rectum area. Pt last BM was about hour ago per wife,

## 2018-07-22 DIAGNOSIS — I5033 Acute on chronic diastolic (congestive) heart failure: Secondary | ICD-10-CM

## 2018-07-22 LAB — CBC
HCT: 27.5 % — ABNORMAL LOW (ref 39.0–52.0)
Hemoglobin: 9.1 g/dL — ABNORMAL LOW (ref 13.0–17.0)
MCH: 32.4 pg (ref 26.0–34.0)
MCHC: 33.1 g/dL (ref 30.0–36.0)
MCV: 97.9 fL (ref 80.0–100.0)
Platelets: 424 10*3/uL — ABNORMAL HIGH (ref 150–400)
RBC: 2.81 MIL/uL — ABNORMAL LOW (ref 4.22–5.81)
RDW: 13.7 % (ref 11.5–15.5)
WBC: 16.9 10*3/uL — ABNORMAL HIGH (ref 4.0–10.5)
nRBC: 0 % (ref 0.0–0.2)

## 2018-07-22 LAB — BASIC METABOLIC PANEL
Anion gap: 5 (ref 5–15)
BUN: 6 mg/dL — ABNORMAL LOW (ref 8–23)
CHLORIDE: 100 mmol/L (ref 98–111)
CO2: 30 mmol/L (ref 22–32)
Calcium: 9.3 mg/dL (ref 8.9–10.3)
Creatinine, Ser: 0.6 mg/dL — ABNORMAL LOW (ref 0.61–1.24)
GFR calc Af Amer: >60 mL/min (ref >60)
GFR calc non Af Amer: >60 mL/min (ref >60)
Glucose, Bld: 156 mg/dL — ABNORMAL HIGH (ref 70–99)
POTASSIUM: 4.2 mmol/L (ref 3.5–5.1)
Sodium: 135 mmol/L (ref 135–145)

## 2018-07-22 MED ORDER — BACITRACIN-NEOMYCIN-POLYMYXIN 400-5-5000 EX OINT: 1.0000 "application " | TOPICAL_OINTMENT | Freq: Three times a day (TID) | CUTANEOUS | Status: DC | PRN

## 2018-07-22 MED ORDER — FUROSEMIDE 10 MG/ML IJ SOLN
40.0000 mg | Freq: Two times a day (BID) | INTRAMUSCULAR | Status: AC
  Administered 2018-07-22 (×2): 40 mg via INTRAVENOUS
  Filled 2018-07-22 (×2): qty 4

## 2018-07-22 MED ORDER — DOCUSATE SODIUM 100 MG PO CAPS
100.0000 mg | ORAL_CAPSULE | Freq: Two times a day (BID) | ORAL | Status: DC
  Administered 2018-07-22 – 2018-08-02 (×22): 100 mg via ORAL
  Filled 2018-07-22 (×22): qty 1

## 2018-07-22 MED ORDER — SODIUM CHLORIDE 0.9 % IV SOLN
1.0000 g | INTRAVENOUS | Status: DC
  Administered 2018-07-22 – 2018-07-29 (×8): 1 g via INTRAVENOUS
  Filled 2018-07-22: qty 10
  Filled 2018-07-22 (×8): qty 1

## 2018-07-22 MED ORDER — POTASSIUM CHLORIDE CRYS ER 20 MEQ PO TBCR
40.0000 meq | EXTENDED_RELEASE_TABLET | Freq: Two times a day (BID) | ORAL | Status: AC
  Administered 2018-07-22 (×2): 40 meq via ORAL
  Filled 2018-07-22 (×2): qty 2

## 2018-07-22 NOTE — Care Management Note (Signed)
Case Management Note  Patient Details  Name: SIRCHARLES HOLZHEIMER MRN: [id number] Date of Birth: 09-17-1926  Subjective/Objective:  From home.Scrotal cellulitis.Urine retention. F/c,iv abx. Urology following.                  Action/Plan:dC plan home.   Expected Discharge Date:  07/24/18               Expected Discharge Plan:  Home/Self Care  In-House Referral:     Discharge planning Services  CM Consult  Post Acute Care Choice:    Choice offered to:     DME Arranged:    DME Agency:     HH Arranged:    HH Agency:     Status of Service:  In process, will continue to follow  If discussed at Long Length of Stay Meetings, dates discussed:    Additional Comments:  Dessa Phi, RN 07/22/2018, 1:00 PM

## 2018-07-22 NOTE — Progress Notes (Addendum)
PROGRESS NOTE    Sean Cooke  KWI:[id number] DOB: 07-19-1927 DOA: 07/21/2018 PCP: [place]      Brief Narrative:  Sean Cooke is a 84 y.o. M with MCI, recently diagnosed pancreatic cancer, hx of lung cancer, BPH, and chronic anemia who presents with leg swelling and scrotal swelling, edema, erythema.   Assessment & Plan:  Scrotal cellulitis This is no different -Continue ceftriaxone -Diuresis as below -Consult Urology, appreciate caers    Acute on chronic diastolic CHF Echo in Oct showed normal EF, grade 1 DD. -Furosemide 40 mg IV twice a day today -K supplement -Strict I/Os, daily weight  -Daily monitoring renal function -Strong consideration to stop NSAIDs  BPH -Continue Flomax  Pancreatic cancer Dx/d Sep 2019, not currently on therapy.    Hx stage IIIB NSCLC RML, s/p Chemo 2018  Hypertension -Continue amlodipine, losartan  Other medications -Continue famotidine -Continue oxybutynin  Chronic iron deficiency anemia -Continue iron  Hyponatremia Resolved       MDM and disposition: The below labs and imaging reports were reviewed and summarized above.  Medication management as above.  The patient was admitted with scrotal swelling from infection and edema.  Started on antibiotics and diuresis.         DVT prophylaxis: Lovenox Code Status: FULL Family Communication: None present    Consultants:   Urology  Procedures:   None  Antimicrobials:   Ceftraixone    Subjective: Feeling well.  Still swollen, no significant pain.  No fever, vomiting, confusion.  Not able to exert, so ?dyspnea. Leg swelling no change.   Objective: Vitals:   07/22/18 0619 07/22/18 0726 07/22/18 0947 07/22/18 1532  BP: (!) 168/72  138/68 (!) 148/69  Pulse: 73  80 82  Resp: 18   16  Temp: 98.9 F (37.2 C)   98.5 F (36.9 C)  TempSrc: Oral   Oral  SpO2: 97%   96%  Weight:  70.4 kg    Height:        Intake/Output  Summary (Last 24 hours) at 07/22/2018 1618 Last data filed at 07/22/2018 1518 Gross per 24 hour  Intake 1277.75 ml  Output 6250 ml  Net -4972.25 ml   Filed Weights   07/21/18 1514 07/22/18 0726  Weight: 70 kg 70.4 kg    Examination: General appearance: elderly adult male, alert and in no acute distress.  Sitting in recliner HEENT: Anicteric, conjunctiva pink, lids and lashes normal. No nasal deformity, discharge, epistaxis.  Lips moist.   Skin: Warm and dry.  no jaundice.  No suspicious rashes or lesions.  Mild redness and swelling of scrotum. Cardiac: RRR, nl S1-S2, no murmurs appreciated.  Capillary refill is brisk.  JVP not visible.  1+ LE edema.  Radia  pulses 2+ and symmetric. Respiratory: Normal respiratory rate and rhythm. No wheezing, rales at bases. Abdomen: Abdomen soft.  No TTP. No ascites, distension, hepatosplenomegaly.   MSK: No deformities or effusions. Neuro: Awake and alert.  EOMI, moves all extremities. Speech fluent.    Psych: Sensorium intact and responding to questions, attention normal. Affect normal.  Judgment and insight appear normal.    Data Reviewed: I have personally reviewed following labs and imaging studies:  CBC: Recent Labs  Lab 07/17/18 1243 07/21/18 1228 07/22/18 0420  WBC 21.7* 21.3* 16.9*  NEUTROABS 16.4* 17.1*  --   HGB 10.1* 9.6* 9.1*  HCT 30.6* 29.5* 27.5*  MCV 94.7 98.3 97.9  PLT 533* 450* 242*   Basic Metabolic  Panel: Recent Labs  Lab 07/17/18 1243 07/21/18 1228 07/22/18 0420  NA 127* 131* 135  K 3.0* 3.8 4.2  CL 92* 95* 100  CO2 23 27 30   GLUCOSE 110* 139* 156*  BUN 18 9 6*  CREATININE 0.72 0.52* 0.60*  CALCIUM 9.1 9.5 9.3   GFR: Estimated Creatinine Clearance: 60.6 mL/min (A) (by C-G formula based on SCr of 0.6 mg/dL (L)). Liver Function Tests: Recent Labs  Lab 07/17/18 1243  AST 43*  ALT 30  ALKPHOS 75  BILITOT 0.5  PROT 6.3*  ALBUMIN 3.2*   Recent Labs  Lab 07/17/18 1243  LIPASE 21   No results for  input(s): AMMONIA in the last 168 hours. Coagulation Profile: No results for input(s): INR, PROTIME in the last 168 hours. Cardiac Enzymes: No results for input(s): CKTOTAL, CKMB, CKMBINDEX, TROPONINI in the last 168 hours. BNP (last 3 results) No results for input(s): PROBNP in the last 8760 hours. HbA1C: No results for input(s): HGBA1C in the last 72 hours. CBG: No results for input(s): GLUCAP in the last 168 hours. Lipid Profile: No results for input(s): CHOL, HDL, LDLCALC, TRIG, CHOLHDL, LDLDIRECT in the last 72 hours. Thyroid Function Tests: No results for input(s): TSH, T4TOTAL, FREET4, T3FREE, THYROIDAB in the last 72 hours. Anemia Panel: No results for input(s): VITAMINB12, FOLATE, FERRITIN, TIBC, IRON, RETICCTPCT in the last 72 hours. Urine analysis:    Component Value Date/Time   COLORURINE STRAW (A) 07/21/2018 1229   APPEARANCEUR CLEAR 07/21/2018 1229   LABSPEC 1.004 (L) 07/21/2018 1229   PHURINE 6.0 07/21/2018 1229   GLUCOSEU NEGATIVE 07/21/2018 1229   HGBUR NEGATIVE 07/21/2018 1229   BILIRUBINUR NEGATIVE 07/21/2018 1229   KETONESUR NEGATIVE 07/21/2018 1229   PROTEINUR NEGATIVE 07/21/2018 1229   UROBILINOGEN 1.0 10/04/2014 2149   NITRITE NEGATIVE 07/21/2018 1229   LEUKOCYTESUR NEGATIVE 07/21/2018 1229   Sepsis Labs: @LABRCNTIP (procalcitonin:4,lacticacidven:4)  ) Recent Results (from the past 240 hour(s))  Blood culture (routine x 2)     Status: None (Preliminary result)   Collection Time: 07/21/18  1:39 PM  Result Value Ref Range Status   Specimen Description   Final    BLOOD RIGHT FOREARM Performed at [facility], [place] [address] [place], [place] [address]    Special Requests   Final    BOTTLES DRAWN AEROBIC AND ANAEROBIC Blood Culture results may not be optimal due to an inadequate volume of blood received in culture bottles Performed at [place] [address] [place], [place] [address]    Culture   Final     NO GROWTH < 24 HOURS Performed at [place] [address] [place], [place] [address]    Report Status PENDING  Incomplete  Blood culture (routine x 2)     Status: None (Preliminary result)   Collection Time: 07/21/18  1:39 PM  Result Value Ref Range Status   Specimen Description   Final    LEFT ANTECUBITAL Performed at [place] [address] [place], [place] [address]    Special Requests   Final    BOTTLES DRAWN AEROBIC AND ANAEROBIC Blood Culture adequate volume Performed at [place] [address] [place], [place] [address]    Culture   Final    NO GROWTH < 24 HOURS Performed at [place] [address], [place], [place] [address]    Report Status PENDING  Incomplete  Radiology Studies: No results found.      Scheduled Meds: . acidophilus  1 capsule Oral Daily  . amLODipine  5 mg Oral QPM  . aspirin EC  81 mg Oral QHS  . calcium-vitamin D  1 tablet Oral BID  . cromolyn  1 drop Both Eyes QID  . docusate sodium  100 mg Oral BID  . enoxaparin (LOVENOX) injection  40 mg Subcutaneous Q24H  . famotidine  20 mg Oral BID  . ferrous sulfate  325 mg Oral BID WC  . guaiFENesin  600 mg Oral Q12H  . ibuprofen  800 mg Oral TID WC  . loratadine  10 mg Oral q morning - 10a  . losartan  100 mg Oral q morning - 10a  . multivitamin with minerals  1 tablet Oral Daily  . oxybutynin  5 mg Oral QHS  . pantoprazole  40 mg Oral Daily  . potassium chloride  40 mEq Oral BID  . tamsulosin  0.4 mg Oral q morning - 10a   Continuous Infusions: . cefTRIAXone (ROCEPHIN)  IV 1 g (07/22/18 1137)     LOS: 1 day    Time spent: 25 minutes    Edwin Dada, MD [facility] 07/22/2018, 4:18 PM     Please page through AMION:  [email or web address] Password [email or web address] If 7PM-7AM, please contact night-coverage

## 2018-07-22 NOTE — Progress Notes (Signed)
Patient ID: Sean Cooke, male   DOB: 02-01-1927, 84 y.o.   MRN: [id number]    Assessment: 1.  Scrotal cellulitis - his scrotum appears less erythematous than it did yesterday.  He still has some edema of the skin.  He remains on broad-spectrum antibiotics and I agree continuing this as his white blood cell count is returning to normal.  2.  Urinary retention. - He has a history of BPH with an elevated PVR.  He has been taking tamsulosin.  I am going to increase his dosage to 0.8 mg.  I would recommend leaving the Foley catheter in for now especially while he is being diuresed.  His urinalysis was noted to be clear of any sign of infection.  Plan:  1.  Continue Foley catheter drainage for now. 2.  Neosporin to the Foley catheter/meatal region. 3.  Colace. 4.  Agree with switching him to Rocephin.   Subjective: Patient reports mild discomfort in the scrotal region.  He is tolerating his Foley catheter.  He also reports having some discomfort with bowel movements and having only small round stools.  Objective: Vital signs in last 24 hours: Temp:  [97.5 F (36.4 C)-98.9 F (37.2 C)] 98.9 F (37.2 C) (12/09 0619) Pulse Rate:  [69-103] 73 (12/09 0619) Resp:  [16-18] 18 (12/09 0619) BP: (154-168)/(60-82) 168/72 (12/09 0619) SpO2:  [94 %-98 %] 97 % (12/09 0619) Weight:  [70 kg-70.4 kg] 70.4 kg (12/09 0726)A  Intake/Output from previous day: 12/08 0701 - 12/09 0700 In: 835.2 [P.O.:120; I.V.:543.6; IV Piggyback:171.6] Out: 5400 [Urine:5400] Intake/Output this shift: Total I/O In: 240 [P.O.:240] Out: -   Past Medical History:  Diagnosis Date  . Anemia   . Arthritis   . Benign localized prostatic hyperplasia with lower urinary tract symptoms (LUTS)   . Bilateral edema of lower extremity   . Borderline diabetes   . Chronic low back pain   . DDD (degenerative disc disease), lumbar   . Degenerative joint disease   . Diverticulosis   . Duodenal diverticulum   . Encounter for  antineoplastic chemotherapy 07/2015   lung cancer--- per pt completed chemo 06/ 2018  . Fatigue   . Generalized weakness   . GERD (gastroesophageal reflux disease)   . Hemorrhoids   . Herniated intervertebral disc of lumbar spine    L4-5  . Hiatal hernia   . History of adenomatous polyp of colon   . Hypertension   . Lumbar stenosis    L4-5  . 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 Pancreatic cancer [facility]) oncologist-  dr Julien Nordmann   new dx 09/ 2019  . Poor dental hygiene   . Wears hearing aid in both ears     Physical Exam:  General: Awake, alert and in no apparent distress Lungs: Normal respiratory effort, chest expands symmetrically.  Abdomen: Soft, non-tender & non-distended. GU: Foley catheter indwelling with a slight discharge at the meatus.  His scrotum does not reveal significant erythema now.  He still has scrotal skin edema.  No crepitus.  Lab Results: Recent Labs    07/21/18 1228 07/22/18 0420  WBC 21.3* 16.9*  HGB 9.6* 9.1*  HCT 29.5* 27.5*   BMET Recent Labs    07/21/18 1228 07/22/18 0420  NA 131* 135  K 3.8 4.2  CL 95*  100  CO2 27 30  GLUCOSE 139* 156*  BUN 9 6*  CREATININE 0.52* 0.60*  CALCIUM 9.5 9.3   No results for input(s): LABURIN in the last 72 hours. Results for orders placed or performed in visit on 09/01/15  TECHNOLOGIST REVIEW     Status: None   Collection Time: 09/01/15  9:57 AM  Result Value Ref Range Status   Technologist Review Moderate acanthocytes, few variant lymphs  Final    Studies/Results: No results found.    Abeeha Twist C 07/22/2018, 9:01 AM

## 2018-07-23 DIAGNOSIS — M47817 Spondylosis without myelopathy or radiculopathy, lumbosacral region: Secondary | ICD-10-CM

## 2018-07-23 DIAGNOSIS — D72829 Elevated white blood cell count, unspecified: Secondary | ICD-10-CM

## 2018-07-23 LAB — BASIC METABOLIC PANEL
Anion gap: 8 (ref 5–15)
BUN: 10 mg/dL (ref 8–23)
CO2: 29 mmol/L (ref 22–32)
Calcium: 9.8 mg/dL (ref 8.9–10.3)
Chloride: 94 mmol/L — ABNORMAL LOW (ref 98–111)
Creatinine, Ser: 0.71 mg/dL (ref 0.61–1.24)
GFR calc Af Amer: >60 mL/min (ref >60)
GFR calc non Af Amer: >60 mL/min (ref >60)
Glucose, Bld: 144 mg/dL — ABNORMAL HIGH (ref 70–99)
Potassium: 4.8 mmol/L (ref 3.5–5.1)
Sodium: 131 mmol/L — ABNORMAL LOW (ref 135–145)

## 2018-07-23 LAB — CBC
HEMATOCRIT: 31.5 % — AB (ref 39.0–52.0)
Hemoglobin: 10.4 g/dL — ABNORMAL LOW (ref 13.0–17.0)
MCH: 31.7 pg (ref 26.0–34.0)
MCHC: 33 g/dL (ref 30.0–36.0)
MCV: 96 fL (ref 80.0–100.0)
Platelets: 493 10*3/uL — ABNORMAL HIGH (ref 150–400)
RBC: 3.28 MIL/uL — ABNORMAL LOW (ref 4.22–5.81)
RDW: 13.6 % (ref 11.5–15.5)
WBC: 17.7 10*3/uL — ABNORMAL HIGH (ref 4.0–10.5)
nRBC: 0.1 % (ref 0.0–0.2)

## 2018-07-23 MED ORDER — BISACODYL 10 MG RE SUPP
10.0000 mg | Freq: Once | RECTAL | Status: AC
  Administered 2018-07-23: 10 mg via RECTAL
  Filled 2018-07-23: qty 1

## 2018-07-23 NOTE — Progress Notes (Signed)
PROGRESS NOTE    Sean Cooke  MWN:[id number] DOB: 1926/12/01 DOA: 07/21/2018 PCP: [facility]      Brief Narrative:  Mr. Sean Cooke is a 84 y.o. M with MCI, recently diagnosed pancreatic cancer, hx of lung cancer, BPH, and chronic anemia who presents with leg swelling and scrotal swelling, edema, erythema.   Assessment & Plan:  Scrotal cellulitis Improved, although white blood cell count still 17,000, and he has malaise and weakness. -Continue IV ceftriaxone -Consult Urology, appreciate caers    Acute on chronic diastolic CHF Echo in Oct showed normal EF, grade 1 DD.  BNP elevated, lower extremity edema.  Diuresed 8 L, potassium and creatinine normal. -Hold Lasix -Telemetry monitoring renal function -Strong consideration to stop NSAIDs.   BPH  -Continue Flomax -Voiding trial per urology  Pancreatic cancer Dx/d Sep 2019, not currently on therapy.    Hx stage IIIB NSCLC RML, s/p Chemo 2018  Hypertension Pressure elevated -Continue amlodipine, losartan    Other medications -Continue Pepcid and oxybutynin  Chronic iron deficiency anemia -Continue iron  Hyponatremia Resolved       MDM and disposition: The below labs and imaging reports were reviewed and summarized above.  Medication management as above.  The patient was admitted with lower extremity edema and scrotal swelling, from cellulitis and CHF.  He was diuresed, and swelling is resolved.  However he is still has a significant leukocytosis and malaise, will continue IV antibiotics 24 hours, leukocytosis improving tomorrow, likely home with home health.       DVT prophylaxis: Lovenox Code Status: FULL Family Communication: None present    Consultants:   Urology  Procedures:   None  Antimicrobials:   Ceftraixone    Subjective: Feeling tired and "under the weather".  Swelling resolved.  No scrotal pain.  No fever overnight, no vomiting, confusion, sputum,  dyspnea, cough.  Unable to void.        Objective: Vitals:   07/22/18 1709 07/22/18 2130 07/23/18 0612 07/23/18 1343  BP: (!) 154/71 (!) 158/75 (!) 158/68 (!) 105/92  Pulse:  86 80 (!) 116  Resp:  16 15 18   Temp:  99.6 F (37.6 C) 98 F (36.7 C) 97.9 F (36.6 C)  TempSrc:  Oral Oral Oral  SpO2:  95% 96% 98%  Weight:      Height:        Intake/Output Summary (Last 24 hours) at 07/23/2018 1750 Last data filed at 07/23/2018 1716 Gross per 24 hour  Intake 1520 ml  Output 2225 ml  Net -705 ml   Filed Weights   07/21/18 1514 07/22/18 0726  Weight: 70 kg 70.4 kg    Examination: General appearance: Elderly male, lying in recliner, no acute distress. HEENT: Anicteric, conjunctival pink, lids and lashes normal.  No nasal deformity, discharge, epistaxis.  Lips moist, dentition normal, oropharynx tacky dry, no oral lesions. Skin: Warm and dry, no jaundice, no suspicious rashes or lesions.. Cardiac: RRR, no murmurs, JVP normal, no lower extremity edema.Marland Kitchen Respiratory: Normal respiratory rate and rhythm, lungs clear without rales or wheezes. Abdomen: Abdomen soft without tenderness to palpation, ascites, distention.   GU: Penis and scrotum are normal, no edema. Neuro: Awake and alert, extraocular movements intact, moves all extremities with global weakness, symmetric strength, speech fluent.    Psych: Sensorium intact responding to questions, attention seems normal, affect pleasant, judgment and insight appear moderately impaired.    Data Reviewed: I have personally reviewed following labs and imaging studies:  CBC:  Recent Labs  Lab 07/17/18 1243 07/21/18 1228 07/22/18 0420 07/23/18 0459  WBC 21.7* 21.3* 16.9* 17.7*  NEUTROABS 16.4* 17.1*  --   --   HGB 10.1* 9.6* 9.1* 10.4*  HCT 30.6* 29.5* 27.5* 31.5*  MCV 94.7 98.3 97.9 96.0  PLT 533* 450* 424* 824*   Basic Metabolic Panel: Recent Labs  Lab 07/17/18 1243 07/21/18 1228 07/22/18 0420 07/23/18 0459  NA 127* 131*  135 131*  K 3.0* 3.8 4.2 4.8  CL 92* 95* 100 94*  CO2 23 27 30 29   GLUCOSE 110* 139* 156* 144*  BUN 18 9 6* 10  CREATININE 0.72 0.52* 0.60* 0.71  CALCIUM 9.1 9.5 9.3 9.8   GFR: Estimated Creatinine Clearance: 60.6 mL/min (by C-G formula based on SCr of 0.71 mg/dL). Liver Function Tests: Recent Labs  Lab 07/17/18 1243  AST 43*  ALT 30  ALKPHOS 75  BILITOT 0.5  PROT 6.3*  ALBUMIN 3.2*   Recent Labs  Lab 07/17/18 1243  LIPASE 21   No results for input(s): AMMONIA in the last 168 hours. Coagulation Profile: No results for input(s): INR, PROTIME in the last 168 hours. Cardiac Enzymes: No results for input(s): CKTOTAL, CKMB, CKMBINDEX, TROPONINI in the last 168 hours. BNP (last 3 results) No results for input(s): PROBNP in the last 8760 hours. HbA1C: No results for input(s): HGBA1C in the last 72 hours. CBG: No results for input(s): GLUCAP in the last 168 hours. Lipid Profile: No results for input(s): CHOL, HDL, LDLCALC, TRIG, CHOLHDL, LDLDIRECT in the last 72 hours. Thyroid Function Tests: No results for input(s): TSH, T4TOTAL, FREET4, T3FREE, THYROIDAB in the last 72 hours. Anemia Panel: No results for input(s): VITAMINB12, FOLATE, FERRITIN, TIBC, IRON, RETICCTPCT in the last 72 hours. Urine analysis:    Component Value Date/Time   COLORURINE STRAW (A) 07/21/2018 1229   APPEARANCEUR CLEAR 07/21/2018 1229   LABSPEC 1.004 (L) 07/21/2018 1229   PHURINE 6.0 07/21/2018 1229   GLUCOSEU NEGATIVE 07/21/2018 1229   HGBUR NEGATIVE 07/21/2018 1229   BILIRUBINUR NEGATIVE 07/21/2018 1229   KETONESUR NEGATIVE 07/21/2018 1229   PROTEINUR NEGATIVE 07/21/2018 1229   UROBILINOGEN 1.0 10/04/2014 2149   NITRITE NEGATIVE 07/21/2018 1229   LEUKOCYTESUR NEGATIVE 07/21/2018 1229   Sepsis Labs: @LABRCNTIP (procalcitonin:4,lacticacidven:4)  ) Recent Results (from the past 240 hour(s))  Blood culture (routine x 2)     Status: None (Preliminary result)   Collection Time: 07/21/18   1:39 PM  Result Value Ref Range Status   Specimen Description   Final    BLOOD RIGHT FOREARM Performed at [facility], [place] [address] [place], [place] [address]    Special Requests   Final    BOTTLES DRAWN AEROBIC AND ANAEROBIC Blood Culture results may not be optimal due to an inadequate volume of blood received in culture bottles Performed at [facility] [address] [place], [place] [address]    Culture   Final    NO GROWTH 2 DAYS Performed at [facility] [address] [place], [place] [address]    Report Status PENDING  Incomplete  Blood culture (routine x 2)     Status: None (Preliminary result)   Collection Time: 07/21/18  1:39 PM  Result Value Ref Range Status   Specimen Description   Final    LEFT ANTECUBITAL Performed at [facility] [address], [place],  [address]    Special Requests   Final    BOTTLES DRAWN AEROBIC  AND ANAEROBIC Blood Culture adequate volume Performed at [facility] [address], [place], [place] [address]    Culture   Final    NO GROWTH 2 DAYS Performed at [facility] [address] [place], [place] [address]    Report Status PENDING  Incomplete         Radiology Studies: No results found.      Scheduled Meds: . acidophilus  1 capsule Oral Daily  . amLODipine  5 mg Oral QPM  . aspirin EC  81 mg Oral QHS  . calcium-vitamin D  1 tablet Oral BID  . cromolyn  1 drop Both Eyes QID  . docusate sodium  100 mg Oral BID  . enoxaparin (LOVENOX) injection  40 mg Subcutaneous Q24H  . famotidine  20 mg Oral BID  . ferrous sulfate  325 mg Oral BID WC  . guaiFENesin  600 mg Oral Q12H  . ibuprofen  800 mg Oral TID WC  . loratadine  10 mg Oral q morning - 10a  . losartan  100 mg Oral q morning - 10a  . multivitamin with minerals  1 tablet Oral Daily  . oxybutynin  5 mg Oral QHS  . pantoprazole  40 mg Oral Daily  . tamsulosin   0.4 mg Oral q morning - 10a   Continuous Infusions: . cefTRIAXone (ROCEPHIN)  IV 1 g (07/23/18 1216)     LOS: 2 days    Time spent: 35 minutes    Edwin Dada, MD [facility] 07/23/2018, 5:50 PM     Please page through AMION:  [email or web address] Password [email or web address] If 7PM-7AM, please contact night-coverage

## 2018-07-23 NOTE — Progress Notes (Signed)
Patient has not voided since catheter was removed at 1000. Bladder scan shows 206 in bladder which does not meet criteria for foley insertion. MD notified and said rescan in the morning, pt is probably dry due to 7L that were removed yesterday.

## 2018-07-23 NOTE — Progress Notes (Signed)
Patient attempted to void couple of time but unable to void. Pt said he has not feel like he needs to pee at this movement.  Bladder scan shows 383 ml  in bladder which does not meet criteria for foley insertion. We will scan bladder again in the morning. Will continue to monitor.

## 2018-07-23 NOTE — Progress Notes (Signed)
Patient ID: Sean Cooke, male   DOB: 28-Jan-1927, 84 y.o.   MRN: [id number]    Assessment: 1.  Scrotal cellulitis - cellulitis appears much better.  Blood cultures are negative so far.  I think it would be wise to maintain him on an oral antibiotic with MRSA coverage upon discharge.  2.  BPH with urinary retention - Foley left in place while he diuresed about 8 L over the past 48 hours.  He needs to undergo voiding trial so I will have his catheter removed this morning but I have discussed with him the fact that if he is unable to urinate he will need to be reinserted and removed as an outpatient.  Once he voids and is found to have an ultrasound PVR of 200 or less he could be discharged.   Plan:  1.  Remove Foley catheter for trial of voiding this morning. 2.  I will have him follow-up as an outpatient to monitor his voiding.   Subjective: Patient reports less scrotal discomfort.  Objective: Vital signs in last 24 hours: Temp:  [98 F (36.7 C)-99.6 F (37.6 C)] 98 F (36.7 C) (12/10 0612) Pulse Rate:  [80-86] 80 (12/10 0612) Resp:  [15-16] 15 (12/10 0612) BP: (138-158)/(68-75) 158/68 (12/10 0612) SpO2:  [95 %-96 %] 96 % (12/10 0612) Weight:  [70.4 kg] 70.4 kg (12/09 0726)A  Intake/Output from previous day: 12/09 0701 - 12/10 0700 In: 1115.9 [P.O.:1020; I.V.:95.9] Out: 4575 [Urine:4575] Intake/Output this shift: Total I/O In: 420 [P.O.:420] Out: 600 [Urine:600]  Past Medical History:  Diagnosis Date  . Anemia   . Arthritis   . Benign localized prostatic hyperplasia with lower urinary tract symptoms (LUTS)   . Bilateral edema of lower extremity   . Borderline diabetes   . Chronic low back pain   . DDD (degenerative disc disease), lumbar   . Degenerative joint disease   . Diverticulosis   . Duodenal diverticulum   . Encounter for antineoplastic chemotherapy 07/2015   lung cancer--- per pt completed chemo 06/ 2018  . Fatigue   . Generalized weakness   . GERD  (gastroesophageal reflux disease)   . Hemorrhoids   . Herniated intervertebral disc of lumbar spine    L4-5  . Hiatal hernia   . History of adenomatous polyp of colon   . Hypertension   . Lumbar stenosis    L4-5  . Lung cancer [facility]) oncologist-- dr Julien Nordmann   dx 11/ 2016--- Stage IIIB, (T1a N3 M0)-- non small cell adenocarcinoma of the right middle lobe, started systemic chemothearpy 07-2015 , and per pt completed chemo 06/ 2018  . Pancreatic cancer [facility]) oncologist-  dr Julien Nordmann   new dx 09/ 2019  . Poor dental hygiene   . Wears hearing aid in both ears     Physical Exam:  General: Awake, alert and in no apparent distress Lungs: Normal respiratory effort, chest expands symmetrically.  Abdomen: Soft, non-tender & non-distended. GU: Penis with Foley catheter indwelling draining clear urine.  Scrotum with no erythema or crepitus.  Seems less edematous.  Lab Results: Recent Labs    07/21/18 1228 07/22/18 0420 07/23/18 0459  WBC 21.3* 16.9* 17.7*  HGB 9.6* 9.1* 10.4*  HCT 29.5* 27.5* 31.5*   BMET Recent Labs    07/22/18 0420 07/23/18 0459  NA 135 131*  K 4.2 4.8  CL 100 94*  CO2 30 29  GLUCOSE 156* 144*  BUN 6* 10  CREATININE 0.60* 0.71  CALCIUM 9.3 9.8  No results for input(s): LABURIN in the last 72 hours. Results for orders placed or performed during the hospital encounter of 07/21/18  Blood culture (routine x 2)     Status: None (Preliminary result)   Collection Time: 07/21/18  1:39 PM  Result Value Ref Range Status   Specimen Description   Final    BLOOD RIGHT FOREARM Performed at [facility] [address], [place], [place] [address]    Special Requests   Final    BOTTLES DRAWN AEROBIC AND ANAEROBIC Blood Culture results may not be optimal due to an inadequate volume of blood received in culture bottles Performed at [facility] [address] [place], [place] [address]    Culture   Final    NO GROWTH < 24  HOURS Performed at [facility] [address] [place], [place] [address]    Report Status PENDING  Incomplete  Blood culture (routine x 2)     Status: None (Preliminary result)   Collection Time: 07/21/18  1:39 PM  Result Value Ref Range Status   Specimen Description   Final    LEFT ANTECUBITAL Performed at [facility] [address], [place], [place] [address]    Special Requests   Final    BOTTLES DRAWN AEROBIC AND ANAEROBIC Blood Culture adequate volume Performed at [facility] [address] [place], [place] [address]    Culture   Final    NO GROWTH < 24 HOURS Performed at [facility] [address] [place],  [address]    Report Status PENDING  Incomplete    Studies/Results: No results found.    Kaleiah Kutzer C 07/23/2018, 6:51 AM

## 2018-07-23 NOTE — Evaluation (Signed)
Physical Therapy Evaluation Patient Details Name: Sean Cooke MRN: [id number] DOB: 10-04-1926 Today's Date: 07/23/2018   History of Present Illness  84 Yo male admitted for scrotal cellulitis. H/O lung and pancreatic  cancer, DDD, BPH.  Clinical Impression  The patient  Presents with decreased balance with mobility, improved with use of RW. Patient is vague at times about  His PLOF. Wife not present. Patient may benefit from [place].  Pt admitted with above diagnosis. Pt currently with functional limitations due to the deficits listed below (see PT Problem List). Pt will benefit from skilled PT to increase their independence and safety with mobility to allow discharge to the venue listed below.        Follow Up Recommendations Home health PT    Equipment Recommendations  None recommended by PT    Recommendations for Other Services       Precautions / Restrictions Precautions Precautions: Fall Restrictions Weight Bearing Restrictions: No      Mobility  Bed Mobility Overal bed mobility: Needs Assistance Bed Mobility: Supine to Sit     Supine to sit: Supervision     General bed mobility comments: extra time  Transfers Overall transfer level: Needs assistance Equipment used: Rolling walker (2 wheeled) Transfers: Sit to/from Stand Sit to Stand: Min guard         General transfer comment: cues for safety  Ambulation/Gait Ambulation/Gait assistance: Min assist Gait Distance (Feet): 400 Feet Assistive device: Rolling walker (2 wheeled) Gait Pattern/deviations: Step-through pattern;Drifts right/left;Trunk flexed;Narrow base of support;Festinating Gait velocity: increased with slight festinating   General Gait Details: noted some tremors. did ambuulate in room from to recliner, tended to reach for something to steady without use of RW. Gait  is unsteady with some UE support  Stairs            Wheelchair Mobility    Modified Rankin (Stroke Patients  Only)       Balance Overall balance assessment: Needs assistance Sitting-balance support: No upper extremity supported;Feet supported Sitting balance-Leahy Scale: Good     Standing balance support: During functional activity;No upper extremity supported Standing balance-Leahy Scale: Poor Standing balance comment: relies on UE support                             Pertinent Vitals/Pain Pain Assessment: 0-10 Pain Score: 7  Pain Location: back Pain Descriptors / Indicators: Aching;Discomfort;Grimacing;Guarding Pain Intervention(s): Repositioned;Premedicated before session;Monitored during session    [place] expects to be discharged to:: Private residence Living Arrangements: Spouse/significant other Available Help at Discharge: Family Type of Home: House Home Access: Stairs to enter Entrance Stairs-Rails: Psychiatric nurse of Steps: 6- steep Home Layout: One level        Prior Function Level of Independence: Independent    may have a RW.- pt vague.           Hand Dominance        Extremity/Trunk Assessment   Upper Extremity Assessment Upper Extremity Assessment: Generalized weakness    Lower Extremity Assessment Lower Extremity Assessment: Generalized weakness    Cervical / Trunk Assessment Cervical / Trunk Assessment: Kyphotic  Communication   Communication: HOH  Cognition Arousal/Alertness: Awake/alert Behavior During Therapy: WFL for tasks assessed/performed Overall Cognitive Status: Within Functional Limits for tasks assessed  General Comments: requires some encourgaement, extra time      General Comments      Exercises     Assessment/Plan    PT Assessment Patient needs continued PT services  PT Problem List Decreased strength;Decreased activity tolerance;Decreased balance;Decreased mobility;Decreased knowledge of precautions;Decreased safety  awareness;Decreased knowledge of use of DME       PT Treatment Interventions DME instruction;Gait training;Stair training;Functional mobility training;Therapeutic activities;Therapeutic exercise;Patient/family education    PT Goals (Current goals can be found in the Care Plan section)  Acute Rehab PT Goals Patient Stated Goal: to go home PT Goal Formulation: With patient Time For Goal Achievement: 08/06/18 Potential to Achieve Goals: Good    Frequency Min 3X/week   Barriers to discharge        Co-evaluation               AM-PAC PT "6 Clicks" Mobility  Outcome Measure Help needed turning from your back to your side while in a flat bed without using bedrails?: A Little Help needed moving from lying on your back to sitting on the side of a flat bed without using bedrails?: A Little Help needed moving to and from a bed to a chair (including a wheelchair)?: A Little Help needed standing up from a chair using your arms (e.g., wheelchair or bedside chair)?: A Little Help needed to walk in hospital room?: A Lot Help needed climbing 3-5 steps with a railing? : Total 6 Click Score: 15    End of Session Equipment Utilized During Treatment: Gait belt Activity Tolerance: Patient tolerated treatment well Patient left: in chair;with call bell/phone within reach Nurse Communication: Mobility status PT Visit Diagnosis: Unsteadiness on feet (R26.81);Pain    Time: 1225-8346 PT Time Calculation (min) (ACUTE ONLY): 22 min   Charges:   PT Evaluation $PT Eval Low Complexity: [place] PT Acute Rehabilitation Services Pager [phone] Office [phone]   Claretha Cooper 07/23/2018, 11:37 AM

## 2018-07-24 LAB — CBC
HCT: 29.3 % — ABNORMAL LOW (ref 39.0–52.0)
Hemoglobin: 9.6 g/dL — ABNORMAL LOW (ref 13.0–17.0)
MCH: 31.9 pg (ref 26.0–34.0)
MCHC: 32.8 g/dL (ref 30.0–36.0)
MCV: 97.3 fL (ref 80.0–100.0)
Platelets: 438 10*3/uL — ABNORMAL HIGH (ref 150–400)
RBC: 3.01 MIL/uL — ABNORMAL LOW (ref 4.22–5.81)
RDW: 13.9 % (ref 11.5–15.5)
WBC: 20.2 10*3/uL — ABNORMAL HIGH (ref 4.0–10.5)
nRBC: 0.1 % (ref 0.0–0.2)

## 2018-07-24 LAB — BASIC METABOLIC PANEL
Anion gap: 10 (ref 5–15)
BUN: 18 mg/dL (ref 8–23)
CO2: 25 mmol/L (ref 22–32)
Calcium: 9.8 mg/dL (ref 8.9–10.3)
Chloride: 97 mmol/L — ABNORMAL LOW (ref 98–111)
Creatinine, Ser: 0.7 mg/dL (ref 0.61–1.24)
GFR calc Af Amer: >60 mL/min (ref >60)
GFR calc non Af Amer: >60 mL/min (ref >60)
Glucose, Bld: 166 mg/dL — ABNORMAL HIGH (ref 70–99)
Potassium: 3.9 mmol/L (ref 3.5–5.1)
Sodium: 132 mmol/L — ABNORMAL LOW (ref 135–145)

## 2018-07-24 MED ORDER — BACITRACIN-NEOMYCIN-POLYMYXIN 400-5-5000 EX OINT
TOPICAL_OINTMENT | Freq: Two times a day (BID) | CUTANEOUS | Status: DC
  Administered 2018-07-25 – 2018-08-02 (×15): via TOPICAL

## 2018-07-24 MED ORDER — IBUPROFEN 800 MG PO TABS
800.0000 mg | ORAL_TABLET | Freq: Three times a day (TID) | ORAL | Status: DC | PRN
  Administered 2018-07-24 – 2018-08-02 (×11): 800 mg via ORAL
  Filled 2018-07-24 (×11): qty 1

## 2018-07-24 MED ORDER — NEOMYCIN-POLYMYXIN-PRAMOXINE 1 % EX CREA
TOPICAL_CREAM | Freq: Two times a day (BID) | CUTANEOUS | Status: DC
  Filled 2018-07-24: qty 28

## 2018-07-24 MED ORDER — POLYETHYLENE GLYCOL 3350 17 G PO PACK
17.0000 g | PACK | Freq: Every day | ORAL | Status: DC
  Administered 2018-07-24 – 2018-08-02 (×8): 17 g via ORAL
  Filled 2018-07-24 (×9): qty 1

## 2018-07-24 NOTE — Care Management Important Message (Signed)
Important Message  Patient Details  Name: Sean Cooke MRN: [id number] Date of Birth: 1926-11-05   Medicare Important Message Given:  Yes    Kerin Salen 07/24/2018, 11:40 AMImportant Message  Patient Details  Name: Sean Cooke MRN: [id number] Date of Birth: 18-Sep-1926   Medicare Important Message Given:  Yes    Kerin Salen 07/24/2018, 11:40 AM

## 2018-07-24 NOTE — Progress Notes (Signed)
Bladder scan showed 307 ml at 0533. Notified to oncall urologist Coralie Common again.  MD said if bladder scan shows more than 566ml call her otherwise wait until  Dr Karsten Ro  Come to do his round. Pt said no any discomfort & pt did not feel like he need to void. Will continue to monitor.

## 2018-07-24 NOTE — Progress Notes (Signed)
Pt unable to void . Scanned bladder, pt had 393 mls of urine. Coralie Common was notified, she said to scan patient again in a hour and if greater than 500 mls of urine to call her back. Will scan pt's bladder  in hour & will continue to monitor.

## 2018-07-24 NOTE — Progress Notes (Signed)
PROGRESS NOTE    Sean Cooke  FGH:[id number] DOB: 11-29-1926 DOA: 07/21/2018 PCP: [facility]      Brief Narrative:  Sean Cooke is a 84 y.o. M with MCI, recently diagnosed pancreatic cancer, hx of lung cancer, BPH, and chronic anemia who presents with leg swelling and scrotal swelling, edema, erythema.   Assessment & Plan:  Scrotal cellulitis Improved, although white blood cell count still 17,000, and he has malaise and weakness. -Continue IV ceftriaxone -Consult Urology, appreciate help. 07/24/18: -Scrotal cellulitis improved. -However, still has leukocytosis with WBC count 20,000.  Afebrile. -Ordered labs for a.m.  Continue ceftriaxone.   Acute on chronic diastolic CHF Echo in Oct showed normal EF, grade 1 DD.  BNP elevated, lower extremity edema.  Diuresed 8 L, potassium and creatinine normal. -Hold Lasix -Telemetry monitoring renal function -Strong consideration to stop NSAIDs.   BPH  -Continue Flomax -Voiding trial per urology 07/24/18: -Patient was unable to void after removal of the Foley catheter. - Therefore, foley had to be replaced.  Pancreatic cancer Dx/d Sep 2019, not currently on therapy.    Hx stage IIIB NSCLC RML, s/p Chemo 2018  Hypertension Pressure elevated -Continue amlodipine, losartan  07/24/18: - Continue to monitor BP and adjust meds as needed.  Other medications -Continue Pepcid and oxybutynin  Chronic iron deficiency anemia -Continue iron  Hyponatremia Resolved  Debility - PT consult  Constipation - Will order Miralax     MDM and disposition: The below labs and imaging reports were reviewed and summarized above.  Medication management as above.  The patient was admitted with lower extremity edema and scrotal swelling, from cellulitis and CHF.  He was diuresed, and swelling is resolved.  However he is still has a significant leukocytosis and malaise, will continue IV antibiotics 24 hours,  leukocytosis improving tomorrow, likely home with home health.   DVT prophylaxis: Lovenox Code Status: FULL Family Communication: Discussed with wife and niece at the bedside.   Consultants:   Urology  Procedures:   None  Antimicrobials:   Ceftraixone    Subjective: Swelling resolved.  No scrotal pain.  No fever overnight, no vomiting, confusion, sputum, dyspnea, cough.  Unable to void.  Foley reinserted. C/o constipation.  Objective: Vitals:   07/23/18 1343 07/23/18 2251 07/24/18 0551 07/24/18 1348  BP: (!) 105/92 122/60 (!) 148/67 109/90  Pulse: (!) 116 89 83 75  Resp: 18 20 (!) 22 17  Temp: 97.9 F (36.6 C) 98.6 F (37 C) 98 F (36.7 C) (!) 97.4 F (36.3 C)  TempSrc: Oral Oral Oral Oral  SpO2: 98% 96% 96% 97%  Weight:      Height:        Intake/Output Summary (Last 24 hours) at 07/24/2018 1537 Last data filed at 07/24/2018 1400 Gross per 24 hour  Intake 1555 ml  Output 750 ml  Net 805 ml   Filed Weights   07/21/18 1514 07/22/18 0726  Weight: 70 kg 70.4 kg    Examination: General appearance: Elderly male, lying in recliner, no acute distress. HEENT: Anicteric, conjunctival pink, lids and lashes normal.  No nasal deformity, discharge, epistaxis.  Lips moist, dentition normal, oropharynx tacky dry, no oral lesions. Skin: Warm and dry, no jaundice, no suspicious rashes or lesions.. Cardiac: RRR, no murmurs, JVP normal, no lower extremity edema.Marland Kitchen Respiratory: Normal respiratory rate and rhythm, lungs clear without rales or wheezes. Abdomen: Abdomen soft without tenderness to palpation, ascites, distention.   GU: Penis and scrotum are normal, no  edema. Neuro: Awake and alert, extraocular movements intact, moves all extremities with global weakness, symmetric strength, speech fluent.    Psych: Sensorium intact responding to questions, attention seems normal, affect pleasant, judgment and insight appear intact.   Data Reviewed: I have personally reviewed  following labs and imaging studies:  CBC: Recent Labs  Lab 07/21/18 1228 07/22/18 0420 07/23/18 0459 07/24/18 0450  WBC 21.3* 16.9* 17.7* 20.2*  NEUTROABS 17.1*  --   --   --   HGB 9.6* 9.1* 10.4* 9.6*  HCT 29.5* 27.5* 31.5* 29.3*  MCV 98.3 97.9 96.0 97.3  PLT 450* 424* 493* 497*   Basic Metabolic Panel: Recent Labs  Lab 07/21/18 1228 07/22/18 0420 07/23/18 0459 07/24/18 0450  NA 131* 135 131* 132*  K 3.8 4.2 4.8 3.9  CL 95* 100 94* 97*  CO2 27 30 29 25   GLUCOSE 139* 156* 144* 166*  BUN 9 6* 10 18  CREATININE 0.52* 0.60* 0.71 0.70  CALCIUM 9.5 9.3 9.8 9.8   GFR: Estimated Creatinine Clearance: 60.6 mL/min (by C-G formula based on SCr of 0.7 mg/dL). Liver Function Tests: No results for input(s): AST, ALT, ALKPHOS, BILITOT, PROT, ALBUMIN in the last 168 hours. No results for input(s): LIPASE, AMYLASE in the last 168 hours. No results for input(s): AMMONIA in the last 168 hours. Coagulation Profile: No results for input(s): INR, PROTIME in the last 168 hours. Cardiac Enzymes: No results for input(s): CKTOTAL, CKMB, CKMBINDEX, TROPONINI in the last 168 hours. BNP (last 3 results) No results for input(s): PROBNP in the last 8760 hours. HbA1C: No results for input(s): HGBA1C in the last 72 hours. CBG: No results for input(s): GLUCAP in the last 168 hours. Lipid Profile: No results for input(s): CHOL, HDL, LDLCALC, TRIG, CHOLHDL, LDLDIRECT in the last 72 hours. Thyroid Function Tests: No results for input(s): TSH, T4TOTAL, FREET4, T3FREE, THYROIDAB in the last 72 hours. Anemia Panel: No results for input(s): VITAMINB12, FOLATE, FERRITIN, TIBC, IRON, RETICCTPCT in the last 72 hours. Urine analysis:    Component Value Date/Time   COLORURINE STRAW (A) 07/21/2018 1229   APPEARANCEUR CLEAR 07/21/2018 1229   LABSPEC 1.004 (L) 07/21/2018 1229   PHURINE 6.0 07/21/2018 1229   GLUCOSEU NEGATIVE 07/21/2018 1229   HGBUR NEGATIVE 07/21/2018 1229   BILIRUBINUR NEGATIVE  07/21/2018 1229   KETONESUR NEGATIVE 07/21/2018 1229   PROTEINUR NEGATIVE 07/21/2018 1229   UROBILINOGEN 1.0 10/04/2014 2149   NITRITE NEGATIVE 07/21/2018 1229   LEUKOCYTESUR NEGATIVE 07/21/2018 1229   Sepsis Labs: @LABRCNTIP (procalcitonin:4,lacticacidven:4)  ) Recent Results (from the past 240 hour(s))  Blood culture (routine x 2)     Status: None (Preliminary result)   Collection Time: 07/21/18  1:39 PM  Result Value Ref Range Status   Specimen Description   Final    BLOOD RIGHT FOREARM Performed at [facility], [place] [address] [place], [place] [address]    Special Requests   Final    BOTTLES DRAWN AEROBIC AND ANAEROBIC Blood Culture results may not be optimal due to an inadequate volume of blood received in culture bottles Performed at [facility] [address], [place], [place] [address]    Culture   Final    NO GROWTH 3 DAYS Performed at [facility], [place] [address], [place], [place] [address]    Report Status PENDING  Incomplete  Blood culture (routine x 2)     Status: None (Preliminary result)   Collection Time: 07/21/18  1:39 PM  Result  Value Ref Range Status   Specimen Description   Final    LEFT ANTECUBITAL Performed at [facility] [address] [place], [place] [address]    Special Requests   Final    BOTTLES DRAWN AEROBIC AND ANAEROBIC Blood Culture adequate volume Performed at [facility] [address], [place], [place] [address]    Culture   Final    NO GROWTH 3 DAYS Performed at [facility], [place] [address] [place], [place] [address]    Report Status PENDING  Incomplete         Radiology Studies: No results found.   Scheduled Meds: . acidophilus  1 capsule Oral Daily  . amLODipine  5 mg Oral QPM  . aspirin EC  81 mg Oral QHS  . calcium-vitamin D  1 tablet Oral BID  . cromolyn  1 drop Both Eyes QID  . docusate sodium  100 mg Oral BID  .  enoxaparin (LOVENOX) injection  40 mg Subcutaneous Q24H  . famotidine  20 mg Oral BID  . ferrous sulfate  325 mg Oral BID WC  . guaiFENesin  600 mg Oral Q12H  . ibuprofen  800 mg Oral TID WC  . loratadine  10 mg Oral q morning - 10a  . losartan  100 mg Oral q morning - 10a  . multivitamin with minerals  1 tablet Oral Daily  . oxybutynin  5 mg Oral QHS  . pantoprazole  40 mg Oral Daily  . tamsulosin  0.4 mg Oral q morning - 10a   Continuous Infusions: . cefTRIAXone (ROCEPHIN)  IV Stopped (07/24/18 1309)     LOS: 3 days    Time spent: 35 minutes    Yaakov Guthrie, MD [facility] 07/24/2018, 3:37 PM     Please page through [place]:  [email or web address] Password [email or web address] If 7PM-7AM, please contact night-coverage

## 2018-07-24 NOTE — Progress Notes (Signed)
Patient ID: Sean Cooke, male   DOB: Feb 18, 1927, 84 y.o.   MRN: [id number]    Assessment: 1.  Scrotal cellulitis: His scrotum appears to be free of cellulitis at this time.  His white cell count is up slightly.  2.  Urinary retention: He has not voided but is not uncomfortable.  He said he wanted to try to urinate this morning.  I think waiting until around 10:00 for him to void would be reasonable but if he does not urinate I discussed with him the need to replace the Foley catheter and maintain this upon discharge with a voiding trial in my office.  I'm going to recommend that his tamsulosin be increased to 0.8 mg.   Plan: 1.  Continue voiding trial until 10:00 this morning.  If patient unable to urinate Foley catheter will need to be reinserted. 2.  Increased tamsulosin to 0.8 mg. 3.  Follow-up information/instructions placed on chart.    Subjective: Patient reports his Foley catheter was removed yesterday morning but he has not urinated and he currently denies any suprapubic discomfort said he feels like he might be able to urinate now.  No scrotal discomfort.  Objective: Vital signs in last 24 hours: Temp:  [97.9 F (36.6 C)-98.6 F (37 C)] 98 F (36.7 C) (12/11 0551) Pulse Rate:  [83-116] 83 (12/11 0551) Resp:  [18-22] 22 (12/11 0551) BP: (105-148)/(60-92) 148/67 (12/11 0551) SpO2:  [96 %-98 %] 96 % (12/11 0551)A  Intake/Output from previous day: 12/10 0701 - 12/11 0700 In: 1825 [P.O.:1725; IV Piggyback:100] Out: 0  Intake/Output this shift: Total I/O In: 540 [P.O.:540] Out: 0   Past Medical History:  Diagnosis Date  . Anemia   . Arthritis   . Benign localized prostatic hyperplasia with lower urinary tract symptoms (LUTS)   . Bilateral edema of lower extremity   . Borderline diabetes   . Chronic low back pain   . DDD (degenerative disc disease), lumbar   . Degenerative joint disease   . Diverticulosis   . Duodenal diverticulum   . Encounter for  antineoplastic chemotherapy 07/2015   lung cancer--- per pt completed chemo 06/ 2018  . Fatigue   . Generalized weakness   . GERD (gastroesophageal reflux disease)   . Hemorrhoids   . Herniated intervertebral disc of lumbar spine    L4-5  . Hiatal hernia   . History of adenomatous polyp of colon   . Hypertension   . Lumbar stenosis    L4-5  . 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 Pancreatic cancer [facility]) oncologist-  dr Julien Nordmann   new dx 09/ 2019  . Poor dental hygiene   . Wears hearing aid in both ears     Physical Exam:  General: Awake, alert and in no apparent distress Lungs: Normal respiratory effort, chest expands symmetrically.  Abdomen: Soft, non-tender & non-distended. No suprapubic mass or discomfort to palpation. GU: Scrotum with no sign of cellulitis.  Lab Results: Recent Labs    07/22/18 0420 07/23/18 0459 07/24/18 0450  WBC 16.9* 17.7* 20.2*  HGB 9.1* 10.4* 9.6*  HCT 27.5* 31.5* 29.3*   BMET Recent Labs    07/23/18 0459 07/24/18 0450  NA 131* 132*  K 4.8 3.9  CL 94* 97*  CO2 29 25  GLUCOSE 144* 166*  BUN 10 18  CREATININE 0.71 0.70  CALCIUM 9.8 9.8   No results for input(s): LABURIN in the last 72 hours. Results for orders placed or performed during the hospital encounter of 07/21/18  Blood culture (routine x 2)     Status: None (Preliminary result)   Collection Time: 07/21/18  1:39 PM  Result Value Ref Range Status   Specimen Description   Final    BLOOD RIGHT FOREARM Performed at [facility] [address] [place], [place] [address]    Special Requests   Final    BOTTLES DRAWN AEROBIC AND ANAEROBIC Blood Culture results may not be optimal due to an inadequate volume of blood received in culture bottles Performed at [facility] [address], [place], [place] [address]    Culture   Final    NO GROWTH 2 DAYS Performed at [facility] [address] [place], [place] [address]    Report Status PENDING  Incomplete  Blood culture (routine x 2)     Status: None (Preliminary result)   Collection Time: 07/21/18  1:39 PM  Result Value Ref Range Status   Specimen Description   Final    LEFT ANTECUBITAL Performed at [facility] [address] [place], [place] [address]    Special Requests   Final    BOTTLES DRAWN AEROBIC AND ANAEROBIC Blood Culture adequate volume Performed at [facility] [address] [place], [place] [address]    Culture   Final    NO GROWTH 2 DAYS Performed at [facility] [address] [place], [place] [address]    Report Status PENDING  Incomplete    Studies/Results: No results found.    Phylisha Dix C 07/24/2018, 6:57 AM

## 2018-07-24 NOTE — Care Management Note (Signed)
Case Management Note  Patient Details  Name: BRALEN WILTGEN MRN: [id number] Date of Birth: 1926-10-12  Subjective/Objective: Scrotal cellulitis. From home w/sposue. PT recc HHPT. Spoke to patient/spouse in rm-spouse wants additional help-she had LTC but just stopped payment-will ask CSW about resource if able to use LTC for custodial level help. Informed of max out HHC-HHRN/PT/OT/aide/csw.                    Action/Plan:dc home w/HHC.   Expected Discharge Date:  07/24/18               Expected Discharge Plan:  [place]  In-House Referral:     Discharge planning Services  CM Consult  Post Acute Care Choice:  Durable Medical Equipment(rw) Choice offered to:  Patient  DME Arranged:    DME Agency:     HH Arranged:    [place] Agency:     Status of Service:  In process, will continue to follow  If discussed at Long Length of Stay Meetings, dates discussed:    Additional Comments:  Dessa Phi, RN 07/24/2018, 4:32 PM

## 2018-07-25 LAB — BASIC METABOLIC PANEL
Anion gap: 9 (ref 5–15)
BUN: 12 mg/dL (ref 8–23)
CO2: 26 mmol/L (ref 22–32)
CREATININE: 0.55 mg/dL — AB (ref 0.61–1.24)
Calcium: 9.6 mg/dL (ref 8.9–10.3)
Chloride: 96 mmol/L — ABNORMAL LOW (ref 98–111)
GFR calc Af Amer: >60 mL/min (ref >60)
GFR calc non Af Amer: >60 mL/min (ref >60)
Glucose, Bld: 140 mg/dL — ABNORMAL HIGH (ref 70–99)
Potassium: 4 mmol/L (ref 3.5–5.1)
SODIUM: 131 mmol/L — AB (ref 135–145)

## 2018-07-25 LAB — CBC
HCT: 27.4 % — ABNORMAL LOW (ref 39.0–52.0)
Hemoglobin: 9.1 g/dL — ABNORMAL LOW (ref 13.0–17.0)
MCH: 32.6 pg (ref 26.0–34.0)
MCHC: 33.2 g/dL (ref 30.0–36.0)
MCV: 98.2 fL (ref 80.0–100.0)
Platelets: 428 10*3/uL — ABNORMAL HIGH (ref 150–400)
RBC: 2.79 MIL/uL — ABNORMAL LOW (ref 4.22–5.81)
RDW: 13.8 % (ref 11.5–15.5)
WBC: 19.7 10*3/uL — ABNORMAL HIGH (ref 4.0–10.5)
nRBC: 0.1 % (ref 0.0–0.2)

## 2018-07-25 MED ORDER — ALUM & MAG HYDROXIDE-SIMETH 200-200-20 MG/5ML PO SUSP: 30.0000 mL | ORAL | Status: DC | PRN

## 2018-07-25 MED ORDER — SENNA 8.6 MG PO TABS
2.0000 | ORAL_TABLET | Freq: Once | ORAL | Status: DC
  Filled 2018-07-25: qty 2

## 2018-07-25 NOTE — Progress Notes (Signed)
PROGRESS NOTE    Sean Cooke  GBT:[id number] DOB: 11-09-1926 DOA: 07/21/2018 PCP: [facility]      Brief Narrative:  Mr. Graca is a 84 y.o. M with MCI, recently diagnosed pancreatic cancer, hx of lung cancer, BPH, and chronic anemia who presents with leg swelling and scrotal swelling, edema, erythema.   Assessment & Plan:  Scrotal cellulitis Improved, although white blood cell count still 17,000, and he has malaise and weakness. -Continue IV ceftriaxone -Consult Urology, appreciate help. 07/24/18: -Scrotal cellulitis improved. -However, still has leukocytosis with WBC count 20,000.  Afebrile. -Ordered labs for a.m.  Continue ceftriaxone. 07/25/18: -Scrotal cellulitis improved. -However, WBC count still elevated at 19.7. -Ordered a.m. labs.  Continue to monitor.  Acute on chronic diastolic CHF Echo in Oct showed normal EF, grade 1 DD.  BNP elevated, lower extremity edema.  Diuresed 8 L, potassium and creatinine normal. -Hold Lasix -Telemetry monitoring renal function -Strong consideration to stop NSAIDs.  07/25/18: -Appears stable at this time.  Continue current medications.  BPH  -Continue Flomax -Voiding trial per urology 07/24/18: -Patient was unable to void after removal of the Foley catheter. - Therefore, foley had to be replaced. 07/25/18: -Per urology discharged home with Foley catheter and follow-up outpatient. -Flomax dose increased.  Pancreatic cancer Dx/d Sep 2019, not currently on therapy.   07/25/18: -Follow-up outpatient with oncology.  Hx stage IIIB NSCLC RML, s/p Chemo 2018  Hypertension Pressure elevated -Continue amlodipine, losartan  07/24/18: - Continue to monitor BP and adjust meds as needed. 07/25/18: - BP stable.  Continue current medications.  Continue to monitor blood pressure and adjust medications accordingly.  Other medications -Continue Pepcid and oxybutynin  Chronic iron deficiency  anemia -Continue iron  Hyponatremia Resolved  Debility - PT consulted 07/25/18: - Home health  Constipation - Will order Miralax   07/25/18: -MiraLAX not helping per patient.  Ordered senna.   MDM and disposition: The below labs and imaging reports were reviewed and summarized above.  Medication management as above.  The patient was admitted with lower extremity edema and scrotal swelling, from cellulitis and CHF.  He was diuresed, and swelling is resolved.  However he is still has a significant leukocytosis and malaise, will continue IV antibiotics.  Likely home with home health once symptoms improve.   DVT prophylaxis: Lovenox Code Status: FULL Family Communication: Discussed with wife at the bedside.   Consultants:   Urology  Procedures:   None  Antimicrobials:   Ceftraixone    Subjective: Swelling resolved.  No scrotal pain.  No fever overnight, no vomiting, confusion, sputum, dyspnea, cough.  Foley reinserted. C/o constipation. WBC count still high.  Objective: Vitals:   07/24/18 0551 07/24/18 1348 07/25/18 0119 07/25/18 0538  BP: (!) 148/67 109/90 133/66 (!) 165/71  Pulse: 83 75 74 81  Resp: (!) 22 17 16 16   Temp: 98 F (36.7 C) (!) 97.4 F (36.3 C) 98.5 F (36.9 C) 98 F (36.7 C)  TempSrc: Oral Oral Oral Oral  SpO2: 96% 97% 95% 94%  Weight:    67.1 kg  Height:        Intake/Output Summary (Last 24 hours) at 07/25/2018 1245 Last data filed at 07/25/2018 1204 Gross per 24 hour  Intake 660 ml  Output 1550 ml  Net -890 ml   Filed Weights   07/21/18 1514 07/22/18 0726 07/25/18 0538  Weight: 70 kg 70.4 kg 67.1 kg    Examination: General appearance: Elderly male, lying in bed, no  acute distress. HEENT: Anicteric, conjunctival pink, lids and lashes normal.  No nasal deformity, discharge, epistaxis.  Lips moist, dentition normal, oropharynx tacky dry, no oral lesions. Skin: Warm and dry, no jaundice, no suspicious rashes or lesions.. Cardiac:  RRR, no murmurs, JVP normal, no lower extremity edema.Marland Kitchen Respiratory: Normal respiratory rate and rhythm, lungs clear without rales or wheezes. Abdomen: Abdomen soft without tenderness to palpation, ascites, distention.   GU: Penis and scrotum are normal, no edema. Neuro: Awake and alert, extraocular movements intact, moves all extremities with global weakness, symmetric strength, speech fluent.    Psych: Sensorium intact responding to questions, attention seems normal, affect pleasant, judgment and insight appear intact.   Data Reviewed: I have personally reviewed following labs and imaging studies:  CBC: Recent Labs  Lab 07/21/18 1228 07/22/18 0420 07/23/18 0459 07/24/18 0450 07/25/18 0444  WBC 21.3* 16.9* 17.7* 20.2* 19.7*  NEUTROABS 17.1*  --   --   --   --   HGB 9.6* 9.1* 10.4* 9.6* 9.1*  HCT 29.5* 27.5* 31.5* 29.3* 27.4*  MCV 98.3 97.9 96.0 97.3 98.2  PLT 450* 424* 493* 438* 643*   Basic Metabolic Panel: Recent Labs  Lab 07/21/18 1228 07/22/18 0420 07/23/18 0459 07/24/18 0450 07/25/18 0444  NA 131* 135 131* 132* 131*  K 3.8 4.2 4.8 3.9 4.0  CL 95* 100 94* 97* 96*  CO2 27 30 29 25 26   GLUCOSE 139* 156* 144* 166* 140*  BUN 9 6* 10 18 12   CREATININE 0.52* 0.60* 0.71 0.70 0.55*  CALCIUM 9.5 9.3 9.8 9.8 9.6   GFR: Estimated Creatinine Clearance: 59.4 mL/min (A) (by C-G formula based on SCr of 0.55 mg/dL (L)). Liver Function Tests: No results for input(s): AST, ALT, ALKPHOS, BILITOT, PROT, ALBUMIN in the last 168 hours. No results for input(s): LIPASE, AMYLASE in the last 168 hours. No results for input(s): AMMONIA in the last 168 hours. Coagulation Profile: No results for input(s): INR, PROTIME in the last 168 hours. Cardiac Enzymes: No results for input(s): CKTOTAL, CKMB, CKMBINDEX, TROPONINI in the last 168 hours. BNP (last 3 results) No results for input(s): PROBNP in the last 8760 hours. HbA1C: No results for input(s): HGBA1C in the last 72 hours. CBG: No  results for input(s): GLUCAP in the last 168 hours. Lipid Profile: No results for input(s): CHOL, HDL, LDLCALC, TRIG, CHOLHDL, LDLDIRECT in the last 72 hours. Thyroid Function Tests: No results for input(s): TSH, T4TOTAL, FREET4, T3FREE, THYROIDAB in the last 72 hours. Anemia Panel: No results for input(s): VITAMINB12, FOLATE, FERRITIN, TIBC, IRON, RETICCTPCT in the last 72 hours. Urine analysis:    Component Value Date/Time   COLORURINE STRAW (A) 07/21/2018 1229   APPEARANCEUR CLEAR 07/21/2018 1229   LABSPEC 1.004 (L) 07/21/2018 1229   PHURINE 6.0 07/21/2018 1229   GLUCOSEU NEGATIVE 07/21/2018 1229   HGBUR NEGATIVE 07/21/2018 1229   BILIRUBINUR NEGATIVE 07/21/2018 1229   KETONESUR NEGATIVE 07/21/2018 1229   PROTEINUR NEGATIVE 07/21/2018 1229   UROBILINOGEN 1.0 10/04/2014 2149   NITRITE NEGATIVE 07/21/2018 1229   LEUKOCYTESUR NEGATIVE 07/21/2018 1229   Sepsis Labs: @LABRCNTIP (procalcitonin:4,lacticacidven:4)  ) Recent Results (from the past 240 hour(s))  Blood culture (routine x 2)     Status: None (Preliminary result)   Collection Time: 07/21/18  1:39 PM  Result Value Ref Range Status   Specimen Description   Final    BLOOD RIGHT FOREARM Performed at [facility], [place] [address], [place], [place] [address]    Special Requests  Final    BOTTLES DRAWN AEROBIC AND ANAEROBIC Blood Culture results may not be optimal due to an inadequate volume of blood received in culture bottles Performed at [facility], [place] [address], [place], [place] [address]    Culture   Final    NO GROWTH 3 DAYS Performed at [facility], [place] [address] [place], [place] [address]    Report Status PENDING  Incomplete  Blood culture (routine x 2)     Status: None (Preliminary result)   Collection Time: 07/21/18  1:39 PM  Result Value Ref Range Status   Specimen Description   Final    LEFT ANTECUBITAL Performed at [facility]  [address], [place], [place] [address]    Special Requests   Final    BOTTLES DRAWN AEROBIC AND ANAEROBIC Blood Culture adequate volume Performed at [facility] [address], [place], [place] [address]    Culture   Final    NO GROWTH 3 DAYS Performed at [facility], [place] [address], [place], [place] [address]    Report Status PENDING  Incomplete         Radiology Studies: No results found.   Scheduled Meds: . acidophilus  1 capsule Oral Daily  . amLODipine  5 mg Oral QPM  . aspirin EC  81 mg Oral QHS  . calcium-vitamin D  1 tablet Oral BID  . cromolyn  1 drop Both Eyes QID  . docusate sodium  100 mg Oral BID  . enoxaparin (LOVENOX) injection  40 mg Subcutaneous Q24H  . famotidine  20 mg Oral BID  . ferrous sulfate  325 mg Oral BID WC  . guaiFENesin  600 mg Oral Q12H  . loratadine  10 mg Oral q morning - 10a  . losartan  100 mg Oral q morning - 10a  . multivitamin with minerals  1 tablet Oral Daily  . neomycin-bacitracin-polymyxin   Topical BID  . oxybutynin  5 mg Oral QHS  . pantoprazole  40 mg Oral Daily  . polyethylene glycol  17 g Oral Daily  . senna  2 tablet Oral Once  . tamsulosin  0.4 mg Oral q morning - 10a   Continuous Infusions: . cefTRIAXone (ROCEPHIN)  IV 1 g (07/25/18 1204)     LOS: 4 days    Time spent: 25 minutes    Yaakov Guthrie, MD [facility] 07/25/2018, 12:45 PM     Please page through [facility]:  [email or web address] Password [email or web address] If 7PM-7AM, please contact night-coverage

## 2018-07-25 NOTE — Progress Notes (Signed)
Pt transferred from 5W to 4E Vital signs obtained, assessment complete. Pt stable at this time. Will continue to monitor.

## 2018-07-25 NOTE — Evaluation (Signed)
Occupational Therapy Evaluation Patient Details Name: Sean Cooke MRN: [id number] DOB: [birth year] Today's Date: 07/25/2018    History of Present Illness 84 Yo male admitted for scrotal cellulitis. H/O lung and pancreatic  cancer, DDD, BPH.   Clinical Impression   Pt admitted with above diagnoses, presents with generalized pain and weakness limiting ability to complete BADL at desired level of ind. Pt presents repetitively asking questions and perseverating on topics. Shows difficulty recalling and with STM, making pt relatively a poor historian for home set up/equipment. Pt shares that he is in 6-7/10 pain before mobility, shares it was up to a 10/10 and hurt ""everywhere" when completing functional t/f. Pt with c/o of catheter and back, and stating he feels very weak. Pt requiring min A with bed mobility and min A to stand. Once in standing, pt requesting to immediately return to bed 2/2 pain. Pt unable to ind reach feet for LB dressing. Pt will benefit from acute to OT to address BADL and generalized weakness. Recommend HHOT at this time for d/c to maximize safety and ind in the home.    Follow Up Recommendations  Home health OT    Equipment Recommendations  Other (comment)(pending progress)    Recommendations for Other Services       Precautions / Restrictions Precautions Precautions: Fall Restrictions Weight Bearing Restrictions: No      Mobility Bed Mobility Overal bed mobility: Needs Assistance Bed Mobility: Supine to Sit     Supine to sit: Min assist     General bed mobility comments: pull to sit to support UB  Transfers Overall transfer level: Needs assistance Equipment used: Rolling walker (2 wheeled) Transfers: Sit to/from Stand Sit to Stand: Min assist         General transfer comment: x3 to stand, heavy VC needed for appropriate hand placement    Balance Overall balance assessment: Needs assistance Sitting-balance support: No upper extremity  supported;Feet supported Sitting balance-Leahy Scale: Good     Standing balance support: During functional activity;No upper extremity supported Standing balance-Leahy Scale: Poor Standing balance comment: heavy reliance on UE support                           ADL either performed or assessed with clinical judgement   ADL Overall ADL's : Needs assistance/impaired Eating/Feeding: Set up;Sitting   Grooming: Set up;Sitting   Upper Body Bathing: Moderate assistance;Sitting   Lower Body Bathing: Maximal assistance;Sit to/from stand   Upper Body Dressing : Minimal assistance;Sitting   Lower Body Dressing: Maximal assistance;Sit to/from stand   Toilet Transfer: Moderate assistance;Regular Toilet   Toileting- Clothing Manipulation and Hygiene: Set up;Sit to/from stand;Sitting/lateral lean   Tub/ Shower Transfer: Moderate assistance;Tub bench;Shower seat;Rolling walker   Functional mobility during ADLs: Minimal assistance;Moderate assistance;Rolling walker General ADL Comments: pt presenting with increase in pain with mobility, crying out and deferring functional mobility past bed. Pt consistently sharing that he is not moving well and is needing assist with BADL. At this time pt presents with difficulty in completin ADLs due to generalized weakness and pain     Vision Baseline Vision/History: Wears glasses Wears Glasses: Reading only Patient Visual Report: No change from baseline       Perception     Praxis      Pertinent Vitals/Pain Pain Score: 10-Worst pain ever Pain Location: "everywhere" Pain Descriptors / Indicators: Aching;Discomfort;Grimacing;Guarding Pain Intervention(s): Limited activity within patient's tolerance;Monitored during session;Repositioned     Hand Dominance  Extremity/Trunk Assessment Upper Extremity Assessment Upper Extremity Assessment: Generalized weakness   Lower Extremity Assessment Lower Extremity Assessment: Generalized  weakness       Communication Communication Communication: HOH   Cognition Arousal/Alertness: Awake/alert Behavior During Therapy: WFL for tasks assessed/performed Overall Cognitive Status: No family/caregiver present to determine baseline cognitive functioning                                 General Comments: pt perseverating and asking same questions over and over- seems to be impaired in recall. Need to meet with family member to determine baseline. Poor historian in describing PLOF   General Comments       Exercises     Shoulder Instructions      Home Living Family/patient expects to be discharged to:: Private residence Living Arrangements: Spouse/significant other Available Help at Discharge: Family Type of Home: House Home Access: Stairs to enter Technical brewer of Steps: 6- steep   Home Layout: One level     Bathroom Shower/Tub: Tub/shower unit         Home Equipment: Tub bench   Additional Comments: pt is poor historian, states he has walk in shower, but then mentions "a bench in the tub like they advertise on TV"      Prior Functioning/Environment Level of Independence: Independent                 OT Problem List: Decreased strength;Decreased activity tolerance;Decreased knowledge of use of DME or AE;Decreased cognition;Impaired balance (sitting and/or standing);Decreased safety awareness;Pain      OT Treatment/Interventions: Self-care/ADL training;DME and/or AE instruction;Therapeutic activities;Balance training;Therapeutic exercise;Patient/family education    OT Goals(Current goals can be found in the care plan section) Acute Rehab OT Goals Patient Stated Goal: to go home OT Goal Formulation: With patient Time For Goal Achievement: 08/08/18 Potential to Achieve Goals: Good  OT Frequency: Min 2X/week   Barriers to D/C:            Co-evaluation              AM-PAC OT "6 Clicks" Daily Activity     Outcome  Measure Help from another person eating meals?: None Help from another person taking care of personal grooming?: A Little Help from another person toileting, which includes using toliet, bedpan, or urinal?: A Lot Help from another person bathing (including washing, rinsing, drying)?: A Lot Help from another person to put on and taking off regular upper body clothing?: A Little Help from another person to put on and taking off regular lower body clothing?: A Lot 6 Click Score: 16   End of Session Equipment Utilized During Treatment: Gait belt;Rolling walker Nurse Communication: Mobility status  Activity Tolerance: Patient limited by pain Patient left: in bed;with call bell/phone within reach;with nursing/sitter in room  OT Visit Diagnosis: Other abnormalities of gait and mobility (R26.89);Muscle weakness (generalized) (M62.81);Pain Pain - part of body: (generalized)                Time: 1884-1660 OT Time Calculation (min): 26 min Charges:  OT General Charges $OT Visit: 1 Visit OT Evaluation $OT Eval Moderate Complexity: 1 Mod OT Treatments $Self Care/Home Management : 8-22 mins  Zenovia Jarred, MSOT, OTR/L Behavioral Health OT/ Acute Relief OT [facility] Office: [phone]  Zenovia Jarred 07/25/2018, 5:34 PM

## 2018-07-25 NOTE — Care Management Note (Signed)
Case Management Note  Patient Details  Name: Sean Cooke MRN: [id number] Date of Birth: 1926/12/28  Subjective/Objective: Patient/spouse(spoke on [phone]) chose [facility] for HHC-rep Karen aware-HHRN-f/c,PT/OT/aide/csw. PTAR forms in shadow chart if needed.                   Action/Plan:d/c home w/HHC.   Expected Discharge Date:  07/24/18               Expected Discharge Plan:  [place]  In-House Referral:     Discharge planning Services  CM Consult  Post Acute Care Choice:  Durable Medical Equipment(rw) Choice offered to:  Patient  DME Arranged:    DME Agency:     HH Arranged:  RN, PT, OT, Nurse's Aide, Social Work [employer] Agency:  [facility]  Status of Service:  Completed, signed off  If discussed at [employer], dates discussed:    Additional Comments:  Dessa Phi, RN 07/25/2018, 11:24 AM

## 2018-07-25 NOTE — Progress Notes (Signed)
Pt to be transferred to 1408. Pt in agreement. Report called to [place]

## 2018-07-25 NOTE — Progress Notes (Signed)
Patient ID: Sean Cooke, male   DOB: [birth year], 84 y.o.   MRN: [id number]    Assessment: 1.  Scrotal cellulitis - appears resolved.  2.  Urinary retention - patient unable to void.  Foley catheter replaced.  Will need to be discharged with Foley.  Tamsulosin increased to 0.8 mg.   Plan:  1.  New prescription for tamsulosin 0.8 mg has been sent to his pharmacy electronically. 2.  Discharge home with Foley catheter. 3.  Patient will follow up with me as an outpatient for repeat voiding trial.   Subjective: Patient reports no new complaints.  He was unable to void yesterday and required reinsertion of a Foley catheter.  Objective: Vital signs in last 24 hours: Temp:  [97.4 F (36.3 C)-98.5 F (36.9 C)] 98 F (36.7 C) (12/12 0538) Pulse Rate:  [74-81] 81 (12/12 0538) Resp:  [16-17] 16 (12/12 0538) BP: (109-165)/(66-90) 165/71 (12/12 0538) SpO2:  [94 %-97 %] 94 % (12/12 0538) Weight:  [67.1 kg] 67.1 kg (12/12 0538)A  Intake/Output from previous day: 12/11 0701 - 12/12 0700 In: 61 [P.O.:440; IV Piggyback:100] Out: 1525 [Urine:1525] Intake/Output this shift: Total I/O In: 110 [P.O.:110] Out: 600 [Urine:600]  Past Medical History:  Diagnosis Date  . Anemia   . Arthritis   . Benign localized prostatic hyperplasia with lower urinary tract symptoms (LUTS)   . Bilateral edema of lower extremity   . Borderline diabetes   . Chronic low back pain   . DDD (degenerative disc disease), lumbar   . Degenerative joint disease   . Diverticulosis   . Duodenal diverticulum   . Encounter for antineoplastic chemotherapy 07/2015   lung cancer--- per pt completed chemo 06/ 2018  . Fatigue   . Generalized weakness   . GERD (gastroesophageal reflux disease)   . Hemorrhoids   . Herniated intervertebral disc of lumbar spine    L4-5  . Hiatal hernia   . History of adenomatous polyp of colon   . Hypertension   . Lumbar stenosis    L4-5  . 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 Pancreatic cancer [facility]) oncologist-  dr Julien Nordmann   new dx 09/ 2019  . Poor dental hygiene   . Wears hearing aid in both ears     Physical Exam:  General: Awake, alert and in no apparent distress Lungs: Normal respiratory effort, chest expands symmetrically.  Abdomen: Soft, non-tender & non-distended. GU: Foley catheter in place and draining clear urine. Scrotum without erythema or signs of infection at this time.  Lab Results: Recent Labs    07/23/18 0459 07/24/18 0450 07/25/18 0444  WBC 17.7* 20.2* 19.7*  HGB 10.4* 9.6* 9.1*  HCT 31.5* 29.3* 27.4*   BMET Recent Labs    07/24/18 0450 07/25/18 0444  NA 132* 131*  K 3.9 4.0  CL 97* 96*  CO2 25 26  GLUCOSE 166* 140*  BUN 18 12  CREATININE 0.70 0.55*  CALCIUM 9.8 9.6   No results for input(s): LABURIN in the last 72 hours. Results for orders placed or performed during the hospital encounter of 07/21/18  Blood culture (routine x 2)     Status: None (Preliminary result)   Collection Time: 07/21/18  1:39 PM  Result Value Ref Range Status   Specimen Description   Final  BLOOD RIGHT FOREARM Performed at [facility] [address] [place], [place] [address]    Special Requests   Final    BOTTLES DRAWN AEROBIC AND ANAEROBIC Blood Culture results may not be optimal due to an inadequate volume of blood received in culture bottles Performed at [facility] [address], [place], [place] [address]    Culture   Final    NO GROWTH 3 DAYS Performed at [facility], [place] [address] [place], [place] [address]    Report Status PENDING  Incomplete  Blood culture (routine x 2)     Status: None (Preliminary result)   Collection Time: 07/21/18  1:39 PM  Result Value Ref Range Status   Specimen Description    Final    LEFT ANTECUBITAL Performed at [facility] [address], [place], [place] [address]    Special Requests   Final    BOTTLES DRAWN AEROBIC AND ANAEROBIC Blood Culture adequate volume Performed at [facility] [address], [place], [place] [address]    Culture   Final    NO GROWTH 3 DAYS Performed at [facility], [place] [address], [place], [place] [address]    Report Status PENDING  Incomplete    Studies/Results: No results found.    Claybon Jabs 07/25/2018, 6:48 AM

## 2018-07-26 ENCOUNTER — Ambulatory Visit: Payer: PPO

## 2018-07-26 ENCOUNTER — Ambulatory Visit
Admission: RE | Admit: 2018-07-26 | Discharge: 2018-07-26 | Disposition: A | Discharge status: Home / Self Care | Payer: PPO | Source: Ambulatory Visit | Attending: Radiation Oncology | Admitting: Radiation Oncology

## 2018-07-26 DIAGNOSIS — C251 Malignant neoplasm of body of pancreas: Secondary | ICD-10-CM

## 2018-07-26 LAB — CBC
HCT: 27.6 % — ABNORMAL LOW (ref 39.0–52.0)
HEMOGLOBIN: 9.4 g/dL — AB (ref 13.0–17.0)
MCH: 31.4 pg (ref 26.0–34.0)
MCHC: 34.1 g/dL (ref 30.0–36.0)
MCV: 92.3 fL (ref 80.0–100.0)
Platelets: 470 10*3/uL — ABNORMAL HIGH (ref 150–400)
RBC: 2.99 MIL/uL — ABNORMAL LOW (ref 4.22–5.81)
RDW: 13.2 % (ref 11.5–15.5)
WBC: 16.6 10*3/uL — ABNORMAL HIGH (ref 4.0–10.5)
nRBC: 0 % (ref 0.0–0.2)

## 2018-07-26 LAB — CULTURE, BLOOD (ROUTINE X 2)
Culture: NO GROWTH
Culture: NO GROWTH
Special Requests: ADEQUATE

## 2018-07-26 LAB — BASIC METABOLIC PANEL
Anion gap: 8 (ref 5–15)
BUN: 12 mg/dL (ref 8–23)
CO2: 25 mmol/L (ref 22–32)
Calcium: 9.5 mg/dL (ref 8.9–10.3)
Chloride: 97 mmol/L — ABNORMAL LOW (ref 98–111)
Creatinine, Ser: 0.55 mg/dL — ABNORMAL LOW (ref 0.61–1.24)
GFR calc Af Amer: >60 mL/min (ref >60)
GFR calc non Af Amer: >60 mL/min (ref >60)
Glucose, Bld: 161 mg/dL — ABNORMAL HIGH (ref 70–99)
POTASSIUM: 4.3 mmol/L (ref 3.5–5.1)
Sodium: 130 mmol/L — ABNORMAL LOW (ref 135–145)

## 2018-07-26 MED ORDER — TAMSULOSIN HCL 0.4 MG PO CAPS
0.8000 mg | ORAL_CAPSULE | Freq: Every morning | ORAL | Status: DC
  Administered 2018-07-27 – 2018-08-02 (×7): 0.8 mg via ORAL
  Filled 2018-07-26 (×8): qty 2

## 2018-07-26 MED ORDER — LACTULOSE 10 GM/15ML PO SOLN
10.0000 g | Freq: Once | ORAL | Status: AC
  Administered 2018-07-26: 10 g via ORAL
  Filled 2018-07-26: qty 15

## 2018-07-26 MED ORDER — FUROSEMIDE 10 MG/ML IJ SOLN
20.0000 mg | Freq: Once | INTRAMUSCULAR | Status: AC
  Administered 2018-07-26: 20 mg via INTRAVENOUS
  Filled 2018-07-26: qty 2

## 2018-07-26 MED ORDER — TRAZODONE HCL 50 MG PO TABS
50.0000 mg | ORAL_TABLET | Freq: Once | ORAL | Status: AC
  Administered 2018-07-26: 50 mg via ORAL
  Filled 2018-07-26: qty 1

## 2018-07-26 NOTE — Progress Notes (Signed)
Physical Therapy Treatment Patient Details Name: Sean Cooke MRN: [id number] DOB: 11-Jun-1927 Today's Date: 07/26/2018    History of Present Illness 84 Yo male admitted for scrotal cellulitis. H/O lung and pancreatic  cancer, DDD, BPH.    PT Comments    Patient with limited activity tolerance compared to last session and requiring consistently min A for mobility.  Wife reports she is unable to assist and feels he can use skilled rehab prior to return home.  Recommend STSNF level rehab to progress mobiitiy and independence prior to return home.   Follow Up Recommendations  SNF;Supervision/Assistance - 24 hour     Equipment Recommendations  None recommended by PT    Recommendations for Other Services       Precautions / Restrictions Precautions Precautions: Fall    Mobility  Bed Mobility Overal bed mobility: Needs Assistance Bed Mobility: Sit to Supine     Supine to sit: Min assist;HOB elevated Sit to supine: Min assist   General bed mobility comments: cues for technique from sidelying due to back pain, assist for trunk; to supine assist for legs  Transfers Overall transfer level: Needs assistance Equipment used: Rolling walker (2 wheeled) Transfers: Sit to/from Stand Sit to Stand: Min assist         General transfer comment: cues for safety, assist for balance/steady  Ambulation/Gait Ambulation/Gait assistance: Min assist Gait Distance (Feet): 70 Feet Assistive device: Rolling walker (2 wheeled) Gait Pattern/deviations: Step-through pattern;Step-to pattern;Decreased stride length;Antalgic     General Gait Details: pain both feet with ambulation, R worse than left and antalgic gait noted, general weakness, imbalance despite assist   Stairs             Wheelchair Mobility    Modified Rankin (Stroke Patients Only)       Balance Overall balance assessment: Needs assistance Sitting-balance support: Feet supported Sitting balance-Leahy  Scale: Good     Standing balance support: Bilateral upper extremity supported Standing balance-Leahy Scale: Poor Standing balance comment: heavy reliance on UE support                            Cognition Arousal/Alertness: Awake/alert Behavior During Therapy: Anxious Overall Cognitive Status: History of cognitive impairments - at baseline                                        Exercises      General Comments General comments (skin integrity, edema, etc.): wife in room and reports prefers for pt to go to rehab as she cannot handle level of assist at home. Attempted to position in chair, but pt could not sit comfortably reclined or upright due to scrotal pain and scooting down in chair so returned to bed for safety/comfort      Pertinent Vitals/Pain Pain Assessment: Faces Faces Pain Scale: Hurts whole lot Pain Location: feet and back and scrotum when sitting Pain Descriptors / Indicators: Aching;Discomfort;Grimacing;Guarding Pain Intervention(s): Monitored during session;Limited activity within patient's tolerance;Repositioned    Home Living                      Prior Function            PT Goals (current goals can now be found in the care plan section) Progress towards PT goals: Not progressing toward goals - comment(due to pain)  Frequency    Min 3X/week      PT Plan Discharge plan needs to be updated    Co-evaluation              AM-PAC PT "6 Clicks" Mobility   Outcome Measure  Help needed turning from your back to your side while in a flat bed without using bedrails?: A Lot Help needed moving from lying on your back to sitting on the side of a flat bed without using bedrails?: A Little Help needed moving to and from a bed to a chair (including a wheelchair)?: A Little Help needed standing up from a chair using your arms (e.g., wheelchair or bedside chair)?: A Little Help needed to walk in hospital room?: A  Little Help needed climbing 3-5 steps with a railing? : Total 6 Click Score: 15    End of Session Equipment Utilized During Treatment: Gait belt Activity Tolerance: Patient limited by pain Patient left: with call bell/phone within reach;in bed;with family/visitor present;with bed alarm set   PT Visit Diagnosis: Other abnormalities of gait and mobility (R26.89);Muscle weakness (generalized) (M62.81)     Time: 4944-9675 PT Time Calculation (min) (ACUTE ONLY): 24 min  Charges:  $Gait Training: 8-22 mins $Therapeutic Activity: 8-22 mins                     Magda Kiel, [place] [phone] 07/26/2018    Reginia Naas 07/26/2018, 12:29 PM

## 2018-07-26 NOTE — Plan of Care (Signed)

## 2018-07-26 NOTE — Progress Notes (Signed)
PROGRESS NOTE    Sean Cooke  EAV:[id number] DOB: 02/21/1927 DOA: 07/21/2018 PCP: [facility]      Brief Narrative:  Sean Cooke is a 84 y.o. M with MCI, recently diagnosed pancreatic cancer, hx of lung cancer, BPH, and chronic anemia who presents with leg swelling and scrotal swelling, edema, erythema. Scrotal cellulitis improved with antibiotics.  WBC count improving.  Patient to have simulation for radiation planning on 07/26/2018.  Patient's wife wanting him to be placed in a facility as she is unable to care for him at home.  Social work, PT consulted.   Assessment & Plan:  Scrotal cellulitis Improved, although white blood cell count still 17,000, and he has malaise and weakness. -Continue IV ceftriaxone -Consult Urology, appreciate help. 07/24/18: -Scrotal cellulitis improved. -However, still has leukocytosis with WBC count 20,000.  Afebrile. -Ordered labs for a.m.  Continue ceftriaxone. 07/25/18: -Scrotal cellulitis improved. -However, WBC count still elevated at 19.7. -Ordered a.m. labs.  Continue to monitor. 07/26/18: -Scrotal cellulitis improved on ceftriaxone. -WBC count trending down.  Continue to monitor.  A.m. labs ordered.   Acute on chronic diastolic CHF Echo in Oct showed normal EF, grade 1 DD.  BNP elevated, lower extremity edema.  Diuresed 8 L, potassium and creatinine normal. -Hold Lasix -Telemetry monitoring renal function -Strong consideration to stop NSAIDs.  07/25/18: -Appears stable at this time.  Continue current medications. 07/26/18: -His sodium dropped.  Will give 1 dose of Lasix.  Otherwise stable.  Continue current medications.  BPH  -Continue Flomax -Voiding trial per urology 07/24/18: -Patient was unable to void after removal of the Foley catheter. - Therefore, foley had to be replaced. 07/25/18: -Per urology discharged home with Foley catheter and follow-up outpatient. -Flomax dose  increased. 07/26/18: -Patient to be discharged home with Foley catheter per urology.  Continue Flomax.  Pancreatic cancer Dx/d Sep 2019, not currently on therapy.   07/25/18: -Follow-up outpatient with oncology. 07/26/18: -He will have simulation today to plan for radiation.  Hx stage IIIB NSCLC RML, s/p Chemo 2018  Hypertension Pressure elevated -Continue amlodipine, losartan  07/24/18: - Continue to monitor BP and adjust meds as needed. 07/25/18: - BP stable.  Continue current medications.  Continue to monitor blood pressure and adjust medications accordingly. 07/26/18: -Blood pressure stable for his age. -Continue current medications.  Other medications -Continue Pepcid and oxybutynin  Chronic iron deficiency anemia -Continue iron  Hyponatremia Resolved 07/26/18: -On further lab review patient has chronically low sodium and hyponatremia at times. -Ordered a.m. labs. -He has acute on chronic diastolic heart failure, therefore reluctant to give fluids.  If anything he may need Lasix. -Ordered 1 dose of Lasix.  Continue to monitor sodium.  Debility - PT consulted 07/25/18: - Home health 07/26/18: -Initially PT recommended home health.  However, patient's wife wants him to be at a facility because she is unable to care for him at home.  PT to reevaluate.  Social work consulted.  Constipation - Will order Miralax   07/25/18: -MiraLAX not helping per patient.  Ordered senna. 07/26/18: -He was given MiraLAX, senna.  Both of them not helping and the patient continues to complain of constipation.  Will order a dose of lactulose after his simulation session.   MDM and disposition: The below labs and imaging reports were reviewed and summarized above.  Medication management as above.  The patient was admitted with lower extremity edema and scrotal swelling, from cellulitis and CHF.  He was diuresed, and swelling is  resolved.  However he is still has a significant  leukocytosis and malaise, will continue IV antibiotics.  Likely home with home health once symptoms improve.   DVT prophylaxis: Lovenox Code Status: FULL Family Communication: Discussed at length with wife at the bedside.   Consultants:   Urology  Procedures:   None  Antimicrobials:   Ceftraixone    Subjective: Swelling resolved.  No scrotal pain.  No fever overnight, no vomiting, confusion, sputum, dyspnea, cough.  Foley reinserted. Continues to c/o constipation. WBC count still high but improving.  Objective: Vitals:   07/25/18 1401 07/25/18 1711 07/25/18 2140 07/26/18 0441  BP: 139/62 (!) 149/63 (!) 146/65 (!) 155/63  Pulse: 80 82 82 70  Resp: 16 16 19 18   Temp: (!) 97.4 F (36.3 C) 98.4 F (36.9 C) 99.9 F (37.7 C) 98 F (36.7 C)  TempSrc: Oral Oral Oral   SpO2: 95% 100% 94% 90%  Weight:      Height:        Intake/Output Summary (Last 24 hours) at 07/26/2018 1049 Last data filed at 07/26/2018 0925 Gross per 24 hour  Intake 360 ml  Output 700 ml  Net -340 ml   Filed Weights   07/21/18 1514 07/22/18 0726 07/25/18 0538  Weight: 70 kg 70.4 kg 67.1 kg    Examination: General appearance: Elderly male, lying in bed, no acute distress. HEENT: Anicteric, conjunctival pink, lids and lashes normal.  No nasal deformity, discharge, epistaxis.  Lips moist, dentition normal, oropharynx tacky dry, no oral lesions. Skin: Warm and dry, no jaundice, no suspicious rashes or lesions.. Cardiac: RRR, no murmurs, JVP normal, no lower extremity edema.Marland Kitchen Respiratory: Normal respiratory rate and rhythm, lungs clear without rales or wheezes. Abdomen: Abdomen soft without tenderness to palpation, ascites, distention.   GU: Penis and scrotum are normal, no edema. Neuro: Awake and alert, extraocular movements intact, moves all extremities with global weakness, symmetric strength, speech fluent.    Psych: Sensorium intact responding to questions, attention seems normal, affect  pleasant, judgment and insight appear intact.   Data Reviewed: I have personally reviewed following labs and imaging studies:  CBC: Recent Labs  Lab 07/21/18 1228 07/22/18 0420 07/23/18 0459 07/24/18 0450 07/25/18 0444 07/26/18 0537  WBC 21.3* 16.9* 17.7* 20.2* 19.7* 16.6*  NEUTROABS 17.1*  --   --   --   --   --   HGB 9.6* 9.1* 10.4* 9.6* 9.1* 9.4*  HCT 29.5* 27.5* 31.5* 29.3* 27.4* 27.6*  MCV 98.3 97.9 96.0 97.3 98.2 92.3  PLT 450* 424* 493* 438* 428* 947*   Basic Metabolic Panel: Recent Labs  Lab 07/22/18 0420 07/23/18 0459 07/24/18 0450 07/25/18 0444 07/26/18 0537  NA 135 131* 132* 131* 130*  K 4.2 4.8 3.9 4.0 4.3  CL 100 94* 97* 96* 97*  CO2 30 29 25 26 25   GLUCOSE 156* 144* 166* 140* 161*  BUN 6* 10 18 12 12   CREATININE 0.60* 0.71 0.70 0.55* 0.55*  CALCIUM 9.3 9.8 9.8 9.6 9.5   GFR: Estimated Creatinine Clearance: 59.4 mL/min (A) (by C-G formula based on SCr of 0.55 mg/dL (L)). Liver Function Tests: No results for input(s): AST, ALT, ALKPHOS, BILITOT, PROT, ALBUMIN in the last 168 hours. No results for input(s): LIPASE, AMYLASE in the last 168 hours. No results for input(s): AMMONIA in the last 168 hours. Coagulation Profile: No results for input(s): INR, PROTIME in the last 168 hours. Cardiac Enzymes: No results for input(s): CKTOTAL, CKMB, CKMBINDEX, TROPONINI in the last 168  hours. BNP (last 3 results) No results for input(s): PROBNP in the last 8760 hours. HbA1C: No results for input(s): HGBA1C in the last 72 hours. CBG: No results for input(s): GLUCAP in the last 168 hours. Lipid Profile: No results for input(s): CHOL, HDL, LDLCALC, TRIG, CHOLHDL, LDLDIRECT in the last 72 hours. Thyroid Function Tests: No results for input(s): TSH, T4TOTAL, FREET4, T3FREE, THYROIDAB in the last 72 hours. Anemia Panel: No results for input(s): VITAMINB12, FOLATE, FERRITIN, TIBC, IRON, RETICCTPCT in the last 72 hours. Urine analysis:    Component Value Date/Time    COLORURINE STRAW (A) 07/21/2018 1229   APPEARANCEUR CLEAR 07/21/2018 1229   LABSPEC 1.004 (L) 07/21/2018 1229   PHURINE 6.0 07/21/2018 1229   GLUCOSEU NEGATIVE 07/21/2018 1229   HGBUR NEGATIVE 07/21/2018 1229   BILIRUBINUR NEGATIVE 07/21/2018 1229   KETONESUR NEGATIVE 07/21/2018 1229   PROTEINUR NEGATIVE 07/21/2018 1229   UROBILINOGEN 1.0 10/04/2014 2149   NITRITE NEGATIVE 07/21/2018 1229   LEUKOCYTESUR NEGATIVE 07/21/2018 1229   Sepsis Labs: @LABRCNTIP (procalcitonin:4,lacticacidven:4)  ) Recent Results (from the past 240 hour(s))  Blood culture (routine x 2)     Status: None   Collection Time: 07/21/18  1:39 PM  Result Value Ref Range Status   Specimen Description   Final    BLOOD RIGHT FOREARM Performed at [facility], [place] [address], [place], [place] [address]    Special Requests   Final    BOTTLES DRAWN AEROBIC AND ANAEROBIC Blood Culture results may not be optimal due to an inadequate volume of blood received in culture bottles Performed at [facility] [address] [place], [place] [address]    Culture   Final    NO GROWTH 5 DAYS Performed at [facility], [place] [address], [place], [place] [address]    Report Status 07/26/2018 FINAL  Final  Blood culture (routine x 2)     Status: None   Collection Time: 07/21/18  1:39 PM  Result Value Ref Range Status   Specimen Description   Final    LEFT ANTECUBITAL Performed at [facility] [address] [place], [place] [address]    Special Requests   Final    BOTTLES DRAWN AEROBIC AND ANAEROBIC Blood Culture adequate volume Performed at [facility] [address] [place], [place] [address]    Culture   Final    NO GROWTH 5 DAYS Performed at [facility], [place] [address], [place], [place] [address]    Report Status 07/26/2018 FINAL  Final         Radiology Studies: No results found.   Scheduled Meds: . acidophilus   1 capsule Oral Daily  . amLODipine  5 mg Oral QPM  . aspirin EC  81 mg Oral QHS  . calcium-vitamin D  1 tablet Oral BID  . cromolyn  1 drop Both Eyes QID  . docusate sodium  100 mg Oral BID  . enoxaparin (LOVENOX) injection  40 mg Subcutaneous Q24H  . famotidine  20 mg Oral BID  . ferrous sulfate  325 mg Oral BID WC  . guaiFENesin  600 mg Oral Q12H  . lactulose  10 g Oral Once  . loratadine  10 mg Oral q morning - 10a  . losartan  100 mg Oral q morning - 10a  . multivitamin with minerals  1 tablet Oral Daily  . neomycin-bacitracin-polymyxin   Topical BID  . oxybutynin  5 mg Oral QHS  .  pantoprazole  40 mg Oral Daily  . polyethylene glycol  17 g Oral Daily  . senna  2 tablet Oral Once  . tamsulosin  0.4 mg Oral q morning - 10a   Continuous Infusions: . cefTRIAXone (ROCEPHIN)  IV 1 g (07/25/18 1204)     LOS: 5 days    Time spent: 25 minutes    Yaakov Guthrie, MD [facility] 07/26/2018, 10:49 AM     Please page through [facility]:  [email or web address] Password [email or web address] If 7PM-7AM, please contact night-coverage

## 2018-07-27 LAB — CBC
HEMATOCRIT: 30 % — AB (ref 39.0–52.0)
Hemoglobin: 9.8 g/dL — ABNORMAL LOW (ref 13.0–17.0)
MCH: 31.3 pg (ref 26.0–34.0)
MCHC: 32.7 g/dL (ref 30.0–36.0)
MCV: 95.8 fL (ref 80.0–100.0)
Platelets: 526 10*3/uL — ABNORMAL HIGH (ref 150–400)
RBC: 3.13 MIL/uL — ABNORMAL LOW (ref 4.22–5.81)
RDW: 13.6 % (ref 11.5–15.5)
WBC: 13.9 10*3/uL — ABNORMAL HIGH (ref 4.0–10.5)
nRBC: 0 % (ref 0.0–0.2)

## 2018-07-27 LAB — BASIC METABOLIC PANEL
Anion gap: 7 (ref 5–15)
BUN: 13 mg/dL (ref 8–23)
CO2: 30 mmol/L (ref 22–32)
Calcium: 9.8 mg/dL (ref 8.9–10.3)
Chloride: 92 mmol/L — ABNORMAL LOW (ref 98–111)
Creatinine, Ser: 0.65 mg/dL (ref 0.61–1.24)
GFR calc Af Amer: >60 mL/min (ref >60)
GFR calc non Af Amer: >60 mL/min (ref >60)
Glucose, Bld: 179 mg/dL — ABNORMAL HIGH (ref 70–99)
Potassium: 4.4 mmol/L (ref 3.5–5.1)
Sodium: 129 mmol/L — ABNORMAL LOW (ref 135–145)

## 2018-07-27 MED ORDER — TRAZODONE HCL 50 MG PO TABS
50.0000 mg | ORAL_TABLET | Freq: Once | ORAL | Status: AC
  Administered 2018-07-27: 50 mg via ORAL
  Filled 2018-07-27: qty 1

## 2018-07-27 NOTE — Progress Notes (Signed)
PROGRESS NOTE    Sean Cooke  TMH:[id number] DOB: 05/06/1927 DOA: 07/21/2018 PCP: Sean Cooke      Brief Narrative:  Sean Cooke is a 84 y.o. M with MCI, recently diagnosed pancreatic cancer, hx of lung cancer, BPH, and chronic anemia who presents with leg swelling and scrotal swelling, edema, erythema. Scrotal cellulitis improved with antibiotics. Patient to had simulation for radiation planning on 07/26/2018.  Patient's wife wanting him to be placed in a facility as she is unable to care for him at home.  Social work, PT consulted.   Assessment & Plan:  Scrotal cellulitis Improving Currently afebrile, with resolving leukocytosis Ultrasound of scrotum with Doppler showed both testicles normal, no testicular mass torsion or orchitis.  Large bilateral hydrocele.  Diffuse scrotal wall thickening, with some degree of soft tissue edema Continue IV ceftriaxone  Acute on chronic diastolic CHF Echo in Oct showed normal EF, grade 1 DD.   On admission BNP elevated, lower extremity edema.  Diuresed 8 L Continue to hold Lasix  Chronic hyponatremia Patient has been adequately diuresed, no further need for Lasix Due to acute on chronic diastolic heart failure we will hold off on IV fluids Daily BMP  Hypertension Stable Continue amlodipine, losartan  Chronic iron deficiency anemia Continue iron   BPH  Patient to be discharged home with Foley catheter per urology.  Continue Flomax.  Will perform voiding trial as an outpatient  Pancreatic cancer Dx/d Sep 2019, not currently on therapy.   Had simulation on 07/26/18 for plans for radiation  Hx stage IIIB NSCLC RML, s/p Chemo 2018  Debility Awaiting SNF    DVT prophylaxis: Lovenox Code Status: FULL Family Communication: None at bedside Disposition: SNF   Consultants:   Urology  Procedures:   None  Antimicrobials:   Ceftraixone    Subjective: Patient denies any new complaints, reported  he had a good BM.  Objective: Vitals:   07/26/18 2111 07/26/18 2317 07/27/18 0431 07/27/18 1433  BP: 137/64  130/70 123/64  Pulse: 81  80 73  Resp: (!) 36 (!) 30 (!) 24 20  Temp: 98.6 F (37 C)  98.1 F (36.7 C) 97.8 F (36.6 C)  TempSrc: Oral  Oral Oral  SpO2: 95%  95% 99%  Weight:   66.1 kg   Height:        Intake/Output Summary (Last 24 hours) at 07/27/2018 1600 Last data filed at 07/27/2018 1500 Gross per 24 hour  Intake 573.86 ml  Output 650 ml  Net -76.14 ml   Filed Weights   07/22/18 0726 07/25/18 0538 07/27/18 0431  Weight: 70.4 kg 67.1 kg 66.1 kg    Examination:  General: NAD, elderly male  Cardiovascular: S1, S2 present  Respiratory: CTAB  Abdomen: Soft, nontender, nondistended, bowel sounds present  Musculoskeletal: No bilateral pedal edema noted  Skin: Normal  Psychiatry: Normal mood   Data Reviewed: I have personally reviewed following labs and imaging studies:  CBC: Recent Labs  Lab 07/21/18 1228  07/23/18 0459 07/24/18 0450 07/25/18 0444 07/26/18 0537 07/27/18 0605  WBC 21.3*   < > 17.7* 20.2* 19.7* 16.6* 13.9*  NEUTROABS 17.1*  --   --   --   --   --   --   HGB 9.6*   < > 10.4* 9.6* 9.1* 9.4* 9.8*  HCT 29.5*   < > 31.5* 29.3* 27.4* 27.6* 30.0*  MCV 98.3   < > 96.0 97.3 98.2 92.3 95.8  PLT 450*   < >  493* 438* 428* 470* 526*   < > = values in this interval not displayed.   Basic Metabolic Panel: Recent Labs  Lab 07/23/18 0459 07/24/18 0450 07/25/18 0444 07/26/18 0537 07/27/18 0605  NA 131* 132* 131* 130* 129*  K 4.8 3.9 4.0 4.3 4.4  CL 94* 97* 96* 97* 92*  CO2 29 25 26 25 30   GLUCOSE 144* 166* 140* 161* 179*  BUN 10 18 12 12 13   CREATININE 0.71 0.70 0.55* 0.55* 0.65  CALCIUM 9.8 9.8 9.6 9.5 9.8   GFR: Estimated Creatinine Clearance: 58.5 mL/min (by C-G formula based on SCr of 0.65 mg/dL). Liver Function Tests: No results for input(s): AST, ALT, ALKPHOS, BILITOT, PROT, ALBUMIN in the last 168 hours. No results for  input(s): LIPASE, AMYLASE in the last 168 hours. No results for input(s): AMMONIA in the last 168 hours. Coagulation Profile: No results for input(s): INR, PROTIME in the last 168 hours. Cardiac Enzymes: No results for input(s): CKTOTAL, CKMB, CKMBINDEX, TROPONINI in the last 168 hours. BNP (last 3 results) No results for input(s): PROBNP in the last 8760 hours. HbA1C: No results for input(s): HGBA1C in the last 72 hours. CBG: No results for input(s): GLUCAP in the last 168 hours. Lipid Profile: No results for input(s): CHOL, HDL, LDLCALC, TRIG, CHOLHDL, LDLDIRECT in the last 72 hours. Thyroid Function Tests: No results for input(s): TSH, T4TOTAL, FREET4, T3FREE, THYROIDAB in the last 72 hours. Anemia Panel: No results for input(s): VITAMINB12, FOLATE, FERRITIN, TIBC, IRON, RETICCTPCT in the last 72 hours. Urine analysis:    Component Value Date/Time   COLORURINE STRAW (A) 07/21/2018 1229   APPEARANCEUR CLEAR 07/21/2018 1229   LABSPEC 1.004 (L) 07/21/2018 1229   PHURINE 6.0 07/21/2018 1229   GLUCOSEU NEGATIVE 07/21/2018 1229   HGBUR NEGATIVE 07/21/2018 1229   BILIRUBINUR NEGATIVE 07/21/2018 1229   KETONESUR NEGATIVE 07/21/2018 1229   PROTEINUR NEGATIVE 07/21/2018 1229   UROBILINOGEN 1.0 10/04/2014 2149   NITRITE NEGATIVE 07/21/2018 1229   LEUKOCYTESUR NEGATIVE 07/21/2018 1229   Sepsis Labs: @LABRCNTIP (procalcitonin:4,lacticacidven:4)  ) Recent Results (from the past 240 hour(s))  Blood culture (routine x 2)     Status: None   Collection Time: 07/21/18  1:39 PM  Result Value Ref Range Status   Specimen Description   Final    BLOOD RIGHT FOREARM Performed at [facility], [place] [address], [place], [place] [address]    Special Requests   Final    BOTTLES DRAWN AEROBIC AND ANAEROBIC Blood Culture results may not be optimal due to an inadequate volume of blood received in culture bottles Performed at [facility] [address],  [place], [place] [address]    Culture   Final    NO GROWTH 5 DAYS Performed at [facility], [place] [address], [place], [place] [address]    Report Status 07/26/2018 FINAL  Final  Blood culture (routine x 2)     Status: None   Collection Time: 07/21/18  1:39 PM  Result Value Ref Range Status   Specimen Description   Final    LEFT ANTECUBITAL Performed at [facility] [address] [place], [place] [address]    Special Requests   Final    BOTTLES DRAWN AEROBIC AND ANAEROBIC Blood Culture adequate volume Performed at [facility] [address], [place], [place] [address]    Culture   Final    NO GROWTH 5 DAYS Performed at [facility], [address] [place], [place] [address]    Report Status 07/26/2018 FINAL  Final         Radiology Studies: No results found.   Scheduled Meds: . acidophilus  1 capsule Oral Daily  . amLODipine  5 mg Oral QPM  . aspirin EC  81 mg Oral QHS  . calcium-vitamin D  1 tablet Oral BID  . cromolyn  1 drop Both Eyes QID  . docusate sodium  100 mg Oral BID  . enoxaparin (LOVENOX) injection  40 mg Subcutaneous Q24H  . famotidine  20 mg Oral BID  . ferrous sulfate  325 mg Oral BID WC  . guaiFENesin  600 mg Oral Q12H  . loratadine  10 mg Oral q morning - 10a  . losartan  100 mg Oral q morning - 10a  . multivitamin with minerals  1 tablet Oral Daily  . neomycin-bacitracin-polymyxin   Topical BID  . oxybutynin  5 mg Oral QHS  . pantoprazole  40 mg Oral Daily  . polyethylene glycol  17 g Oral Daily  . senna  2 tablet Oral Once  . tamsulosin  0.8 mg Oral q morning - 10a   Continuous Infusions: . cefTRIAXone (ROCEPHIN)  IV 1 g (07/27/18 1311)     LOS: 6 days       Alma Friendly, MD [facility] 07/27/2018, 4:00 PM

## 2018-07-28 LAB — CBC WITH DIFFERENTIAL/PLATELET
Abs Immature Granulocytes: 0.22 10*3/uL — ABNORMAL HIGH (ref 0.00–0.07)
Basophils Absolute: 0.1 10*3/uL (ref 0.0–0.1)
Basophils Relative: 1 %
Eosinophils Absolute: 0.6 10*3/uL — ABNORMAL HIGH (ref 0.0–0.5)
Eosinophils Relative: 6 %
HCT: 28.3 % — ABNORMAL LOW (ref 39.0–52.0)
Hemoglobin: 9.2 g/dL — ABNORMAL LOW (ref 13.0–17.0)
Immature Granulocytes: 2 %
Lymphocytes Relative: 15 %
Lymphs Abs: 1.4 10*3/uL (ref 0.7–4.0)
MCH: 31.8 pg (ref 26.0–34.0)
MCHC: 32.5 g/dL (ref 30.0–36.0)
MCV: 97.9 fL (ref 80.0–100.0)
Monocytes Absolute: 1.8 10*3/uL — ABNORMAL HIGH (ref 0.1–1.0)
Monocytes Relative: 19 %
NRBC: 0 % (ref 0.0–0.2)
Neutro Abs: 5.5 10*3/uL (ref 1.7–7.7)
Neutrophils Relative %: 57 %
PLATELETS: 534 10*3/uL — AB (ref 150–400)
RBC: 2.89 MIL/uL — ABNORMAL LOW (ref 4.22–5.81)
RDW: 13.6 % (ref 11.5–15.5)
WBC: 9.5 10*3/uL (ref 4.0–10.5)

## 2018-07-28 LAB — BASIC METABOLIC PANEL
Anion gap: 9 (ref 5–15)
BUN: 14 mg/dL (ref 8–23)
CO2: 26 mmol/L (ref 22–32)
Calcium: 9.6 mg/dL (ref 8.9–10.3)
Chloride: 93 mmol/L — ABNORMAL LOW (ref 98–111)
Creatinine, Ser: 0.67 mg/dL (ref 0.61–1.24)
GFR calc Af Amer: >60 mL/min (ref >60)
Glucose, Bld: 162 mg/dL — ABNORMAL HIGH (ref 70–99)
Potassium: 4.2 mmol/L (ref 3.5–5.1)
Sodium: 128 mmol/L — ABNORMAL LOW (ref 135–145)

## 2018-07-28 NOTE — NC FL2 (Signed)
[place] LEVEL OF CARE SCREENING TOOL     IDENTIFICATION  Patient Name: Sean Cooke Birthdate: 1927-03-02 Sex: male Admission Date (Current Location): 07/21/2018  [facility] and [place] Number:  Herbalist and Address:  [facility],  [place], [place]      Provider Number: [id number]  Attending Physician Name and Address:  Alma Friendly, MD  Relative Name and Phone Number:  Bernis Schreur: [phone]    Current Level of Care: Hospital Recommended Level of Care: [place] Prior Approval Number:    Date Approved/Denied:   PASRR Number: [id number]   Discharge Plan: SNF    Current Diagnoses: Patient Active Problem List   Diagnosis Date Noted  . Cellulitis 07/21/2018  . Coagulopathy ([place]) 06/21/2018  . Preoperative clearance 05/22/2018  . Primary adenocarcinoma of body of pancreas ([place]) 04/18/2018  . Diarrhea 10/29/2015  . Encounter for antineoplastic chemotherapy 07/31/2015  . Malignant neoplasm of right upper lobe of lung ([place]) 07/15/2015  . Hyponatremia 10/07/2014  . Community acquired bacterial pneumonia 10/04/2014  . Leukocytosis 10/04/2014  . Essential hypertension 10/04/2014  . BPH (benign prostatic hyperplasia) 10/04/2014  . Abdominal pain, LLQ (left lower quadrant) 04/03/2014  . Constipation 08/20/2013  . Lactose intolerance 08/20/2013  . GERD 04/27/2008  . THROMBOCYTHEMIA 04/23/2008  . Normocytic anemia 04/23/2008  . SPONDYLOSIS, LUMBAR 04/23/2008  . NEURITIS, LUMBOSACRAL 04/23/2008  . OSTEOPENIA 04/23/2008  . ASPLENIA 04/23/2008  . URINARY FREQUENCY 04/23/2008  . COLONIC POLYPS, ADENOMATOUS, HX OF 04/23/2008    Orientation RESPIRATION BLADDER Height & Weight     Self, Time, Situation, Place  Normal Indwelling catheter Weight: 145 lb 11.2 oz (66.1 kg) Height:  5\' 8"  (172.7 cm)  BEHAVIORAL SYMPTOMS/MOOD NEUROLOGICAL BOWEL NUTRITION STATUS      Continent  Diet(Regular)  AMBULATORY STATUS COMMUNICATION OF NEEDS Skin   Limited Assist Verbally Normal                       Personal Care Assistance Level of Assistance  Bathing, Feeding, Dressing Bathing Assistance: Limited assistance Feeding assistance: Limited assistance Dressing Assistance: Limited assistance     Functional Limitations Info  Hearing, Speech, Sight Sight Info: Adequate Hearing Info: Adequate Speech Info: Adequate    SPECIAL CARE FACTORS FREQUENCY  PT (By licensed PT), OT (By licensed OT)     PT Frequency: 5x/week OT Frequency: 5x/week            Contractures Contractures Info: Not present    Additional Factors Info  Code Status, Allergies Code Status Info: Full Allergies Info: BENZONATATE, LYRICA PREGABALIN, GABAPENTIN, TIZANIDINE, PENICILLINS, TRAMADOL            Current Medications (07/28/2018):  This is the current hospital active medication list Current Facility-Administered Medications  Medication Dose Route Frequency Provider Last Rate Last Dose  . acetaminophen (TYLENOL) tablet 650 mg  650 mg Oral Q6H PRN Arrien, Jimmy Picket, MD       Or  . acetaminophen (TYLENOL) suppository 650 mg  650 mg Rectal Q6H PRN Arrien, Jimmy Picket, MD      . acidophilus (RISAQUAD) capsule 1 capsule  1 capsule Oral Daily Arrien, Jimmy Picket, MD   1 capsule at 07/28/18 0851  . alum & mag hydroxide-simeth (MAALOX/MYLANTA) 200-200-20 MG/5ML suspension 30 mL  30 mL Oral Q4H PRN Matcha, Anupama, MD      . amLODipine (NORVASC) tablet 5 mg  5 mg Oral QPM Arrien, Jimmy Picket, MD  5 mg at 07/27/18 1801  . aspirin EC tablet 81 mg  81 mg Oral QHS Tawni Millers, MD   81 mg at 07/27/18 2151  . bisacodyl (DULCOLAX) EC tablet 5 mg  5 mg Oral Daily PRN Arrien, Jimmy Picket, MD      . calcium-vitamin D (OSCAL WITH D) 500-200 MG-UNIT per tablet 1 tablet  1 tablet Oral BID Arrien, Jimmy Picket, MD   1 tablet at 07/28/18 [phone]  . cefTRIAXone (ROCEPHIN)  1 g in sodium chloride 0.9 % 100 mL IVPB  1 g Intravenous Q24H Danford, Suann Larry, MD 200 mL/hr at 07/28/18 1314 1 g at 07/28/18 1314  . cromolyn (OPTICROM) 4 % ophthalmic solution 1 drop  1 drop Both Eyes QID Arrien, Jimmy Picket, MD   1 drop at 07/28/18 1312  . docusate sodium (COLACE) capsule 100 mg  100 mg Oral BID Kathie Rhodes, MD   100 mg at 07/28/18 0851  . enoxaparin (LOVENOX) injection 40 mg  40 mg Subcutaneous Q24H Tawni Millers, MD   40 mg at 07/27/18 2152  . famotidine (PEPCID) tablet 20 mg  20 mg Oral BID Tawni Millers, MD   20 mg at 07/28/18 0851  . ferrous sulfate tablet 325 mg  325 mg Oral BID WC Arrien, Jimmy Picket, MD   325 mg at 07/28/18 [phone]  . guaiFENesin (MUCINEX) 12 hr tablet 600 mg  600 mg Oral Q12H Arrien, Jimmy Picket, MD   600 mg at 07/28/18 0851  . HYDROmorphone (DILAUDID) tablet 2 mg  2 mg Oral Q6H PRN Bodenheimer, Charles A, NP   2 mg at 07/24/18 1016  . ibuprofen (ADVIL,MOTRIN) tablet 800 mg  800 mg Oral TID PRN Yaakov Guthrie, MD   800 mg at 07/27/18 2226  . loperamide (IMODIUM) capsule 2 mg  2 mg Oral PRN Arrien, Jimmy Picket, MD      . loratadine (CLARITIN) tablet 10 mg  10 mg Oral q morning - 10a Arrien, Jimmy Picket, MD   10 mg at 07/28/18 [phone]  . losartan (COZAAR) tablet 100 mg  100 mg Oral q morning - 10a Arrien, Jimmy Picket, MD   100 mg at 07/28/18 0851  . menthol-cetylpyridinium (CEPACOL) lozenge 3 mg  1 lozenge Oral PRN Arrien, Jimmy Picket, MD      . multivitamin with minerals tablet 1 tablet  1 tablet Oral Daily Arrien, Jimmy Picket, MD   1 tablet at 07/28/18 [phone]  . neomycin-bacitracin-polymyxin (NEOSPORIN) ointment 1 application  1 application Topical TID PRN Kathie Rhodes, MD      . neomycin-bacitracin-polymyxin (NEOSPORIN) ointment   Topical BID Matcha, Anupama, MD      . ondansetron (ZOFRAN) tablet 4 mg  4 mg Oral Q6H PRN Arrien, Jimmy Picket, MD       Or  . ondansetron [facility]) injection 4 mg  4  mg Intravenous Q6H PRN Arrien, Jimmy Picket, MD      . oxybutynin [facility]) tablet 5 mg  5 mg Oral QHS Arrien, Jimmy Picket, MD   5 mg at 07/27/18 2151  . pantoprazole (PROTONIX) EC tablet 40 mg  40 mg Oral Daily Arrien, Jimmy Picket, MD   40 mg at 07/28/18 0851  . polyethylene glycol (MIRALAX / GLYCOLAX) packet 17 g  17 g Oral Daily Matcha, Anupama, MD   17 g at 07/27/18 0837  . senna (SENOKOT) tablet 17.2 mg  2 tablet Oral Once Matcha, Anupama, MD      . simethicone (MYLICON) chewable tablet  80 mg  80 mg Oral Q6H PRN Tawni Millers, MD   80 mg at 07/25/18 0817  . tamsulosin (FLOMAX) capsule 0.8 mg  0.8 mg Oral q morning - 10a Matcha, Anupama, MD   0.8 mg at 07/28/18 8786     Discharge Medications: Please see discharge summary for a list of discharge medications.  Relevant Imaging Results:  Relevant Lab Results:   Additional Information SSN: [id number]  Pricilla Holm, [place]

## 2018-07-28 NOTE — Progress Notes (Signed)
PROGRESS NOTE    Sean Cooke  YQM:[id number] DOB: 1926-10-20 DOA: 07/21/2018 PCP: [place]      Brief Narrative:  Sean Cooke is a 84 y.o. M with MCI, recently diagnosed pancreatic cancer, hx of lung cancer, BPH, and chronic anemia who presents with leg swelling and scrotal swelling, edema, erythema. Scrotal cellulitis improved with antibiotics. Patient to had simulation for radiation planning on 07/26/2018.  Patient's wife wanting him to be placed in a facility as she is unable to care for him at home.  Social work, PT consulted.   Assessment & Plan:  Scrotal cellulitis Improved Currently afebrile, with resolved leukocytosis BC X 2 NGTD UA negative Ultrasound of scrotum with Doppler showed both testicles normal, no testicular mass torsion or orchitis.  Large bilateral hydrocele.  Diffuse scrotal wall thickening, with some degree of soft tissue edema Continue IV ceftriaxone  Acute on chronic diastolic CHF Echo in Oct showed normal EF, grade 1 DD.   On admission BNP elevated, lower extremity edema.  Diuresed 8 L Continue to hold Lasix  Chronic hyponatremia Na currently at baseline, AAO X 4 Patient has been adequately diuresed, no further need for Lasix Due to acute on chronic diastolic heart failure we will hold off on IV fluids  Hypertension Stable Continue amlodipine, losartan  Chronic iron deficiency anemia Continue iron   BPH  Patient to be discharged home with Foley catheter per urology.  Continue Flomax.  Will perform voiding trial as an outpatient  Pancreatic cancer Dx/d Sep 2019, not currently on therapy.   Had simulation on 07/26/18 for plans for radiation  Hx stage IIIB NSCLC RML, s/p Chemo 2018  Debility Awaiting SNF    DVT prophylaxis: Lovenox Code Status: FULL Family Communication: None at bedside Disposition: SNF   Consultants:   Urology  Procedures:   None  Antimicrobials:   Ceftraixone     Subjective: Patient denies any new complaints. Eating and drinking well, good appetite  Objective: Vitals:   07/27/18 1433 07/27/18 2046 07/28/18 0622 07/28/18 1347  BP: 123/64 (!) 143/79 (!) 152/65 131/61  Pulse: 73 76 72 69  Resp: 20 20 18 18   Temp: 97.8 F (36.6 C) 98.6 F (37 C) 97.7 F (36.5 C) (!) 97.2 F (36.2 C)  TempSrc: Oral Oral Oral Oral  SpO2: 99% 96% 94% 97%  Weight:      Height:        Intake/Output Summary (Last 24 hours) at 07/28/2018 1409 Last data filed at 07/28/2018 0846 Gross per 24 hour  Intake 213.86 ml  Output 975 ml  Net -761.14 ml   Filed Weights   07/22/18 0726 07/25/18 0538 07/27/18 0431  Weight: 70.4 kg 67.1 kg 66.1 kg    Examination:  General: NAD, elderly male   Cardiovascular: S1, S2 present  Respiratory: CTAB  Abdomen: Soft, nontender, nondistended, bowel sounds present  Musculoskeletal: No bilateral pedal edema noted  Skin: Normal  Psychiatry: Normal mood   Data Reviewed: I have personally reviewed following labs and imaging studies:  CBC: Recent Labs  Lab 07/24/18 0450 07/25/18 0444 07/26/18 0537 07/27/18 0605 07/28/18 0621  WBC 20.2* 19.7* 16.6* 13.9* 9.5  NEUTROABS  --   --   --   --  5.5  HGB 9.6* 9.1* 9.4* 9.8* 9.2*  HCT 29.3* 27.4* 27.6* 30.0* 28.3*  MCV 97.3 98.2 92.3 95.8 97.9  PLT 438* 428* 470* 526* 889*   Basic Metabolic Panel: Recent Labs  Lab 07/24/18 0450 07/25/18 0444  07/26/18 0537 07/27/18 0605 07/28/18 0621  NA 132* 131* 130* 129* 128*  K 3.9 4.0 4.3 4.4 4.2  CL 97* 96* 97* 92* 93*  CO2 25 26 25 30 26   GLUCOSE 166* 140* 161* 179* 162*  BUN 18 12 12 13 14   CREATININE 0.70 0.55* 0.55* 0.65 0.67  CALCIUM 9.8 9.6 9.5 9.8 9.6   GFR: Estimated Creatinine Clearance: 58.5 mL/min (by C-G formula based on SCr of 0.67 mg/dL). Liver Function Tests: No results for input(s): AST, ALT, ALKPHOS, BILITOT, PROT, ALBUMIN in the last 168 hours. No results for input(s): LIPASE, AMYLASE in the  last 168 hours. No results for input(s): AMMONIA in the last 168 hours. Coagulation Profile: No results for input(s): INR, PROTIME in the last 168 hours. Cardiac Enzymes: No results for input(s): CKTOTAL, CKMB, CKMBINDEX, TROPONINI in the last 168 hours. BNP (last 3 results) No results for input(s): PROBNP in the last 8760 hours. HbA1C: No results for input(s): HGBA1C in the last 72 hours. CBG: No results for input(s): GLUCAP in the last 168 hours. Lipid Profile: No results for input(s): CHOL, HDL, LDLCALC, TRIG, CHOLHDL, LDLDIRECT in the last 72 hours. Thyroid Function Tests: No results for input(s): TSH, T4TOTAL, FREET4, T3FREE, THYROIDAB in the last 72 hours. Anemia Panel: No results for input(s): VITAMINB12, FOLATE, FERRITIN, TIBC, IRON, RETICCTPCT in the last 72 hours. Urine analysis:    Component Value Date/Time   COLORURINE STRAW (A) 07/21/2018 1229   APPEARANCEUR CLEAR 07/21/2018 1229   LABSPEC 1.004 (L) 07/21/2018 1229   PHURINE 6.0 07/21/2018 1229   GLUCOSEU NEGATIVE 07/21/2018 1229   HGBUR NEGATIVE 07/21/2018 1229   BILIRUBINUR NEGATIVE 07/21/2018 1229   KETONESUR NEGATIVE 07/21/2018 1229   PROTEINUR NEGATIVE 07/21/2018 1229   UROBILINOGEN 1.0 10/04/2014 2149   NITRITE NEGATIVE 07/21/2018 1229   LEUKOCYTESUR NEGATIVE 07/21/2018 1229   Sepsis Labs: @LABRCNTIP (procalcitonin:4,lacticacidven:4)  ) Recent Results (from the past 240 hour(s))  Blood culture (routine x 2)     Status: None   Collection Time: 07/21/18  1:39 PM  Result Value Ref Range Status   Specimen Description   Final    BLOOD RIGHT FOREARM Performed at [facility], [place] [address], [place], [place] [address]    Special Requests   Final    BOTTLES DRAWN AEROBIC AND ANAEROBIC Blood Culture results may not be optimal due to an inadequate volume of blood received in culture bottles Performed at [place] [address], [place], [place] [address]    Culture    Final    NO GROWTH 5 DAYS Performed at [facility], [place] [address] [place], [place] [address]    Report Status 07/26/2018 FINAL  Final  Blood culture (routine x 2)     Status: None   Collection Time: 07/21/18  1:39 PM  Result Value Ref Range Status   Specimen Description   Final    LEFT ANTECUBITAL Performed at [place] [facility] [address], [place], [place] [address]    Special Requests   Final    BOTTLES DRAWN AEROBIC AND ANAEROBIC Blood Culture adequate volume Performed at [facility] [address] [place], [place] [address]    Culture   Final    NO GROWTH 5 DAYS Performed at [facility], [place] [address] [place], [place] [address]    Report Status 07/26/2018 FINAL  Final         Radiology Studies: No results found.  Scheduled Meds: . acidophilus  1 capsule Oral Daily  . amLODipine  5 mg Oral QPM  . aspirin EC  81 mg Oral QHS  . calcium-vitamin D  1 tablet Oral BID  . cromolyn  1 drop Both Eyes QID  . docusate sodium  100 mg Oral BID  . enoxaparin (LOVENOX) injection  40 mg Subcutaneous Q24H  . famotidine  20 mg Oral BID  . ferrous sulfate  325 mg Oral BID WC  . guaiFENesin  600 mg Oral Q12H  . loratadine  10 mg Oral q morning - 10a  . losartan  100 mg Oral q morning - 10a  . multivitamin with minerals  1 tablet Oral Daily  . neomycin-bacitracin-polymyxin   Topical BID  . oxybutynin  5 mg Oral QHS  . pantoprazole  40 mg Oral Daily  . polyethylene glycol  17 g Oral Daily  . senna  2 tablet Oral Once  . tamsulosin  0.8 mg Oral q morning - 10a   Continuous Infusions: . cefTRIAXone (ROCEPHIN)  IV 1 g (07/28/18 1314)     LOS: 7 days       Alma Friendly, MD [facility] 07/28/2018, 2:09 PM

## 2018-07-29 ENCOUNTER — Inpatient Hospital Stay: Payer: PPO | Attending: Internal Medicine | Admitting: Internal Medicine

## 2018-07-29 ENCOUNTER — Inpatient Hospital Stay: Payer: PPO

## 2018-07-29 MED ORDER — TRAZODONE HCL 50 MG PO TABS
50.0000 mg | ORAL_TABLET | Freq: Every evening | ORAL | Status: DC | PRN
  Administered 2018-07-29 – 2018-08-01 (×3): 50 mg via ORAL
  Filled 2018-07-29 (×4): qty 1

## 2018-07-29 NOTE — Consult Note (Signed)
   [facility] CM Inpatient Consult   07/29/2018  LAVORIS SPARLING [birth year] [phone]   Patient screened for potential [facility] Care Management services due to unplanned readmission risk score of 21% (medium).  Went to bedside to speak with both patient and wife about [place] Management program.  Mrs. Cheese states she is not exactly sure how [place] Management could assist at this time. She denies having any [facility] Care Management needs at this time. Pleasantly accepted [facility] Care Management brochure with contact information to call should she change her mind in the future.  Will make (covering) inpatient RNCM aware [place] Management services were declined.    Marthenia Rolling, MSN-Ed, RN,BSN [facility] Liaison [phone]

## 2018-07-29 NOTE — Care Management Important Message (Signed)
Important Message  Patient Details  Name: Sean Cooke MRN: [id number] Date of Birth: October 25, 1926   Medicare Important Message Given:  Yes    Kerin Salen 07/29/2018, 3:07 [place] Message  Patient Details  Name: Sean Cooke MRN: [id number] Date of Birth: 1926/10/15   Medicare Important Message Given:  Yes    Kerin Salen 07/29/2018, 3:07 PM

## 2018-07-29 NOTE — Progress Notes (Signed)
Occupational Therapy Treatment Patient Details Name: Sean Cooke MRN: [id number] DOB: 10-Apr-1927 Today's Date: 07/29/2018    History of present illness 84 Yo male admitted for scrotal cellulitis. H/O lung and pancreatic  cancer, DDD, BPH.   OT comments  Pts wife wants pt to go to [facility] [facility] to increase I with ADL activity   Follow Up Recommendations  SNF;Supervision/Assistance - 24 hour    Equipment Recommendations  None recommended by OT    Recommendations for Other Services      Precautions / Restrictions Precautions Precautions: Fall       Mobility Bed Mobility Overal bed mobility: Needs Assistance Bed Mobility: Supine to Sit;Sit to Supine     Supine to sit: Min assist Sit to supine: Min assist      Transfers        NT- pt declined OOB              Balance Overall balance assessment: Needs assistance Sitting-balance support: Feet supported Sitting balance-Leahy Scale: Good     Standing balance support: Bilateral upper extremity supported Standing balance-Leahy Scale: Poor Standing balance comment: heavy reliance on UE support                           ADL either performed or assessed with clinical judgement   ADL Overall ADL's : Needs assistance/impaired Eating/Feeding: Set up;Sitting Eating/Feeding Details (indicate cue type and reason): with proper positioning.  Initially pts wife feeding pt .  Explained reasons pt needed to do this himsself Grooming: Set up;Sitting                                 General ADL Comments: OT walked in and pts wife feeding him.  Explained importance of pt using BUE for ADL activity at this time such as self feeding and grooming.  OT worked with pt on positoning to increase pts I with ADL activity .       Vision Baseline Vision/History: Wears glasses Wears Glasses: Reading only Patient Visual Report: No change from baseline            Cognition Arousal/Alertness:  Awake/alert Behavior During Therapy: WFL for tasks assessed/performed Overall Cognitive Status: History of cognitive impairments - at baseline                                                     Pertinent Vitals/ Pain       Faces Pain Scale: Hurts a little bit Pain Location: back Pain Descriptors / Indicators: Aching;Discomfort;Grimacing;Guarding Pain Intervention(s): Limited activity within patient's tolerance;Repositioned         Frequency  Min 2X/week        Progress Toward Goals  OT Goals(current goals can now be found in the care plan section)  Progress towards OT goals: Progressing toward goals     Plan Discharge plan remains appropriate       AM-PAC OT "6 Clicks" Daily Activity     Outcome Measure   Help from another person eating meals?: None Help from another person taking care of personal grooming?: A Little Help from another person toileting, which includes using toliet, bedpan, or urinal?: A Lot Help from another person bathing (including washing, rinsing, drying)?: A  Lot Help from another person to put on and taking off regular upper body clothing?: A Little Help from another person to put on and taking off regular lower body clothing?: A Lot 6 Click Score: 16    End of Session Equipment Utilized During Treatment: Gait belt;Rolling walker  OT Visit Diagnosis: Other abnormalities of gait and mobility (R26.89);Muscle weakness (generalized) (M62.81);Pain Pain - part of body: (generalized)   Activity Tolerance Patient tolerated treatment well   Patient Left in bed;with call bell/phone within reach;with family/visitor present   Nurse Communication Mobility status        Time: 7341-9379 OT Time Calculation (min): 18 min  Charges: OT General Charges $OT Visit: 1 Visit OT Treatments $Self Care/Home Management : 8-22 mins  Kari Baars, [place] [phone] Office- [phone]      Merom, Edwena Felty D 07/29/2018, 5:16 PM

## 2018-07-29 NOTE — Progress Notes (Signed)
PROGRESS NOTE    Sean Cooke  GYJ:[id number] DOB: 1927-02-10 DOA: 07/21/2018 PCP: [facility]      Brief Narrative:  Sean Cooke is a 84 y.o. M with MCI, recently diagnosed pancreatic cancer, hx of lung cancer, BPH, and chronic anemia who presents with leg swelling and scrotal swelling, edema, erythema. Scrotal cellulitis improved with antibiotics. Patient to had simulation for radiation planning on 07/26/2018.  Patient's wife wanting him to be placed in a facility as she is unable to care for him at home.  Social work, PT consulted.   Assessment & Plan:  Scrotal cellulitis Improved Currently afebrile, with resolved leukocytosis BC X 2 NGTD UA negative Ultrasound of scrotum with Doppler showed both testicles normal, no testicular mass torsion or orchitis.  Large bilateral hydrocele.  Diffuse scrotal wall thickening, with some degree of soft tissue edema Continue IV ceftriaxone  Acute on chronic diastolic CHF Echo in Oct showed normal EF, grade 1 DD.   On admission BNP elevated, lower extremity edema.  Diuresed 8 L Continue to hold Lasix  Chronic hyponatremia Na currently at baseline, AAO X 4 Patient has been adequately diuresed, no further need for Lasix Due to acute on chronic diastolic heart failure we will hold off on IV fluids  Hypertension Stable Continue amlodipine, losartan  Chronic iron deficiency anemia Continue iron   BPH  Patient to be discharged home with Foley catheter per urology.  Continue Flomax.  Will perform voiding trial as an outpatient  Pancreatic cancer Dx/d Sep 2019, not currently on therapy.   Had simulation on 07/26/18 for plans for radiation  Hx stage IIIB NSCLC RML, s/p Chemo 2018  Debility Awaiting SNF  [place] Palliative care consulted    DVT prophylaxis: Lovenox Code Status: FULL Family Communication: None at bedside Disposition: SNF   Consultants:   Urology  Procedures:    None  Antimicrobials:   Ceftraixone    Subjective: Patient denies any new complaints   Objective: Vitals:   07/28/18 2019 07/29/18 0435 07/29/18 1322 07/29/18 2033  BP: 137/70 129/73 120/64 133/67  Pulse: 75 62 72 73  Resp: 20 18 16 20   Temp: 98.2 F (36.8 C) (!) 97.5 F (36.4 C) 97.7 F (36.5 C) 98.7 F (37.1 C)  TempSrc: Oral Oral Oral Oral  SpO2: 98% 96% 94% 95%  Weight:  71.9 kg    Height:        Intake/Output Summary (Last 24 hours) at 07/29/2018 2119 Last data filed at 07/29/2018 1700 Gross per 24 hour  Intake 320 ml  Output 850 ml  Net -530 ml   Filed Weights   07/25/18 0538 07/27/18 0431 07/29/18 0435  Weight: 67.1 kg 66.1 kg 71.9 kg    Examination:  General: NAD   Cardiovascular: S1, S2 present  Respiratory: CTAB  Abdomen: Soft, nontender, nondistended, bowel sounds present  Musculoskeletal: No bilateral pedal edema noted  Skin: Normal  Psychiatry: Normal mood   Data Reviewed: I have personally reviewed following labs and imaging studies:  CBC: Recent Labs  Lab 07/24/18 0450 07/25/18 0444 07/26/18 0537 07/27/18 0605 07/28/18 0621  WBC 20.2* 19.7* 16.6* 13.9* 9.5  NEUTROABS  --   --   --   --  5.5  HGB 9.6* 9.1* 9.4* 9.8* 9.2*  HCT 29.3* 27.4* 27.6* 30.0* 28.3*  MCV 97.3 98.2 92.3 95.8 97.9  PLT 438* 428* 470* 526* 263*   Basic Metabolic Panel: Recent Labs  Lab 07/24/18 0450 07/25/18 0444 07/26/18 0537 07/27/18  0174 07/28/18 0621  NA 132* 131* 130* 129* 128*  K 3.9 4.0 4.3 4.4 4.2  CL 97* 96* 97* 92* 93*  CO2 25 26 25 30 26   GLUCOSE 166* 140* 161* 179* 162*  BUN 18 12 12 13 14   CREATININE 0.70 0.55* 0.55* 0.65 0.67  CALCIUM 9.8 9.6 9.5 9.8 9.6   GFR: Estimated Creatinine Clearance: 60.6 mL/min (by C-G formula based on SCr of 0.67 mg/dL). Liver Function Tests: No results for input(s): AST, ALT, ALKPHOS, BILITOT, PROT, ALBUMIN in the last 168 hours. No results for input(s): LIPASE, AMYLASE in the last 168  hours. No results for input(s): AMMONIA in the last 168 hours. Coagulation Profile: No results for input(s): INR, PROTIME in the last 168 hours. Cardiac Enzymes: No results for input(s): CKTOTAL, CKMB, CKMBINDEX, TROPONINI in the last 168 hours. BNP (last 3 results) No results for input(s): PROBNP in the last 8760 hours. HbA1C: No results for input(s): HGBA1C in the last 72 hours. CBG: No results for input(s): GLUCAP in the last 168 hours. Lipid Profile: No results for input(s): CHOL, HDL, LDLCALC, TRIG, CHOLHDL, LDLDIRECT in the last 72 hours. Thyroid Function Tests: No results for input(s): TSH, T4TOTAL, FREET4, T3FREE, THYROIDAB in the last 72 hours. Anemia Panel: No results for input(s): VITAMINB12, FOLATE, FERRITIN, TIBC, IRON, RETICCTPCT in the last 72 hours. Urine analysis:    Component Value Date/Time   COLORURINE STRAW (A) 07/21/2018 1229   APPEARANCEUR CLEAR 07/21/2018 1229   LABSPEC 1.004 (L) 07/21/2018 1229   PHURINE 6.0 07/21/2018 1229   GLUCOSEU NEGATIVE 07/21/2018 1229   HGBUR NEGATIVE 07/21/2018 1229   BILIRUBINUR NEGATIVE 07/21/2018 1229   KETONESUR NEGATIVE 07/21/2018 1229   PROTEINUR NEGATIVE 07/21/2018 1229   UROBILINOGEN 1.0 10/04/2014 2149   NITRITE NEGATIVE 07/21/2018 1229   LEUKOCYTESUR NEGATIVE 07/21/2018 1229   Sepsis Labs: @LABRCNTIP (procalcitonin:4,lacticacidven:4)  ) Recent Results (from the past 240 hour(s))  Blood culture (routine x 2)     Status: None   Collection Time: 07/21/18  1:39 PM  Result Value Ref Range Status   Specimen Description   Final    BLOOD RIGHT FOREARM Performed at [facility], [place] [address], [place], [place] [address]    Special Requests   Final    BOTTLES DRAWN AEROBIC AND ANAEROBIC Blood Culture results may not be optimal due to an inadequate volume of blood received in culture bottles Performed at [facility] [address], [place], [place] [address]    Culture   Final     NO GROWTH 5 DAYS Performed at [facility], [place] [address], [place], [place] [address]    Report Status 07/26/2018 FINAL  Final  Blood culture (routine x 2)     Status: None   Collection Time: 07/21/18  1:39 PM  Result Value Ref Range Status   Specimen Description   Final    LEFT ANTECUBITAL Performed at [facility] [address], [place], [place] [address]    Special Requests   Final    BOTTLES DRAWN AEROBIC AND ANAEROBIC Blood Culture adequate volume Performed at [facility] [address] [place], [place] [address]    Culture   Final    NO GROWTH 5 DAYS Performed at [facility], [place] [address] [place], [place] [address]    Report Status 07/26/2018 FINAL  Final         Radiology Studies: No results found.   Scheduled Meds: .  acidophilus  1 capsule Oral Daily  . amLODipine  5 mg Oral QPM  . aspirin EC  81 mg Oral QHS  . calcium-vitamin D  1 tablet Oral BID  . cromolyn  1 drop Both Eyes QID  . docusate sodium  100 mg Oral BID  . enoxaparin (LOVENOX) injection  40 mg Subcutaneous Q24H  . famotidine  20 mg Oral BID  . ferrous sulfate  325 mg Oral BID WC  . guaiFENesin  600 mg Oral Q12H  . loratadine  10 mg Oral q morning - 10a  . losartan  100 mg Oral q morning - 10a  . multivitamin with minerals  1 tablet Oral Daily  . neomycin-bacitracin-polymyxin   Topical BID  . oxybutynin  5 mg Oral QHS  . pantoprazole  40 mg Oral Daily  . polyethylene glycol  17 g Oral Daily  . senna  2 tablet Oral Once  . tamsulosin  0.8 mg Oral q morning - 10a   Continuous Infusions: . cefTRIAXone (ROCEPHIN)  IV 1 g (07/29/18 1152)     LOS: 8 days       Alma Friendly, MD [facility] 07/29/2018, 9:19 PM

## 2018-07-30 ENCOUNTER — Ambulatory Visit: Payer: PPO | Admitting: Radiation Oncology

## 2018-07-30 DIAGNOSIS — Z7189 Other specified counseling: Secondary | ICD-10-CM

## 2018-07-30 DIAGNOSIS — C251 Malignant neoplasm of body of pancreas: Secondary | ICD-10-CM

## 2018-07-30 DIAGNOSIS — Z515 Encounter for palliative care: Secondary | ICD-10-CM

## 2018-07-30 DIAGNOSIS — N492 Inflammatory disorders of scrotum: Secondary | ICD-10-CM

## 2018-07-30 NOTE — Clinical Social Work Note (Signed)
Clinical Social Work Assessment  Patient Details  Name: Sean Cooke MRN: [id number] Date of Birth: [birth year]  Date of referral:  07/30/18               Reason for consult:  Discharge Planning, Facility Placement                Permission sought to share information with:  Family Supports Permission granted to share information::  Yes, Verbal Permission Granted  Name::     Sean Cooke  Agency::  snf  Relationship::  spouse  Contact Information:  [phone]  Housing/Transportation Living arrangements for the past 2 months:  [place] of Information:  Patient, Spouse Patient Interpreter Needed:  None Criminal Activity/Legal Involvement Pertinent to Current Situation/Hospitalization:  No - Comment as needed Significant Relationships:  Other Family Members, Spouse Lives with:  Spouse Do you feel safe going back to the place where you live?  Yes Need for family participation in patient care:  Yes (Comment)  Care giving concerns:  Patient lives with spouse. CSW spoke with both patient and spouse at bedside to discuss discharge plans. Spouse stated she would like patient to go to rehab in the area since she is his only family. Spouse stated they do not have children and closet family is in [place] Worker assessment / plan:  CSW met patient at bedside with spouse present. Patient and spouse agreeable with discharge plan and stated they would like to be in the area. CSW to follow up with patient once bed offers are available.  Employment status:    Insurance information:  Other (Comment Required)(health team advantage) PT Recommendations:  [place] / Referral to community resources:  [place]  Patient/Family's Response to care:  Family supportive of patient and his needs  Patient/Family's Understanding of and Emotional Response to Diagnosis, Current Treatment, and Prognosis:  Family agreeable  with discharge plans to snf   Emotional Assessment Appearance:  Appears stated age Attitude/Demeanor/Rapport:  Engaged Affect (typically observed):  Accepting, Pleasant Orientation:  Oriented to Self, Oriented to Place, Oriented to Situation, Oriented to  Time Alcohol / Substance use:  Not Applicable Psych involvement (Current and /or in the community):  No (Comment)  Discharge Needs  Concerns to be addressed:  Care Coordination Readmission within the last 30 days:    Current discharge risk:  Dependent with Mobility Barriers to Discharge:  Continued Medical Work up   [employer], LCSW 07/30/2018, 3:41 PM

## 2018-07-30 NOTE — Progress Notes (Signed)
Physical Therapy Treatment Patient Details Name: Sean Cooke MRN: [id number] DOB: [birth year] Today's Date: 07/30/2018    History of Present Illness 84 Yo male admitted for scrotal cellulitis. H/O lung and pancreatic  cancer, DDD, BPH.    PT Comments    The patient continues to require assistance for safe ambulation, easily distracted at times. Continue PT.   Follow Up Recommendations  SNF;Supervision/Assistance - 24 hour     Equipment Recommendations  None recommended by PT    Recommendations for Other Services       Precautions / Restrictions Precautions Precautions: Fall    Mobility  Bed Mobility   Bed Mobility: Supine to Sit     Supine to sit: Supervision     General bed mobility comments: cues for technique from sidelying due to back pain, assist for trunk; to supine assist for legs  Transfers Overall transfer level: Needs assistance Equipment used: Rolling walker (2 wheeled) Transfers: Sit to/from Stand Sit to Stand: Min assist         General transfer comment: cues for safety, assist for balance/steady  Ambulation/Gait Ambulation/Gait assistance: Min assist Gait Distance (Feet): 150 Feet Assistive device: Rolling walker (2 wheeled) Gait Pattern/deviations: Step-to pattern;Step-through pattern Gait velocity: increased with slight festinating   General Gait Details: no c/o foot pain.   Stairs             Wheelchair Mobility    Modified Rankin (Stroke Patients Only)       Balance           Standing balance support: Bilateral upper extremity supported;During functional activity Standing balance-Leahy Scale: Fair                              Cognition Arousal/Alertness: Awake/alert   Overall Cognitive Status: History of cognitive impairments - at baseline                                        Exercises      General Comments        Pertinent Vitals/Pain Faces Pain Scale: Hurts little  more Pain Location: scrotum Pain Descriptors / Indicators: Discomfort Pain Intervention(s): Repositioned    Home Living                      Prior Function            PT Goals (current goals can now be found in the care plan section) Progress towards PT goals: Progressing toward goals    Frequency    Min 2X/week      PT Plan Current plan remains appropriate;Frequency needs to be updated    Co-evaluation              AM-PAC PT "6 Clicks" Mobility   Outcome Measure  Help needed turning from your back to your side while in a flat bed without using bedrails?: A Lot Help needed moving from lying on your back to sitting on the side of a flat bed without using bedrails?: A Lot Help needed moving to and from a bed to a chair (including a wheelchair)?: A Lot Help needed standing up from a chair using your arms (e.g., wheelchair or bedside chair)?: A Lot Help needed to walk in hospital room?: A Lot Help needed climbing 3-5 steps with a railing? : Total  6 Click Score: 11    End of Session Equipment Utilized During Treatment: Gait belt Activity Tolerance: Patient limited by pain Patient left: in chair;with call bell/phone within reach;with chair alarm set Nurse Communication: Mobility status PT Visit Diagnosis: Other abnormalities of gait and mobility (R26.89);Muscle weakness (generalized) (M62.81)     Time: 4320-0379 PT Time Calculation (min) (ACUTE ONLY): 28 min  Charges:  $Gait Training: 23-37 mins                     [place] Pager [phone] Office [phone]    Claretha Cooper 07/30/2018, 3:32 PM

## 2018-07-30 NOTE — Progress Notes (Signed)
PROGRESS NOTE    Sean Cooke  FUX:[id number] DOB: [birth year] DOA: 07/21/2018 PCP: [facility]      Brief Narrative:  Sean Cooke is a 84 y.o. M with MCI, recently diagnosed pancreatic cancer, hx of lung cancer, BPH, and chronic anemia who presents with leg swelling and scrotal swelling, edema, erythema. Scrotal cellulitis improved with antibiotics. Patient to had simulation for radiation planning on 07/26/2018.  Patient's wife wanting him to be placed in a facility as she is unable to care for him at home.  Social work, PT consulted.   Assessment & Plan:  Scrotal cellulitis Improved Currently afebrile, with resolved leukocytosis BC X 2 NGTD UA negative Ultrasound of scrotum with Doppler showed both testicles normal, no testicular mass torsion or orchitis.  Large bilateral hydrocele.  Diffuse scrotal wall thickening, with some degree of soft tissue edema S/P IV ceftriaxone for 10 days, completed   Acute on chronic diastolic CHF Echo in Oct showed normal EF, grade 1 DD.   On admission BNP elevated, lower extremity edema.  Diuresed 8 L Continue to hold Lasix  Chronic hyponatremia Na currently at baseline, AAO X 4 Patient has been adequately diuresed, no further need for Lasix Due to acute on chronic diastolic heart failure we will hold off on IV fluids  Hypertension Stable Continue amlodipine, losartan  Chronic iron deficiency anemia Continue iron   BPH  Patient to be discharged home with Foley catheter per urology.  Continue Flomax.  Will perform voiding trial as an outpatient  Pancreatic cancer Dx/d Sep 2019, not currently on therapy.   Had simulation on 07/26/18 for plans for radiation  Hx stage IIIB NSCLC RML, s/p Chemo 2018  Debility Awaiting SNF  Sean Cooke Palliative care consulted    DVT prophylaxis: Lovenox Code Status: FULL Family Communication: None at bedside Disposition: SNF   Consultants:   Urology  Procedures:    None  Antimicrobials:   None     Subjective: Patient denies any new complaints   Objective: Vitals:   07/29/18 2033 07/30/18 0344 07/30/18 0346 07/30/18 1325  BP: 133/67 115/67  (!) 124/55  Pulse: 73 75  73  Resp: 20 20  19   Temp: 98.7 F (37.1 C) 98.5 F (36.9 C)  97.6 F (36.4 C)  TempSrc: Oral Oral  Oral  SpO2: 95% 93%  99%  Weight:   65.6 kg   Height:        Intake/Output Summary (Last 24 hours) at 07/30/2018 1731 Last data filed at 07/30/2018 1326 Gross per 24 hour  Intake 420 ml  Output 925 ml  Net -505 ml   Filed Weights   07/27/18 0431 07/29/18 0435 07/30/18 0346  Weight: 66.1 kg 71.9 kg 65.6 kg    Examination:  General: NAD   Cardiovascular: S1, S2 present  Respiratory: CTAB  Abdomen: Soft, nontender, nondistended, bowel sounds present  Musculoskeletal: No bilateral pedal edema noted  Skin: Normal  Psychiatry: Normal mood   Data Reviewed: I have personally reviewed following labs and imaging studies:  CBC: Recent Labs  Lab 07/24/18 0450 07/25/18 0444 07/26/18 0537 07/27/18 0605 07/28/18 0621  WBC 20.2* 19.7* 16.6* 13.9* 9.5  NEUTROABS  --   --   --   --  5.5  HGB 9.6* 9.1* 9.4* 9.8* 9.2*  HCT 29.3* 27.4* 27.6* 30.0* 28.3*  MCV 97.3 98.2 92.3 95.8 97.9  PLT 438* 428* 470* 526* 025*   Basic Metabolic Panel: Recent Labs  Lab 07/24/18 0450 07/25/18 0444  07/26/18 0537 07/27/18 0605 07/28/18 0621  NA 132* 131* 130* 129* 128*  K 3.9 4.0 4.3 4.4 4.2  CL 97* 96* 97* 92* 93*  CO2 25 26 25 30 26   GLUCOSE 166* 140* 161* 179* 162*  BUN 18 12 12 13 14   CREATININE 0.70 0.55* 0.55* 0.65 0.67  CALCIUM 9.8 9.6 9.5 9.8 9.6   GFR: Estimated Creatinine Clearance: 58.1 mL/min (by C-G formula based on SCr of 0.67 mg/dL). Liver Function Tests: No results for input(s): AST, ALT, ALKPHOS, BILITOT, PROT, ALBUMIN in the last 168 hours. No results for input(s): LIPASE, AMYLASE in the last 168 hours. No results for input(s): AMMONIA in the  last 168 hours. Coagulation Profile: No results for input(s): INR, PROTIME in the last 168 hours. Cardiac Enzymes: No results for input(s): CKTOTAL, CKMB, CKMBINDEX, TROPONINI in the last 168 hours. BNP (last 3 results) No results for input(s): PROBNP in the last 8760 hours. HbA1C: No results for input(s): HGBA1C in the last 72 hours. CBG: No results for input(s): GLUCAP in the last 168 hours. Lipid Profile: No results for input(s): CHOL, HDL, LDLCALC, TRIG, CHOLHDL, LDLDIRECT in the last 72 hours. Thyroid Function Tests: No results for input(s): TSH, T4TOTAL, FREET4, T3FREE, THYROIDAB in the last 72 hours. Anemia Panel: No results for input(s): VITAMINB12, FOLATE, FERRITIN, TIBC, IRON, RETICCTPCT in the last 72 hours. Urine analysis:    Component Value Date/Time   COLORURINE STRAW (A) 07/21/2018 1229   APPEARANCEUR CLEAR 07/21/2018 1229   LABSPEC 1.004 (L) 07/21/2018 1229   PHURINE 6.0 07/21/2018 1229   GLUCOSEU NEGATIVE 07/21/2018 1229   HGBUR NEGATIVE 07/21/2018 1229   BILIRUBINUR NEGATIVE 07/21/2018 1229   KETONESUR NEGATIVE 07/21/2018 1229   PROTEINUR NEGATIVE 07/21/2018 1229   UROBILINOGEN 1.0 10/04/2014 2149   NITRITE NEGATIVE 07/21/2018 1229   LEUKOCYTESUR NEGATIVE 07/21/2018 1229   Sepsis Labs: @LABRCNTIP (procalcitonin:4,lacticacidven:4)  ) Recent Results (from the past 240 hour(s))  Blood culture (routine x 2)     Status: None   Collection Time: 07/21/18  1:39 PM  Result Value Ref Range Status   Specimen Description   Final    BLOOD RIGHT FOREARM Performed at [facility], [place] [address], [place], [place] [address]    Special Requests   Final    BOTTLES DRAWN AEROBIC AND ANAEROBIC Blood Culture results may not be optimal due to an inadequate volume of blood received in culture bottles Performed at [facility] [address] [place], [place] [address]    Culture   Final    NO GROWTH 5 DAYS Performed at [facility], [place] [address] [place], [place] [address]    Report Status 07/26/2018 FINAL  Final  Blood culture (routine x 2)     Status: None   Collection Time: 07/21/18  1:39 PM  Result Value Ref Range Status   Specimen Description   Final    LEFT ANTECUBITAL Performed at [facility] [address], [place], [place] [address]    Special Requests   Final    BOTTLES DRAWN AEROBIC AND ANAEROBIC Blood Culture adequate volume Performed at [facility] [address], [place], [place] [address]    Culture   Final    NO GROWTH 5 DAYS Performed at [facility], [place] [address], [place], [place] [address]    Report Status 07/26/2018 FINAL  Final         Radiology Studies: No results found.  Scheduled Meds: . acidophilus  1 capsule Oral Daily  . amLODipine  5 mg Oral QPM  . aspirin EC  81 mg Oral QHS  . calcium-vitamin D  1 tablet Oral BID  . cromolyn  1 drop Both Eyes QID  . docusate sodium  100 mg Oral BID  . enoxaparin (LOVENOX) injection  40 mg Subcutaneous Q24H  . famotidine  20 mg Oral BID  . ferrous sulfate  325 mg Oral BID WC  . guaiFENesin  600 mg Oral Q12H  . loratadine  10 mg Oral q morning - 10a  . losartan  100 mg Oral q morning - 10a  . multivitamin with minerals  1 tablet Oral Daily  . neomycin-bacitracin-polymyxin   Topical BID  . oxybutynin  5 mg Oral QHS  . pantoprazole  40 mg Oral Daily  . polyethylene glycol  17 g Oral Daily  . senna  2 tablet Oral Once  . tamsulosin  0.8 mg Oral q morning - 10a   Continuous Infusions:    LOS: 9 days       Alma Friendly, MD [facility] 07/30/2018, 5:31 PM

## 2018-07-30 NOTE — Plan of Care (Signed)
  Problem: Elimination: Goal: Will not experience complications related to bowel motility Outcome: Progressing Goal: Will not experience complications related to urinary retention Outcome: Progressing   Problem: Pain Managment: Goal: General experience of comfort will improve Outcome: Progressing   Problem: Safety: Goal: Ability to remain free from injury will improve Outcome: Progressing   

## 2018-07-31 ENCOUNTER — Ambulatory Visit: Payer: PPO | Admitting: Radiation Oncology

## 2018-07-31 DIAGNOSIS — N492 Inflammatory disorders of scrotum: Secondary | ICD-10-CM

## 2018-07-31 LAB — BASIC METABOLIC PANEL
Anion gap: 8 (ref 5–15)
BUN: 15 mg/dL (ref 8–23)
CO2: 26 mmol/L (ref 22–32)
Calcium: 10 mg/dL (ref 8.9–10.3)
Chloride: 97 mmol/L — ABNORMAL LOW (ref 98–111)
Creatinine, Ser: 0.69 mg/dL (ref 0.61–1.24)
GFR calc Af Amer: >60 mL/min (ref >60)
Glucose, Bld: 157 mg/dL — ABNORMAL HIGH (ref 70–99)
Potassium: 4.3 mmol/L (ref 3.5–5.1)
SODIUM: 131 mmol/L — AB (ref 135–145)

## 2018-07-31 LAB — CBC WITH DIFFERENTIAL/PLATELET
ABS IMMATURE GRANULOCYTES: 0.16 10*3/uL — AB (ref 0.00–0.07)
Basophils Absolute: 0.1 10*3/uL (ref 0.0–0.1)
Basophils Relative: 1 %
Eosinophils Absolute: 0.5 10*3/uL (ref 0.0–0.5)
Eosinophils Relative: 6 %
HCT: 28.8 % — ABNORMAL LOW (ref 39.0–52.0)
Hemoglobin: 9.2 g/dL — ABNORMAL LOW (ref 13.0–17.0)
Immature Granulocytes: 2 %
Lymphocytes Relative: 19 %
Lymphs Abs: 1.5 10*3/uL (ref 0.7–4.0)
MCH: 31.6 pg (ref 26.0–34.0)
MCHC: 31.9 g/dL (ref 30.0–36.0)
MCV: 99 fL (ref 80.0–100.0)
MONOS PCT: 19 %
Monocytes Absolute: 1.6 10*3/uL — ABNORMAL HIGH (ref 0.1–1.0)
NEUTROS ABS: 4.3 10*3/uL (ref 1.7–7.7)
Neutrophils Relative %: 53 %
Platelets: 565 10*3/uL — ABNORMAL HIGH (ref 150–400)
RBC: 2.91 MIL/uL — ABNORMAL LOW (ref 4.22–5.81)
RDW: 13.5 % (ref 11.5–15.5)
WBC: 8 10*3/uL (ref 4.0–10.5)
nRBC: 0 % (ref 0.0–0.2)

## 2018-07-31 NOTE — Progress Notes (Signed)
Palliative care progress note  I stopped by to check in on Sean Cooke this afternoon.  Wife not at bedside and he had friends visiting.   Reports he needs time to think about his options, and he would prefer follow-up as outpatient.  NO CHARGE NOTE  Micheline Rough, MD [place] Team [phone]

## 2018-07-31 NOTE — Consult Note (Signed)
Consultation Note Date: 07/31/2018   Patient Name: Sean Cooke  DOB: 1927/07/03  MRN: [id number]  Age / Sex: 84 y.o., male  PCP: [facility] Referring Physician: Elodia Florence., *  Reason for Consultation: Establishing goals of care  HPI/Patient Profile: 84 y.o. male with past medical history of MCI, recently diagnosed pancreatic cancer, history of lung cancer, BPH, chronic anemia admitted on 07/21/2018 with scrotal cellulitis and heart failure exacerbation.   Clinical Assessment and Goals of Care: I met today with Sean Cooke.  I introduced palliative care as specialized medical care for people living with serious illness. It focuses on providing relief from the symptoms and stress of a serious illness. The goal is to improve quality of life for both the patient and the family.  We discussed his clinical course over the last several weeks to months.  He reports that his main concerns remain his chronic back pain for which he is hopeful to get surgery as well as beginning treatment for his pancreatic cancer.  He reports having simulation done here in the hospital to initiate radiation therapy.  We discussed clinical course as well as wishes moving forward in regard to advanced directives.  Values and goals of care important to patient and family were attempted to be elicited.  He reports that he is focused right now on starting radiation and needs time to process and discuss with his family.  Plan is to transition to SNF for rehab, and I recommended palliative care to follow-up at SNF.  He is agreeable to this.  Questions and concerns addressed.   PMT will continue to support holistically.  SUMMARY OF RECOMMENDATIONS   - He is invested in plan to begin radiation for his pancreatic cancer.   - Plan to transition to SNF at end of hospitalization.   - He reports needing  time to consider conversation today.  Recommend palliative care to follow at SNF.  Please include this in the discharge summary if you agree it is appropriate.  Code Status/Advance Care Planning:  Full code  Additional Recommendations (Limitations, Scope, Preferences):  Full Scope Treatment  Psycho-social/Spiritual:   Desire for further Chaplaincy support:no  Additional Recommendations: Caregiving  Support/Resources  Prognosis:   Unable to determine  Discharge Planning: [place] for rehab with Palliative care service follow-up      Primary Diagnoses: Present on Admission: . Cellulitis . GERD . SPONDYLOSIS, LUMBAR . Essential hypertension . Leukocytosis . BPH (benign prostatic hyperplasia) . Primary adenocarcinoma of body of pancreas ([place])   I have reviewed the medical record, interviewed the patient and family, and examined the patient. The following aspects are pertinent.  Past Medical History:  Diagnosis Date  . Anemia   . Arthritis   . Benign localized prostatic hyperplasia with lower urinary tract symptoms (LUTS)   . Bilateral edema of lower extremity   . Borderline diabetes   . Chronic low back pain   . DDD (degenerative disc disease), lumbar   . Degenerative joint  disease   . Diverticulosis   . Duodenal diverticulum   . Encounter for antineoplastic chemotherapy 07/2015   lung cancer--- per pt completed chemo 06/ 2018  . Fatigue   . Generalized weakness   . GERD (gastroesophageal reflux disease)   . Hemorrhoids   . Herniated intervertebral disc of lumbar spine    L4-5  . Hiatal hernia   . History of adenomatous polyp of colon   . Hypertension   . Lumbar stenosis    L4-5  . 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 Pancreatic cancer [facility]) oncologist-  dr  Julien Nordmann   new dx 09/ 2019  . Poor dental hygiene   . Wears hearing aid in both ears    Social History   Socioeconomic History  . Marital status: Married    Spouse name: Not on file  . Number of children: 0  . Years of education: Not on file  . Highest education level: Not on file  Occupational History  . Occupation: retired  Scientific laboratory technician  . Financial resource strain: Not on file  . Food insecurity:    Worry: Not on file    Inability: Not on file  . Transportation needs:    Medical: No    Non-medical: No  Tobacco Use  . Smoking status: Former Smoker    Years: 30.00    Types: Cigarettes, Pipe    Last attempt to quit: 08/14/1988    Years since quitting: 29.9  . Smokeless tobacco: Never Used  . Tobacco comment: 06-13-2018 per pt quit cigarettes in 1960;  quit pipe 1990  Substance and Sexual Activity  . Alcohol use: Not Currently    Alcohol/week: 0.0 standard drinks  . Drug use: Never  . Sexual activity: Not on file  Lifestyle  . Physical activity:    Days per week: Not on file    Minutes per session: Not on file  . Stress: Not on file  Relationships  . Social connections:    Talks on phone: Not on file    Gets together: Not on file    Attends religious service: Not on file    Active member of club or organization: Not on file    Attends meetings of clubs or organizations: Not on file    Relationship status: Not on file  Other Topics Concern  . Not on file  Social History Narrative  . Not on file   Family History  Problem Relation Age of Onset  . Diabetes Father   . Colon cancer Neg Hx    Scheduled Meds: . acidophilus  1 capsule Oral Daily  . amLODipine  5 mg Oral QPM  . aspirin EC  81 mg Oral QHS  . calcium-vitamin D  1 tablet Oral BID  . cromolyn  1 drop Both Eyes QID  . docusate sodium  100 mg Oral BID  . enoxaparin (LOVENOX) injection  40 mg Subcutaneous Q24H  . famotidine  20 mg Oral BID  . ferrous sulfate  325 mg Oral BID WC  . guaiFENesin  600 mg  Oral Q12H  . loratadine  10 mg Oral q morning - 10a  . losartan  100 mg Oral q morning - 10a  . multivitamin with minerals  1 tablet Oral Daily  . neomycin-bacitracin-polymyxin   Topical BID  .  oxybutynin  5 mg Oral QHS  . pantoprazole  40 mg Oral Daily  . polyethylene glycol  17 g Oral Daily  . senna  2 tablet Oral Once  . tamsulosin  0.8 mg Oral q morning - 10a   Continuous Infusions: PRN Meds:.acetaminophen **OR** acetaminophen, alum & mag hydroxide-simeth, bisacodyl, HYDROmorphone, ibuprofen, loperamide, menthol-cetylpyridinium, neomycin-bacitracin-polymyxin, ondansetron **OR** ondansetron (ZOFRAN) IV, simethicone, traZODone Medications Prior to Admission:  Prior to Admission medications   Medication Sig Start Date End Date Taking? Authorizing Provider  amLODipine (NORVASC) 5 MG tablet Take 5 mg by mouth every evening.    Yes [provider]  antiseptic oral rinse (BIOTENE) LIQD 15 mLs by Mouth Rinse route 5 (five) times daily as needed for dry mouth.    Yes [provider]  aspirin 81 MG tablet Take 81 mg by mouth at bedtime.    Yes [provider]  betamethasone dipropionate (DIPROLENE) 0.05 % cream Apply 1 application topically daily as needed (irritation).    Yes [provider]  bisacodyl (DULCOLAX) 5 MG EC tablet Take 5 mg by mouth daily as needed for moderate constipation.   Yes [provider]  Calcium Carb-Cholecalciferol (CALCIUM-VITAMIN D) 500-200 MG-UNIT tablet Take 1 tablet by mouth 2 (two) times daily.    Yes [provider]  cromolyn (OPTICROM) 4 % ophthalmic solution Place 1 drop into both eyes 4 (four) times daily.  07/25/16  Yes [provider]  denosumab (PROLIA) 60 MG/ML SOLN injection Inject 60 mg into the skin every 6 (six) months. Administer in upper arm, thigh, or abdomen   Yes [provider]  famotidine (PEPCID) 20 MG tablet Take 20 mg by mouth 2 (two) times daily.   Yes [provider]  ferrous sulfate 325 (65 FE) MG tablet Take 325 mg by mouth 2 (two) times daily with a meal.    Yes [provider]  guaiFENesin (MUCINEX) 600 MG 12 hr tablet Take 600 mg by mouth every 12 (twelve) hours.    Yes [provider]  HYDROmorphone (DILAUDID) 2 MG tablet Take 1 tablet by mouth every 8 (eight) hours as needed for moderate pain.    Yes [provider]  ibuprofen (ADVIL,MOTRIN) 800 MG tablet Take 800 mg by mouth 3 (three) times daily after meals.    Yes [provider]  Lidocaine, Anorectal, 5 % CREA Apply externally to affected anorectal area up to 6 times/day 07/17/18  Yes Maczis, Barth Kirks, PA-C  lidocaine-prilocaine (EMLA) cream Apply 1 application topically as needed. Patient taking differently: Apply 1 application topically as needed (port access).  09/27/15  Yes Curt Bears, MD  [facility] ARTHRITIS PAIN RELIEF EX) Apply 1 patch topically daily as needed (pain).    Yes [provider]  loperamide (IMODIUM) 2 MG capsule Take 2 mg by mouth as needed for diarrhea or loose stools. Reported on 03/01/2016   Yes [provider]  loratadine (CLARITIN) 10 MG tablet Take 10 mg by mouth every morning.    Yes [provider]  losartan (COZAAR) 100 MG tablet Take 100 mg by mouth every morning.  03/29/16  Yes [provider]  Multiple Vitamin (MULTIVITAMIN) tablet Take 1 tablet by mouth daily.   Yes [provider]  Omega-3 Fatty Acids (FISH OIL) 1000 MG CAPS Take 1,000 mg by mouth daily.    Yes [provider]  oxybutynin (DITROPAN) 5 MG tablet Take 5 mg by mouth at bedtime.    Yes [provider]  pantoprazole (PROTONIX) 40 MG tablet Take 40 mg by mouth daily.    Yes [provider]  polyethylene glycol (MIRALAX / GLYCOLAX) packet Take 17 g by mouth daily as needed (constipation.).    Yes [provider]  Probiotic Product (ALIGN) 4 MG CAPS Take 4 mg by mouth daily.     Yes [provider]  saw palmetto 160 MG capsule Take 160 mg by mouth daily.   Yes [provider]  simethicone (MYLICON) 80 MG chewable tablet Chew 80 mg by mouth every 6 (six) hours as needed for flatulence.   Yes [provider]  Tamsulosin HCl (FLOMAX) 0.4 MG CAPS Take 0.4 mg by mouth every morning.    Yes [provider]  Turmeric 500 MG TABS Take 500 mg by mouth daily after supper.    Yes [provider]   Allergies  Allergen Reactions  . Benzonatate Other (See Comments)    D izziness  . Lyrica [Pregabalin] Diarrhea  . Gabapentin Other (See Comments)    Constipation   . Tizanidine Other (See Comments)    Unknown reaction - unable to remember  . Penicillins Rash    Has patient had a PCN reaction causing immediate rash, facial/tongue/throat swelling, SOB or lightheadedness with hypotension: unknown Has patient had a PCN reaction causing severe rash involving mucus membranes or skin necrosis: unknown Has patient had a PCN reaction that required hospitalization : unknown Has patient had a PCN reaction occurring within the last 10 years: no, childhood allergy If all of the above answers are "NO", then may proceed with Cephalosporin use.   . Tramadol Other (See Comments)    Constipation    Review of Systems  + back pain, fatigue  Physical Exam  General: Alert, awake, in no acute distress.  HEENT: No bruits, no goiter, no JVD Heart: Regular rate and rhythm. No murmur appreciated. Lungs: Good air movement, clear Abdomen: Soft, nontender, nondistended, positive bowel sounds.  Ext: No significant edema Skin: Warm and dry Neuro: Grossly intact, nonfocal.  Vital Signs: BP (!) 141/66 (BP Location: Left Arm)   Pulse 61   Temp 97.7 F (36.5 C) (Oral)   Resp 20   Ht 5' 8" (1.727 m)   Wt 66.1 kg   SpO2 96%   BMI 22.15 kg/m  Pain Scale: 0-10 POSS *See Group Information*: 1-Acceptable,Awake and alert Pain Score: 0-No pain   SpO2:  SpO2: 96 % O2 Device:SpO2: 96 % O2 Flow Rate: .   IO: Intake/output summary:   Intake/Output Summary (Last 24 hours) at 07/31/2018 0810 Last data filed at 07/31/2018 0600 Gross per 24 hour  Intake 360 ml  Output 1175 ml  Net -815 ml    LBM: Last BM Date: 07/29/18 Baseline Weight: Weight: 70 kg Most recent weight: Weight: 66.1 kg     Palliative Assessment/Data:   Flowsheet Rows     Most Recent Value  Intake Tab  Referral Department  Hospitalist  Unit at Time of Referral  -- [urology/telementry]  Palliative Care Primary Diagnosis  Cancer  Date Notified  07/30/18  Palliative Care Type  New Palliative care  Reason for referral  Clarify Goals of Care  Date of Admission  07/21/18  # of days IP prior to Palliative referral  9  Clinical Assessment  Psychosocial & Spiritual Assessment  Palliative Care Outcomes      Time Total: 60 minutes Greater than 50%  of this time was spent counseling and coordinating care related to  the above assessment and plan.  Signed by: Micheline Rough, MD   Please contact Palliative Medicine Team phone at [phone] for questions and concerns.  For individual provider: See Shea Evans

## 2018-07-31 NOTE — Progress Notes (Signed)
Palliative care brief note  Met with Sean Cooke today.  Recommend palliative care to follow at SNF on discharge.  Please include this on the discharge summary if you feel it is appropriate.  Micheline Rough, MD [place] Team [phone]

## 2018-07-31 NOTE — Progress Notes (Signed)
Occupational Therapy Treatment Patient Details Name: Sean Cooke MRN: [id number] DOB: 24-Mar-1927 Today's Date: 07/31/2018    History of present illness 84 Yo male admitted for scrotal cellulitis. H/O lung and pancreatic  cancer, DDD, BPH.   OT comments  Pt very agreeable to OOB with OT. Pt with increased I this OT session.  Pt had visitors at end of OT session in which he seemed to enjoy.   Follow Up Recommendations  SNF;Supervision/Assistance - 24 hour    Equipment Recommendations  None recommended by OT    Recommendations for Other Services      Precautions / Restrictions Precautions Precautions: Fall;Other (comment) Precaution Comments: urostomy bag       Mobility Bed Mobility   Bed Mobility: Supine to Sit     Supine to sit: Supervision        Transfers Overall transfer level: Needs assistance Equipment used: Rolling walker (2 wheeled) Transfers: Sit to/from [facility] Sit to Stand: Min assist Stand pivot transfers: Min guard       General transfer comment: cues for hand placement    Balance Overall balance assessment: Needs assistance Sitting-balance support: Feet unsupported Sitting balance-Leahy Scale: Good     Standing balance support: Bilateral upper extremity supported;During functional activity Standing balance-Leahy Scale: Fair                             ADL either performed or assessed with clinical judgement   ADL Overall ADL's : Needs assistance/impaired     Grooming: Min guard;Standing               Lower Body Dressing: Minimal assistance Lower Body Dressing Details (indicate cue type and reason): sitting- pt readjusted socks and needed OT to A him complete task Toilet Transfer: Min guard;Comfort height toilet;RW   Toileting- Clothing Manipulation and Hygiene: Minimal assistance;Sit to/from stand;Cueing for sequencing;Cueing for safety         General ADL Comments: Pt agreed to OOB and  then needed to go to bathroom. Pt did have a BM and needed min A with hygiene.                 Cognition Arousal/Alertness: Awake/alert Behavior During Therapy: WFL for tasks assessed/performed Overall Cognitive Status: Within Functional Limits for tasks assessed                                                     Pertinent Vitals/ Pain       Pain Score: 5  Pain Location: lower back area Pain Descriptors / Indicators: Discomfort;Sore Pain Intervention(s): Monitored during session;Repositioned         Frequency  Min 2X/week        Progress Toward Goals  OT Goals(current goals can now be found in the care plan section)  Progress towards OT goals: Progressing toward goals     Plan Discharge plan remains appropriate    Co-evaluation                 AM-PAC OT "6 Clicks" Daily Activity     Outcome Measure   Help from another person eating meals?: None Help from another person taking care of personal grooming?: A Little Help from another person toileting, which includes using toliet, bedpan, or urinal?: A Little  Help from another person bathing (including washing, rinsing, drying)?: A Little Help from another person to put on and taking off regular upper body clothing?: A Little Help from another person to put on and taking off regular lower body clothing?: A Little 6 Click Score: 19    End of Session Equipment Utilized During Treatment: Gait belt;Rolling walker  OT Visit Diagnosis: Other abnormalities of gait and mobility (R26.89);Muscle weakness (generalized) (M62.81);Pain Pain - part of body: (generalized)   Activity Tolerance Patient tolerated treatment well   Patient Left in bed;with call bell/phone within reach;with family/visitor present   Nurse Communication Mobility status        Time: 7615-1834 OT Time Calculation (min): 20 min  Charges: OT General Charges $OT Visit: 1 Visit OT Treatments $Self Care/Home  Management : 8-22 mins  Kari Baars, [place] [phone] Office- [place], Edwena Felty D 07/31/2018, 4:11 PM

## 2018-07-31 NOTE — Progress Notes (Signed)
PROGRESS NOTE    YANCY KNOBLE  OVF:[id number] DOB: Feb 16, 1927 DOA: 07/21/2018 PCP: [facility]   Brief Narrative:  Per previous PN Mr. Guderian is Zalika Tieszen 84 y.o. M with MCI, recently diagnosed pancreatic cancer, hx of lung cancer, BPH, and chronic anemia who presents with leg swelling and scrotal swelling, edema, erythema. Scrotal cellulitis improved with antibiotics. Patient to had simulation for radiation planning on 07/26/2018.  Patient's wife wanting him to be placed in [facility] facility as she is unable to care for him at home.  Social work, PT consulted.  Assessment & Plan:   Active Problems:   GERD   SPONDYLOSIS, LUMBAR   Leukocytosis   Essential hypertension   BPH (benign prostatic hyperplasia)   Primary adenocarcinoma of body of pancreas ([facility])   Cellulitis  Scrotal cellulitis Improved Currently afebrile, with resolved leukocytosis BC X 2 NGTD UA negative Ultrasound of scrotum with Doppler showed both testicles normal, no testicular mass torsion or orchitis.  Large bilateral hydrocele.  Diffuse scrotal wall thickening, with some degree of soft tissue edema Completed antibiotic therapy for cellulitis  Acute on chronic diastolic CHF Echo in Oct showed normal EF, grade 1 DD.   On admission BNP elevated, lower extremity edema.   Continue to hold Lasix  Chronic hyponatremia Na currently stable, AAO X 4 Patient has been adequately diuresed, no further need for Lasix Due to acute on chronic diastolic heart failure we will hold off on IV fluids  Hypertension Stable, follow Continue amlodipine, losartan  Chronic iron deficiency anemia Continue iron   BPH  Patient to be discharged home with Foley catheter per urology (note from 12/9).   Continue Flomax.  Will perform voiding trial as an outpatient  Pancreatic cancer Dx/d Sep 2019, not currently on therapy.   Had simulation on 07/26/18 for plans for radiation  Hx stage IIIB NSCLC RML, s/p Chemo  2018  [facility] Awaiting SNF  [facility] Palliative care consulted.  Recommending palliative care to follow.  DVT prophylaxis: lovenox Code Status: full code Family Communication: wife at bedside Disposition Plan: pending SNF placement   Consultants:   Urology  Palliative Care  Procedures:  LE [place] Summary:  Right: There is no evidence of deep vein thrombosis in the lower extremity. No cystic structure found in the popliteal fossa.  Left: There is no evidence of deep vein thrombosis in the lower extremity. No cystic structure found in the popliteal fossa.   Antimicrobials:  Anti-infectives (From admission, onward)   Start     Dose/Rate Route Frequency Ordered Stop   07/22/18 1200  cefTRIAXone (ROCEPHIN) 1 g in sodium chloride 0.9 % 100 mL IVPB  Status:  Discontinued     1 g 200 mL/hr over 30 Minutes Intravenous Every 24 hours 07/22/18 0739 07/30/18 1046   07/21/18 1600  ceFEPIme (MAXIPIME) 1 g in sodium chloride 0.9 % 100 mL IVPB  Status:  Discontinued     1 g 200 mL/hr over 30 Minutes Intravenous Every 12 hours 07/21/18 1458 07/22/18 0739   07/21/18 1315  ceFAZolin (ANCEF) IVPB 1 g/50 mL premix     1 g 100 mL/hr over 30 Minutes Intravenous  Once 07/21/18 1307 07/21/18 1424     Subjective: Evy Lutterman&Ox3 Denies complaint.  Objective: Vitals:   07/30/18 2020 07/31/18 0415 07/31/18 0421 07/31/18 1426  BP: 127/74 (!) 165/76 (!) 141/66 136/62  Pulse: 69 64 61 71  Resp: 20 20  (!) 30  Temp: 97.9 F (36.6 C) 97.7 F (36.5  C)  97.6 F (36.4 C)  TempSrc: Oral Oral  Oral  SpO2: 96% 96%  96%  Weight:  66.1 kg    Height:        Intake/Output Summary (Last 24 hours) at 07/31/2018 1701 Last data filed at 07/31/2018 1429 Gross per 24 hour  Intake 480 ml  Output 1750 ml  Net -1270 ml   Filed Weights   07/29/18 0435 07/30/18 0346 07/31/18 0415  Weight: 71.9 kg 65.6 kg 66.1 kg    Examination:  General exam: Appears calm and comfortable  Respiratory system: Clear to  auscultation. Respiratory effort normal. Cardiovascular system: S1 & S2 heard, RRR.  Gastrointestinal system: Abdomen is nondistended, soft and nontender.  GU: scrotum with minimal erythema, foley in place Central nervous system: Alert and oriented. No focal neurological deficits. Extremities: no lee Skin: No rashes, lesions or ulcers Psychiatry: Judgement and insight appear normal. Mood & affect appropriate.     Data Reviewed: I have personally reviewed following labs and imaging studies  CBC: Recent Labs  Lab 07/25/18 0444 07/26/18 0537 07/27/18 0605 07/28/18 0621 07/31/18 0619  WBC 19.7* 16.6* 13.9* 9.5 8.0  NEUTROABS  --   --   --  5.5 4.3  HGB 9.1* 9.4* 9.8* 9.2* 9.2*  HCT 27.4* 27.6* 30.0* 28.3* 28.8*  MCV 98.2 92.3 95.8 97.9 99.0  PLT 428* 470* 526* 534* 102*   Basic Metabolic Panel: Recent Labs  Lab 07/25/18 0444 07/26/18 0537 07/27/18 0605 07/28/18 0621 07/31/18 0619  NA 131* 130* 129* 128* 131*  K 4.0 4.3 4.4 4.2 4.3  CL 96* 97* 92* 93* 97*  CO2 26 25 30 26 26   GLUCOSE 140* 161* 179* 162* 157*  BUN 12 12 13 14 15   CREATININE 0.55* 0.55* 0.65 0.67 0.69  CALCIUM 9.6 9.5 9.8 9.6 10.0   GFR: Estimated Creatinine Clearance: 58.5 mL/min (by C-G formula based on SCr of 0.69 mg/dL). Liver Function Tests: No results for input(s): AST, ALT, ALKPHOS, BILITOT, PROT, ALBUMIN in the last 168 hours. No results for input(s): LIPASE, AMYLASE in the last 168 hours. No results for input(s): AMMONIA in the last 168 hours. Coagulation Profile: No results for input(s): INR, PROTIME in the last 168 hours. Cardiac Enzymes: No results for input(s): CKTOTAL, CKMB, CKMBINDEX, TROPONINI in the last 168 hours. BNP (last 3 results) No results for input(s): PROBNP in the last 8760 hours. HbA1C: No results for input(s): HGBA1C in the last 72 hours. CBG: No results for input(s): GLUCAP in the last 168 hours. Lipid Profile: No results for input(s): CHOL, HDL, LDLCALC, TRIG,  CHOLHDL, LDLDIRECT in the last 72 hours. Thyroid Function Tests: No results for input(s): TSH, T4TOTAL, FREET4, T3FREE, THYROIDAB in the last 72 hours. Anemia Panel: No results for input(s): VITAMINB12, FOLATE, FERRITIN, TIBC, IRON, RETICCTPCT in the last 72 hours. Sepsis Labs: No results for input(s): PROCALCITON, LATICACIDVEN in the last 168 hours.  No results found for this or any previous visit (from the past 240 hour(s)).       Radiology Studies: No results found.      Scheduled Meds: . acidophilus  1 capsule Oral Daily  . amLODipine  5 mg Oral QPM  . aspirin EC  81 mg Oral QHS  . calcium-vitamin D  1 tablet Oral BID  . cromolyn  1 drop Both Eyes QID  . docusate sodium  100 mg Oral BID  . enoxaparin (LOVENOX) injection  40 mg Subcutaneous Q24H  . famotidine  20 mg Oral BID  .  ferrous sulfate  325 mg Oral BID WC  . guaiFENesin  600 mg Oral Q12H  . loratadine  10 mg Oral q morning - 10a  . losartan  100 mg Oral q morning - 10a  . multivitamin with minerals  1 tablet Oral Daily  . neomycin-bacitracin-polymyxin   Topical BID  . oxybutynin  5 mg Oral QHS  . pantoprazole  40 mg Oral Daily  . polyethylene glycol  17 g Oral Daily  . senna  2 tablet Oral Once  . tamsulosin  0.8 mg Oral q morning - 10a   Continuous Infusions:   LOS: 10 days    Time spent: over 30 min    Fayrene Helper, MD [facility] Pager [phone]  If 7PM-7AM, please contact night-coverage [email or web address] Password [email or web address] 07/31/2018, 5:01 PM

## 2018-07-31 NOTE — Plan of Care (Signed)
  Problem: Skin Integrity: Goal: Risk for impaired skin integrity will decrease Outcome: Progressing   

## 2018-07-31 NOTE — Progress Notes (Signed)
Clinical Social Worker following patient for support and discharge needs. CSW met patient and wife at bedside to give them bed offers. Patient has several bed offers but patients wife stated she would like to go to [facility] since she has given them a 25 thousand dollar deposit for long term care placement. CSW stated she will reach out to [facility] to see if they have short term SNF beds.   CSW spoke with admission coordinator Claiborne Billings via phone. Claiborne Billings stated at this time facility does not have beds available until maybe Friday or Saturday. Claiborne Billings stated she will reach out to CSW later on this afternoon to verify if facility will get a rehab bed before the weekend.    Rhea Pink, MSW,  [place]

## 2018-08-01 ENCOUNTER — Ambulatory Visit: Payer: PPO | Admitting: Radiation Oncology

## 2018-08-01 LAB — COMPREHENSIVE METABOLIC PANEL
ALBUMIN: 2.9 g/dL — AB (ref 3.5–5.0)
ALT: 22 U/L (ref 0–44)
AST: 19 U/L (ref 15–41)
Alkaline Phosphatase: 53 U/L (ref 38–126)
Anion gap: 8 (ref 5–15)
BUN: 15 mg/dL (ref 8–23)
CO2: 25 mmol/L (ref 22–32)
Calcium: 10.3 mg/dL (ref 8.9–10.3)
Chloride: 97 mmol/L — ABNORMAL LOW (ref 98–111)
Creatinine, Ser: 0.65 mg/dL (ref 0.61–1.24)
GFR calc Af Amer: >60 mL/min (ref >60)
GFR calc non Af Amer: >60 mL/min (ref >60)
Glucose, Bld: 157 mg/dL — ABNORMAL HIGH (ref 70–99)
Potassium: 4.2 mmol/L (ref 3.5–5.1)
SODIUM: 130 mmol/L — AB (ref 135–145)
Total Bilirubin: 0.7 mg/dL (ref 0.3–1.2)
Total Protein: 5.5 g/dL — ABNORMAL LOW (ref 6.5–8.1)

## 2018-08-01 LAB — MAGNESIUM: Magnesium: 1.6 mg/dL — ABNORMAL LOW (ref 1.7–2.4)

## 2018-08-01 LAB — CBC
HCT: 27.4 % — ABNORMAL LOW (ref 39.0–52.0)
HEMOGLOBIN: 9 g/dL — AB (ref 13.0–17.0)
MCH: 32.3 pg (ref 26.0–34.0)
MCHC: 32.8 g/dL (ref 30.0–36.0)
MCV: 98.2 fL (ref 80.0–100.0)
Platelets: 529 10*3/uL — ABNORMAL HIGH (ref 150–400)
RBC: 2.79 MIL/uL — ABNORMAL LOW (ref 4.22–5.81)
RDW: 13.4 % (ref 11.5–15.5)
WBC: 9.2 10*3/uL (ref 4.0–10.5)
nRBC: 0.2 % (ref 0.0–0.2)

## 2018-08-01 MED ORDER — MAGNESIUM SULFATE 2 GM/50ML IV SOLN
2.0000 g | Freq: Once | INTRAVENOUS | Status: AC
  Administered 2018-08-01: 2 g via INTRAVENOUS
  Filled 2018-08-01: qty 50

## 2018-08-01 NOTE — Care Management Important Message (Signed)
Important Message  Patient Details  Name: Sean Cooke MRN: [id number] Date of Birth: 08-Mar-1927   Medicare Important Message Given:  Yes    Kerin Salen 08/01/2018, 12:42 [place] Message  Patient Details  Name: Sean Cooke MRN: [id number] Date of Birth: May 08, 1927   Medicare Important Message Given:  Yes    Kerin Salen 08/01/2018, 12:39 PM

## 2018-08-01 NOTE — Progress Notes (Signed)
PROGRESS NOTE    Sean Cooke  ZOX:[id number] DOB: 09-Mar-1927 DOA: 07/21/2018 PCP: [facility]   Brief Narrative:  Per previous PN Mr. Laseter is a 84 y.o. M with MCI, recently diagnosed pancreatic cancer, hx of lung cancer, BPH, and chronic anemia who presents with leg swelling and scrotal swelling, edema, erythema. Scrotal cellulitis improved with antibiotics. Patient to had simulation for radiation planning on 07/26/2018.  Patient's wife wanting him to be placed in a facility as she is unable to care for him at home.  Social work, PT consulted.  Assessment & Plan:   Active Problems:   GERD   SPONDYLOSIS, LUMBAR   Leukocytosis   Essential hypertension   BPH (benign prostatic hyperplasia)   Primary adenocarcinoma of body of pancreas (HCC)   Cellulitis   Cellulitis of scrotum  Scrotal cellulitis Stable Currently afebrile, with resolved leukocytosis BC X 2 NGTD UA negative Ultrasound of scrotum with Doppler showed both testicles normal, no testicular mass torsion or orchitis.  Large bilateral hydrocele.  Diffuse scrotal wall thickening, with some degree of soft tissue edema Completed antibiotic therapy for cellulitis  Acute on chronic diastolic CHF Echo in Oct showed normal EF, grade 1 DD.   On admission BNP elevated, lower extremity edema.   Continue to hold Lasix  Chronic hyponatremia Na currently stable, AAO X 4 Patient has been adequately diuresed, no further need for Lasix Due to acute on chronic diastolic heart failure we will hold off on IV fluids  Hypertension Stable, follow Continue amlodipine, losartan  Chronic iron deficiency anemia Continue iron   BPH  Patient to be discharged home with Foley catheter per urology (note from 12/9).   Continue Flomax.  Will perform voiding trial as an outpatient Appointment scheduled Friday 27th at 8:15 with Sande Rives  Pancreatic cancer Dx/d Sep 2019, not currently on therapy.   Had  simulation on 07/26/18 for plans for radiation  Hx stage IIIB NSCLC RML, s/p Chemo 2018  Debility Awaiting SNF  Hypomagnesemia: replace, follow  [facility] [facility] consulted.  Recommending palliative care to follow at SNF.    Peer to peer today with Dr. Amalia Hailey.  Concern that pt needs more custodial care.  He was approved for 7 days at [facility] after discussion.  Discussed with patient and his wife and discussed plan for 7 days at SNF and need to plan for once these days are up.  Planning to discharge to [facility] in AM.    DVT prophylaxis: lovenox Code Status: full code Family Communication: wife at bedside Disposition Plan: discharge to [facility] in AM.   Consultants:   Urology  Palliative Care  Procedures:  LE [place] Summary:  Right: There is no evidence of deep vein thrombosis in the lower extremity. No cystic structure found in the popliteal fossa.  Left: There is no evidence of deep vein thrombosis in the lower extremity. No cystic structure found in the popliteal fossa.   Antimicrobials:  Anti-infectives (From admission, onward)   Start     Dose/Rate Route Frequency Ordered Stop   07/22/18 1200  cefTRIAXone (ROCEPHIN) 1 g in sodium chloride 0.9 % 100 mL IVPB  Status:  Discontinued     1 g 200 mL/hr over 30 Minutes Intravenous Every 24 hours 07/22/18 0739 07/30/18 1046   07/21/18 1600  ceFEPIme (MAXIPIME) 1 g in sodium chloride 0.9 % 100 mL IVPB  Status:  Discontinued     1 g 200 mL/hr over 30 Minutes Intravenous Every 12  hours 07/21/18 1458 07/22/18 0739   07/21/18 1315  ceFAZolin (ANCEF) IVPB 1 g/50 mL premix     1 g 100 mL/hr over 30 Minutes Intravenous  Once 07/21/18 1307 07/21/18 1424     Subjective: A&Ox3 No complaints today Asking about the plan for eventual discharge Discussed with pt and wife at bedside.   Objective: Vitals:   07/31/18 2039 08/01/18 0515 08/01/18 0517 08/01/18 1309  BP: (!) 138/59 (!) 162/68 (!) 170/72 (!) 148/68  Pulse: 64 64  72    Resp: 20 (!) 24  20  Temp: 98.1 F (36.7 C) 97.7 F (36.5 C)  98.4 F (36.9 C)  TempSrc: Oral Oral  Oral  SpO2: 96% 98%  99%  Weight:  65.3 kg    Height:        Intake/Output Summary (Last 24 hours) at 08/01/2018 1554 Last data filed at 08/01/2018 1311 Gross per 24 hour  Intake 860 ml  Output 1375 ml  Net -515 ml   Filed Weights   07/30/18 0346 07/31/18 0415 08/01/18 0515  Weight: 65.6 kg 66.1 kg 65.3 kg    Examination:  General: No acute distress. Cardiovascular: Heart sounds show a regular rate, and rhythm Lungs: Clear to auscultation bilaterally Abdomen: Soft, nontender, nondistended GU: minimal scrotal erythema.  Foley in place. Neurological: Alert and oriented 3. Moves all extremities 4. Cranial nerves II through XII grossly intact. Skin: Warm and dry. No rashes or lesions. Extremities: No clubbing or cyanosis. No edema. Pedal pulses 2+. Psychiatric: Mood and affect are normal. Insight and judgment are appropriate.   Data Reviewed: I have personally reviewed following labs and imaging studies  CBC: Recent Labs  Lab 07/26/18 0537 07/27/18 0605 07/28/18 0621 07/31/18 0619 08/01/18 0549  WBC 16.6* 13.9* 9.5 8.0 9.2  NEUTROABS  --   --  5.5 4.3  --   HGB 9.4* 9.8* 9.2* 9.2* 9.0*  HCT 27.6* 30.0* 28.3* 28.8* 27.4*  MCV 92.3 95.8 97.9 99.0 98.2  PLT 470* 526* 534* 565* 383*   Basic Metabolic Panel: Recent Labs  Lab 07/26/18 0537 07/27/18 0605 07/28/18 0621 07/31/18 0619 08/01/18 0549  NA 130* 129* 128* 131* 130*  K 4.3 4.4 4.2 4.3 4.2  CL 97* 92* 93* 97* 97*  CO2 25 30 26 26 25   GLUCOSE 161* 179* 162* 157* 157*  BUN 12 13 14 15 15   CREATININE 0.55* 0.65 0.67 0.69 0.65  CALCIUM 9.5 9.8 9.6 10.0 10.3  MG  --   --   --   --  1.6*   GFR: Estimated Creatinine Clearance: 57.8 mL/min (by C-G formula based on SCr of 0.65 mg/dL). Liver Function Tests: Recent Labs  Lab 08/01/18 0549  AST 19  ALT 22  ALKPHOS 53  BILITOT 0.7  PROT 5.5*  ALBUMIN  2.9*   No results for input(s): LIPASE, AMYLASE in the last 168 hours. No results for input(s): AMMONIA in the last 168 hours. Coagulation Profile: No results for input(s): INR, PROTIME in the last 168 hours. Cardiac Enzymes: No results for input(s): CKTOTAL, CKMB, CKMBINDEX, TROPONINI in the last 168 hours. BNP (last 3 results) No results for input(s): PROBNP in the last 8760 hours. HbA1C: No results for input(s): HGBA1C in the last 72 hours. CBG: No results for input(s): GLUCAP in the last 168 hours. Lipid Profile: No results for input(s): CHOL, HDL, LDLCALC, TRIG, CHOLHDL, LDLDIRECT in the last 72 hours. Thyroid Function Tests: No results for input(s): TSH, T4TOTAL, FREET4, T3FREE, THYROIDAB in the  last 72 hours. Anemia Panel: No results for input(s): VITAMINB12, FOLATE, FERRITIN, TIBC, IRON, RETICCTPCT in the last 72 hours. Sepsis Labs: No results for input(s): PROCALCITON, LATICACIDVEN in the last 168 hours.  No results found for this or any previous visit (from the past 240 hour(s)).       Radiology Studies: No results found.      Scheduled Meds: . acidophilus  1 capsule Oral Daily  . amLODipine  5 mg Oral QPM  . aspirin EC  81 mg Oral QHS  . calcium-vitamin D  1 tablet Oral BID  . cromolyn  1 drop Both Eyes QID  . docusate sodium  100 mg Oral BID  . enoxaparin (LOVENOX) injection  40 mg Subcutaneous Q24H  . famotidine  20 mg Oral BID  . ferrous sulfate  325 mg Oral BID WC  . guaiFENesin  600 mg Oral Q12H  . loratadine  10 mg Oral q morning - 10a  . losartan  100 mg Oral q morning - 10a  . multivitamin with minerals  1 tablet Oral Daily  . neomycin-bacitracin-polymyxin   Topical BID  . oxybutynin  5 mg Oral QHS  . pantoprazole  40 mg Oral Daily  . polyethylene glycol  17 g Oral Daily  . senna  2 tablet Oral Once  . tamsulosin  0.8 mg Oral q morning - 10a   Continuous Infusions:   LOS: 11 days    Time spent: over 30 min    Fayrene Helper,  MD [facility] Pager [phone]  If 7PM-7AM, please contact night-coverage [email or web address] Password [facility] 08/01/2018, 3:54 PM

## 2018-08-01 NOTE — Progress Notes (Signed)
Physical Therapy Treatment Patient Details Name: Sean Cooke MRN: [id number] DOB: [birth year] Today's Date: 08/01/2018    History of Present Illness 84 Yo male admitted for scrotal cellulitis. H/O lung and pancreatic  cancer, DDD, BPH.    PT Comments    Pt with improved ambulation distance today. Pt still lacking in safety awareness and MCI noted during session. Pt very fatigued after ambulating, stating that he was unable to perform LE exercises. PT continuing to recommend SNF placement for rehabilitation due to mobility deficits, lack of safety awareness, and cognitive deficits. PT to continue to follow acutely.    Follow Up Recommendations  SNF;Supervision/Assistance - 24 hour     Equipment Recommendations  None recommended by PT    Recommendations for Other Services       Precautions / Restrictions Precautions Precautions: Fall Restrictions Weight Bearing Restrictions: No    Mobility  Bed Mobility Overal bed mobility: Needs Assistance             General bed mobility comments: Pt up in chair upon PT arrival to room   Transfers Overall transfer level: Needs assistance Equipment used: Rolling walker (2 wheeled) Transfers: Sit to/from Stand Sit to Stand: Min assist         General transfer comment: Min assist for steadying, verbal cuing for hand placement.   Ambulation/Gait Ambulation/Gait assistance: Min guard Gait Distance (Feet): 200 Feet Assistive device: Rolling walker (2 wheeled) Gait Pattern/deviations: Step-through pattern;Decreased stride length Gait velocity: normal, but lacking safety awareness   General Gait Details: Min guard for safety. Verbal cuing for erect posture, looking forward, placement in RW x3, safety with turning and backing up into recliner.    Stairs             Wheelchair Mobility    Modified Rankin (Stroke Patients Only)       Balance Overall balance assessment: Needs assistance Sitting-balance support:  Feet unsupported Sitting balance-Leahy Scale: Good     Standing balance support: Bilateral upper extremity supported;During functional activity Standing balance-Leahy Scale: Poor Standing balance comment: relies on RW for UE support and steadying                             Cognition Arousal/Alertness: Awake/alert Behavior During Therapy: WFL for tasks assessed/performed Overall Cognitive Status: History of cognitive impairments - at baseline Area of Impairment: Safety/judgement;Problem solving;Following commands                       Following Commands: Follows one step commands consistently Safety/Judgement: Decreased awareness of safety   Problem Solving: Decreased initiation;Difficulty sequencing;Requires tactile cues;Requires verbal cues General Comments: Pt with lack of safety awareness, required cuing from PT to correct. Pt required mod verbal cuing throughout ambulation for safety, positioning, and posture.       Exercises      General Comments        Pertinent Vitals/Pain Faces Pain Scale: Hurts a little bit Pain Location: points to abdomen Pain Descriptors / Indicators: Discomfort Pain Intervention(s): Limited activity within patient's tolerance;Repositioned;Monitored during session    Home Living                      Prior Function            PT Goals (current goals can now be found in the care plan section) Acute Rehab PT Goals PT Goal Formulation: With patient Time For Goal  Achievement: 08/06/18 Potential to Achieve Goals: Good Progress towards PT goals: Progressing toward goals    Frequency    Min 2X/week      PT Plan Current plan remains appropriate    Co-evaluation              AM-PAC PT "6 Clicks" Mobility   Outcome Measure  Help needed turning from your back to your side while in a flat bed without using bedrails?: A Little Help needed moving from lying on your back to sitting on the side of a flat  bed without using bedrails?: A Little Help needed moving to and from a bed to a chair (including a wheelchair)?: A Little Help needed standing up from a chair using your arms (e.g., wheelchair or bedside chair)?: A Little Help needed to walk in hospital room?: A Little Help needed climbing 3-5 steps with a railing? : A Lot 6 Click Score: 17    End of Session Equipment Utilized During Treatment: Gait belt Activity Tolerance: Patient limited by fatigue Patient left: in chair;with call bell/phone within reach;with family/visitor present Nurse Communication: Mobility status PT Visit Diagnosis: Other abnormalities of gait and mobility (R26.89);Muscle weakness (generalized) (M62.81)     Time: 9476-5465 PT Time Calculation (min) (ACUTE ONLY): 10 min  Charges:  $Gait Training: 8-22 mins                     Julien Girt, PT Acute Rehabilitation Services Pager [phone]  Office [phone]    Kathyjo Briere D Elonda Husky 08/01/2018, 3:19 PM

## 2018-08-01 NOTE — Progress Notes (Signed)
Clinical Social Worker following patient for support and discharge needs. CSW received phone call from [facility] and spoke to [place]. Colletta [place] stated that patient request for short term rehab has been denied. Colletta [place] stated patient will need a peer to peer between both physicians. CSW gave contact information to do a peer to peer to patients MD.     Rhea Pink, MSW,  [place]

## 2018-08-01 NOTE — Progress Notes (Signed)
[facility] Simulation and Treatment Planning Note   Name:  RILEY HALLUM MRN: [id number]   Date: 08/01/2018  DOB: 18-May-1927  Status:outpatient    DIAGNOSIS:    ICD-10-CM   1. Primary adenocarcinoma of body of pancreas ([place]) C25.1      CONSENT VERIFIED:yes   SET UP: Patient is setup supine   IMMOBILIZATION: The patient was immobilized using a Vac Loc bag and customized accuform device  NARRATIVE:The patient was brought to the [facility].  Identity was confirmed.  All relevant records and images related to the planned course of therapy were reviewed.  Then, the patient was positioned in a stable reproducible clinical set-up for radiation therapy. Abdominal compression was applied.  4D CT images were obtained and reproducible breathing pattern was confirmed. Free breathing CT images were obtained.  Skin markings were placed.  The CT images were loaded into the planning software where the target and avoidance structures were contoured.  The radiation prescription was entered and confirmed.    TREATMENT PLANNING NOTE:  Treatment planning then occurred. I have requested : MLC's, isodose plan, basic dose calculation.  3 dimensional simulation is performed and dose volume histogram of the gross tumor volume, planning tumor volume and criticial normal structures including the spinal cord and lungs were analyzed and requested.  Special treatment procedure was performed due to high dose per fraction.  The patient will be monitored for increased risk of toxicity.  Daily imaging using cone beam CT will be used for target localization.  I anticipate that the patient will receive 33 Gy in 5 fractions to target volume. Further adjustments will be made based on the planning process is necessary.  ------------------------------------------------  Jodelle Gross, MD, PhD

## 2018-08-01 NOTE — Plan of Care (Signed)
Patient tolerating diet without issue, up and down to chair, walked in hallways several times on 7 a to 7 p shift.  Wife at bedside, did speak with provider.

## 2018-08-01 NOTE — Progress Notes (Signed)
CSW following patient for support and discharge needs. Patient was approved for a 7 day stay at [facility] by insurance. Authorization number [id number] was given to [facility]. [facility] stated they will be able to take patient tomorrow for rehab. Patient and wife made aware of pending discharge scheduled for tomorrow.   Rhea Pink, MSW,  [place]

## 2018-08-02 ENCOUNTER — Ambulatory Visit: Payer: PPO | Admitting: Radiation Oncology

## 2018-08-02 DIAGNOSIS — I509 Heart failure, unspecified: Secondary | ICD-10-CM | POA: Diagnosis not present

## 2018-08-02 DIAGNOSIS — N5082 Scrotal pain: Secondary | ICD-10-CM | POA: Diagnosis not present

## 2018-08-02 DIAGNOSIS — M47816 Spondylosis without myelopathy or radiculopathy, lumbar region: Secondary | ICD-10-CM | POA: Diagnosis not present

## 2018-08-02 DIAGNOSIS — M47896 Other spondylosis, lumbar region: Secondary | ICD-10-CM | POA: Diagnosis not present

## 2018-08-02 DIAGNOSIS — Z85118 Personal history of other malignant neoplasm of bronchus and lung: Secondary | ICD-10-CM | POA: Diagnosis not present

## 2018-08-02 DIAGNOSIS — I5032 Chronic diastolic (congestive) heart failure: Secondary | ICD-10-CM | POA: Diagnosis not present

## 2018-08-02 DIAGNOSIS — Z111 Encounter for screening for respiratory tuberculosis: Secondary | ICD-10-CM | POA: Diagnosis not present

## 2018-08-02 DIAGNOSIS — L03818 Cellulitis of other sites: Secondary | ICD-10-CM | POA: Diagnosis not present

## 2018-08-02 DIAGNOSIS — M2550 Pain in unspecified joint: Secondary | ICD-10-CM | POA: Diagnosis not present

## 2018-08-02 DIAGNOSIS — N401 Enlarged prostate with lower urinary tract symptoms: Secondary | ICD-10-CM | POA: Diagnosis not present

## 2018-08-02 DIAGNOSIS — N492 Inflammatory disorders of scrotum: Secondary | ICD-10-CM | POA: Diagnosis not present

## 2018-08-02 DIAGNOSIS — R262 Difficulty in walking, not elsewhere classified: Secondary | ICD-10-CM | POA: Diagnosis not present

## 2018-08-02 DIAGNOSIS — Z7401 Bed confinement status: Secondary | ICD-10-CM | POA: Diagnosis not present

## 2018-08-02 DIAGNOSIS — K219 Gastro-esophageal reflux disease without esophagitis: Secondary | ICD-10-CM | POA: Diagnosis not present

## 2018-08-02 DIAGNOSIS — I10 Essential (primary) hypertension: Secondary | ICD-10-CM | POA: Diagnosis not present

## 2018-08-02 DIAGNOSIS — E871 Hypo-osmolality and hyponatremia: Secondary | ICD-10-CM | POA: Diagnosis not present

## 2018-08-02 DIAGNOSIS — K5909 Other constipation: Secondary | ICD-10-CM | POA: Diagnosis not present

## 2018-08-02 DIAGNOSIS — C251 Malignant neoplasm of body of pancreas: Secondary | ICD-10-CM | POA: Diagnosis not present

## 2018-08-02 DIAGNOSIS — R52 Pain, unspecified: Secondary | ICD-10-CM | POA: Diagnosis not present

## 2018-08-02 DIAGNOSIS — R682 Dry mouth, unspecified: Secondary | ICD-10-CM | POA: Diagnosis not present

## 2018-08-02 DIAGNOSIS — N430 Encysted hydrocele: Secondary | ICD-10-CM | POA: Diagnosis not present

## 2018-08-02 DIAGNOSIS — D649 Anemia, unspecified: Secondary | ICD-10-CM | POA: Diagnosis not present

## 2018-08-02 DIAGNOSIS — M6281 Muscle weakness (generalized): Secondary | ICD-10-CM | POA: Diagnosis not present

## 2018-08-02 DIAGNOSIS — E440 Moderate protein-calorie malnutrition: Secondary | ICD-10-CM | POA: Diagnosis not present

## 2018-08-02 DIAGNOSIS — H5789 Other specified disorders of eye and adnexa: Secondary | ICD-10-CM | POA: Diagnosis not present

## 2018-08-02 DIAGNOSIS — R338 Other retention of urine: Secondary | ICD-10-CM | POA: Diagnosis not present

## 2018-08-02 DIAGNOSIS — I5033 Acute on chronic diastolic (congestive) heart failure: Secondary | ICD-10-CM | POA: Diagnosis not present

## 2018-08-02 DIAGNOSIS — Z510 Encounter for antineoplastic radiation therapy: Secondary | ICD-10-CM | POA: Diagnosis not present

## 2018-08-02 LAB — COMPREHENSIVE METABOLIC PANEL
ALK PHOS: 53 U/L (ref 38–126)
ALT: 21 U/L (ref 0–44)
AST: 19 U/L (ref 15–41)
Albumin: 2.9 g/dL — ABNORMAL LOW (ref 3.5–5.0)
Anion gap: 10 (ref 5–15)
BUN: 13 mg/dL (ref 8–23)
CALCIUM: 10.1 mg/dL (ref 8.9–10.3)
CO2: 26 mmol/L (ref 22–32)
CREATININE: 0.66 mg/dL (ref 0.61–1.24)
Chloride: 96 mmol/L — ABNORMAL LOW (ref 98–111)
GFR calc Af Amer: >60 mL/min (ref >60)
GFR calc non Af Amer: >60 mL/min (ref >60)
Glucose, Bld: 147 mg/dL — ABNORMAL HIGH (ref 70–99)
Potassium: 4.2 mmol/L (ref 3.5–5.1)
Sodium: 132 mmol/L — ABNORMAL LOW (ref 135–145)
Total Bilirubin: 0.6 mg/dL (ref 0.3–1.2)
Total Protein: 5.5 g/dL — ABNORMAL LOW (ref 6.5–8.1)

## 2018-08-02 LAB — CBC
HCT: 28 % — ABNORMAL LOW (ref 39.0–52.0)
HEMOGLOBIN: 9.2 g/dL — AB (ref 13.0–17.0)
MCH: 31.9 pg (ref 26.0–34.0)
MCHC: 32.9 g/dL (ref 30.0–36.0)
MCV: 97.2 fL (ref 80.0–100.0)
Platelets: 628 10*3/uL — ABNORMAL HIGH (ref 150–400)
RBC: 2.88 MIL/uL — ABNORMAL LOW (ref 4.22–5.81)
RDW: 13.6 % (ref 11.5–15.5)
WBC: 8.1 10*3/uL (ref 4.0–10.5)
nRBC: 0 % (ref 0.0–0.2)

## 2018-08-02 LAB — MAGNESIUM: Magnesium: 1.7 mg/dL (ref 1.7–2.4)

## 2018-08-02 MED ORDER — TAMSULOSIN HCL 0.4 MG PO CAPS: 0.8000 mg | ORAL_CAPSULE | Freq: Every morning | ORAL | 0 refills | Status: AC

## 2018-08-02 MED ORDER — IBUPROFEN 800 MG PO TABS: 800.0000 mg | ORAL_TABLET | Freq: Three times a day (TID) | ORAL | 0 refills | Status: DC | PRN

## 2018-08-02 MED ORDER — HYDROMORPHONE HCL 2 MG PO TABS: 2.0000 mg | ORAL_TABLET | Freq: Three times a day (TID) | ORAL | 0 refills | Status: AC | PRN

## 2018-08-02 NOTE — Discharge Summary (Signed)
Physician Discharge Summary  Sean Cooke CLE:[id number] DOB: [birth year] DOA: 07/21/2018  PCP: Yellow Cooke date: 07/21/2018 Discharge date: 08/02/2018  Time spent: 35 minutes  Recommendations for Outpatient Follow-up:  1. Follow up outpatient CBC/CMP 2. Follow up urinary retention.  Pt has appointment on 12/27 with Sean Cooke at 8:15.  Oxybutynin was discontinued. 3. Follow up volume status.  Pt had some overload on presentation, improved at this point in time.  Lasix on hold. 4. Follow up with outpatient oncologist and radonc regarding pancreatic cancer 5. Please have palliative care follow at SNF 6. Pt likely to continue to have care needs after SNF stay, continue to work with social work and care management to assist wife in making plans for his care after SNF  Discharge Diagnoses:  Active Problems:   GERD   SPONDYLOSIS, LUMBAR   Leukocytosis   Essential hypertension   BPH (benign prostatic hyperplasia)   Primary adenocarcinoma of body of pancreas ([place])   Cellulitis   Cellulitis of scrotum   Discharge Condition: stable  Diet recommendation: heart healthy  Filed Weights   07/31/18 0415 08/01/18 0515 08/02/18 0546  Weight: 66.1 kg 65.3 kg 66.2 kg    History of present illness:  Per previous PN Sean Cooke is a30 y.o.M with M[employer], recently diagnosed pancreatic cancer, hx of lung cancer, BPH, and chronic anemia who presents with leg swelling and scrotal swelling, edema, erythema. Scrotal cellulitis improved with antibiotics. Patient to had simulation for radiation planning on 07/26/2018. Patient's wife wanting him to be placed in Sean [facility]oke facility as she is unable to care for him at home. Social work, PT consulted.  He was admitted for sepsis due to scrotal cellulitis.  This was complicated by urinary retention.  He was seen by Cooke and had Sean Cooke foley placed.  His cellulitis improved with antibiotics.  He was overloaded and treated with lasix  for this.  He'll need outpatient follow up with Cooke for indwelling foley catheter.  He also needs to follow up with oncology and radiation oncology regarding his pancreatic cancer.  Hospital Course:  Scrotal cellulitis Stable and improved Currently afebrile, with resolved leukocytosis BC X 2 NGTD UA negative Ultrasound of scrotum with Doppler showed both testicles normal, no testicular mass torsion or orchitis. Large bilateral hydrocele. Diffuse scrotal wall thickening, with some degree of soft tissue edema Completed antibiotic therapy for cellulitis  Acute on chronic diastolic CHF Echo in Oct showed normal EF, grade 1 DD.  On admission BNP elevated, lower extremity edema.  Patient is now euvolemic, continue to follow volume status as outpatient  Chronic hyponatremia Na currently stable, AAO X 4 Patient has been adequately diuresed, no further need for Lasix Due to acute on chronic diastolic heart failure we will hold off on IV fluids  Hypertension Stable, follow Continue amlodipine, losartan  Chronic iron deficiency anemia Continue iron   BPH  Patient to be discharged home with Foley catheter per [facility] (note from 12/12).  Continue Flomax (increased to 0.8 mg). Oxybutinin discontinued.  Will need to follow up with Cooke for outpatient voiding trial.  Appointment scheduled Friday 27th at 8:15 with Sean Cooke  Pancreatic cancer Dx/d Sep 2019, not currently on therapy.  Had simulation on 07/26/18 for plans for radiation  Hx stage IIIB NSCLC RML, s/p Chemo 2018  Debility Awaiting SNF  Hypomagnesemia: replace, follow  Sean Cooke Palliative care consulted.  Recommending palliative care to follow at SNF.    Peer to peer 12/19 with  Sean Cooke.  Concern that pt needs more custodial care.  He was approved for 7 days at [facility] after discussion.  Discussed with patient and his wife and discussed plan for 7 days at SNF and need to plan for once these days are up.   Planning to discharge to [facility] today.    Procedures: LE [place] LE [place] Summary:  Right: There is no evidence of deep vein thrombosis in the lower extremity. No cystic structure found in the popliteal fossa.  Left: There is no evidence of deep vein thrombosis in the lower extremity. No cystic structure found in the popliteal fossa.   Consultations:  [facility]  Palliative care  Discharge Exam: Vitals:   08/01/18 2112 08/02/18 0546  BP: 140/63 (!) 148/63  Pulse: 62 65  Resp: 18 20  Temp: 98.7 F (37.1 C) 97.9 F (36.6 C)  SpO2: 98% 96%   Feels well.  Ready to go. No concerns.  General: No acute distress. Cardiovascular: Heart sounds show Sean Cooke regular rate, and rhythm. Lungs: Clear to auscultation bilaterally  Abdomen: Soft, nontender, nondistended  Neurological: Alert and oriented 3. Moves all extremities 4. Cranial nerves II through XII grossly intact. Skin: Warm and dry. No rashes or lesions. Extremities: No clubbing or cyanosis. No edema.  Psychiatric: Mood and affect are normal. Insight and judgment are appropriate.  Discharge Instructions   Discharge Instructions    Call MD for:  difficulty breathing, headache or visual disturbances   Complete by:  As directed    Call MD for:  extreme fatigue   Complete by:  As directed    Call MD for:  hives   Complete by:  As directed    Call MD for:  persistant dizziness or light-headedness   Complete by:  As directed    Call MD for:  persistant nausea and vomiting   Complete by:  As directed    Call MD for:  redness, tenderness, or signs of infection (pain, swelling, redness, odor or green/yellow discharge around incision site)   Complete by:  As directed    Call MD for:  severe uncontrolled pain   Complete by:  As directed    Call MD for:  temperature >100.4   Complete by:  As directed    Diet - low sodium heart healthy   Complete by:  As directed    Discharge instructions   Complete by:  As directed    You were seen  for cellulitis (skin infection) of the scrotum.  You were also treated for heart failure with lasix (diuretics).  You've improved with antibiotics.    You had Sean Cooke foley placed for urinary retention.  You have Bebe Moncure follow up appointment on Friday, December 27th at 8:15 at [facility].  This is extremely important so that the foley catheter can be removed.  I stopped your oxybutynin because this can cause retention.  Please follow up with Dr. Julien Nordmann and Dr. Lisbeth Renshaw as an outpatient.  Return for new, recurrent, or worsening symptoms.  Please ask your PCP to request records from this hospitalization so they know what was done and what the next steps will be.   Increase activity slowly   Complete by:  As directed      Allergies as of 08/02/2018      Reactions   Benzonatate Other (See Comments)   D izziness   Lyrica [pregabalin] Diarrhea   Gabapentin Other (See Comments)   Constipation    Tizanidine Other (See Comments)  Unknown reaction - unable to remember   Penicillins Rash   Has patient had Nicholai Willette PCN reaction causing immediate rash, facial/tongue/throat swelling, SOB or lightheadedness with hypotension: unknown Has patient had Kayode Petion PCN reaction causing severe rash involving mucus membranes or skin necrosis: unknown Has patient had Nassir Neidert PCN reaction that required hospitalization : unknown Has patient had Earnie Rockhold PCN reaction occurring within the last 10 years: no, childhood allergy If all of the above answers are "NO", then may proceed with Cephalosporin use.   Tramadol Other (See Comments)   Constipation       Medication List    STOP taking these medications   fluconazole 200 MG tablet Commonly known as:  DIFLUCAN   oxybutynin 5 MG tablet Commonly known as:  DITROPAN     TAKE these medications   ALIGN 4 MG Caps Take 4 mg by mouth daily.   amLODipine 5 MG tablet Commonly known as:  NORVASC Take 5 mg by mouth every evening.   antiseptic oral rinse Liqd 15 mLs by Mouth Rinse route 5  (five) times daily as needed for dry mouth.   aspirin 81 MG tablet Take 81 mg by mouth at bedtime.   betamethasone dipropionate 0.05 % cream Commonly known as:  DIPROLENE Apply 1 application topically daily as needed (irritation).   bisacodyl 5 MG EC tablet Commonly known as:  DULCOLAX Take 5 mg by mouth daily as needed for moderate constipation.   calcium-vitamin D 500-200 MG-UNIT tablet Take 1 tablet by mouth 2 (two) times daily.   cromolyn 4 % ophthalmic solution Commonly known as:  OPTICROM Place 1 drop into both eyes 4 (four) times daily.   famotidine 20 MG tablet Commonly known as:  PEPCID Take 20 mg by mouth 2 (two) times daily.   ferrous sulfate 325 (65 FE) MG tablet Take 325 mg by mouth 2 (two) times daily with Karagan Lehr meal.   Fish Oil 1000 MG Caps Take 1,000 mg by mouth daily.   guaiFENesin 600 MG 12 hr tablet Commonly known as:  MUCINEX Take 600 mg by mouth every 12 (twelve) hours.   HYDROmorphone 2 MG tablet Commonly known as:  DILAUDID Take 1 tablet (2 mg total) by mouth every 8 (eight) hours as needed for up to 3 days for moderate pain.   ibuprofen 800 MG tablet Commonly known as:  ADVIL,MOTRIN Take 1 tablet (800 mg total) by mouth every 8 (eight) hours as needed. What changed:    when to take this  reasons to take this   Lidocaine (Anorectal) 5 % Crea Apply externally to affected anorectal area up to 6 times/day   lidocaine-prilocaine cream Commonly known as:  EMLA Apply 1 application topically as needed. What changed:  reasons to take this   loperamide 2 MG capsule Commonly known as:  IMODIUM Take 2 mg by mouth as needed for diarrhea or loose stools. Reported on 03/01/2016   loratadine 10 MG tablet Commonly known as:  CLARITIN Take 10 mg by mouth every morning.   losartan 100 MG tablet Commonly known as:  COZAAR Take 100 mg by mouth every morning.   multivitamin tablet Take 1 tablet by mouth daily.   pantoprazole 40 MG tablet Commonly  known as:  PROTONIX Take 40 mg by mouth daily.   polyethylene glycol packet Commonly known as:  MIRALAX / GLYCOLAX Take 17 g by mouth daily as needed (constipation.).   PROLIA 60 MG/ML Soln injection Generic drug:  denosumab Inject 60 mg into the skin every  6 (six) months. Administer in upper arm, thigh, or abdomen   SALONPAS ARTHRITIS PAIN RELIEF EX Apply 1 patch topically daily as needed (pain).   saw palmetto 160 MG capsule Take 160 mg by mouth daily.   simethicone 80 MG chewable tablet Commonly known as:  MYLICON Chew 80 mg by mouth every 6 (six) hours as needed for flatulence.   tamsulosin 0.4 MG Caps capsule Commonly known as:  FLOMAX Take 2 capsules (0.8 mg total) by mouth every morning. What changed:  how much to take   Turmeric 500 MG Tabs Take 500 mg by mouth daily after supper.      Allergies  Allergen Reactions  . Benzonatate Other (See Comments)    D izziness  . Lyrica [Pregabalin] Diarrhea  . Gabapentin Other (See Comments)    Constipation   . Tizanidine Other (See Comments)    Unknown reaction - unable to remember  . Penicillins Rash    Has patient had Raphaella Larkin PCN reaction causing immediate rash, facial/tongue/throat swelling, SOB or lightheadedness with hypotension: unknown Has patient had Marli Diego PCN reaction causing severe rash involving mucus membranes or skin necrosis: unknown Has patient had Sharia Averitt PCN reaction that required hospitalization : unknown Has patient had Danyl Deems PCN reaction occurring within the last 10 years: no, childhood allergy If all of the above answers are "NO", then may proceed with Cephalosporin use.   . Tramadol Other (See Comments)    Constipation    Follow-up Information    Call Kathie Rhodes, MD.   Specialty:  [facility] Why:  For an appointment in ~1 week when you get home. You have an appointment on 12/27 at 8:15. Contact information: [address] [place] [address] [phone]        [facility] Follow up.    Specialty:  Home Health Services Why:  [facility] nursing/physical therapy,occupational Visual merchandiser information: [place] [address] [place] Follow up.   Contact information: [place] [address] [phone]            The results of significant diagnostics from this hospitalization (including imaging, microbiology, ancillary and laboratory) are listed below for reference.    Significant Diagnostic Studies: Dg Chest 2 View  Result Date: 07/17/2018 CLINICAL DATA:  Constipation.  Painful stools.  Scrotal swelling. EXAM: CHEST - 2 VIEW COMPARISON:  CT of the chest April 16, 2018 FINDINGS: Air-filled bowel is under the right hemidiaphragm. No convincing evidence of free air. The right hemidiaphragm is elevated. The heart, hila, mediastinum are normal. No pneumothorax. No nodules or masses. Peniel Biel right [place] Fratto-Cath is in good position. IMPRESSION: No active cardiopulmonary disease. Electronically Signed   By: Dorise Bullion III M.D   On: 07/17/2018 14:09   Ct Head Wo Contrast  Result Date: 07/17/2018 CLINICAL DATA:  Constipation, painful stools, lower extremity swelling for years, fall yesterday, minor head trauma. EXAM: CT HEAD WITHOUT CONTRAST TECHNIQUE: Contiguous axial images were obtained from the base of the skull through the vertex without intravenous contrast. COMPARISON:  Head CT dated 11/07/2017 FINDINGS: Brain: Ventricles are stable in size and configuration. Mild generalized age related parenchymal volume loss with commensurate dilatation of the ventricles and sulci. Mild chronic small vessel ischemic changes within the periventricular white matter regions bilaterally. No mass, hemorrhage, edema or other evidence of acute parenchymal abnormality. No extra-axial hemorrhage. Vascular: Chronic calcified atherosclerotic changes of the large  vessels at the skull base. No  unexpected hyperdense vessel. Skull: Normal. Negative for fracture or focal lesion. Sinuses/Orbits: No acute finding. Other: None. IMPRESSION: 1. No acute findings. No intracranial mass, hemorrhage or edema. No skull fracture. 2. Mild chronic small vessel ischemic changes within the white matter. Electronically Signed   By: Franki Cabot M.D.   On: 07/17/2018 14:59   US Scrotum W/doppler  Result Date: 07/17/2018 CLINICAL DATA:  Swelling EXAM: SCROTAL ULTRASOUND DOPPLER ULTRASOUND OF THE TESTICLES TECHNIQUE: Complete ultrasound examination of the testicles, epididymis, and other scrotal structures was performed. Color and spectral Doppler ultrasound were also utilized to evaluate blood flow to the testicles. COMPARISON:  None. FINDINGS: Right testicle Measurements: 2.9 x 2.3 x 2.5 cm. No mass or microlithiasis visualized. Left testicle Measurements: 3 x 2.7 x 2.2 cm. No mass or microlithiasis visualized. Right epididymis:  Normal in size and appearance. Left epididymis:  Normal in size and appearance. Hydrocele:  Large hydroceles bilaterally. Varicocele:  None visualized. Pulsed Doppler interrogation of both testes demonstrates normal low resistance arterial and venous waveforms bilaterally. At least mild thickening of the bilateral scrotal walls, presumably due to soft tissue edema. No circumscribed mass or fluid collection within these superficial soft tissues. IMPRESSION: 1. Both testicles appear normal. No evidence of testicular mass. No evidence of testicular torsion or orchitis. 2. Large bilateral hydroceles. 3. Diffuse scrotal wall thickening, presumably some degree of soft tissue edema. Electronically Signed   By: Franki Cabot M.D.   On: 07/17/2018 14:57   Vas [place] Lower Extremity Venous (dvt) (mc And Wl 7a-7p)  Result Date: 07/17/2018  Lower Venous Study Indications: Swelling.  Performing Technologist: Oliver Hum RVT  Examination Guidelines: Nickole Adamek complete evaluation includes B-mode imaging, spectral  Doppler, color Doppler, and power Doppler as needed of all accessible portions of each vessel. Bilateral testing is considered an integral part of Clayten Allcock complete examination. Limited examinations for reoccurring indications may be performed as noted.  Right Venous Findings: +---------+---------------+---------+-----------+----------+-------+          CompressibilityPhasicitySpontaneityPropertiesSummary +---------+---------------+---------+-----------+----------+-------+ CFV      Full           Yes      Yes                          +---------+---------------+---------+-----------+----------+-------+ SFJ      Full                                                 +---------+---------------+---------+-----------+----------+-------+ FV Prox  Full                                                 +---------+---------------+---------+-----------+----------+-------+ FV Mid   Full                                                 +---------+---------------+---------+-----------+----------+-------+ FV DistalFull                                                 +---------+---------------+---------+-----------+----------+-------+  PFV      Full                                                 +---------+---------------+---------+-----------+----------+-------+ POP      Full           Yes      Yes                          +---------+---------------+---------+-----------+----------+-------+ PTV      Full                                                 +---------+---------------+---------+-----------+----------+-------+ PERO     Full                                                 +---------+---------------+---------+-----------+----------+-------+  Left Venous Findings: +---------+---------------+---------+-----------+----------+-------+          CompressibilityPhasicitySpontaneityPropertiesSummary +---------+---------------+---------+-----------+----------+-------+  CFV      Full           Yes      Yes                          +---------+---------------+---------+-----------+----------+-------+ SFJ      Full                                                 +---------+---------------+---------+-----------+----------+-------+ FV Prox  Full                                                 +---------+---------------+---------+-----------+----------+-------+ FV Mid   Full                                                 +---------+---------------+---------+-----------+----------+-------+ FV DistalFull                                                 +---------+---------------+---------+-----------+----------+-------+ PFV      Full                                                 +---------+---------------+---------+-----------+----------+-------+ POP      Full           Yes      Yes                          +---------+---------------+---------+-----------+----------+-------+  PTV      Full                                                 +---------+---------------+---------+-----------+----------+-------+ PERO     Full                                                 +---------+---------------+---------+-----------+----------+-------+    Summary: Right: There is no evidence of deep vein thrombosis in the lower extremity. No cystic structure found in the popliteal fossa. Left: There is no evidence of deep vein thrombosis in the lower extremity. No cystic structure found in the popliteal fossa.  *See table(s) above for measurements and observations. Electronically signed by Quay Burow MD on 07/17/2018 at 4:48:00 PM.    Final     Microbiology: No results found for this or any previous visit (from the past 240 hour(s)).   Labs: Basic Metabolic Panel: Recent Labs  Lab 07/27/18 0605 07/28/18 0621 07/31/18 0619 08/01/18 0549 08/02/18 0544  NA 129* 128* 131* 130* 132*  K 4.4 4.2 4.3 4.2 4.2  CL 92* 93* 97* 97* 96*   CO2 30 26 26 25 26   GLUCOSE 179* 162* 157* 157* 147*  BUN 13 14 15 15 13   CREATININE 0.65 0.67 0.69 0.65 0.66  CALCIUM 9.8 9.6 10.0 10.3 10.1  MG  --   --   --  1.6* 1.7   Liver Function Tests: Recent Labs  Lab 08/01/18 0549 08/02/18 0544  AST 19 19  ALT 22 21  ALKPHOS 53 53  BILITOT 0.7 0.6  PROT 5.5* 5.5*  ALBUMIN 2.9* 2.9*   No results for input(s): LIPASE, AMYLASE in the last 168 hours. No results for input(s): AMMONIA in the last 168 hours. CBC: Recent Labs  Lab 07/27/18 0605 07/28/18 0621 07/31/18 0619 08/01/18 0549 08/02/18 0544  WBC 13.9* 9.5 8.0 9.2 8.1  NEUTROABS  --  5.5 4.3  --   --   HGB 9.8* 9.2* 9.2* 9.0* 9.2*  HCT 30.0* 28.3* 28.8* 27.4* 28.0*  MCV 95.8 97.9 99.0 98.2 97.2  PLT 526* 534* 565* 529* 628*   Cardiac Enzymes: No results for input(s): CKTOTAL, CKMB, CKMBINDEX, TROPONINI in the last 168 hours. BNP: BNP (last 3 results) Recent Labs    07/17/18 1243  BNP 268.7*    ProBNP (last 3 results) No results for input(s): PROBNP in the last 8760 hours.  CBG: No results for input(s): GLUCAP in the last 168 hours.     Signed:  Fayrene Helper MD.  [facility] 08/02/2018, 8:37 AM

## 2018-08-02 NOTE — Progress Notes (Signed)
Clinical Social Worker facilitated patient discharge including contacting patient family and facility to confirm patient discharge plans.  Clinical information faxed to facility and family agreeable with plan.  CSW arranged ambulance transport via [place] to [facility] .  RN to call [phone] (rm# [phone]) please ask for Lelon Frohlich Soil scientist) for report prior to discharge. PTAR has been scheduled for 2:30pm pick as facility is unable to take patient until 3pm.  Clinical Social Worker will sign off for now as social work intervention is no longer needed. Please consult [place] again if new need arises.  Rhea Pink, MSW, [place]

## 2018-08-02 NOTE — Clinical Social Work Placement (Signed)
   CLINICAL SOCIAL WORK PLACEMENT  NOTE  Date:  08/02/2018  Patient Details  Name: Sean Cooke MRN: [id number] Date of Birth: [birth year]  Clinical Social Work is seeking post-discharge placement for this patient at the [place] level of care (*CSW will initial, date and re-position this form in  chart as items are completed):  Yes   Patient/family provided with [place] Work Department's list of facilities offering this level of care within the geographic area requested by the patient (or if unable, by the patient's family).  Yes   Patient/family informed of their freedom to choose among providers that offer the needed level of care, that participate in Medicare, Medicaid or managed care program needed by the patient, have an available bed and are willing to accept the patient.  Yes   Patient/family informed of [facility]'s ownership interest in [facility] and [facility], as well as of the fact that they are under no obligation to receive care at these facilities.  PASRR submitted to EDS on       PASRR number received on       Existing PASRR number confirmed on 08/01/18     FL2 transmitted to all facilities in geographic area requested by pt/family on 08/01/18     FL2 transmitted to all facilities within larger geographic area on       Patient informed that his/her managed care company has contracts with or will negotiate with certain facilities, including the following:            Patient/family informed of bed offers received.  Patient chooses bed at [facility]     Physician recommends and patient chooses bed at      Patient to be transferred to [facility] on 08/01/18.  Patient to be transferred to facility by ptar     Patient family notified on 08/02/18 of transfer.  Name of family member notified:  spoke to pt wife     PHYSICIAN       Additional Comment:     _______________________________________________ Wende Neighbors, LCSW 08/02/2018, 9:55 AM

## 2018-08-05 ENCOUNTER — Ambulatory Visit: Payer: PPO | Admitting: Radiation Oncology

## 2018-08-05 DIAGNOSIS — E871 Hypo-osmolality and hyponatremia: Secondary | ICD-10-CM | POA: Diagnosis not present

## 2018-08-05 DIAGNOSIS — N492 Inflammatory disorders of scrotum: Secondary | ICD-10-CM | POA: Diagnosis not present

## 2018-08-05 DIAGNOSIS — N401 Enlarged prostate with lower urinary tract symptoms: Secondary | ICD-10-CM | POA: Diagnosis not present

## 2018-08-05 DIAGNOSIS — I10 Essential (primary) hypertension: Secondary | ICD-10-CM | POA: Diagnosis not present

## 2018-08-05 DIAGNOSIS — M6281 Muscle weakness (generalized): Secondary | ICD-10-CM | POA: Diagnosis not present

## 2018-08-05 DIAGNOSIS — Z85118 Personal history of other malignant neoplasm of bronchus and lung: Secondary | ICD-10-CM | POA: Diagnosis not present

## 2018-08-05 DIAGNOSIS — D649 Anemia, unspecified: Secondary | ICD-10-CM | POA: Diagnosis not present

## 2018-08-05 DIAGNOSIS — R682 Dry mouth, unspecified: Secondary | ICD-10-CM | POA: Diagnosis not present

## 2018-08-05 DIAGNOSIS — C251 Malignant neoplasm of body of pancreas: Secondary | ICD-10-CM | POA: Diagnosis not present

## 2018-08-05 DIAGNOSIS — N5082 Scrotal pain: Secondary | ICD-10-CM | POA: Diagnosis not present

## 2018-08-05 DIAGNOSIS — I5032 Chronic diastolic (congestive) heart failure: Secondary | ICD-10-CM | POA: Diagnosis not present

## 2018-08-06 ENCOUNTER — Ambulatory Visit
Admission: RE | Admit: 2018-08-06 | Discharge: 2018-08-06 | Disposition: A | Discharge status: Home / Self Care | Payer: PPO | Source: Ambulatory Visit | Attending: Radiation Oncology | Admitting: Radiation Oncology

## 2018-08-06 DIAGNOSIS — R682 Dry mouth, unspecified: Secondary | ICD-10-CM | POA: Diagnosis not present

## 2018-08-06 DIAGNOSIS — C251 Malignant neoplasm of body of pancreas: Secondary | ICD-10-CM | POA: Diagnosis not present

## 2018-08-06 DIAGNOSIS — D649 Anemia, unspecified: Secondary | ICD-10-CM | POA: Diagnosis not present

## 2018-08-06 DIAGNOSIS — I10 Essential (primary) hypertension: Secondary | ICD-10-CM | POA: Diagnosis not present

## 2018-08-06 DIAGNOSIS — Z510 Encounter for antineoplastic radiation therapy: Secondary | ICD-10-CM | POA: Diagnosis not present

## 2018-08-06 DIAGNOSIS — H5789 Other specified disorders of eye and adnexa: Secondary | ICD-10-CM | POA: Diagnosis not present

## 2018-08-06 DIAGNOSIS — Z85118 Personal history of other malignant neoplasm of bronchus and lung: Secondary | ICD-10-CM | POA: Diagnosis not present

## 2018-08-06 DIAGNOSIS — L03818 Cellulitis of other sites: Secondary | ICD-10-CM | POA: Diagnosis not present

## 2018-08-06 DIAGNOSIS — E871 Hypo-osmolality and hyponatremia: Secondary | ICD-10-CM | POA: Diagnosis not present

## 2018-08-06 DIAGNOSIS — M6281 Muscle weakness (generalized): Secondary | ICD-10-CM | POA: Diagnosis not present

## 2018-08-06 DIAGNOSIS — N492 Inflammatory disorders of scrotum: Secondary | ICD-10-CM | POA: Diagnosis not present

## 2018-08-06 DIAGNOSIS — N401 Enlarged prostate with lower urinary tract symptoms: Secondary | ICD-10-CM | POA: Diagnosis not present

## 2018-08-06 DIAGNOSIS — I5033 Acute on chronic diastolic (congestive) heart failure: Secondary | ICD-10-CM | POA: Diagnosis not present

## 2018-08-07 ENCOUNTER — Ambulatory Visit: Payer: PPO | Admitting: Radiation Oncology

## 2018-08-08 ENCOUNTER — Ambulatory Visit
Admission: RE | Admit: 2018-08-08 | Discharge: 2018-08-08 | Disposition: A | Discharge status: Home / Self Care | Payer: PPO | Source: Ambulatory Visit | Attending: Radiation Oncology | Admitting: Radiation Oncology

## 2018-08-08 DIAGNOSIS — Z510 Encounter for antineoplastic radiation therapy: Secondary | ICD-10-CM | POA: Diagnosis not present

## 2018-08-09 ENCOUNTER — Ambulatory Visit: Payer: PPO | Admitting: Radiation Oncology

## 2018-08-09 DIAGNOSIS — N401 Enlarged prostate with lower urinary tract symptoms: Secondary | ICD-10-CM | POA: Diagnosis not present

## 2018-08-09 DIAGNOSIS — R338 Other retention of urine: Secondary | ICD-10-CM | POA: Diagnosis not present

## 2018-08-09 DIAGNOSIS — N430 Encysted hydrocele: Secondary | ICD-10-CM | POA: Diagnosis not present

## 2018-08-12 ENCOUNTER — Ambulatory Visit: Payer: PPO | Admitting: Radiation Oncology

## 2018-08-12 DIAGNOSIS — H5789 Other specified disorders of eye and adnexa: Secondary | ICD-10-CM | POA: Diagnosis not present

## 2018-08-12 DIAGNOSIS — I10 Essential (primary) hypertension: Secondary | ICD-10-CM | POA: Diagnosis not present

## 2018-08-12 DIAGNOSIS — M6281 Muscle weakness (generalized): Secondary | ICD-10-CM | POA: Diagnosis not present

## 2018-08-12 DIAGNOSIS — N401 Enlarged prostate with lower urinary tract symptoms: Secondary | ICD-10-CM | POA: Diagnosis not present

## 2018-08-12 DIAGNOSIS — R682 Dry mouth, unspecified: Secondary | ICD-10-CM | POA: Diagnosis not present

## 2018-08-12 DIAGNOSIS — L03818 Cellulitis of other sites: Secondary | ICD-10-CM | POA: Diagnosis not present

## 2018-08-12 DIAGNOSIS — D649 Anemia, unspecified: Secondary | ICD-10-CM | POA: Diagnosis not present

## 2018-08-12 DIAGNOSIS — I5033 Acute on chronic diastolic (congestive) heart failure: Secondary | ICD-10-CM | POA: Diagnosis not present

## 2018-08-12 DIAGNOSIS — C251 Malignant neoplasm of body of pancreas: Secondary | ICD-10-CM | POA: Diagnosis not present

## 2018-08-12 DIAGNOSIS — N492 Inflammatory disorders of scrotum: Secondary | ICD-10-CM | POA: Diagnosis not present

## 2018-08-12 DIAGNOSIS — Z85118 Personal history of other malignant neoplasm of bronchus and lung: Secondary | ICD-10-CM | POA: Diagnosis not present

## 2018-08-12 DIAGNOSIS — E871 Hypo-osmolality and hyponatremia: Secondary | ICD-10-CM | POA: Diagnosis not present

## 2018-08-13 ENCOUNTER — Ambulatory Visit: Payer: PPO | Admitting: Radiation Oncology

## 2018-08-13 ENCOUNTER — Ambulatory Visit
Admission: RE | Admit: 2018-08-13 | Discharge: 2018-08-13 | Disposition: A | Discharge status: Home / Self Care | Payer: PPO | Source: Ambulatory Visit | Attending: Radiation Oncology | Admitting: Radiation Oncology

## 2018-08-13 DIAGNOSIS — Z510 Encounter for antineoplastic radiation therapy: Secondary | ICD-10-CM | POA: Diagnosis not present

## 2018-08-14 DIAGNOSIS — L03818 Cellulitis of other sites: Secondary | ICD-10-CM | POA: Diagnosis not present

## 2018-08-14 DIAGNOSIS — K219 Gastro-esophageal reflux disease without esophagitis: Secondary | ICD-10-CM | POA: Diagnosis not present

## 2018-08-14 DIAGNOSIS — H5789 Other specified disorders of eye and adnexa: Secondary | ICD-10-CM | POA: Diagnosis not present

## 2018-08-14 DIAGNOSIS — I509 Heart failure, unspecified: Secondary | ICD-10-CM | POA: Diagnosis not present

## 2018-08-14 DIAGNOSIS — N492 Inflammatory disorders of scrotum: Secondary | ICD-10-CM | POA: Diagnosis not present

## 2018-08-14 DIAGNOSIS — E440 Moderate protein-calorie malnutrition: Secondary | ICD-10-CM | POA: Diagnosis not present

## 2018-08-14 DIAGNOSIS — I5032 Chronic diastolic (congestive) heart failure: Secondary | ICD-10-CM | POA: Diagnosis not present

## 2018-08-14 DIAGNOSIS — D649 Anemia, unspecified: Secondary | ICD-10-CM | POA: Diagnosis not present

## 2018-08-14 DIAGNOSIS — I5033 Acute on chronic diastolic (congestive) heart failure: Secondary | ICD-10-CM | POA: Diagnosis not present

## 2018-08-14 DIAGNOSIS — R262 Difficulty in walking, not elsewhere classified: Secondary | ICD-10-CM | POA: Diagnosis not present

## 2018-08-14 DIAGNOSIS — Z85118 Personal history of other malignant neoplasm of bronchus and lung: Secondary | ICD-10-CM | POA: Diagnosis not present

## 2018-08-14 DIAGNOSIS — M6281 Muscle weakness (generalized): Secondary | ICD-10-CM | POA: Diagnosis not present

## 2018-08-14 DIAGNOSIS — I10 Essential (primary) hypertension: Secondary | ICD-10-CM | POA: Diagnosis not present

## 2018-08-14 DIAGNOSIS — R682 Dry mouth, unspecified: Secondary | ICD-10-CM | POA: Diagnosis not present

## 2018-08-14 DIAGNOSIS — M47896 Other spondylosis, lumbar region: Secondary | ICD-10-CM | POA: Diagnosis not present

## 2018-08-14 DIAGNOSIS — N401 Enlarged prostate with lower urinary tract symptoms: Secondary | ICD-10-CM | POA: Diagnosis not present

## 2018-08-14 DIAGNOSIS — C251 Malignant neoplasm of body of pancreas: Secondary | ICD-10-CM | POA: Diagnosis not present

## 2018-08-14 DIAGNOSIS — M47816 Spondylosis without myelopathy or radiculopathy, lumbar region: Secondary | ICD-10-CM | POA: Diagnosis not present

## 2018-08-14 DIAGNOSIS — Z510 Encounter for antineoplastic radiation therapy: Secondary | ICD-10-CM | POA: Diagnosis not present

## 2018-08-14 DIAGNOSIS — Z111 Encounter for screening for respiratory tuberculosis: Secondary | ICD-10-CM | POA: Diagnosis not present

## 2018-08-14 DIAGNOSIS — E871 Hypo-osmolality and hyponatremia: Secondary | ICD-10-CM | POA: Diagnosis not present

## 2018-08-14 DIAGNOSIS — K5909 Other constipation: Secondary | ICD-10-CM | POA: Diagnosis not present

## 2018-08-14 DIAGNOSIS — R52 Pain, unspecified: Secondary | ICD-10-CM | POA: Diagnosis not present

## 2018-08-15 ENCOUNTER — Encounter: Payer: Self-pay | Admitting: Radiation Oncology

## 2018-08-15 ENCOUNTER — Ambulatory Visit
Admission: RE | Admit: 2018-08-15 | Discharge: 2018-08-15 | Disposition: A | Discharge status: Home / Self Care | Payer: PPO | Source: Ambulatory Visit | Attending: Radiation Oncology | Admitting: Radiation Oncology

## 2018-08-15 ENCOUNTER — Ambulatory Visit: Payer: PPO | Admitting: Radiation Oncology

## 2018-08-15 DIAGNOSIS — Z510 Encounter for antineoplastic radiation therapy: Secondary | ICD-10-CM | POA: Insufficient documentation

## 2018-08-15 DIAGNOSIS — C251 Malignant neoplasm of body of pancreas: Secondary | ICD-10-CM | POA: Insufficient documentation

## 2018-08-15 NOTE — Progress Notes (Signed)
The patient is currently being treated with stereotactic body radiotherapy over 5 fractions for his stage IB, cT2N0 adenocarcinoma of the pancreas.  His Case is complicated by ongoing anticoagulation, deconditioning, and severe back pain.  At the time of his original diagnosis he was in the process of getting ready to undergo back surgery with Dr. Gladstone Lighter.  This has had to be postponed, and the patient has been having significant difficulty with standing as a result, and has been in a rehab facility.  He comes today with his wife after his fourth of 5 treatments, to discuss long-term plans.  We discussed that I would call him in a month's time, and that he will need ongoing follow-up with Dr. Julien Nordmann to check CA-19-9 and CT imaging, as the patient does not have anything scheduled, we will make an appointment for him to be seen in the next month or 2 to reestablish himself.

## 2018-08-16 DIAGNOSIS — L03818 Cellulitis of other sites: Secondary | ICD-10-CM | POA: Diagnosis not present

## 2018-08-16 DIAGNOSIS — N401 Enlarged prostate with lower urinary tract symptoms: Secondary | ICD-10-CM | POA: Diagnosis not present

## 2018-08-16 DIAGNOSIS — D649 Anemia, unspecified: Secondary | ICD-10-CM | POA: Diagnosis not present

## 2018-08-16 DIAGNOSIS — Z85118 Personal history of other malignant neoplasm of bronchus and lung: Secondary | ICD-10-CM | POA: Diagnosis not present

## 2018-08-16 DIAGNOSIS — I5033 Acute on chronic diastolic (congestive) heart failure: Secondary | ICD-10-CM | POA: Diagnosis not present

## 2018-08-16 DIAGNOSIS — C251 Malignant neoplasm of body of pancreas: Secondary | ICD-10-CM | POA: Diagnosis not present

## 2018-08-16 DIAGNOSIS — R682 Dry mouth, unspecified: Secondary | ICD-10-CM | POA: Diagnosis not present

## 2018-08-16 DIAGNOSIS — E871 Hypo-osmolality and hyponatremia: Secondary | ICD-10-CM | POA: Diagnosis not present

## 2018-08-16 DIAGNOSIS — M6281 Muscle weakness (generalized): Secondary | ICD-10-CM | POA: Diagnosis not present

## 2018-08-16 DIAGNOSIS — N492 Inflammatory disorders of scrotum: Secondary | ICD-10-CM | POA: Diagnosis not present

## 2018-08-16 DIAGNOSIS — I10 Essential (primary) hypertension: Secondary | ICD-10-CM | POA: Diagnosis not present

## 2018-08-16 DIAGNOSIS — H5789 Other specified disorders of eye and adnexa: Secondary | ICD-10-CM | POA: Diagnosis not present

## 2018-08-19 ENCOUNTER — Encounter: Payer: Self-pay | Admitting: Radiation Oncology

## 2018-08-19 ENCOUNTER — Ambulatory Visit
Admission: RE | Admit: 2018-08-19 | Discharge: 2018-08-19 | Disposition: A | Discharge status: Home / Self Care | Payer: PPO | Source: Ambulatory Visit | Attending: Radiation Oncology | Admitting: Radiation Oncology

## 2018-08-19 ENCOUNTER — Ambulatory Visit: Payer: PPO | Admitting: Radiation Oncology

## 2018-08-19 DIAGNOSIS — C251 Malignant neoplasm of body of pancreas: Secondary | ICD-10-CM | POA: Diagnosis not present

## 2018-08-19 DIAGNOSIS — Z510 Encounter for antineoplastic radiation therapy: Secondary | ICD-10-CM | POA: Diagnosis not present

## 2018-08-20 ENCOUNTER — Ambulatory Visit: Payer: PPO | Admitting: Radiation Oncology

## 2018-08-20 ENCOUNTER — Other Ambulatory Visit: Payer: Self-pay | Admitting: *Deleted

## 2018-08-20 NOTE — Patient Outreach (Signed)
[facility]) Care Management  08/20/2018  ROXAS CLYMER September 30, 1926 [id number]   Facility site visit to [facility] to discuss patient's progress and plan to transition to home.  Met with Earnest Bailey, discharge planner for the facility.  Earnest Bailey states the discharge plan right now is for patient and spouse to go to an assisted living facility related to patient is not safe to return home.   Attempted to see patient but he was not in his room.   Will follow up at next facility site visit.   Rutherford Limerick RN, BSN [place] Acute Care Coordinator [phone]) Business Mobile [phone]) Toll free office

## 2018-08-21 ENCOUNTER — Ambulatory Visit: Payer: PPO | Admitting: Radiation Oncology

## 2018-08-21 DIAGNOSIS — Z85118 Personal history of other malignant neoplasm of bronchus and lung: Secondary | ICD-10-CM | POA: Diagnosis not present

## 2018-08-21 DIAGNOSIS — N492 Inflammatory disorders of scrotum: Secondary | ICD-10-CM | POA: Diagnosis not present

## 2018-08-21 DIAGNOSIS — I5033 Acute on chronic diastolic (congestive) heart failure: Secondary | ICD-10-CM | POA: Diagnosis not present

## 2018-08-21 DIAGNOSIS — E871 Hypo-osmolality and hyponatremia: Secondary | ICD-10-CM | POA: Diagnosis not present

## 2018-08-21 DIAGNOSIS — L03818 Cellulitis of other sites: Secondary | ICD-10-CM | POA: Diagnosis not present

## 2018-08-21 DIAGNOSIS — I10 Essential (primary) hypertension: Secondary | ICD-10-CM | POA: Diagnosis not present

## 2018-08-21 DIAGNOSIS — H5789 Other specified disorders of eye and adnexa: Secondary | ICD-10-CM | POA: Diagnosis not present

## 2018-08-21 DIAGNOSIS — C251 Malignant neoplasm of body of pancreas: Secondary | ICD-10-CM | POA: Diagnosis not present

## 2018-08-21 DIAGNOSIS — D649 Anemia, unspecified: Secondary | ICD-10-CM | POA: Diagnosis not present

## 2018-08-21 DIAGNOSIS — R682 Dry mouth, unspecified: Secondary | ICD-10-CM | POA: Diagnosis not present

## 2018-08-21 DIAGNOSIS — N401 Enlarged prostate with lower urinary tract symptoms: Secondary | ICD-10-CM | POA: Diagnosis not present

## 2018-08-21 DIAGNOSIS — M6281 Muscle weakness (generalized): Secondary | ICD-10-CM | POA: Diagnosis not present

## 2018-08-22 ENCOUNTER — Ambulatory Visit: Payer: PPO | Admitting: Radiation Oncology

## 2018-08-23 ENCOUNTER — Ambulatory Visit: Payer: PPO | Admitting: Radiation Oncology

## 2018-08-24 DIAGNOSIS — D508 Other iron deficiency anemias: Secondary | ICD-10-CM | POA: Diagnosis not present

## 2018-08-24 DIAGNOSIS — L89312 Pressure ulcer of right buttock, stage 2: Secondary | ICD-10-CM | POA: Diagnosis not present

## 2018-08-24 DIAGNOSIS — I5033 Acute on chronic diastolic (congestive) heart failure: Secondary | ICD-10-CM | POA: Diagnosis not present

## 2018-08-24 DIAGNOSIS — M47816 Spondylosis without myelopathy or radiculopathy, lumbar region: Secondary | ICD-10-CM | POA: Diagnosis not present

## 2018-08-24 DIAGNOSIS — N401 Enlarged prostate with lower urinary tract symptoms: Secondary | ICD-10-CM | POA: Diagnosis not present

## 2018-08-24 DIAGNOSIS — Z466 Encounter for fitting and adjustment of urinary device: Secondary | ICD-10-CM | POA: Diagnosis not present

## 2018-08-24 DIAGNOSIS — A419 Sepsis, unspecified organism: Secondary | ICD-10-CM | POA: Diagnosis not present

## 2018-08-24 DIAGNOSIS — R338 Other retention of urine: Secondary | ICD-10-CM | POA: Diagnosis not present

## 2018-08-24 DIAGNOSIS — C251 Malignant neoplasm of body of pancreas: Secondary | ICD-10-CM | POA: Diagnosis not present

## 2018-08-24 DIAGNOSIS — A4189 Other specified sepsis: Secondary | ICD-10-CM | POA: Diagnosis not present

## 2018-08-24 DIAGNOSIS — N492 Inflammatory disorders of scrotum: Secondary | ICD-10-CM | POA: Diagnosis not present

## 2018-08-24 DIAGNOSIS — L89322 Pressure ulcer of left buttock, stage 2: Secondary | ICD-10-CM | POA: Diagnosis not present

## 2018-08-24 DIAGNOSIS — I110 Hypertensive heart disease with heart failure: Secondary | ICD-10-CM | POA: Diagnosis not present

## 2018-08-27 DIAGNOSIS — L89309 Pressure ulcer of unspecified buttock, unspecified stage: Secondary | ICD-10-CM | POA: Diagnosis not present

## 2018-08-27 DIAGNOSIS — C3490 Malignant neoplasm of unspecified part of unspecified bronchus or lung: Secondary | ICD-10-CM | POA: Diagnosis not present

## 2018-08-27 DIAGNOSIS — N401 Enlarged prostate with lower urinary tract symptoms: Secondary | ICD-10-CM | POA: Diagnosis not present

## 2018-08-27 DIAGNOSIS — E46 Unspecified protein-calorie malnutrition: Secondary | ICD-10-CM | POA: Diagnosis not present

## 2018-08-27 DIAGNOSIS — I10 Essential (primary) hypertension: Secondary | ICD-10-CM | POA: Diagnosis not present

## 2018-08-27 DIAGNOSIS — R262 Difficulty in walking, not elsewhere classified: Secondary | ICD-10-CM | POA: Diagnosis not present

## 2018-08-27 DIAGNOSIS — M48061 Spinal stenosis, lumbar region without neurogenic claudication: Secondary | ICD-10-CM | POA: Diagnosis not present

## 2018-08-27 DIAGNOSIS — M810 Age-related osteoporosis without current pathological fracture: Secondary | ICD-10-CM | POA: Diagnosis not present

## 2018-08-27 DIAGNOSIS — N492 Inflammatory disorders of scrotum: Secondary | ICD-10-CM | POA: Diagnosis not present

## 2018-08-27 DIAGNOSIS — Z960 Presence of urogenital implants: Secondary | ICD-10-CM | POA: Diagnosis not present

## 2018-08-27 DIAGNOSIS — C259 Malignant neoplasm of pancreas, unspecified: Secondary | ICD-10-CM | POA: Diagnosis not present

## 2018-08-29 NOTE — Progress Notes (Signed)
  Radiation Oncology         [phone]) [phone] ________________________________  Name: Sean Cooke MRN: [id number]  Date: 08/19/2018  DOB: [birth year]  End of Treatment Note  Diagnosis:   85 y.o. male with Stage I, T2N0M0 adenocarcinoma of the pancreatic body   Indication for treatment:  Curative       Radiation treatment dates:   08/06/2018, 08/08/2018, 08/13/2018, 08/15/2018, 08/19/2018  Site/dose:   The tumor in the pancreas was treated with a course of stereotactic body radiation treatment. The patient received 33 Gy in 5 fractions at 6.6 Gy per fraction.  Beams/energy:   SBRT/SRT-3D // 10X-FFF Photon  Narrative: The patient tolerated radiation treatment relatively well.   The patient did not have any signs of acute toxicity during treatment.  Plan: The patient has completed radiation treatment. The patient will return to radiation oncology clinic for routine followup in one month. I advised the patient to call or return sooner if they have any questions or concerns related to their recovery or treatment.   ------------------------------------------------  Jodelle Gross, MD, PhD  This document serves as a record of services personally performed by Kyung Rudd, MD. It was created on his behalf by Rae Lips, a trained medical scribe. The creation of this record is based on the scribe's personal observations and the provider's statements to them. This document has been checked and approved by the attending provider.

## 2018-09-06 ENCOUNTER — Other Ambulatory Visit: Payer: Self-pay | Admitting: *Deleted

## 2018-09-06 NOTE — Patient Outreach (Signed)
[facility]) Care Management  09/06/2018  THATCHER DOBERSTEIN 1927-07-08 [id number]   Facility site visit at [facility], spoke with discharge [place].  Earnest Bailey stated patient discharged to [facility], which she states provides all patient needs.   No [facility] CM needs identified.  Rutherford Limerick RN, BSN [place] Acute Care Coordinator [phone]) Business Mobile [phone]) Toll free office

## 2018-09-12 DIAGNOSIS — L8992 Pressure ulcer of unspecified site, stage 2: Secondary | ICD-10-CM | POA: Diagnosis not present

## 2018-09-13 DIAGNOSIS — N401 Enlarged prostate with lower urinary tract symptoms: Secondary | ICD-10-CM | POA: Diagnosis not present

## 2018-09-13 DIAGNOSIS — R338 Other retention of urine: Secondary | ICD-10-CM | POA: Diagnosis not present

## 2018-09-16 DIAGNOSIS — L89322 Pressure ulcer of left buttock, stage 2: Secondary | ICD-10-CM | POA: Diagnosis not present

## 2018-09-16 DIAGNOSIS — A419 Sepsis, unspecified organism: Secondary | ICD-10-CM | POA: Diagnosis not present

## 2018-09-16 DIAGNOSIS — L89312 Pressure ulcer of right buttock, stage 2: Secondary | ICD-10-CM | POA: Diagnosis not present

## 2018-09-16 DIAGNOSIS — N492 Inflammatory disorders of scrotum: Secondary | ICD-10-CM | POA: Diagnosis not present

## 2018-09-16 DIAGNOSIS — Z466 Encounter for fitting and adjustment of urinary device: Secondary | ICD-10-CM | POA: Diagnosis not present

## 2018-09-17 ENCOUNTER — Inpatient Hospital Stay (HOSPITAL_COMMUNITY)
Admission: EM | Admit: 2018-09-17 | Discharge: 2018-09-27 | DRG: 871 | Payer: PPO | Attending: Internal Medicine | Admitting: Internal Medicine

## 2018-09-17 ENCOUNTER — Other Ambulatory Visit: Payer: Self-pay

## 2018-09-17 ENCOUNTER — Emergency Department (HOSPITAL_COMMUNITY): Payer: PPO

## 2018-09-17 ENCOUNTER — Encounter (HOSPITAL_COMMUNITY): Payer: Self-pay

## 2018-09-17 DIAGNOSIS — M5136 Other intervertebral disc degeneration, lumbar region: Secondary | ICD-10-CM | POA: Diagnosis not present

## 2018-09-17 DIAGNOSIS — N401 Enlarged prostate with lower urinary tract symptoms: Secondary | ICD-10-CM | POA: Diagnosis not present

## 2018-09-17 DIAGNOSIS — G8929 Other chronic pain: Secondary | ICD-10-CM | POA: Diagnosis not present

## 2018-09-17 DIAGNOSIS — R4020 Unspecified coma: Secondary | ICD-10-CM | POA: Diagnosis not present

## 2018-09-17 DIAGNOSIS — C259 Malignant neoplasm of pancreas, unspecified: Secondary | ICD-10-CM | POA: Diagnosis not present

## 2018-09-17 DIAGNOSIS — Z5111 Encounter for antineoplastic chemotherapy: Secondary | ICD-10-CM

## 2018-09-17 DIAGNOSIS — E1151 Type 2 diabetes mellitus with diabetic peripheral angiopathy without gangrene: Secondary | ICD-10-CM | POA: Diagnosis present

## 2018-09-17 DIAGNOSIS — Z6822 Body mass index (BMI) 22.0-22.9, adult: Secondary | ICD-10-CM

## 2018-09-17 DIAGNOSIS — D638 Anemia in other chronic diseases classified elsewhere: Secondary | ICD-10-CM | POA: Diagnosis not present

## 2018-09-17 DIAGNOSIS — L8990 Pressure ulcer of unspecified site, unspecified stage: Secondary | ICD-10-CM

## 2018-09-17 DIAGNOSIS — E871 Hypo-osmolality and hyponatremia: Secondary | ICD-10-CM

## 2018-09-17 DIAGNOSIS — L89152 Pressure ulcer of sacral region, stage 2: Secondary | ICD-10-CM | POA: Diagnosis not present

## 2018-09-17 DIAGNOSIS — Z791 Long term (current) use of non-steroidal anti-inflammatories (NSAID): Secondary | ICD-10-CM

## 2018-09-17 DIAGNOSIS — R7881 Bacteremia: Secondary | ICD-10-CM | POA: Diagnosis not present

## 2018-09-17 DIAGNOSIS — N400 Enlarged prostate without lower urinary tract symptoms: Secondary | ICD-10-CM | POA: Diagnosis present

## 2018-09-17 DIAGNOSIS — N171 Acute kidney failure with acute cortical necrosis: Secondary | ICD-10-CM | POA: Diagnosis not present

## 2018-09-17 DIAGNOSIS — C3411 Malignant neoplasm of upper lobe, right bronchus or lung: Secondary | ICD-10-CM | POA: Diagnosis not present

## 2018-09-17 DIAGNOSIS — R0689 Other abnormalities of breathing: Secondary | ICD-10-CM | POA: Diagnosis not present

## 2018-09-17 DIAGNOSIS — Z9841 Cataract extraction status, right eye: Secondary | ICD-10-CM

## 2018-09-17 DIAGNOSIS — Z85118 Personal history of other malignant neoplasm of bronchus and lung: Secondary | ICD-10-CM

## 2018-09-17 DIAGNOSIS — R410 Disorientation, unspecified: Secondary | ICD-10-CM | POA: Diagnosis not present

## 2018-09-17 DIAGNOSIS — E876 Hypokalemia: Secondary | ICD-10-CM | POA: Diagnosis present

## 2018-09-17 DIAGNOSIS — R000 Tachycardia, unspecified: Secondary | ICD-10-CM | POA: Diagnosis not present

## 2018-09-17 DIAGNOSIS — Z66 Do not resuscitate: Secondary | ICD-10-CM | POA: Diagnosis not present

## 2018-09-17 DIAGNOSIS — I110 Hypertensive heart disease with heart failure: Secondary | ICD-10-CM | POA: Diagnosis not present

## 2018-09-17 DIAGNOSIS — F329 Major depressive disorder, single episode, unspecified: Secondary | ICD-10-CM | POA: Diagnosis not present

## 2018-09-17 DIAGNOSIS — G4700 Insomnia, unspecified: Secondary | ICD-10-CM | POA: Diagnosis present

## 2018-09-17 DIAGNOSIS — N390 Urinary tract infection, site not specified: Secondary | ICD-10-CM | POA: Diagnosis not present

## 2018-09-17 DIAGNOSIS — Z7401 Bed confinement status: Secondary | ICD-10-CM | POA: Diagnosis not present

## 2018-09-17 DIAGNOSIS — R4182 Altered mental status, unspecified: Secondary | ICD-10-CM | POA: Diagnosis not present

## 2018-09-17 DIAGNOSIS — E119 Type 2 diabetes mellitus without complications: Secondary | ICD-10-CM | POA: Diagnosis not present

## 2018-09-17 DIAGNOSIS — R402411 Glasgow coma scale score 13-15, in the field [EMT or ambulance]: Secondary | ICD-10-CM | POA: Diagnosis not present

## 2018-09-17 DIAGNOSIS — M810 Age-related osteoporosis without current pathological fracture: Secondary | ICD-10-CM | POA: Diagnosis present

## 2018-09-17 DIAGNOSIS — R918 Other nonspecific abnormal finding of lung field: Secondary | ICD-10-CM | POA: Diagnosis not present

## 2018-09-17 DIAGNOSIS — M2550 Pain in unspecified joint: Secondary | ICD-10-CM | POA: Diagnosis not present

## 2018-09-17 DIAGNOSIS — R404 Transient alteration of awareness: Secondary | ICD-10-CM | POA: Diagnosis not present

## 2018-09-17 DIAGNOSIS — R338 Other retention of urine: Secondary | ICD-10-CM | POA: Diagnosis not present

## 2018-09-17 DIAGNOSIS — L98429 Non-pressure chronic ulcer of back with unspecified severity: Secondary | ICD-10-CM | POA: Diagnosis present

## 2018-09-17 DIAGNOSIS — A419 Sepsis, unspecified organism: Secondary | ICD-10-CM

## 2018-09-17 DIAGNOSIS — A498 Other bacterial infections of unspecified site: Secondary | ICD-10-CM | POA: Diagnosis not present

## 2018-09-17 DIAGNOSIS — Z86010 Personal history of colonic polyps: Secondary | ICD-10-CM

## 2018-09-17 DIAGNOSIS — N492 Inflammatory disorders of scrotum: Secondary | ICD-10-CM | POA: Diagnosis present

## 2018-09-17 DIAGNOSIS — Z7982 Long term (current) use of aspirin: Secondary | ICD-10-CM

## 2018-09-17 DIAGNOSIS — Z833 Family history of diabetes mellitus: Secondary | ICD-10-CM

## 2018-09-17 DIAGNOSIS — A4152 Sepsis due to Pseudomonas: Principal | ICD-10-CM | POA: Diagnosis present

## 2018-09-17 DIAGNOSIS — G92 Toxic encephalopathy: Secondary | ICD-10-CM | POA: Diagnosis present

## 2018-09-17 DIAGNOSIS — I5032 Chronic diastolic (congestive) heart failure: Secondary | ICD-10-CM | POA: Diagnosis not present

## 2018-09-17 DIAGNOSIS — E43 Unspecified severe protein-calorie malnutrition: Secondary | ICD-10-CM | POA: Diagnosis present

## 2018-09-17 DIAGNOSIS — B964 Proteus (mirabilis) (morganii) as the cause of diseases classified elsewhere: Secondary | ICD-10-CM | POA: Diagnosis present

## 2018-09-17 DIAGNOSIS — K219 Gastro-esophageal reflux disease without esophagitis: Secondary | ICD-10-CM | POA: Diagnosis present

## 2018-09-17 DIAGNOSIS — Z923 Personal history of irradiation: Secondary | ICD-10-CM

## 2018-09-17 DIAGNOSIS — Z794 Long term (current) use of insulin: Secondary | ICD-10-CM

## 2018-09-17 DIAGNOSIS — Z961 Presence of intraocular lens: Secondary | ICD-10-CM | POA: Diagnosis present

## 2018-09-17 DIAGNOSIS — Z8507 Personal history of malignant neoplasm of pancreas: Secondary | ICD-10-CM

## 2018-09-17 DIAGNOSIS — Z9842 Cataract extraction status, left eye: Secondary | ICD-10-CM

## 2018-09-17 DIAGNOSIS — Z79899 Other long term (current) drug therapy: Secondary | ICD-10-CM

## 2018-09-17 DIAGNOSIS — Z974 Presence of external hearing-aid: Secondary | ICD-10-CM

## 2018-09-17 DIAGNOSIS — Z9221 Personal history of antineoplastic chemotherapy: Secondary | ICD-10-CM

## 2018-09-17 DIAGNOSIS — R509 Fever, unspecified: Secondary | ICD-10-CM | POA: Diagnosis not present

## 2018-09-17 DIAGNOSIS — R456 Violent behavior: Secondary | ICD-10-CM | POA: Diagnosis not present

## 2018-09-17 DIAGNOSIS — R6520 Severe sepsis without septic shock: Secondary | ICD-10-CM | POA: Diagnosis not present

## 2018-09-17 LAB — URINALYSIS, ROUTINE W REFLEX MICROSCOPIC
Bilirubin Urine: NEGATIVE
Glucose, UA: NEGATIVE mg/dL
Ketones, ur: NEGATIVE mg/dL
Nitrite: NEGATIVE
Protein, ur: 100 mg/dL — AB
SPECIFIC GRAVITY, URINE: 1.011 (ref 1.005–1.030)
WBC, UA: >50 WBC/hpf — ABNORMAL HIGH (ref 0–5)
pH: 9 — ABNORMAL HIGH (ref 5.0–8.0)

## 2018-09-17 LAB — CBC WITH DIFFERENTIAL/PLATELET
Abs Immature Granulocytes: 0.34 10*3/uL — ABNORMAL HIGH (ref 0.00–0.07)
Basophils Absolute: 0 10*3/uL (ref 0.0–0.1)
Basophils Relative: 0 %
Eosinophils Absolute: 0 10*3/uL (ref 0.0–0.5)
Eosinophils Relative: 0 %
HCT: 34.5 % — ABNORMAL LOW (ref 39.0–52.0)
Hemoglobin: 10.7 g/dL — ABNORMAL LOW (ref 13.0–17.0)
Immature Granulocytes: 2 %
Lymphocytes Relative: 4 %
Lymphs Abs: 0.8 10*3/uL (ref 0.7–4.0)
MCH: 31.2 pg (ref 26.0–34.0)
MCHC: 31 g/dL (ref 30.0–36.0)
MCV: 100.6 fL — ABNORMAL HIGH (ref 80.0–100.0)
Monocytes Absolute: 1.1 10*3/uL — ABNORMAL HIGH (ref 0.1–1.0)
Monocytes Relative: 6 %
Neutro Abs: 16.4 10*3/uL — ABNORMAL HIGH (ref 1.7–7.7)
Neutrophils Relative %: 88 %
Platelets: 468 10*3/uL — ABNORMAL HIGH (ref 150–400)
RBC: 3.43 MIL/uL — ABNORMAL LOW (ref 4.22–5.81)
RDW: 15.6 % — ABNORMAL HIGH (ref 11.5–15.5)
WBC: 18.7 10*3/uL — ABNORMAL HIGH (ref 4.0–10.5)
nRBC: 0 % (ref 0.0–0.2)

## 2018-09-17 LAB — COMPREHENSIVE METABOLIC PANEL
ALT: 18 U/L (ref 0–44)
AST: 27 U/L (ref 15–41)
Albumin: 3.3 g/dL — ABNORMAL LOW (ref 3.5–5.0)
Alkaline Phosphatase: 86 U/L (ref 38–126)
Anion gap: 9 (ref 5–15)
BUN: 17 mg/dL (ref 8–23)
CO2: 28 mmol/L (ref 22–32)
Calcium: 11.6 mg/dL — ABNORMAL HIGH (ref 8.9–10.3)
Chloride: 97 mmol/L — ABNORMAL LOW (ref 98–111)
Creatinine, Ser: 0.94 mg/dL (ref 0.61–1.24)
GFR calc Af Amer: >60 mL/min (ref >60)
GFR calc non Af Amer: >60 mL/min (ref >60)
Glucose, Bld: 154 mg/dL — ABNORMAL HIGH (ref 70–99)
Potassium: 3.5 mmol/L (ref 3.5–5.1)
Sodium: 134 mmol/L — ABNORMAL LOW (ref 135–145)
Total Bilirubin: 0.8 mg/dL (ref 0.3–1.2)
Total Protein: 7.2 g/dL (ref 6.5–8.1)

## 2018-09-17 LAB — MRSA PCR SCREENING: MRSA by PCR: NEGATIVE

## 2018-09-17 LAB — PROTIME-INR
INR: 1.2
PROTHROMBIN TIME: 15.1 s (ref 11.4–15.2)

## 2018-09-17 LAB — INFLUENZA PANEL BY PCR (TYPE A & B)
Influenza A By PCR: NEGATIVE
Influenza B By PCR: NEGATIVE

## 2018-09-17 LAB — LACTIC ACID, PLASMA
LACTIC ACID, VENOUS: 2.1 mmol/L — AB (ref 0.5–1.9)
Lactic Acid, Venous: 1.4 mmol/L (ref 0.5–1.9)

## 2018-09-17 MED ORDER — ACETAMINOPHEN 650 MG RE SUPP
650.0000 mg | Freq: Once | RECTAL | Status: AC
  Administered 2018-09-17: 650 mg via RECTAL
  Filled 2018-09-17: qty 1

## 2018-09-17 MED ORDER — SODIUM CHLORIDE 0.9 % IV SOLN
Freq: Once | INTRAVENOUS | Status: AC
  Administered 2018-09-17: 14:00:00 via INTRAVENOUS

## 2018-09-17 MED ORDER — FAMOTIDINE IN NACL 20-0.9 MG/50ML-% IV SOLN
20.0000 mg | Freq: Once | INTRAVENOUS | Status: AC
  Administered 2018-09-17: 20 mg via INTRAVENOUS
  Filled 2018-09-17: qty 50

## 2018-09-17 MED ORDER — KETOROLAC TROMETHAMINE 30 MG/ML IJ SOLN
30.0000 mg | Freq: Four times a day (QID) | INTRAMUSCULAR | Status: AC | PRN
  Administered 2018-09-17 – 2018-09-19 (×3): 30 mg via INTRAVENOUS
  Filled 2018-09-17 (×3): qty 1

## 2018-09-17 MED ORDER — BIOTENE DRY MOUTH MT LIQD: 15.0000 mL | Freq: Every day | OROMUCOSAL | Status: DC | PRN

## 2018-09-17 MED ORDER — IBUPROFEN 200 MG PO TABS
400.0000 mg | ORAL_TABLET | Freq: Four times a day (QID) | ORAL | Status: DC | PRN
  Administered 2018-09-18 – 2018-09-27 (×9): 400 mg via ORAL
  Filled 2018-09-17 (×9): qty 2

## 2018-09-17 MED ORDER — ACETAMINOPHEN 650 MG RE SUPP: 650.0000 mg | Freq: Four times a day (QID) | RECTAL | Status: DC | PRN

## 2018-09-17 MED ORDER — ENOXAPARIN SODIUM 40 MG/0.4ML ~~LOC~~ SOLN
40.0000 mg | SUBCUTANEOUS | Status: DC
  Administered 2018-09-17 – 2018-09-26 (×10): 40 mg via SUBCUTANEOUS
  Filled 2018-09-17 (×10): qty 0.4

## 2018-09-17 MED ORDER — SODIUM CHLORIDE 0.9 % IV BOLUS
500.0000 mL | Freq: Once | INTRAVENOUS | Status: AC
  Administered 2018-09-17: 500 mL via INTRAVENOUS

## 2018-09-17 MED ORDER — SODIUM CHLORIDE 0.9 % IV SOLN
INTRAVENOUS | Status: DC
  Administered 2018-09-17 – 2018-09-20 (×6): via INTRAVENOUS

## 2018-09-17 MED ORDER — VANCOMYCIN HCL IN DEXTROSE 1-5 GM/200ML-% IV SOLN
1000.0000 mg | INTRAVENOUS | Status: DC
  Administered 2018-09-17: 1000 mg via INTRAVENOUS
  Filled 2018-09-17: qty 200

## 2018-09-17 MED ORDER — FAMOTIDINE 20 MG PO TABS
20.0000 mg | ORAL_TABLET | Freq: Two times a day (BID) | ORAL | Status: DC
  Administered 2018-09-18 – 2018-09-27 (×19): 20 mg via ORAL
  Filled 2018-09-17 (×20): qty 1

## 2018-09-17 MED ORDER — PANTOPRAZOLE SODIUM 40 MG PO TBEC
40.0000 mg | DELAYED_RELEASE_TABLET | Freq: Every day | ORAL | Status: DC
  Administered 2018-09-18 – 2018-09-27 (×10): 40 mg via ORAL
  Filled 2018-09-17 (×10): qty 1

## 2018-09-17 MED ORDER — AMLODIPINE BESYLATE 5 MG PO TABS
5.0000 mg | ORAL_TABLET | Freq: Every evening | ORAL | Status: DC
  Administered 2018-09-18 – 2018-09-27 (×10): 5 mg via ORAL
  Filled 2018-09-17 (×10): qty 1

## 2018-09-17 MED ORDER — SODIUM CHLORIDE 0.9 % IV SOLN
2.0000 g | Freq: Once | INTRAVENOUS | Status: AC
  Administered 2018-09-17: 2 g via INTRAVENOUS
  Filled 2018-09-17: qty 2

## 2018-09-17 MED ORDER — SODIUM CHLORIDE 0.9 % IV SOLN
INTRAVENOUS | Status: DC | PRN
  Administered 2018-09-17: 1000 mL via INTRAVENOUS

## 2018-09-17 MED ORDER — POLYETHYLENE GLYCOL 3350 17 G PO PACK
17.0000 g | PACK | Freq: Every day | ORAL | Status: DC | PRN
  Administered 2018-09-24 – 2018-09-27 (×2): 17 g via ORAL
  Filled 2018-09-17 (×2): qty 1

## 2018-09-17 MED ORDER — CROMOLYN SODIUM 4 % OP SOLN
1.0000 [drp] | Freq: Four times a day (QID) | OPHTHALMIC | Status: DC
  Administered 2018-09-17 – 2018-09-27 (×38): 1 [drp] via OPHTHALMIC
  Filled 2018-09-17: qty 10

## 2018-09-17 MED ORDER — ACETAMINOPHEN 325 MG PO TABS
650.0000 mg | ORAL_TABLET | Freq: Four times a day (QID) | ORAL | Status: DC | PRN
  Administered 2018-09-17 – 2018-09-19 (×3): 650 mg via ORAL
  Filled 2018-09-17 (×4): qty 2

## 2018-09-17 MED ORDER — SODIUM CHLORIDE 0.9 % IV SOLN
1.0000 g | Freq: Two times a day (BID) | INTRAVENOUS | Status: DC
  Administered 2018-09-17: 1 g via INTRAVENOUS
  Filled 2018-09-17: qty 1

## 2018-09-17 MED ORDER — METOPROLOL TARTRATE 5 MG/5ML IV SOLN
2.5000 mg | Freq: Four times a day (QID) | INTRAVENOUS | Status: DC | PRN
  Administered 2018-09-17: 2.5 mg via INTRAVENOUS
  Filled 2018-09-17: qty 5

## 2018-09-17 MED ORDER — VANCOMYCIN HCL IN DEXTROSE 1-5 GM/200ML-% IV SOLN
1000.0000 mg | Freq: Once | INTRAVENOUS | Status: AC
  Administered 2018-09-17: 1000 mg via INTRAVENOUS
  Filled 2018-09-17: qty 200

## 2018-09-17 MED ORDER — VANCOMYCIN HCL IN DEXTROSE 1-5 GM/200ML-% IV SOLN: 1000.0000 mg | Freq: Once | INTRAVENOUS | Status: DC

## 2018-09-17 MED ORDER — SODIUM CHLORIDE 0.9 % IV BOLUS
1000.0000 mL | Freq: Once | INTRAVENOUS | Status: AC
  Administered 2018-09-17: 1000 mL via INTRAVENOUS

## 2018-09-17 MED ORDER — SODIUM CHLORIDE 0.9 % IV SOLN: 2.0000 g | Freq: Once | INTRAVENOUS | Status: DC

## 2018-09-17 MED ORDER — TAMSULOSIN HCL 0.4 MG PO CAPS
0.4000 mg | ORAL_CAPSULE | Freq: Every day | ORAL | Status: DC
  Administered 2018-09-18 – 2018-09-26 (×9): 0.4 mg via ORAL
  Filled 2018-09-17 (×9): qty 1

## 2018-09-17 NOTE — ED Notes (Signed)
Wife at bedside.

## 2018-09-17 NOTE — H&P (Signed)
HPI  Sean Cooke MCN:[id number] DOB: [birth year] DOA: 09/17/2018  PCP: [facility]   Chief Complaint: Fever altered mental status  HPI:  85 year old male resident of [facility] living usually alert oriented Prior adenocarcinoma stage III lung Newly diagnosed stage I T2N0 pancreatic cancer status post XRT 1/6 supposed to return to oncology clinic 2/6 BPH Chronic anemia Diastolic heart failure He was hospitalized recently through 08/02/2017 with scrotal cellulitis and need an indwelling Foley catheter He is unable to give history but per report patient was confused and altered today with a fever of 104 degrees He is unable to tell any history his wife was here but has left the ED at present time  Per report Foley was having frank pus around it and was changed at the facility but no urine was cultured from this and he is not put out urine at this time   ED Course: Given IV fluid bolus then given saline at 1 5 cc an hour started on empiric vancomycin and ceftriaxone--- urine to be obtained  Emergency room work-up reveals sodium 134 BUN/creatinine 17/0.9 albumin 3.3 initial lactic acid 2.1 currently 1.4 WBC 18.7 hemoglobin 10.7 platelet 468 Chest x-ray negative CT head negative  Review of Systems:  Cannot obtain  Past Medical History:  Diagnosis Date  . Anemia   . Arthritis   . Benign localized prostatic hyperplasia with lower urinary tract symptoms (LUTS)   . Bilateral edema of lower extremity   . Borderline diabetes   . Chronic low back pain   . DDD (degenerative disc disease), lumbar   . Degenerative joint disease   . Diverticulosis   . Duodenal diverticulum   . Encounter for antineoplastic chemotherapy 07/2015   lung cancer--- per pt completed chemo 06/ 2018  . Fatigue   . Generalized weakness   . GERD (gastroesophageal reflux disease)   . Hemorrhoids   . Herniated intervertebral disc of lumbar spine    L4-5  . Hiatal hernia   .  History of adenomatous polyp of colon   . Hypertension   . Lumbar stenosis    L4-5  . Lung cancer [name]) oncologist-- dr Julien Nordmann   dx 11/ 2016--- Stage IIIB, (T1a N3 M0)-- non small cell adenocarcinoma of the right middle lobe, started systemic chemothearpy 07-2015 , and per pt completed chemo 06/ 2018  . Pancreatic cancer [facility]) oncologist-  dr Julien Nordmann   new dx 09/ 2019  . Poor dental hygiene   . Wears hearing aid in both ears     Past Surgical History:  Procedure Laterality Date  . CATARACT EXTRACTION W/ INTRAOCULAR LENS  IMPLANT, BILATERAL  2004  . ESOPHAGOGASTRODUODENOSCOPY (EGD) WITH PROPOFOL N/A 07/08/2018   Procedure: ESOPHAGOGASTRODUODENOSCOPY (EGD) WITH PROPOFOL;  Surgeon: Rush Landmark Telford Nab., MD;  Location: [facility] ENDOSCOPY;  Service: Gastroenterology;  Laterality: N/A;  . EUS N/A 05/16/2018   Procedure: UPPER ENDOSCOPIC ULTRASOUND (EUS) RADIAL;  Surgeon: Milus Banister, MD;  Location: [facility] ENDOSCOPY;  Service: Endoscopy;  Laterality: N/A;  . EUS N/A 07/08/2018   Procedure: UPPER ENDOSCOPIC ULTRASOUND (EUS) RADIAL;  Surgeon: Rush Landmark Telford Nab., MD;  Location: [facility]OS[facility];  Service: Gastroenterology;  Laterality: N/A;  . FINE NEEDLE ASPIRATION N/A 05/16/2018   Procedure: FINE NEEDLE ASPIRATION (FNA) LINEAR;  Surgeon: Milus Banister, MD;  Location: [facility] ENDOSCOPY;  Service: Endoscopy;  Laterality: N/A;  . INGUINAL HERNIA REPAIR  1980s   unilateral , pt unsure which side  . LAPAROSCOPIC CHOLECYSTECTOMY  07-02-2003  dr gerkin @[facility]   . SPLENECTOMY  10-11-2000   @[facility]    via exploratory laparotomy for blunt abdominal trauma  . TRANSURETHRAL RESECTION OF PROSTATE  1988     reports that he quit smoking about 30 years ago. His smoking use included cigarettes and pipe. He quit after 30.00 years of use. He has never used smokeless tobacco. He reports previous alcohol use. He reports that he does not use drugs. Mobility: Independent apparently at baseline  Allergies  Allergen  Reactions  . Benzonatate Other (See Comments)    D izziness  . Lyrica [Pregabalin] Diarrhea  . Gabapentin Other (See Comments)    Constipation   . Tizanidine Other (See Comments)    Unknown reaction - unable to remember  . Penicillins Rash    Has patient had a PCN reaction causing immediate rash, facial/tongue/throat swelling, SOB or lightheadedness with hypotension: unknown Has patient had a PCN reaction causing severe rash involving mucus membranes or skin necrosis: unknown Has patient had a PCN reaction that required hospitalization : unknown Has patient had a PCN reaction occurring within the last 10 years: no, childhood allergy If all of the above answers are "NO", then may proceed with Cephalosporin use.   . Tramadol Other (See Comments)    Constipation     Family History  Problem Relation Age of Onset  . Diabetes Father   . Colon cancer Neg Hx      Prior to Admission medications   Medication Sig Start Date End Date Taking? Authorizing Provider  Amino Acids-Protein Hydrolys (FEEDING SUPPLEMENT, PRO-STAT SUGAR FREE 64,) LIQD Take 30 mLs by mouth 2 (two) times daily.   Yes [provider]  amLODipine (NORVASC) 5 MG tablet Take 5 mg by mouth every evening.    Yes [provider]  antiseptic oral rinse (BIOTENE) LIQD 15 mLs by Mouth Rinse route 5 (five) times daily as needed for dry mouth.    Yes [provider]  aspirin 81 MG tablet Take 81 mg by mouth at bedtime.    Yes [provider]  bisacodyl (DULCOLAX) 5 MG EC tablet Take 5 mg by mouth daily as needed for moderate constipation.   Yes [provider]  Calcium Carb-Cholecalciferol (CALCIUM-VITAMIN D) 500-200 MG-UNIT tablet Take 1 tablet by mouth 2 (two) times daily.    Yes [provider]  cromolyn (OPTICROM) 4 % ophthalmic solution Place 1 drop into both eyes 4 (four) times daily.  07/25/16  Yes [provider]  denosumab (PROLIA) 60 MG/ML SOLN injection Inject  60 mg into the skin every 6 (six) months. Administer in upper arm, thigh, or abdomen   Yes [provider]  famotidine (PEPCID) 20 MG tablet Take 20 mg by mouth 2 (two) times daily.   Yes [provider]  guaiFENesin (MUCINEX) 600 MG 12 hr tablet Take 600 mg by mouth every 12 (twelve) hours.    Yes [provider]  ibuprofen (ADVIL,MOTRIN) 800 MG tablet Take 1 tablet (800 mg total) by mouth every 8 (eight) hours as needed. 08/02/18  Yes Elodia Florence., MD  insulin lispro (HUMALOG) 100 UNIT/ML injection Inject 2-4 Units into the skin 2 (two) times daily. Per sliding scale, if 181-300=2 units, 301+=4 units   Yes [provider]  loratadine (CLARITIN) 10 MG tablet Take 10 mg by mouth every morning.    Yes [provider]  losartan (COZAAR) 100 MG tablet Take 100 mg by mouth every morning.  03/29/16  Yes [provider]  magnesium hydroxide (MILK OF MAGNESIA) 400 MG/5ML suspension Take 30 mLs by mouth at bedtime.   Yes [provider]  Multiple Vitamin (MULTIVITAMIN) tablet Take 1 tablet by mouth daily.   Yes [provider]  Omega-3 Fatty Acids (FISH OIL) 1000 MG CAPS Take 1,000 mg by mouth daily.    Yes [provider]  pantoprazole (PROTONIX) 40 MG tablet Take 40 mg by mouth daily.    Yes [provider]  polyethylene glycol (MIRALAX / GLYCOLAX) packet Take 17 g by mouth daily as needed (constipation.).    Yes [provider]  Probiotic Product (ALIGN) 4 MG CAPS Take 4 mg by mouth daily.    Yes [provider]  simethicone (MYLICON) 80 MG chewable tablet Chew 80 mg by mouth every 6 (six) hours as needed for flatulence.   Yes [provider]  tamsulosin (FLOMAX) 0.4 MG CAPS capsule Take 0.4 mg by mouth at bedtime.   Yes [provider]  traZODone (DESYREL) 50 MG tablet Take 25 mg by mouth at bedtime.   Yes [provider]  Turmeric 500 MG TABS Take 500 mg by  mouth daily after supper.    Yes [provider]  Lidocaine, Anorectal, 5 % CREA Apply externally to affected anorectal area up to 6 times/day Patient not taking: Reported on 09/17/2018 07/17/18   Maczis, Barth Kirks, PA-C  lidocaine-prilocaine (EMLA) cream Apply 1 application topically as needed. Patient taking differently: Apply 1 application topically as needed (port access).  09/27/15   Curt Bears, MD    Physical Exam:  Vitals:   09/17/18 1300 09/17/18 1417  BP: (!) 151/93   Pulse: (!) 109   Resp: (!) 32   Temp:  (!) 101.3 F (38.5 C)  SpO2: 91%      Awake confused pleasant oriented able to follow commands however although does not know where he is and cannot orient to place person  EOMI NCAT no distress  CTA B  Abdomen slightly tender no rebound  No lower extremity edema  No pain in either extremity  Able to raise feet off bed at command smile is symmetric power in upper extremities is symmetric 5/5 reflexes 2/3 cannot assess sensory or cerebellar signs  I have personally reviewed following labs and imaging studies  Labs:   As above  Imaging studies:   As above  Medical tests:   EKG independently reviewed: n    Test discussed with performing physician:  n   Decision to obtain old records:   y   Review and summation of old records:   y   Principal Problem:   Sepsis ([place]) Active Problems:   BPH (benign prostatic hyperplasia)   Malignant neoplasm of right upper lobe of lung (HCC)   Assessment/Plan Sepsis likely secondary to lower urinary tract infection Start empiric cefepime vancomycin and follow blood cultures now off of vancomycin if possible-would hold Flagyl as we suspect this is urinary but has not been made clear Does not need stress dose steroids Tylenol for fever Lower rate of IV fluid from 125 to 75 cc/h as patient has propensity for volume overload Repeat CBC lactic acid and trend Toxic metabolic encephalopathy Given he  is confused would hold multiple agents as below-would allow only clear liquids at this time and if he becomes more oriented he can then discuss diet HTN Continue amlodipine 5 nightly holding Cozaar 100 in a.m. Insomnia/depression\ holding trazodone 25 at bedtime History of lung cancer NSCLC stage  IIIb status post chemotherapy 2016 stable at this time Recent diagnosis of pancreatic cancer status post XRT Supposed to follow-up 2/620 I will send in inbox message Dr. Lisbeth Renshaw Diabetes mellitus taking sliding scale insulin Hold for now resume a.m. if blood sugars above 180 Senile osteoporosis Holding Prolia at this time    Severity of Illness: The appropriate patient status for this patient is INPATIENT. Inpatient status is judged to be reasonable and necessary in order to provide the required intensity of service to ensure the patient's safety. The patient's presenting symptoms, physical exam findings, and initial radiographic and laboratory data in the context of their chronic comorbidities is felt to place them at high risk for further clinical deterioration. Furthermore, it is not anticipated that the patient will be medically stable for discharge from the hospital within 2 midnights of admission. The following factors support the patient status of inpatient.   " The patient's presenting symptoms include sepsis. " The worrisome physical exam findings include fever 104. " The initial radiographic and laboratory data are worrisome because of concern of UTI. " The chronic co-morbidities include multiple including many cancers.   * I certify that at the point of admission it is my clinical judgment that the patient will require inpatient hospital care spanning beyond 2 midnights from the point of admission due to high intensity of service, high risk for further deterioration and high frequency of surveillance required.*     DVT prophylaxis: Lovenox Code Status: DNR confirmed by ED physician  with wife however she is not available so this will have to be clarified Family Communication: No family present they went home Consults called: No  Time spent: 6 minutes  Verlon Au, MD  [facility] Direct contact: [phone] --[place] [place]  --[email or web address]; password [email or web address]  7PM-7AM contact night coverage as above  09/17/2018, 3:02 PM

## 2018-09-17 NOTE — ED Notes (Signed)
ED TO INPATIENT HANDOFF REPORT  Name/Age/Gender Sean Cooke 85 y.o. male  Code Status    Code Status Orders  (From admission, onward)         Start     Ordered   09/17/18 1524  Do not attempt resuscitation (DNR)  Continuous    Question Answer Comment  In the event of cardiac or respiratory ARREST Do not call a "code blue"   In the event of cardiac or respiratory ARREST Do not perform Intubation, CPR, defibrillation or ACLS   In the event of cardiac or respiratory ARREST Use medication by any route, position, wound care, and other measures to relive pain and suffering. May use oxygen, suction and manual treatment of airway obstruction as needed for comfort.      09/17/18 1523        Code Status History    Date Active Date Inactive Code Status Order ID Comments User Context   07/21/2018 1513 08/02/2018 1838 Full Code [id number]  Tawni Millers, MD Inpatient   04/24/2018 1059 04/25/2018 0352 Full Code [id number]  Logan Bores, MD HOV   10/04/2014 1116 10/07/2014 1851 Full Code [id number]  Donne Hazel, MD Inpatient    Advance Directive Documentation     Most Recent Value  Type of Advance Directive  Healthcare Power of [place], Living will  Pre-existing out of facility DNR order (yellow form or pink MOST form)  -  "MOST" Form in Place?  -      Home/SNF/Other Skilled nursing facility  Chief Complaint Sepsis  Level of Care/Admitting Diagnosis ED Disposition    ED Disposition Condition [place]: [facility] [[id number]]  Level of Care: Telemetry [5]  Admit to tele based on following criteria: Complex arrhythmia (Bradycardia/Tachycardia)  Diagnosis: Sepsis [facility]) [[id number]]  Admitting Physician: [place], [place]  Attending Physician: Nita Sells [phone]  Estimated length of stay: 3 - 4 days  Certification:: I certify this patient will need inpatient services for at least 2 midnights  PT Class (Do Not  Modify): Inpatient [101]  PT Acc Code (Do Not Modify): Private [1]       Medical History Past Medical History:  Diagnosis Date  . Anemia   . Arthritis   . Benign localized prostatic hyperplasia with lower urinary tract symptoms (LUTS)   . Bilateral edema of lower extremity   . Borderline diabetes   . Chronic low back pain   . DDD (degenerative disc disease), lumbar   . Degenerative joint disease   . Diverticulosis   . Duodenal diverticulum   . Encounter for antineoplastic chemotherapy 07/2015   lung cancer--- per pt completed chemo 06/ 2018  . Fatigue   . Generalized weakness   . GERD (gastroesophageal reflux disease)   . Hemorrhoids   . Herniated intervertebral disc of lumbar spine    L4-5  . Hiatal hernia   . History of adenomatous polyp of colon   . Hypertension   . Lumbar stenosis    L4-5  . 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 Pancreatic cancer [facility]) oncologist-  dr Julien Nordmann   new dx 09/ 2019  . Poor dental hygiene   . Wears hearing aid in both ears     Allergies Allergies  Allergen Reactions  . Benzonatate  Other (See Comments)    D izziness  . Lyrica [Pregabalin] Diarrhea  . Gabapentin Other (See Comments)    Constipation   . Tizanidine Other (See Comments)    Unknown reaction - unable to remember  . Penicillins Rash    Has patient had a PCN reaction causing immediate rash, facial/tongue/throat swelling, SOB or lightheadedness with hypotension: unknown Has patient had a PCN reaction causing severe rash involving mucus membranes or skin necrosis: unknown Has patient had a PCN reaction that required hospitalization : unknown Has patient had a PCN reaction occurring within the last 10 years: no, childhood allergy If all of the above answers are "NO", then may proceed with  Cephalosporin use.   . Tramadol Other (See Comments)    Constipation     IV Location/Drains/Wounds Patient Lines/Drains/Airways Status   Active Line/Drains/Airways    Name:   Placement date:   Placement time:   Site:   Days:   Implanted Port 08/30/15 Right Chest   08/30/15    -    Chest   1114   Peripheral IV 09/17/18 Left Antecubital   09/17/18    -    Antecubital   less than 1   Peripheral IV 09/17/18 Right;Posterior Forearm   09/17/18    1121    Forearm   less than 1   Urethral Catheter Placido Sou, RN Non-latex 16 Fr.   07/24/18    1000    Non-latex   28          Labs/Imaging Results for orders placed or performed during the hospital encounter of 09/17/18 (from the past 48 hour(s))  Lactic acid, plasma     Status: Abnormal   Collection Time: 09/17/18 11:25 AM  Result Value Ref Range   Lactic Acid, Venous 2.1 (HH) 0.5 - 1.9 mmol/L    Comment: CRITICAL RESULT CALLED TO, READ BACK BY AND VERIFIED WITHSharlyn Bologna RN AT 1229 09/17/18 MULLINS,T Performed at [facility], [place] [address], [place], [place] [address]   Comprehensive metabolic panel     Status: Abnormal   Collection Time: 09/17/18 11:49 AM  Result Value Ref Range   Sodium 134 (L) 135 - 145 mmol/L   Potassium 3.5 3.5 - 5.1 mmol/L   Chloride 97 (L) 98 - 111 mmol/L   CO2 28 22 - 32 mmol/L   Glucose, Bld 154 (H) 70 - 99 mg/dL   BUN 17 8 - 23 mg/dL   Creatinine, Ser 0.94 0.61 - 1.24 mg/dL   Calcium 11.6 (H) 8.9 - 10.3 mg/dL   Total Protein 7.2 6.5 - 8.1 g/dL   Albumin 3.3 (L) 3.5 - 5.0 g/dL   AST 27 15 - 41 U/L   ALT 18 0 - 44 U/L   Alkaline Phosphatase 86 38 - 126 U/L   Total Bilirubin 0.8 0.3 - 1.2 mg/dL   GFR calc non Af Amer >60 >60 mL/min   GFR calc Af Amer >60 >60 mL/min   Anion gap 9 5 - 15    Comment: Performed at [facility], [place] [address], [place],  [address]  CBC with Differential     Status: Abnormal   Collection Time: 09/17/18 11:49 AM  Result Value  Ref Range   WBC 18.7 (H) 4.0 - 10.5 K/uL   RBC 3.43 (L) 4.22 - 5.81 MIL/uL   Hemoglobin 10.7 (L) 13.0 - 17.0 g/dL   HCT 34.5 (L) 39.0 - 52.0 %   MCV 100.6 (H) 80.0 -  100.0 fL   MCH 31.2 26.0 - 34.0 pg   MCHC 31.0 30.0 - 36.0 g/dL   RDW 15.6 (H) 11.5 - 15.5 %   Platelets 468 (H) 150 - 400 K/uL   nRBC 0.0 0.0 - 0.2 %   Neutrophils Relative % 88 %   Neutro Abs 16.4 (H) 1.7 - 7.7 K/uL   Lymphocytes Relative 4 %   Lymphs Abs 0.8 0.7 - 4.0 K/uL   Monocytes Relative 6 %   Monocytes Absolute 1.1 (H) 0.1 - 1.0 K/uL   Eosinophils Relative 0 %   Eosinophils Absolute 0.0 0.0 - 0.5 K/uL   Basophils Relative 0 %   Basophils Absolute 0.0 0.0 - 0.1 K/uL   Immature Granulocytes 2 %   Abs Immature Granulocytes 0.34 (H) 0.00 - 0.07 K/uL   Acanthocytes PRESENT    Polychromasia PRESENT     Comment: Performed at [facility], [place] [address], [place], [place] [address]  Protime-INR     Status: None   Collection Time: 09/17/18 11:49 AM  Result Value Ref Range   Prothrombin Time 15.1 11.4 - 15.2 seconds   INR 1.20     Comment: Performed at [facility], [place] [address], [place], [place] [address]  Influenza panel by PCR (type A & B)     Status: None   Collection Time: 09/17/18 12:12 PM  Result Value Ref Range   Influenza A By PCR NEGATIVE NEGATIVE   Influenza B By PCR NEGATIVE NEGATIVE    Comment: (NOTE) The Xpert Xpress Flu assay is intended as an aid in the diagnosis of  influenza and should not be used as a sole basis for treatment.  This  assay is FDA approved for nasopharyngeal swab specimens only. Nasal  washings and aspirates are unacceptable for Xpert Xpress Flu testing. Performed at [facility], [place] [address] [place], [place] [address]   Lactic acid, plasma     Status: None   Collection Time: 09/17/18  1:42 PM  Result Value Ref Range   Lactic Acid, Venous 1.4 0.5 - 1.9 mmol/L    Comment: Performed at [facility], [place] [address] [place], [place] [address]   Dg Chest 2 View  Result Date: 09/17/2018 CLINICAL DATA:  Possible sepsis EXAM: CHEST - 2 VIEW COMPARISON:  07/17/2018 FINDINGS: Cardiac shadows within normal limits. Right chest wall port is noted in satisfactory position. The lungs are well aerated bilaterally. No focal infiltrate or sizable effusion is seen. No acute bony abnormality is noted. IMPRESSION: No active cardiopulmonary disease. Electronically Signed   By: Inez Catalina M.D.   On: 09/17/2018 12:03   Ct Head Wo Contrast  Result Date: 09/17/2018 CLINICAL DATA:  Altered level of consciousness EXAM: CT HEAD WITHOUT CONTRAST TECHNIQUE: Contiguous axial images were obtained from the base of the skull through the vertex without intravenous contrast. COMPARISON:  July 17, 2018 FINDINGS: Brain: There is mild diffuse atrophy. There is no intracranial mass, hemorrhage, extra-axial fluid collection, or midline shift. There is patchy small vessel disease in the centra semiovale bilaterally. No acute infarct is demonstrable on this study. Vascular: There is no hyperdense vessel. There is calcification in each distal vertebral artery and carotid siphon region. Skull: The bony calvarium appears intact. Sinuses/Orbits: There is mucosal thickening in each medial maxillary antrum. There is opacification and mucosal thickening in several ethmoid air cells. Orbits appear symmetric bilaterally. Other: Mastoid air cells are clear. IMPRESSION: Stable atrophy with  periventricular small vessel disease. No acute infarct evident. No mass or hemorrhage. There are foci of arterial vascular calcification. There are foci of paranasal sinus disease. Electronically Signed   By: Lowella Grip III M.D.   On: 09/17/2018 13:53    Pending Labs Unresulted Labs (From admission, onward)    Start     Ordered   09/18/18 0500  Creatinine, serum  Every 48 hours,   R     09/17/18 1556   09/17/18 1118  Culture, blood (Routine  x 2)  BLOOD CULTURE X 2,   STAT     09/17/18 1117   09/17/18 1118  Urinalysis, Routine w reflex microscopic  ONCE - STAT,   STAT     09/17/18 1117   Signed and Held  [employer] morning,   R     Signed and Held   Signed and Held  Procalcitonin  Tomorrow morning,   R     Signed and Held   Signed and Held  CBC  (enoxaparin (LOVENOX)    CrCl >/= 30 ml/min)  Once,   R    Comments:  Baseline for enoxaparin therapy IF NOT ALREADY DRAWN.  Notify MD if PLT < 100 K.    Signed and Held   Signed and Held  Creatinine, serum  (enoxaparin (LOVENOX)    CrCl >/= 30 ml/min)  Once,   R    Comments:  Baseline for enoxaparin therapy IF NOT ALREADY DRAWN.    Signed and Held   Signed and Held  Creatinine, serum  (enoxaparin (LOVENOX)    CrCl >/= 30 ml/min)  Weekly,   R    Comments:  while on enoxaparin therapy    Signed and Held   Signed and Held  Comprehensive metabolic panel  Tomorrow morning,   R     Signed and Held   Signed and Held  CBC with Differential/Platelet  Tomorrow morning,   R     Signed and Held          Vitals/Pain Today's Vitals   09/17/18 1649 09/17/18 1650 09/17/18 1700 09/17/18 1730  BP:   (!) 179/88 (!) 155/98  Pulse: (!) 111 (!) 112 (!) 108 (!) 138  Resp: (!) 24 (!) 29 (!) 32 (!) 21  Temp:      TempSrc:      SpO2: 100% 100% 100% 100%  Weight:      Height:        Isolation Precautions Droplet precaution  Medications Medications  0.9 %  sodium chloride infusion (1,000 mLs Intravenous New Bag/Given 09/17/18 1207)  amLODipine (NORVASC) tablet 5 mg (has no administration in time range)  0.9 %  sodium chloride infusion ( Intravenous New Bag/Given 09/17/18 1705)  acetaminophen (TYLENOL) tablet 650 mg (650 mg Oral Given 09/17/18 1610)    Or  acetaminophen (TYLENOL) suppository 650 mg ( Rectal See Alternative 09/17/18 1610)  ceFEPIme (MAXIPIME) 1 g in sodium chloride 0.9 % 100 mL IVPB (has no administration in time range)  vancomycin (VANCOCIN) IVPB 1000 mg/200 mL  premix (has no administration in time range)  metoprolol tartrate (LOPRESSOR) injection 2.5 mg (2.5 mg Intravenous Given 09/17/18 1608)  acetaminophen (TYLENOL) suppository 650 mg (650 mg Rectal Given 09/17/18 1143)  sodium chloride 0.9 % bolus 500 mL (0 mLs Intravenous Stopped 09/17/18 1309)  vancomycin (VANCOCIN) IVPB 1000 mg/200 mL premix (0 mg Intravenous Stopped 09/17/18 1417)  ceFEPIme (MAXIPIME) 2 g in sodium chloride 0.9 % 100 mL IVPB (0 g  Intravenous Stopped 09/17/18 1250)  sodium chloride 0.9 % bolus 500 mL (0 mLs Intravenous Stopped 09/17/18 1405)  0.9 %  sodium chloride infusion ( Intravenous Stopped 09/17/18 1533)  sodium chloride 0.9 % bolus 1,000 mL (0 mLs Intravenous Stopped 09/17/18 1703)    Mobility non-ambulatory

## 2018-09-17 NOTE — ED Notes (Signed)
ED provider made aware that urine sample unable to be collected due to foley currently leaking at meatus.

## 2018-09-17 NOTE — ED Triage Notes (Signed)
Pt BIBA from [facility], which he is there for respite care. Staff reports pt arrived at facility on 08/23/18. Staff reports pt is usually AOx4, talkative.  Today staff reports pt was less talkative than usual, pt was confused, unable to speak in complete sentences, inappropriate word use. Pt went to urologist yesterday d/t drainage around catheter and new sediment. Pt febrile as of this morning. Pt with hx of recent UTI.

## 2018-09-17 NOTE — Significant Event (Signed)
Rapid Response Event Note  Overview: Time Called: 1830 Arrival Time: 1832 Event Type: MEWS  Initial Focused Assessment:  Called to room due to red MEWS score, arrived and patient agitated having his foley exchanged to a new one. Wife at bedside discussing plan of care with MD. Confirmed DNR status. Vitals initially elevated HR 130s now sinus tach at 103, Resp in 40s range during foley exchange after change out resp 28, BP now 115/63. After foley exchange patient asleep calm and resting. Rapid response services available and bedside staff advised to call as needed.   Interventions:  MD ordered fluid bolus, urine sample, and additional antipyretics.  Plan of Care (if not transferred):  Event Summary: Name of Physician Notified: Samtani at [facility]   Outcome: Stayed in room and stabalized  Event End Time: [place]

## 2018-09-17 NOTE — Progress Notes (Signed)
Pharmacy Antibiotic Note  Sean Cooke is a 85 y.o. male admitted on 09/17/2018 with sepsis d/t likely urinary source. Pharmacy has been consulted for vancomycin and cefepime dosing.  Plan:  Vancomycin 1000 mg IV now, then 1000 mg IV q24 hr (est AUC 486 based on SCr 1)  Measure vancomycin AUC at steady state as indicated  Cefepime 1 g IV q12 hr  SCr q48 hr while on vancomycin  Height: 5\' 7"  (170.2 cm)(per wife) Weight: 145 lb 15.1 oz (66.2 kg) IBW/kg (Calculated) : 66.1  Temp (24hrs), Avg:102.8 F (39.3 C), Min:101.3 F (38.5 C), Max:104.3 F (40.2 C)  Recent Labs  Lab 09/17/18 1125 09/17/18 1149 09/17/18 1342  WBC  --  18.7*  --   CREATININE  --  0.94  --   LATICACIDVEN 2.1*  --  1.4    Estimated Creatinine Clearance: 49.8 mL/min (by C-G formula based on SCr of 0.94 mg/dL).    Allergies  Allergen Reactions  . Benzonatate Other (See Comments)    D izziness  . Lyrica [Pregabalin] Diarrhea  . Gabapentin Other (See Comments)    Constipation   . Tizanidine Other (See Comments)    Unknown reaction - unable to remember  . Penicillins Rash    Has patient had a PCN reaction causing immediate rash, facial/tongue/throat swelling, SOB or lightheadedness with hypotension: unknown Has patient had a PCN reaction causing severe rash involving mucus membranes or skin necrosis: unknown Has patient had a PCN reaction that required hospitalization : unknown Has patient had a PCN reaction occurring within the last 10 years: no, childhood allergy If all of the above answers are "NO", then may proceed with Cephalosporin use.   . Tramadol Other (See Comments)    Constipation     Antimicrobials this admission: 2/4 vanc >>  2/4 cefepime >>   Dose adjustments this admission: n/a  Microbiology results: 2/4 BCx: sent Planning to check UA/UCx (but already given abx)   Thank you for allowing pharmacy to be a part of this patient's care.  Reuel Boom, PharmD,  BCPS [phone] 09/17/2018, 3:59 PM

## 2018-09-17 NOTE — ED Notes (Signed)
Attempted report x 2 

## 2018-09-17 NOTE — ED Notes (Signed)
PT HAD URINATED AROUND CATH.

## 2018-09-17 NOTE — Progress Notes (Signed)
A consult was received from an ED physician for Vancomycin and Cefepime per pharmacy dosing (for an indication other than meningitis). The patient's profile has been reviewed for ht/wt/allergies/indication/available labs. A one time order has been placed for the above antibiotics.  Further antibiotics/pharmacy consults should be ordered by admitting physician if indicated.                       Reuel Boom, PharmD, BCPS [phone] 09/17/2018, 11:56 AM

## 2018-09-17 NOTE — Progress Notes (Addendum)
spoke with wife subsequently baseline since last d/c was that paitnet has been weak, not really ambulatory and eating poorly I discussed realistically that if he doesn't get better he might get into a resuscitation type scneario She confirms w=she would not wasn't that Updated RN Giving 500 cc bolus Adding ibuprofen for fever 103 Once again DNAR  Verneita Griffes, MD [facility] 6:52 PM

## 2018-09-17 NOTE — ED Notes (Signed)
Attempted report x1. 

## 2018-09-17 NOTE — ED Notes (Signed)
Pt able to swallow >3 oz water, with help of straw, at one time.  Did not cough.

## 2018-09-17 NOTE — ED Provider Notes (Signed)
[facility] DEPT Provider Note   CSN: [id number] Arrival date & time: 09/17/18  1054     History   Chief Complaint Chief Complaint  Patient presents with  . Fever  . Altered Mental Status    HPI Sean Cooke is a 85 y.o. male.  84 year old male with prior medical history as detailed below presents for evaluation of altered mental status.  Patient resides at [facility], and assisted living facility, where he was noted to be confused and disoriented this morning.  Patient is normally alert and oriented x3.  Patient with chronic indwelling Foley.  Staff noticed patient's confusion today and he was transported to the ED for evaluation.  Initial evaluation by nursing staff demonstrated that his Foley had frank purulence draining from his  Bladder. The foley was changed prior to this provider's examination.   Patient is not able to provide significant history given his current mental status.  Level 5 caveat secondary to same.  Additional history is obtained from his wife who has arrived at bedside.  She reports that he was in his normal state of health until this morning.  Patient's wife does confirm that the patient is DNR/DNI (and not full code as documented).   The history is provided by the spouse, the patient and medical records.  Altered Mental Status  Presenting symptoms: confusion   Severity:  Moderate Most recent episode:  Today Episode history:  Single Duration:  1 day Timing:  Constant Progression:  Waxing and waning Chronicity:  New Associated symptoms: fever     Past Medical History:  Diagnosis Date  . Anemia   . Arthritis   . Benign localized prostatic hyperplasia with lower urinary tract symptoms (LUTS)   . Bilateral edema of lower extremity   . Borderline diabetes   . Chronic low back pain   . DDD (degenerative disc disease), lumbar   . Degenerative joint disease   . Diverticulosis   . Duodenal diverticulum   .  Encounter for antineoplastic chemotherapy 07/2015   lung cancer--- per pt completed chemo 06/ 2018  . Fatigue   . Generalized weakness   . GERD (gastroesophageal reflux disease)   . Hemorrhoids   . Herniated intervertebral disc of lumbar spine    L4-5  . Hiatal hernia   . History of adenomatous polyp of colon   . Hypertension   . Lumbar stenosis    L4-5  . 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 Pancreatic cancer [facility]) oncologist-  dr Julien Nordmann   new dx 09/ 2019  . Poor dental hygiene   . Wears hearing aid in both ears     Patient Active Problem List   Diagnosis Date Noted  . Cellulitis of scrotum   . Cellulitis 07/21/2018  . Coagulopathy ([place]) 06/21/2018  . Preoperative clearance 05/22/2018  . Primary adenocarcinoma of body of pancreas ([place]) 04/18/2018  . Diarrhea 10/29/2015  . Encounter for antineoplastic chemotherapy 07/31/2015  . Malignant neoplasm of right upper lobe of lung ([place]) 07/15/2015  . Hyponatremia 10/07/2014  . Community acquired bacterial pneumonia 10/04/2014  . Leukocytosis 10/04/2014  . Essential hypertension 10/04/2014  . BPH (benign prostatic hyperplasia) 10/04/2014  . Abdominal pain, LLQ (left lower quadrant) 04/03/2014  . Constipation 08/20/2013  . Lactose intolerance 08/20/2013  . GERD  04/27/2008  . THROMBOCYTHEMIA 04/23/2008  . Normocytic anemia 04/23/2008  . SPONDYLOSIS, LUMBAR 04/23/2008  . NEURITIS, LUMBOSACRAL 04/23/2008  . OSTEOPENIA 04/23/2008  . ASPLENIA 04/23/2008  . URINARY FREQUENCY 04/23/2008  . COLONIC POLYPS, ADENOMATOUS, HX OF 04/23/2008    Past Surgical History:  Procedure Laterality Date  . CATARACT EXTRACTION W/ INTRAOCULAR LENS  IMPLANT, BILATERAL  2004  . ESOPHAGOGASTRODUODENOSCOPY (EGD) WITH PROPOFOL N/A 07/08/2018   Procedure:  ESOPHAGOGASTRODUODENOSCOPY (EGD) WITH PROPOFOL;  Surgeon: Rush Landmark Telford Nab., MD;  Location: [facility];  Service: Gastroenterology;  Laterality: N/A;  . EUS N/A 05/16/2018   Procedure: UPPER ENDOSCOPIC ULTRASOUND (EUS) RADIAL;  Surgeon: Milus Banister, MD;  Location: [facility];  Service: Endoscopy;  Laterality: N/A;  . EUS N/A 07/08/2018   Procedure: UPPER ENDOSCOPIC ULTRASOUND (EUS) RADIAL;  Surgeon: Rush Landmark Telford Nab., MD;  Location: [facility];  Service: Gastroenterology;  Laterality: N/A;  . FINE NEEDLE ASPIRATION N/A 05/16/2018   Procedure: FINE NEEDLE ASPIRATION (FNA) LINEAR;  Surgeon: Milus Banister, MD;  Location: [facility];  Service: Endoscopy;  Laterality: N/A;  . INGUINAL HERNIA REPAIR  1980s   unilateral , pt unsure which side  . LAPAROSCOPIC CHOLECYSTECTOMY  07-02-2003   dr gerkin @[facility]   . SPLENECTOMY  10-11-2000   @[facility]    via exploratory laparotomy for blunt abdominal trauma  . TRANSURETHRAL RESECTION OF PROSTATE  1988        Home Medications    Prior to Admission medications   Medication Sig Start Date End Date Taking? Authorizing Provider  Amino Acids-Protein Hydrolys (FEEDING SUPPLEMENT, PRO-STAT SUGAR FREE 64,) LIQD Take 30 mLs by mouth 2 (two) times daily.   Yes [provider]  amLODipine (NORVASC) 5 MG tablet Take 5 mg by mouth every evening.    Yes [provider]  antiseptic oral rinse (BIOTENE) LIQD 15 mLs by Mouth Rinse route 5 (five) times daily as needed for dry mouth.    Yes [provider]  aspirin 81 MG tablet Take 81 mg by mouth at bedtime.    Yes [provider]  bisacodyl (DULCOLAX) 5 MG EC tablet Take 5 mg by mouth daily as needed for moderate constipation.   Yes [provider]  Calcium Carb-Cholecalciferol (CALCIUM-VITAMIN D) 500-200 MG-UNIT tablet Take 1 tablet by mouth 2 (two) times daily.    Yes [provider]  cromolyn (OPTICROM) 4 % ophthalmic solution Place 1 drop into  both eyes 4 (four) times daily.  07/25/16  Yes [provider]  denosumab (PROLIA) 60 MG/ML SOLN injection Inject 60 mg into the skin every 6 (six) months. Administer in upper arm, thigh, or abdomen   Yes [provider]  famotidine (PEPCID) 20 MG tablet Take 20 mg by mouth 2 (two) times daily.   Yes [provider]  guaiFENesin (MUCINEX) 600 MG 12 hr tablet Take 600 mg by mouth every 12 (twelve) hours.    Yes [provider]  ibuprofen (ADVIL,MOTRIN) 800 MG tablet Take 1 tablet (800 mg total) by mouth every 8 (eight) hours as needed. 08/02/18  Yes Elodia Florence., MD  insulin lispro (HUMALOG) 100 UNIT/ML injection Inject 2-4 Units into the skin 2 (two) times daily. Per sliding scale, if 181-300=2 units, 301+=4 units   Yes [provider]  loratadine (CLARITIN) 10 MG tablet Take 10 mg by mouth every morning.    Yes [provider]  losartan (COZAAR) 100 MG tablet Take 100 mg by mouth every morning.  03/29/16  Yes [provider]  magnesium hydroxide (MILK OF MAGNESIA) 400 MG/5ML suspension Take 30 mLs by mouth at bedtime.   Yes [provider]  Multiple Vitamin (MULTIVITAMIN) tablet Take 1 tablet by mouth daily.   Yes [provider]  Omega-3 Fatty Acids (FISH OIL) 1000 MG CAPS Take 1,000 mg by mouth daily.    Yes [provider]  pantoprazole (PROTONIX) 40 MG tablet Take 40 mg by mouth daily.    Yes [provider]  polyethylene glycol (MIRALAX / GLYCOLAX) packet Take 17 g by mouth daily as needed (constipation.).    Yes [provider]  Probiotic Product (ALIGN) 4 MG CAPS Take 4 mg by mouth daily.    Yes [provider]  simethicone (MYLICON) 80 MG chewable tablet Chew 80 mg by mouth every 6 (six) hours as needed for flatulence.   Yes [provider]  tamsulosin (FLOMAX) 0.4 MG CAPS capsule Take 0.4 mg by mouth at bedtime.   Yes [provider]  traZODone  (DESYREL) 50 MG tablet Take 25 mg by mouth at bedtime.   Yes [provider]  Turmeric 500 MG TABS Take 500 mg by mouth daily after supper.    Yes [provider]  Lidocaine, Anorectal, 5 % CREA Apply externally to affected anorectal area up to 6 times/day Patient not taking: Reported on 09/17/2018 07/17/18   Maczis, Barth Kirks, PA-C  lidocaine-prilocaine (EMLA) cream Apply 1 application topically as needed. Patient taking differently: Apply 1 application topically as needed (port access).  09/27/15   Curt Bears, MD    Family History Family History  Problem Relation Age of Onset  . Diabetes Father   . Colon cancer Neg Hx     Social History Social History   Tobacco Use  . Smoking status: Former Smoker    Years: 30.00    Types: Cigarettes, Pipe    Last attempt to quit: 08/14/1988    Years since quitting: 30.1  . Smokeless tobacco: Never Used  . Tobacco comment: 06-13-2018 per pt quit cigarettes in 1960;  quit pipe 1990  Substance Use Topics  . Alcohol use: Not Currently    Alcohol/week: 0.0 standard drinks  . Drug use: Never     Allergies   Benzonatate; Lyrica [pregabalin]; Gabapentin; Tizanidine; Penicillins; and Tramadol   Review of Systems Review of Systems  Unable to perform ROS: Mental status change  Constitutional: Positive for fever.  Psychiatric/Behavioral: Positive for confusion.     Physical Exam Updated Vital Signs BP (!) 148/91   Pulse (!) 116   Temp (S) (!) 104.3 F (40.2 C) (Rectal)   Resp (!) 28   Wt 66.2 kg   SpO2 95%   BMI 22.19 kg/m   Physical Exam Vitals signs and nursing note reviewed.  Constitutional:      General: He is not in acute distress.    Appearance: Normal appearance. He is well-developed.  HENT:     Head: Normocephalic and atraumatic.  Eyes:     Conjunctiva/sclera: Conjunctivae normal.     Pupils: Pupils are equal, round, and reactive to light.  Neck:     Musculoskeletal: Normal range of motion and neck  supple.  Cardiovascular:     Rate and Rhythm: Normal rate and regular rhythm.     Heart sounds: Normal heart sounds.  Pulmonary:     Effort: Pulmonary effort is normal. No respiratory distress.     Breath sounds: Normal breath sounds.  Abdominal:     General: There  is no distension.     Palpations: Abdomen is soft.     Tenderness: There is no abdominal tenderness.  Musculoskeletal: Normal range of motion.        General: No deformity.  Skin:    General: Skin is warm and dry.  Neurological:     General: No focal deficit present.     Mental Status: He is alert. He is disoriented.     Motor: No weakness.     Coordination: Coordination normal.      ED Treatments / Results  Labs (all labs ordered are listed, but only abnormal results are displayed) Labs Reviewed  COMPREHENSIVE METABOLIC PANEL - Abnormal; Notable for the following components:      Result Value   Sodium 134 (*)    Chloride 97 (*)    Glucose, Bld 154 (*)    Calcium 11.6 (*)    Albumin 3.3 (*)    All other components within normal limits  LACTIC ACID, PLASMA - Abnormal; Notable for the following components:   Lactic Acid, Venous 2.1 (*)    All other components within normal limits  CBC WITH DIFFERENTIAL/PLATELET - Abnormal; Notable for the following components:   WBC 18.7 (*)    RBC 3.43 (*)    Hemoglobin 10.7 (*)    HCT 34.5 (*)    MCV 100.6 (*)    RDW 15.6 (*)    Platelets 468 (*)    Neutro Abs 16.4 (*)    Monocytes Absolute 1.1 (*)    Abs Immature Granulocytes 0.34 (*)    All other components within normal limits  CULTURE, BLOOD (ROUTINE X 2)  CULTURE, BLOOD (ROUTINE X 2)  LACTIC ACID, PLASMA  PROTIME-INR  INFLUENZA PANEL BY PCR (TYPE A & B)  URINALYSIS, ROUTINE W REFLEX MICROSCOPIC    EKG EKG Interpretation  Date/Time:  Tuesday September 17 2018 11:27:56 EST Ventricular Rate:  101 PR Interval:    QRS Duration: 86 QT Interval:  298 QTC Calculation: 387 R Axis:   48 Text Interpretation:   Sinus tachycardia Low voltage, extremity leads Confirmed by Dene Gentry [phone]) on 09/17/2018 11:38:36 AM   Radiology Dg Chest 2 View  Result Date: 09/17/2018 CLINICAL DATA:  Possible sepsis EXAM: CHEST - 2 VIEW COMPARISON:  07/17/2018 FINDINGS: Cardiac shadows within normal limits. Right chest wall port is noted in satisfactory position. The lungs are well aerated bilaterally. No focal infiltrate or sizable effusion is seen. No acute bony abnormality is noted. IMPRESSION: No active cardiopulmonary disease. Electronically Signed   By: Inez Catalina M.D.   On: 09/17/2018 12:03   Ct Head Wo Contrast  Result Date: 09/17/2018 CLINICAL DATA:  Altered level of consciousness EXAM: CT HEAD WITHOUT CONTRAST TECHNIQUE: Contiguous axial images were obtained from the base of the skull through the vertex without intravenous contrast. COMPARISON:  July 17, 2018 FINDINGS: Brain: There is mild diffuse atrophy. There is no intracranial mass, hemorrhage, extra-axial fluid collection, or midline shift. There is patchy small vessel disease in the centra semiovale bilaterally. No acute infarct is demonstrable on this study. Vascular: There is no hyperdense vessel. There is calcification in each distal vertebral artery and carotid siphon region. Skull: The bony calvarium appears intact. Sinuses/Orbits: There is mucosal thickening in each medial maxillary antrum. There is opacification and mucosal thickening in several ethmoid air cells. Orbits appear symmetric bilaterally. Other: Mastoid air cells are clear. IMPRESSION: Stable atrophy with periventricular small vessel disease. No acute infarct evident. No mass or hemorrhage. There  are foci of arterial vascular calcification. There are foci of paranasal sinus disease. Electronically Signed   By: Lowella Grip III M.D.   On: 09/17/2018 13:53    Procedures Procedures (including critical care time) CRITICAL CARE Performed by: Valarie Merino   Total critical care  time: 30 minutes  Critical care time was exclusive of separately billable procedures and treating other patients.  Critical care was necessary to treat or prevent imminent or life-threatening deterioration.  Critical care was time spent personally by me on the following activities: development of treatment plan with patient and/or surrogate as well as nursing, discussions with consultants, evaluation of patient's response to treatment, examination of patient, obtaining history from patient or surrogate, ordering and performing treatments and interventions, ordering and review of laboratory studies, ordering and review of radiographic studies, pulse oximetry and re-evaluation of patient's condition.   Medications Ordered in ED Medications  sodium chloride 0.9 % bolus 500 mL (500 mLs Intravenous New Bag/Given 09/17/18 1210)  vancomycin (VANCOCIN) IVPB 1000 mg/200 mL premix (has no administration in time range)  0.9 %  sodium chloride infusion (1,000 mLs Intravenous New Bag/Given 09/17/18 1207)  sodium chloride 0.9 % bolus 500 mL (has no administration in time range)  acetaminophen (TYLENOL) suppository 650 mg (650 mg Rectal Given 09/17/18 1143)  ceFEPIme (MAXIPIME) 2 g in sodium chloride 0.9 % 100 mL IVPB (2 g Intravenous New Bag/Given 09/17/18 1209)     Initial Impression / Assessment and Plan / ED Course  I have reviewed the triage vital signs and the nursing notes.  Pertinent labs & imaging results that were available during my care of the patient were reviewed by me and considered in my medical decision making (see chart for details).     MDM  Screen complete  Patient is presenting for evaluation of acute delirium with associated fever.  Symptoms are most likely secondary to UTI with possible early sepsis.  Patient given broad spectrum antibiotic coverage in the ED.  Initial fluid resuscitation initiated in the ED.  Patient and patient's family are aware of plan of care and need for  admission.  Wife of the patient confirms to this examiner that the patient is a DNR/DNI.  Hospitalist service is aware of case and will evaluate for admission.  Final Clinical Impressions(s) / ED Diagnoses   Final diagnoses:  Fever, unspecified fever cause    ED Discharge Orders    None       Valarie Merino, MD 09/17/18 1452

## 2018-09-18 DIAGNOSIS — L8990 Pressure ulcer of unspecified site, unspecified stage: Secondary | ICD-10-CM

## 2018-09-18 DIAGNOSIS — N390 Urinary tract infection, site not specified: Secondary | ICD-10-CM

## 2018-09-18 DIAGNOSIS — R7881 Bacteremia: Secondary | ICD-10-CM | POA: Diagnosis present

## 2018-09-18 DIAGNOSIS — E119 Type 2 diabetes mellitus without complications: Secondary | ICD-10-CM

## 2018-09-18 LAB — BLOOD CULTURE ID PANEL (REFLEXED)
Acinetobacter baumannii: NOT DETECTED
Candida albicans: NOT DETECTED
Candida glabrata: NOT DETECTED
Candida krusei: NOT DETECTED
Candida parapsilosis: NOT DETECTED
Candida tropicalis: NOT DETECTED
Carbapenem resistance: NOT DETECTED
Enterobacter cloacae complex: NOT DETECTED
Enterobacteriaceae species: DETECTED — AB
Enterococcus species: NOT DETECTED
Escherichia coli: NOT DETECTED
Haemophilus influenzae: NOT DETECTED
Klebsiella oxytoca: NOT DETECTED
Klebsiella pneumoniae: NOT DETECTED
Listeria monocytogenes: NOT DETECTED
Neisseria meningitidis: NOT DETECTED
Proteus species: DETECTED — AB
Pseudomonas aeruginosa: NOT DETECTED
STAPHYLOCOCCUS AUREUS BCID: NOT DETECTED
Serratia marcescens: NOT DETECTED
Staphylococcus species: NOT DETECTED
Streptococcus agalactiae: NOT DETECTED
Streptococcus pneumoniae: NOT DETECTED
Streptococcus pyogenes: NOT DETECTED
Streptococcus species: NOT DETECTED

## 2018-09-18 LAB — COMPREHENSIVE METABOLIC PANEL
ALT: 20 U/L (ref 0–44)
AST: 45 U/L — ABNORMAL HIGH (ref 15–41)
Albumin: 2.5 g/dL — ABNORMAL LOW (ref 3.5–5.0)
Alkaline Phosphatase: 68 U/L (ref 38–126)
Anion gap: 6 (ref 5–15)
BUN: 14 mg/dL (ref 8–23)
CO2: 25 mmol/L (ref 22–32)
Calcium: 10.5 mg/dL — ABNORMAL HIGH (ref 8.9–10.3)
Chloride: 105 mmol/L (ref 98–111)
Creatinine, Ser: 0.77 mg/dL (ref 0.61–1.24)
GFR calc Af Amer: >60 mL/min (ref >60)
Glucose, Bld: 149 mg/dL — ABNORMAL HIGH (ref 70–99)
Potassium: 3.9 mmol/L (ref 3.5–5.1)
Sodium: 136 mmol/L (ref 135–145)
Total Bilirubin: 0.9 mg/dL (ref 0.3–1.2)
Total Protein: 5.8 g/dL — ABNORMAL LOW (ref 6.5–8.1)

## 2018-09-18 LAB — CBC WITH DIFFERENTIAL/PLATELET
Abs Immature Granulocytes: 0.25 10*3/uL — ABNORMAL HIGH (ref 0.00–0.07)
Basophils Absolute: 0 10*3/uL (ref 0.0–0.1)
Basophils Relative: 0 %
Eosinophils Absolute: 0 10*3/uL (ref 0.0–0.5)
Eosinophils Relative: 0 %
HCT: 33.3 % — ABNORMAL LOW (ref 39.0–52.0)
Hemoglobin: 10.4 g/dL — ABNORMAL LOW (ref 13.0–17.0)
Immature Granulocytes: 2 %
Lymphocytes Relative: 5 %
Lymphs Abs: 0.8 10*3/uL (ref 0.7–4.0)
MCH: 30.9 pg (ref 26.0–34.0)
MCHC: 31.2 g/dL (ref 30.0–36.0)
MCV: 98.8 fL (ref 80.0–100.0)
MONO ABS: 0.7 10*3/uL (ref 0.1–1.0)
Monocytes Relative: 4 %
Neutro Abs: 15.1 10*3/uL — ABNORMAL HIGH (ref 1.7–7.7)
Neutrophils Relative %: 89 %
Platelets: 374 10*3/uL (ref 150–400)
RBC: 3.37 MIL/uL — AB (ref 4.22–5.81)
RDW: 15.4 % (ref 11.5–15.5)
WBC: 16.9 10*3/uL — AB (ref 4.0–10.5)
nRBC: 0 % (ref 0.0–0.2)

## 2018-09-18 LAB — PROCALCITONIN: Procalcitonin: 0.39 ng/mL

## 2018-09-18 LAB — GLUCOSE, CAPILLARY
Glucose-Capillary: 123 mg/dL — ABNORMAL HIGH (ref 70–99)
Glucose-Capillary: 140 mg/dL — ABNORMAL HIGH (ref 70–99)

## 2018-09-18 LAB — PROTIME-INR
INR: 1.33
Prothrombin Time: 16.3 seconds — ABNORMAL HIGH (ref 11.4–15.2)

## 2018-09-18 MED ORDER — SODIUM CHLORIDE 0.9 % IV SOLN
2.0000 g | INTRAVENOUS | Status: DC
  Administered 2018-09-18 – 2018-09-20 (×3): 2 g via INTRAVENOUS
  Filled 2018-09-18 (×3): qty 2

## 2018-09-18 MED ORDER — INSULIN ASPART 100 UNIT/ML ~~LOC~~ SOLN
0.0000 [IU] | Freq: Three times a day (TID) | SUBCUTANEOUS | Status: DC
  Administered 2018-09-18: 1 [IU] via SUBCUTANEOUS
  Administered 2018-09-19: 2 [IU] via SUBCUTANEOUS
  Administered 2018-09-19: 5 [IU] via SUBCUTANEOUS
  Administered 2018-09-19: 1 [IU] via SUBCUTANEOUS
  Administered 2018-09-20: 5 [IU] via SUBCUTANEOUS
  Administered 2018-09-20: 2 [IU] via SUBCUTANEOUS
  Administered 2018-09-20: 3 [IU] via SUBCUTANEOUS
  Administered 2018-09-21: 5 [IU] via SUBCUTANEOUS
  Administered 2018-09-21 – 2018-09-22 (×2): 2 [IU] via SUBCUTANEOUS
  Administered 2018-09-22: 3 [IU] via SUBCUTANEOUS
  Administered 2018-09-23: 2 [IU] via SUBCUTANEOUS
  Administered 2018-09-23: 5 [IU] via SUBCUTANEOUS
  Administered 2018-09-23 – 2018-09-24 (×2): 2 [IU] via SUBCUTANEOUS
  Administered 2018-09-24: 3 [IU] via SUBCUTANEOUS
  Administered 2018-09-24: 5 [IU] via SUBCUTANEOUS
  Administered 2018-09-25: 7 [IU] via SUBCUTANEOUS
  Administered 2018-09-25: 2 [IU] via SUBCUTANEOUS
  Administered 2018-09-25: 5 [IU] via SUBCUTANEOUS
  Administered 2018-09-26: 3 [IU] via SUBCUTANEOUS
  Administered 2018-09-26: 2 [IU] via SUBCUTANEOUS
  Administered 2018-09-26: 5 [IU] via SUBCUTANEOUS
  Administered 2018-09-27: 2 [IU] via SUBCUTANEOUS
  Administered 2018-09-27: 3 [IU] via SUBCUTANEOUS
  Administered 2018-09-27: 7 [IU] via SUBCUTANEOUS

## 2018-09-18 NOTE — Progress Notes (Signed)
PHARMACY - PHYSICIAN COMMUNICATION CRITICAL VALUE ALERT - BLOOD CULTURE IDENTIFICATION (BCID)  Sean Cooke is an 85 y.o. male who presented to [facility] on 09/17/2018 with a chief complaint of fever, altered mental status.   Assessment:  Urine? (include suspected source if known) 2 of 4 bottles Name of physician (or Provider) Contacted: Raliegh Ip Schorr, NP  Current antibiotics: Cefepime 1 Gm IV q12h  Changes to prescribed antibiotics recommended:  Per BCID treatment algorithm will narrow abx to Rocephin 2 Gm IV q24h.  Results for orders placed or performed during the hospital encounter of 09/17/18  Blood Culture ID Panel (Reflexed) (Collected: 09/17/2018 11:28 AM)  Result Value Ref Range   Enterococcus species NOT DETECTED NOT DETECTED   Listeria monocytogenes NOT DETECTED NOT DETECTED   Staphylococcus species NOT DETECTED NOT DETECTED   Staphylococcus aureus (BCID) NOT DETECTED NOT DETECTED   Streptococcus species NOT DETECTED NOT DETECTED   Streptococcus agalactiae NOT DETECTED NOT DETECTED   Streptococcus pneumoniae NOT DETECTED NOT DETECTED   Streptococcus pyogenes NOT DETECTED NOT DETECTED   Acinetobacter baumannii NOT DETECTED NOT DETECTED   Enterobacteriaceae species DETECTED (A) NOT DETECTED   Enterobacter cloacae complex NOT DETECTED NOT DETECTED   Escherichia coli NOT DETECTED NOT DETECTED   Klebsiella oxytoca NOT DETECTED NOT DETECTED   Klebsiella pneumoniae NOT DETECTED NOT DETECTED   Proteus species DETECTED (A) NOT DETECTED   Serratia marcescens NOT DETECTED NOT DETECTED   Carbapenem resistance NOT DETECTED NOT DETECTED   Haemophilus influenzae NOT DETECTED NOT DETECTED   Neisseria meningitidis NOT DETECTED NOT DETECTED   Pseudomonas aeruginosa NOT DETECTED NOT DETECTED   Candida albicans NOT DETECTED NOT DETECTED   Candida glabrata NOT DETECTED NOT DETECTED   Candida krusei NOT DETECTED NOT DETECTED   Candida parapsilosis NOT DETECTED NOT DETECTED   Candida  tropicalis NOT DETECTED NOT DETECTED    Dorrene German 09/18/2018  4:39 AM

## 2018-09-18 NOTE — Progress Notes (Signed)
Temp at 0724 102.2, HR 120. Toradol given. Recheck at 0826 temp 101.3, HR 112. Ibuprofen given. Leeana Creer, Bing Neighbors, RN

## 2018-09-18 NOTE — Progress Notes (Signed)
PROGRESS NOTE    Sean Cooke  KWI:[id number] DOB: 1927/02/01 DOA: 09/17/2018 PCP: [facility]    Brief Narrative:  Patient is a 85 year old resident of [facility] usually alert oriented, prior history of adenocarcinoma stage III of the lung, newly diagnosed stage I T2N0 pancreatic cancer status post XRT 08/19/2018, supposed to return to oncology clinic 09/19/2018, history of BPH, diastolic heart failure, recent hospitalization 07/21/2018 for scrotal cellulitis and need of indwelling Foley catheter who presented to the ED with confusion, fever 104 and Foley noted to have frank pus around it which was changed at the facility.  On admission patient noted to be septic and sepsis work-up underway.  Patient placed empirically on IV antibiotics.   Assessment & Plan:   Principal Problem:   Sepsis ([place]) Active Problems:   BPH (benign prostatic hyperplasia)   Malignant neoplasm of right upper lobe of lung (HCC)   Pressure injury of skin   Bacteremia: Probable   Acute lower UTI   Type 2 diabetes mellitus without complication (HCC)  #1 sepsis secondary to bacteremia and UTI. Patient still somewhat confused however more alert today.  Patient likely not at baseline.  Patient noted on admission to have a fever of 104, confused, tachycardia and tachypneic.  Patient pancultured with preliminary blood cultures with gram-negative rods and BC ID positive for Enterobacter species and Proteus.  Fever curve trending down however patient noted to have a temp of 102.2 this morning with a heart rate in the 120s.  Continue supportive care, IV Rocephin, gentle hydration.  Follow.  2.  Acute toxic metabolic encephalopathy Likely secondary to problem #1.  Patient more alert today.  Patient was placed on clear liquids.  Advance to full liquid diet.  Swallow evaluation.  Continue empiric IV antibiotics.  Follow.  3.  Hypertension Stable.  Continue current regimen of Norvasc.   Continue to hold Cozaar.  4.  Depression/insomnia Continue to hold trazodone.  Follow.  5.  History of non-small cell lung cancer stage IIIb status post chemotherapy in 2016 Oncology has been informed of patient's admission via epic.  Outpatient follow-up.  6.  Recent diagnosis of pancreatic cancer status post XRT Admitting physician stated he sent an inbox message to Dr. Lisbeth Renshaw.  7.  Diabetes mellitus type 2 Hemoglobin A1c was 6.1 on 06/13/2018.  Once tolerating oral intake placed on sliding scale insulin.  8.  Senile osteoporosis Continue to hold Prolia at this time.    DVT prophylaxis: Lovenox Code Status: DNR Family Communication: No family at bedside. Disposition Plan: To be determined.   Consultants:   None  Procedures:   CT head 09/17/2018  Chest x-ray 09/17/2018  Antimicrobials:  IV cefepime 09/17/2018 x 1 dose  IV Rocephin 09/18/2018  IV vancomycin 09/17/2018>>>> 09/18/2018   Subjective: Patient laying in bed with clear liquids at bedside.  Patient more alert.  Patient following some commands.  Patient answering some questions appropriately however some bouts of confusion noted.  Objective: Vitals:   09/18/18 0826 09/18/18 0941 09/18/18 1349 09/18/18 1400  BP:  (!) 109/59 131/74   Pulse: (!) 112 (!) 102 83   Resp:  (!) 22 18   Temp: (!) 101.3 F (38.5 C) 99.4 F (37.4 C) 98.2 F (36.8 C) 98.8 F (37.1 C)  TempSrc: Rectal Rectal Oral Rectal  SpO2:  92% 96%   Weight:      Height:        Intake/Output Summary (Last 24 hours) at 09/18/2018  [place] filed at 09/18/2018 1500 Gross per 24 hour  Intake 4116.62 ml  Output 1575 ml  Net 2541.62 ml   Filed Weights   09/17/18 1140  Weight: 66.2 kg    Examination:  General exam: Somewhat confused.  Extremely dry mucous membranes. Respiratory system: Clear to auscultation. Respiratory effort normal. Cardiovascular system: S1 & S2 heard, RRR. No JVD, murmurs, rubs, gallops or clicks. No pedal  edema. Gastrointestinal system: Abdomen is nondistended, soft and nontender. No organomegaly or masses felt. Normal bowel sounds heard. Central nervous system: Alert and oriented. No focal neurological deficits. Extremities: Symmetric 5 x 5 power. Skin: No rashes, lesions or ulcers Psychiatry: Judgement and insight appear poor to fair. Mood & affect appropriate.     Data Reviewed: I have personally reviewed following labs and imaging studies  CBC: Recent Labs  Lab 09/17/18 1149 09/18/18 0452  WBC 18.7* 16.9*  NEUTROABS 16.4* 15.1*  HGB 10.7* 10.4*  HCT 34.5* 33.3*  MCV 100.6* 98.8  PLT 468* 786   Basic Metabolic Panel: Recent Labs  Lab 09/17/18 1149 09/18/18 0452  NA 134* 136  K 3.5 3.9  CL 97* 105  CO2 28 25  GLUCOSE 154* 149*  BUN 17 14  CREATININE 0.94 0.77  CALCIUM 11.6* 10.5*   GFR: Estimated Creatinine Clearance: 58.5 mL/min (by C-G formula based on SCr of 0.77 mg/dL). Liver Function Tests: Recent Labs  Lab 09/17/18 1149 09/18/18 0452  AST 27 45*  ALT 18 20  ALKPHOS 86 68  BILITOT 0.8 0.9  PROT 7.2 5.8*  ALBUMIN 3.3* 2.5*   No results for input(s): LIPASE, AMYLASE in the last 168 hours. No results for input(s): AMMONIA in the last 168 hours. Coagulation Profile: Recent Labs  Lab 09/17/18 1149 09/18/18 0452  INR 1.20 1.33   Cardiac Enzymes: No results for input(s): CKTOTAL, CKMB, CKMBINDEX, TROPONINI in the last 168 hours. BNP (last 3 results) No results for input(s): PROBNP in the last 8760 hours. HbA1C: No results for input(s): HGBA1C in the last 72 hours. CBG: No results for input(s): GLUCAP in the last 168 hours. Lipid Profile: No results for input(s): CHOL, HDL, LDLCALC, TRIG, CHOLHDL, LDLDIRECT in the last 72 hours. Thyroid Function Tests: No results for input(s): TSH, T4TOTAL, FREET4, T3FREE, THYROIDAB in the last 72 hours. Anemia Panel: No results for input(s): VITAMINB12, FOLATE, FERRITIN, TIBC, IRON, RETICCTPCT in the last 72  hours. Sepsis Labs: Recent Labs  Lab 09/17/18 1125 09/17/18 1342 09/18/18 0452  PROCALCITON  --   --  0.39  LATICACIDVEN 2.1* 1.4  --     Recent Results (from the past 240 hour(s))  Culture, blood (Routine x 2)     Status: None (Preliminary result)   Collection Time: 09/17/18 11:28 AM  Result Value Ref Range Status   Specimen Description   Final    BLOOD BLOOD RIGHT FOREARM Performed at [facility] [address] [place], [place] [address]    Special Requests   Final    BOTTLES DRAWN AEROBIC AND ANAEROBIC Blood Culture adequate volume Performed at [facility] [address], [place], [place] [address]    Culture  Setup Time   Final    GRAM NEGATIVE RODS IN BOTH AEROBIC AND ANAEROBIC BOTTLES CRITICAL RESULT CALLED TO, READ BACK BY AND VERIFIED WITH: B. GREEN,PHARMD 0425 09/18/2018 Mena Goes Performed at [facility], [place] [address] [place],  [address]    Culture GRAM NEGATIVE RODS  Final  Report Status PENDING  Incomplete  Blood Culture ID Panel (Reflexed)     Status: Abnormal   Collection Time: 09/17/18 11:28 AM  Result Value Ref Range Status   Enterococcus species NOT DETECTED NOT DETECTED Final   Listeria monocytogenes NOT DETECTED NOT DETECTED Final   Staphylococcus species NOT DETECTED NOT DETECTED Final   Staphylococcus aureus (BCID) NOT DETECTED NOT DETECTED Final   Streptococcus species NOT DETECTED NOT DETECTED Final   Streptococcus agalactiae NOT DETECTED NOT DETECTED Final   Streptococcus pneumoniae NOT DETECTED NOT DETECTED Final   Streptococcus pyogenes NOT DETECTED NOT DETECTED Final   Acinetobacter baumannii NOT DETECTED NOT DETECTED Final   Enterobacteriaceae species DETECTED (A) NOT DETECTED Final    Comment: Enterobacteriaceae represent a large family of gram-negative bacteria, not a single organism. CRITICAL RESULT CALLED TO, READ BACK BY AND VERIFIED WITH: B. GREEN,PHARMD 0425 09/18/2018 T.  TYSOR    Enterobacter cloacae complex NOT DETECTED NOT DETECTED Final   Escherichia coli NOT DETECTED NOT DETECTED Final   Klebsiella oxytoca NOT DETECTED NOT DETECTED Final   Klebsiella pneumoniae NOT DETECTED NOT DETECTED Final   Proteus species DETECTED (A) NOT DETECTED Final    Comment: CRITICAL RESULT CALLED TO, READ BACK BY AND VERIFIED WITH: B. GREEN,PHARMD 0425 09/18/2018 T. TYSOR    Serratia marcescens NOT DETECTED NOT DETECTED Final   Carbapenem resistance NOT DETECTED NOT DETECTED Final   Haemophilus influenzae NOT DETECTED NOT DETECTED Final   Neisseria meningitidis NOT DETECTED NOT DETECTED Final   Pseudomonas aeruginosa NOT DETECTED NOT DETECTED Final   Candida albicans NOT DETECTED NOT DETECTED Final   Candida glabrata NOT DETECTED NOT DETECTED Final   Candida krusei NOT DETECTED NOT DETECTED Final   Candida parapsilosis NOT DETECTED NOT DETECTED Final   Candida tropicalis NOT DETECTED NOT DETECTED Final    Comment: Performed at [facility], [place] [address] [place], [place] [address]  Culture, blood (Routine x 2)     Status: None (Preliminary result)   Collection Time: 09/17/18 11:34 AM  Result Value Ref Range Status   Specimen Description   Final    BLOOD RIGHT HAND Performed at [facility] [address], [place], [place] [address]    Special Requests   Final    BOTTLES DRAWN AEROBIC AND ANAEROBIC Blood Culture results may not be optimal due to an inadequate volume of blood received in culture bottles Performed at [facility] [address] [place], [place] [address]    Culture  Setup Time   Final    GRAM NEGATIVE RODS IN BOTH AEROBIC AND ANAEROBIC BOTTLES CRITICAL VALUE NOTED.  VALUE IS CONSISTENT WITH PREVIOUSLY REPORTED AND CALLED VALUE. Performed at [facility], [place] [address], [place], [place] [address]    Culture GRAM NEGATIVE RODS  Final   Report Status PENDING  Incomplete  MRSA PCR Screening      Status: None   Collection Time: 09/17/18  9:27 PM  Result Value Ref Range Status   MRSA by PCR NEGATIVE NEGATIVE Final    Comment:        The GeneXpert MRSA Assay (FDA approved for NASAL specimens only), is one component of a comprehensive MRSA colonization surveillance program. It is not intended to diagnose MRSA infection nor to guide or monitor treatment for MRSA infections. Performed at [facility], [place] [address], [place], [place] [address]          Radiology Studies: Dg Chest  2 View  Result Date: 09/17/2018 CLINICAL DATA:  Possible sepsis EXAM: CHEST - 2 VIEW COMPARISON:  07/17/2018 FINDINGS: Cardiac shadows within normal limits. Right chest wall port is noted in satisfactory position. The lungs are well aerated bilaterally. No focal infiltrate or sizable effusion is seen. No acute bony abnormality is noted. IMPRESSION: No active cardiopulmonary disease. Electronically Signed   By: Inez Catalina M.D.   On: 09/17/2018 12:03   Ct Head Wo Contrast  Result Date: 09/17/2018 CLINICAL DATA:  Altered level of consciousness EXAM: CT HEAD WITHOUT CONTRAST TECHNIQUE: Contiguous axial images were obtained from the base of the skull through the vertex without intravenous contrast. COMPARISON:  July 17, 2018 FINDINGS: Brain: There is mild diffuse atrophy. There is no intracranial mass, hemorrhage, extra-axial fluid collection, or midline shift. There is patchy small vessel disease in the centra semiovale bilaterally. No acute infarct is demonstrable on this study. Vascular: There is no hyperdense vessel. There is calcification in each distal vertebral artery and carotid siphon region. Skull: The bony calvarium appears intact. Sinuses/Orbits: There is mucosal thickening in each medial maxillary antrum. There is opacification and mucosal thickening in several ethmoid air cells. Orbits appear symmetric bilaterally. Other: Mastoid air cells are clear. IMPRESSION: Stable atrophy  with periventricular small vessel disease. No acute infarct evident. No mass or hemorrhage. There are foci of arterial vascular calcification. There are foci of paranasal sinus disease. Electronically Signed   By: Lowella Grip III M.D.   On: 09/17/2018 13:53        Scheduled Meds: . amLODipine  5 mg Oral QPM  . cromolyn  1 drop Both Eyes QID  . enoxaparin (LOVENOX) injection  40 mg Subcutaneous Q24H  . famotidine  20 mg Oral BID  . pantoprazole  40 mg Oral Daily  . tamsulosin  0.4 mg Oral QHS   Continuous Infusions: . sodium chloride Stopped (09/17/18 1419)  . sodium chloride 75 mL/hr at 09/18/18 0600  . cefTRIAXone (ROCEPHIN)  IV 2 g (09/18/18 0839)     LOS: 1 day    Time spent: 40 mins    Irine Seal, MD [facility]  If 7PM-7AM, please contact night-coverage [email or web address] 09/18/2018, 4:27 PM

## 2018-09-19 DIAGNOSIS — E43 Unspecified severe protein-calorie malnutrition: Secondary | ICD-10-CM

## 2018-09-19 LAB — CBC WITH DIFFERENTIAL/PLATELET
Abs Immature Granulocytes: 0.18 10*3/uL — ABNORMAL HIGH (ref 0.00–0.07)
Basophils Absolute: 0 10*3/uL (ref 0.0–0.1)
Basophils Relative: 0 %
Eosinophils Absolute: 0 10*3/uL (ref 0.0–0.5)
Eosinophils Relative: 0 %
HCT: 31.5 % — ABNORMAL LOW (ref 39.0–52.0)
Hemoglobin: 9.8 g/dL — ABNORMAL LOW (ref 13.0–17.0)
IMMATURE GRANULOCYTES: 1 %
Lymphocytes Relative: 8 %
Lymphs Abs: 1.1 10*3/uL (ref 0.7–4.0)
MCH: 30.8 pg (ref 26.0–34.0)
MCHC: 31.1 g/dL (ref 30.0–36.0)
MCV: 99.1 fL (ref 80.0–100.0)
Monocytes Absolute: 0.9 10*3/uL (ref 0.1–1.0)
Monocytes Relative: 7 %
NEUTROS PCT: 84 %
Neutro Abs: 10.9 10*3/uL — ABNORMAL HIGH (ref 1.7–7.7)
PLATELETS: 309 10*3/uL (ref 150–400)
RBC: 3.18 MIL/uL — AB (ref 4.22–5.81)
RDW: 15.4 % (ref 11.5–15.5)
WBC: 13.1 10*3/uL — ABNORMAL HIGH (ref 4.0–10.5)
nRBC: 0 % (ref 0.0–0.2)

## 2018-09-19 LAB — BASIC METABOLIC PANEL
Anion gap: 5 (ref 5–15)
BUN: 12 mg/dL (ref 8–23)
CO2: 23 mmol/L (ref 22–32)
Calcium: 10.1 mg/dL (ref 8.9–10.3)
Chloride: 108 mmol/L (ref 98–111)
Creatinine, Ser: 0.56 mg/dL — ABNORMAL LOW (ref 0.61–1.24)
GFR calc Af Amer: >60 mL/min (ref >60)
GFR calc non Af Amer: >60 mL/min (ref >60)
Glucose, Bld: 146 mg/dL — ABNORMAL HIGH (ref 70–99)
Potassium: 3 mmol/L — ABNORMAL LOW (ref 3.5–5.1)
Sodium: 136 mmol/L (ref 135–145)

## 2018-09-19 LAB — GLUCOSE, CAPILLARY
Glucose-Capillary: 140 mg/dL — ABNORMAL HIGH (ref 70–99)
Glucose-Capillary: 196 mg/dL — ABNORMAL HIGH (ref 70–99)
Glucose-Capillary: 273 mg/dL — ABNORMAL HIGH (ref 70–99)
Glucose-Capillary: 235 mg/dL — ABNORMAL HIGH (ref 70–99)

## 2018-09-19 LAB — MAGNESIUM: Magnesium: 1.3 mg/dL — ABNORMAL LOW (ref 1.7–2.4)

## 2018-09-19 MED ORDER — POTASSIUM CHLORIDE CRYS ER 20 MEQ PO TBCR
40.0000 meq | EXTENDED_RELEASE_TABLET | ORAL | Status: AC
  Administered 2018-09-19 (×2): 40 meq via ORAL
  Filled 2018-09-19 (×2): qty 2

## 2018-09-19 MED ORDER — INSULIN ASPART 100 UNIT/ML ~~LOC~~ SOLN: 0.0000 [IU] | Freq: Three times a day (TID) | SUBCUTANEOUS | Status: DC

## 2018-09-19 MED ORDER — MAGNESIUM SULFATE 4 GM/100ML IV SOLN
4.0000 g | Freq: Once | INTRAVENOUS | Status: AC
  Administered 2018-09-19: 4 g via INTRAVENOUS
  Filled 2018-09-19: qty 100

## 2018-09-19 MED ORDER — ENSURE ENLIVE PO LIQD
237.0000 mL | Freq: Three times a day (TID) | ORAL | Status: DC
  Administered 2018-09-19 – 2018-09-27 (×21): 237 mL via ORAL

## 2018-09-19 NOTE — Progress Notes (Addendum)
PROGRESS NOTE    Sean Cooke  PIR:[id number] DOB: 1926/12/25 DOA: 09/17/2018 PCP: Erda    Brief Narrative:  Patient is a 85 year old resident of [facility] usually alert oriented, prior history of adenocarcinoma stage III of the lung, newly diagnosed stage I T2N0 pancreatic cancer status post XRT 08/19/2018, supposed to return to oncology clinic 09/19/2018, history of BPH, diastolic heart failure, recent hospitalization 07/21/2018 for scrotal cellulitis and need of indwelling Foley catheter who presented to the ED with confusion, fever 104 and Foley noted to have frank pus around it which was changed at the facility.  On admission patient noted to be septic and sepsis work-up underway.  Patient placed empirically on IV antibiotics.   Assessment & Plan:   Principal Problem:   Sepsis ([place]) Active Problems:   BPH (benign prostatic hyperplasia)   Malignant neoplasm of right upper lobe of lung (HCC)   Pressure injury of skin   Bacteremia: Probable   Acute lower UTI   Type 2 diabetes mellitus without complication (HCC)   Protein-calorie malnutrition, severe  #1 sepsis secondary to bacteremia and UTI. Patient still with some confusion however improving daily.  Patient more alert and following commands more appropriately today.  Patient however not at baseline at this time.  On admission patient noted to have a fever of 104, confusion, tachycardia and tachypneic.  Patient pancultured with preliminary blood cultures with gram-negative rods and BCID positive for Enterobacter species and Proteus.  Fever curve trending down with some fluctuations.  Leukocytosis trending down.  Heart rate trending down.  Continue empiric IV Rocephin, gentle hydration.  If patient with continued fevers may need to image his abdomen to rule out abscess.  Follow.  2.  Acute toxic metabolic encephalopathy Likely secondary to problem #1.  Patient improving daily.  Patient  more alert.  Patient tolerated clear liquids and full liquid diet.  Patient seen by speech therapy.  Diet advanced to dysphagia 3 diet.  Continue IV antibiotics.  Follow.   3.  Hypertension Continue Norvasc.  Continue to hold Cozaar for now.  Follow.    4.  Depression/insomnia Continue to hold trazodone.  Follow.  5.  History of non-small cell lung cancer stage IIIb status post chemotherapy in 2016 Oncology has been informed of patient's admission via epic.  Outpatient follow-up.  6.  Recent diagnosis of pancreatic cancer status post XRT Admitting physician stated he sent an inbox message to Dr. Lisbeth Renshaw.  7.  Diabetes mellitus type 2 Hemoglobin A1c was 6.1 on 06/13/2018.  Diet to be advanced to a dysphagia 3 diet.  Place on sliding scale insulin.   8.  Senile osteoporosis Continue to hold Prolia at this time.  9.  Anemia Likely dilutional.  Patient with no overt bleeding.  Check an anemia panel.  Follow H&H.  10.  Hypokalemia/hypomagnesemia Replete.    DVT prophylaxis: Lovenox Code Status: DNR Family Communication: No family at bedside. Disposition Plan: To be determined.   Consultants:   None  Procedures:   CT head 09/17/2018  Chest x-ray 09/17/2018  Antimicrobials:  IV cefepime 09/17/2018 x 1 dose  IV Rocephin 09/18/2018  IV vancomycin 09/17/2018>>>> 09/18/2018   Subjective: Patient laying in bed.  Patient more alert today.  Patient following more commands today.  And answering some questions appropriately.  Objective: Vitals:   09/18/18 2038 09/19/18 0218 09/19/18 0621 09/19/18 0900  BP: 127/68 (!) 123/55 120/71 127/71  Pulse: 99 100 (!) 102 (!) 104  Resp: 18 20 18 20   Temp: 97.8 F (36.6 C) 99.6 F (37.6 C) 97.7 F (36.5 C) 100.3 F (37.9 C)  TempSrc: Oral Oral Oral Rectal  SpO2: 95% 92% 92% 96%  Weight:      Height:        Intake/Output Summary (Last 24 hours) at 09/19/2018 1316 Last data filed at 09/19/2018 1152 Gross per 24 hour  Intake 2438.57 ml    Output 735 ml  Net 1703.57 ml   Filed Weights   09/17/18 1140  Weight: 66.2 kg    Examination:  General exam: Less confused.  Dry mucous membranes. Respiratory system: CTAB.  No wheezes, no crackles no rhonchi.  Normal respiratory effort.  Cardiovascular system: RRR  no murmurs rubs or gallops.  No JVD.  No lower extremity edema. Gastrointestinal system: Abdomen is soft, nontender, nondistended, positive bowel sounds.  No rebound.  No guarding.  Central nervous system: Alert and oriented. No focal neurological deficits. Extremities: Symmetric 5 x 5 power. Skin: No rashes, lesions or ulcers Psychiatry: Judgement and insight appear poor to fair. Mood & affect appropriate.     Data Reviewed: I have personally reviewed following labs and imaging studies  CBC: Recent Labs  Lab 09/17/18 1149 09/18/18 0452 09/19/18 0412  WBC 18.7* 16.9* 13.1*  NEUTROABS 16.4* 15.1* 10.9*  HGB 10.7* 10.4* 9.8*  HCT 34.5* 33.3* 31.5*  MCV 100.6* 98.8 99.1  PLT 468* 374 638   Basic Metabolic Panel: Recent Labs  Lab 09/17/18 1149 09/18/18 0452 09/19/18 0412  NA 134* 136 136  K 3.5 3.9 3.0*  CL 97* 105 108  CO2 28 25 23   GLUCOSE 154* 149* 146*  BUN 17 14 12   CREATININE 0.94 0.77 0.56*  CALCIUM 11.6* 10.5* 10.1  MG  --   --  1.3*   GFR: Estimated Creatinine Clearance: 58.5 mL/min (A) (by C-G formula based on SCr of 0.56 mg/dL (L)). Liver Function Tests: Recent Labs  Lab 09/17/18 1149 09/18/18 0452  AST 27 45*  ALT 18 20  ALKPHOS 86 68  BILITOT 0.8 0.9  PROT 7.2 5.8*  ALBUMIN 3.3* 2.5*   No results for input(s): LIPASE, AMYLASE in the last 168 hours. No results for input(s): AMMONIA in the last 168 hours. Coagulation Profile: Recent Labs  Lab 09/17/18 1149 09/18/18 0452  INR 1.20 1.33   Cardiac Enzymes: No results for input(s): CKTOTAL, CKMB, CKMBINDEX, TROPONINI in the last 168 hours. BNP (last 3 results) No results for input(s): PROBNP in the last 8760  hours. HbA1C: No results for input(s): HGBA1C in the last 72 hours. CBG: Recent Labs  Lab 09/18/18 1730 09/18/18 2121 09/19/18 0816 09/19/18 1214  GLUCAP 123* 140* 140* 196*   Lipid Profile: No results for input(s): CHOL, HDL, LDLCALC, TRIG, CHOLHDL, LDLDIRECT in the last 72 hours. Thyroid Function Tests: No results for input(s): TSH, T4TOTAL, FREET4, T3FREE, THYROIDAB in the last 72 hours. Anemia Panel: No results for input(s): VITAMINB12, FOLATE, FERRITIN, TIBC, IRON, RETICCTPCT in the last 72 hours. Sepsis Labs: Recent Labs  Lab 09/17/18 1125 09/17/18 1342 09/18/18 0452  PROCALCITON  --   --  0.39  LATICACIDVEN 2.1* 1.4  --     Recent Results (from the past 240 hour(s))  Culture, blood (Routine x 2)     Status: Abnormal (Preliminary result)   Collection Time: 09/17/18 11:28 AM  Result Value Ref Range Status   Specimen Description   Final    BLOOD BLOOD RIGHT FOREARM Performed at [facility], [place] [address] [place], [place] [address]    Special Requests   Final    BOTTLES DRAWN AEROBIC AND ANAEROBIC Blood Culture adequate volume Performed at [place] [address], [place], [place] [address]    Culture  Setup Time   Final    GRAM NEGATIVE RODS IN BOTH AEROBIC AND ANAEROBIC BOTTLES CRITICAL RESULT CALLED TO, READ BACK BY AND VERIFIED WITH: B. GREEN,PHARMD 0425 09/18/2018 T. TYSOR    Culture (A)  Final    PROTEUS MIRABILIS SUSCEPTIBILITIES TO FOLLOW Performed at [facility], [place] [address] [place], [place] [address]    Report Status PENDING  Incomplete  Blood Culture ID Panel (Reflexed)     Status: Abnormal   Collection Time: 09/17/18 11:28 AM  Result Value Ref Range Status   Enterococcus species NOT DETECTED NOT DETECTED Final   Listeria monocytogenes NOT DETECTED NOT DETECTED Final   Staphylococcus species NOT DETECTED NOT DETECTED Final   Staphylococcus aureus (BCID) NOT DETECTED NOT DETECTED Final    Streptococcus species NOT DETECTED NOT DETECTED Final   Streptococcus agalactiae NOT DETECTED NOT DETECTED Final   Streptococcus pneumoniae NOT DETECTED NOT DETECTED Final   Streptococcus pyogenes NOT DETECTED NOT DETECTED Final   Acinetobacter baumannii NOT DETECTED NOT DETECTED Final   Enterobacteriaceae species DETECTED (A) NOT DETECTED Final    Comment: Enterobacteriaceae represent a large family of gram-negative bacteria, not a single organism. CRITICAL RESULT CALLED TO, READ BACK BY AND VERIFIED WITH: B. GREEN,PHARMD 0425 09/18/2018 T. TYSOR    Enterobacter cloacae complex NOT DETECTED NOT DETECTED Final   Escherichia coli NOT DETECTED NOT DETECTED Final   Klebsiella oxytoca NOT DETECTED NOT DETECTED Final   Klebsiella pneumoniae NOT DETECTED NOT DETECTED Final   Proteus species DETECTED (A) NOT DETECTED Final    Comment: CRITICAL RESULT CALLED TO, READ BACK BY AND VERIFIED WITH: B. GREEN,PHARMD 0425 09/18/2018 T. TYSOR    Serratia marcescens NOT DETECTED NOT DETECTED Final   Carbapenem resistance NOT DETECTED NOT DETECTED Final   Haemophilus influenzae NOT DETECTED NOT DETECTED Final   Neisseria meningitidis NOT DETECTED NOT DETECTED Final   Pseudomonas aeruginosa NOT DETECTED NOT DETECTED Final   Candida albicans NOT DETECTED NOT DETECTED Final   Candida glabrata NOT DETECTED NOT DETECTED Final   Candida krusei NOT DETECTED NOT DETECTED Final   Candida parapsilosis NOT DETECTED NOT DETECTED Final   Candida tropicalis NOT DETECTED NOT DETECTED Final    Comment: Performed at [facility], [place] [address] [place], [place] [address]  Culture, blood (Routine x 2)     Status: Abnormal (Preliminary result)   Collection Time: 09/17/18 11:34 AM  Result Value Ref Range Status   Specimen Description   Final    BLOOD RIGHT HAND Performed at [facility] [address] [place], [place] [address]    Special Requests   Final    BOTTLES DRAWN AEROBIC AND  ANAEROBIC Blood Culture results may not be optimal due to an inadequate volume of blood received in culture bottles Performed at [facility] [address] [place],  [address]    Culture  Setup Time   Final    GRAM NEGATIVE RODS IN BOTH AEROBIC AND ANAEROBIC BOTTLES CRITICAL VALUE NOTED.  VALUE IS CONSISTENT WITH PREVIOUSLY REPORTED AND CALLED VALUE.    Culture (A)  Final    PROTEUS MIRABILIS SUSCEPTIBILITIES TO FOLLOW Performed at [facility], [place]  [address], [place], [place] [address]    Report Status PENDING  Incomplete  Culture, Urine     Status: Abnormal (Preliminary result)   Collection Time: 09/17/18  6:40 PM  Result Value Ref Range Status   Specimen Description   Final    URINE, CATHETERIZED Performed at [facility], [place] [address], [place], [place] [address]    Special Requests   Final    Normal Performed at [facility], [place] [address] [place], [place] [address]    Culture >=100,000 COLONIES/mL GRAM NEGATIVE RODS (A)  Final   Report Status PENDING  Incomplete  MRSA PCR Screening     Status: None   Collection Time: 09/17/18  9:27 PM  Result Value Ref Range Status   MRSA by PCR NEGATIVE NEGATIVE Final    Comment:        The GeneXpert MRSA Assay (FDA approved for NASAL specimens only), is one component of a comprehensive MRSA colonization surveillance program. It is not intended to diagnose MRSA infection nor to guide or monitor treatment for MRSA infections. Performed at [facility], [place] [address] [place],  [address]          Radiology Studies: Ct Head Wo Contrast  Result Date: 09/17/2018 CLINICAL DATA:  Altered level of consciousness EXAM: CT HEAD WITHOUT CONTRAST TECHNIQUE: Contiguous axial images were obtained from the base of the skull through the vertex without intravenous contrast. COMPARISON:  July 17, 2018 FINDINGS: Brain: There is mild diffuse  atrophy. There is no intracranial mass, hemorrhage, extra-axial fluid collection, or midline shift. There is patchy small vessel disease in the centra semiovale bilaterally. No acute infarct is demonstrable on this study. Vascular: There is no hyperdense vessel. There is calcification in each distal vertebral artery and carotid siphon region. Skull: The bony calvarium appears intact. Sinuses/Orbits: There is mucosal thickening in each medial maxillary antrum. There is opacification and mucosal thickening in several ethmoid air cells. Orbits appear symmetric bilaterally. Other: Mastoid air cells are clear. IMPRESSION: Stable atrophy with periventricular small vessel disease. No acute infarct evident. No mass or hemorrhage. There are foci of arterial vascular calcification. There are foci of paranasal sinus disease. Electronically Signed   By: Lowella Grip III M.D.   On: 09/17/2018 13:53        Scheduled Meds: . amLODipine  5 mg Oral QPM  . cromolyn  1 drop Both Eyes QID  . enoxaparin (LOVENOX) injection  40 mg Subcutaneous Q24H  . famotidine  20 mg Oral BID  . feeding supplement (ENSURE ENLIVE)  237 mL Oral TID BM  . insulin aspart  0-9 Units Subcutaneous TID WC  . pantoprazole  40 mg Oral Daily  . potassium chloride  40 mEq Oral Q4H  . tamsulosin  0.4 mg Oral QHS   Continuous Infusions: . sodium chloride Stopped (09/17/18 1419)  . sodium chloride 75 mL/hr at 09/19/18 1228  . cefTRIAXone (ROCEPHIN)  IV Stopped (09/19/18 0916)     LOS: 2 days    Time spent: 40 mins    Irine Seal, MD [facility]  If 7PM-7AM, please contact night-coverage [email or web address] 09/19/2018, 1:16 PM

## 2018-09-19 NOTE — Progress Notes (Signed)
Initial Nutrition Assessment  DOCUMENTATION CODES:   Severe malnutrition in context of chronic illness  INTERVENTION:    Ensure Enlive po TID, each supplement provides 350 kcal and 20 grams of protein  Magic cup BID with meals, each supplement provides 290 kcal and 9 grams of protein  NUTRITION DIAGNOSIS:   Severe Malnutrition related to chronic illness, cancer and cancer related treatments as evidenced by energy intake < or equal to 75% for > or equal to 1 month, percent weight loss, moderate fat depletion, severe muscle depletion.  GOAL:   Patient will meet greater than or equal to 90% of their needs  MONITOR:   PO intake, Supplement acceptance, Weight trends, Labs, I & O's, Skin  REASON FOR ASSESSMENT:   (PI report)    ASSESSMENT:   Patient with PMH significant for GERD, HTN, CHF, prior stage III lung cancer, and newly diagnosed stage I pancreatic cancer s/p radiation. Recently hospitalized through 08/02/2017 with scrotal cellulitis and need an indwelling Foley catheter. Presents this admission with sepsis secondary to UTI.    History discussed with wife at bedside. Wife reports pt's appetite began to decline 6 weeks ago while undergoing radiation. States since that time pt was severed three meals at nursing facility and consumed bites/sips. She is unsure if he was provided supplements. Wife denies pt had issues with swallowing. Discussed the importance of protein intake for preservation of lean body mass and wound healing. RD provided pt with strawberry Ensure which he enjoyed.   Wife endorses pt lost 20 lb from his UBW of 165 lb in this 6 week period. Records indicate pt weighed 163 lb on 05/22/18 and 146 lb this admission (10.4% wt loss in 4 months, significant for time frame). Nutrition-Focused physical exam completed.   Medications reviewed and include: SS novolog, NS @ 75 ml/hr, Mg sulfate  Labs reviewed: K 3.0 (L) Mg 1.3 (L) CBG 123-196  NUTRITION - FOCUSED PHYSICAL  EXAM:    Most Recent Value  Orbital Region  Mild depletion  Upper Arm Region  Severe depletion  Thoracic and Lumbar Region  Unable to assess  Buccal Region  Moderate depletion  Temple Region  Moderate depletion  Clavicle Bone Region  Severe depletion  Clavicle and Acromion Bone Region  Severe depletion  Scapular Bone Region  Unable to assess  Dorsal Hand  Moderate depletion  Patellar Region  Severe depletion  Anterior Thigh Region  Severe depletion  Posterior Calf Region  Severe depletion  Edema (RD Assessment)  None     Diet Order:   Diet Order            DIET DYS 3 Room service appropriate? Yes; Fluid consistency: Thin  Diet effective now              EDUCATION NEEDS:   Education needs have been addressed  Skin:  Skin Assessment: Skin Integrity Issues: Skin Integrity Issues:: Stage II Stage II: sacrum  Last BM:  2/6  Height:   Ht Readings from Last 1 Encounters:  09/17/18 5\' 7"  (1.702 m)    Weight:   Wt Readings from Last 1 Encounters:  09/17/18 66.2 kg    Ideal Body Weight:  67.3 kg  BMI:  Body mass index is 22.86 kg/m.  Estimated Nutritional Needs:   Kcal:  1950-2150 kcal  Protein:  95-110 grams  Fluid:  >/= 1.9 L/day   Mariana Single RD, LDN Clinical Nutrition Pager # - [phone]

## 2018-09-19 NOTE — Evaluation (Signed)
Physical Therapy Evaluation Patient Details Name: MIKYLE Cooke MRN: [id number] DOB: 09-14-1926 Today's Date: 09/19/2018   History of Present Illness  85 yo male admitted with sepsis, acute encephalopathy. Hx of lung ca, pancreatic ca, DDD, BPH  Clinical Impression  On eval, pt required Min assist for mobility. He was able to take a few ambulatory steps with use of a RW. Pt presents with weakness, decreased activity tolerance, and impaired gait and balance. Discussed d/c plan with wife prior to session. She stated she feels pt needs a higher level of care than he is receiving at ALF. Will recommend SNF at this time. Will continue to follow during hospital stay.     Follow Up Recommendations SNF    Equipment Recommendations  None recommended by PT    Recommendations for Other Services       Precautions / Restrictions Precautions Precautions: Fall Restrictions Weight Bearing Restrictions: No      Mobility  Bed Mobility Overal bed mobility: Needs Assistance Bed Mobility: Supine to Sit;Sit to Supine     Supine to sit: Min assist;Mod assist Sit to supine: Min assist;HOB elevated   General bed mobility comments: Assist for trunk and LEs. Increased time. Multimodal cueing required.   Transfers Overall transfer level: Needs assistance Equipment used: Rolling walker (2 wheeled) Transfers: Sit to/from Stand Sit to Stand: Min assist;From elevated surface         General transfer comment: Assist to rise, stabilize, control descent. Multimodal cueing required.   Ambulation/Gait Ambulation/Gait assistance: Min assist Gait Distance (Feet): 3 Feet Assistive device: Rolling walker (2 wheeled) Gait Pattern/deviations: Step-to pattern;Trunk flexed     General Gait Details: Pt took a few forwards then backwards steps. He also took a few side steps towards with HOB. RW used for safety, balance. Multimodal cueing required.   Stairs            Wheelchair Mobility     Modified Rankin (Stroke Patients Only)       Balance Overall balance assessment: Needs assistance         Standing balance support: Bilateral upper extremity supported Standing balance-Leahy Scale: Poor                               Pertinent Vitals/Pain Pain Assessment: No/denies pain    Home Living Family/patient expects to be discharged to:: Unsure               Home Equipment: Gilford Rile - 2 wheels      Prior Function Level of Independence: Needs assistance   Gait / Transfers Assistance Needed: ambulatory with RW (per wife)     Comments: pt is a poor historian     Hand Dominance        Extremity/Trunk Assessment   Upper Extremity Assessment Upper Extremity Assessment: Generalized weakness    Lower Extremity Assessment Lower Extremity Assessment: Generalized weakness    Cervical / Trunk Assessment Cervical / Trunk Assessment: Kyphotic  Communication   Communication: HOH  Cognition Arousal/Alertness: Awake/alert Behavior During Therapy: WFL for tasks assessed/performed Overall Cognitive Status: No family/caregiver present to determine baseline cognitive functioning Area of Impairment: Orientation;Memory;Safety/judgement;Problem solving                 Orientation Level: Disoriented to;Place;Time;Situation   Memory: Decreased short-term memory   Safety/Judgement: Decreased awareness of safety   Problem Solving: Requires verbal cues;Requires tactile cues        General  Comments      Exercises     Assessment/Plan    PT Assessment Patient needs continued PT services  PT Problem List Decreased strength;Decreased balance;Decreased mobility;Decreased activity tolerance;Decreased knowledge of use of DME;Decreased cognition;Decreased safety awareness       PT Treatment Interventions DME instruction;Gait training;Functional mobility training;Therapeutic activities;Balance training;Patient/family education;Therapeutic  exercise    PT Goals (Current goals can be found in the Care Plan section)  Acute Rehab PT Goals Patient Stated Goal: wife feels pt requires more care then he is receiving at ALF PT Goal Formulation: With family Time For Goal Achievement: 10/03/18 Potential to Achieve Goals: Fair    Frequency Min 2X/week   Barriers to discharge        Co-evaluation               AM-PAC PT "6 Clicks" Mobility  Outcome Measure Help needed turning from your back to your side while in a flat bed without using bedrails?: A Little Help needed moving from lying on your back to sitting on the side of a flat bed without using bedrails?: A Little Help needed moving to and from a bed to a chair (including a wheelchair)?: A Little Help needed standing up from a chair using your arms (e.g., wheelchair or bedside chair)?: A Little Help needed to walk in hospital room?: A Little Help needed climbing 3-5 steps with a railing? : A Lot 6 Click Score: 17    End of Session Equipment Utilized During Treatment: Gait belt Activity Tolerance: Patient tolerated treatment well Patient left: in bed;with call bell/phone within reach;with bed alarm set   PT Visit Diagnosis: Unsteadiness on feet (R26.81);Muscle weakness (generalized) (M62.81);Difficulty in walking, not elsewhere classified (R26.2)    Time: 4193-7902 PT Time Calculation (min) (ACUTE ONLY): 11 min   Charges:   PT Evaluation $PT Eval Moderate Complexity: [place], PT Acute Rehabilitation Services Pager: [phone] Office: [phone]

## 2018-09-19 NOTE — Progress Notes (Signed)
OT Cancellation Note  Patient Details Name: Sean Cooke MRN: [id number] DOB: 07/05/1927   Cancelled Treatment:    Reason Eval/Treat Not Completed: Other (comment) Met with pt lying in bed, appearing confused and disoriented to time. Pt refusing therapy despite multiple attempts for OOB activity. Will continue to follow as pt available and appropriate to initiate OT POC.   Kaylee Roebuck, MSOT, OTR/L Behavioral Health OT/ Acute Relief OT [facility] Office: [phone]   Kaylee Roebuck 09/19/2018, 2:28 PM 

## 2018-09-19 NOTE — Progress Notes (Signed)
Assessment was done/charted prior to arrival. My assessment perfformed is agreed with the charted (previous) assessment.

## 2018-09-19 NOTE — Evaluation (Signed)
Clinical/Bedside Swallow Evaluation Patient Details  Name: Sean Cooke MRN: [id number] Date of Birth: 05-29-1927  Today's Date: 09/19/2018 Time: SLP Start Time (ACUTE ONLY): 1210 SLP Stop Time (ACUTE ONLY): 1238 SLP Time Calculation (min) (ACUTE ONLY): 28 min  Past Medical History:  Past Medical History:  Diagnosis Date  . Anemia   . Arthritis   . Benign localized prostatic hyperplasia with lower urinary tract symptoms (LUTS)   . Bilateral edema of lower extremity   . Borderline diabetes   . Chronic low back pain   . DDD (degenerative disc disease), lumbar   . Degenerative joint disease   . Diverticulosis   . Duodenal diverticulum   . Encounter for antineoplastic chemotherapy 07/2015   lung cancer--- per pt completed chemo 06/ 2018  . Fatigue   . Generalized weakness   . GERD (gastroesophageal reflux disease)   . Hemorrhoids   . Herniated intervertebral disc of lumbar spine    L4-5  . Hiatal hernia   . History of adenomatous polyp of colon   . Hypertension   . Lumbar stenosis    L4-5  . 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 Pancreatic cancer [facility]) oncologist-  dr Julien Nordmann   new dx 09/ 2019  . Poor dental hygiene   . Wears hearing aid in both ears    Past Surgical History:  Past Surgical History:  Procedure Laterality Date  . CATARACT EXTRACTION W/ INTRAOCULAR LENS  IMPLANT, BILATERAL  2004  . ESOPHAGOGASTRODUODENOSCOPY (EGD) WITH PROPOFOL N/A 07/08/2018   Procedure: ESOPHAGOGASTRODUODENOSCOPY (EGD) WITH PROPOFOL;  Surgeon: Rush Landmark Telford Nab., MD;  Location: [facility];  Service: Gastroenterology;  Laterality: N/A;  . EUS N/A 05/16/2018   Procedure: UPPER ENDOSCOPIC ULTRASOUND (EUS) RADIAL;  Surgeon: Milus Banister, MD;  Location: [facility];  Service: Endoscopy;  Laterality:  N/A;  . EUS N/A 07/08/2018   Procedure: UPPER ENDOSCOPIC ULTRASOUND (EUS) RADIAL;  Surgeon: Rush Landmark Telford Nab., MD;  Location: [facility];  Service: Gastroenterology;  Laterality: N/A;  . FINE NEEDLE ASPIRATION N/A 05/16/2018   Procedure: FINE NEEDLE ASPIRATION (FNA) LINEAR;  Surgeon: Milus Banister, MD;  Location: [facility];  Service: Endoscopy;  Laterality: N/A;  . INGUINAL HERNIA REPAIR  1980s   unilateral , pt unsure which side  . LAPAROSCOPIC CHOLECYSTECTOMY  07-02-2003   dr gerkin @[facility]   . SPLENECTOMY  10-11-2000   @[facility]    via exploratory laparotomy for blunt abdominal trauma  . TRANSURETHRAL RESECTION OF PROSTATE  1988   HPI:  85 yo male adm to [facility] with metabolic encephalopathy likely due to urinary tract infection.  Pt was severely altered upon admit per RN.  Pt PMH + for lung cancer of right upper lobe, pancreatic adenocarcinoma, cellulitis, GERD, malnutrition, anemia, scotum cellulitis.  Pt admits to weight loss prior to admit and states he doesn't always have an appetite.  Pt was on clear liquids - advanced to full and SlP swallow eval was ordered.     Assessment / Plan / Recommendation Clinical Impression  Patient presents with functional oropharyngeal swallow ability based on clinical swallow evaluation.  His voice and cough are strong thankfully although he states vocal strengthi s 7/10 (10 being normal).  Given his reflux hx, SLP advised he stay upright during and after all meals.  Pt also states some problems  with some pills - advised to take with puree.  He denies dysphagia to foods/drinks alone.  Poor dentition and mild dysarthria noted thus recommend pt have dys3 diet.  He was agreeable.  No SLP follow up needed for swallowing, speech/cog evaluation to be completed.  Thanks.    SLP Visit Diagnosis: Dysphagia, unspecified (R13.10)    Aspiration Risk  No limitations    Diet Recommendation Dysphagia 3 (Mech soft);Thin liquid(due to poor dentition and minimal  dysarthria)   Liquid Administration via: Cup;Straw Supervision: Patient able to self feed Compensations: Slow rate;Small sips/bites Postural Changes: Seated upright at 90 degrees;Remain upright for at least 30 minutes after po intake    Other  Recommendations Oral Care Recommendations: Oral care BID   Follow up Recommendations None(for swallowing)      Frequency and Duration     none for swallowing       Prognosis   good for swallowing ability     Swallow Study   General Date of Onset: 09/19/18 HPI: 85 yo male adm to [facility] with metabolic encephalopathy likely due to urinary tract infection.  Pt was severely altered upon admit per RN.  Pt PMH + for lung cancer of right upper lobe, pancreatic adenocarcinoma, cellulitis, GERD, malnutrition, anemia, scotum cellulitis.  Pt admits to weight loss prior to admit and states he doesn't always have an appetite.  Pt was on clear liquids - advanced to full and SlP swallow eval was ordered.   Type of Study: Bedside Swallow Evaluation Previous Swallow Assessment: prior UGI 2004 hiatal hernia, gerd Diet Prior to this Study: Thin liquids(full liquid) Temperature Spikes Noted: No Respiratory Status: Room air History of Recent Intubation: No Behavior/Cognition: Alert;Cooperative;Pleasant mood Oral Cavity Assessment: Within Functional Limits Oral Care Completed by SLP: No Oral Cavity - Dentition: Missing dentition;Other (Comment)(some missing dentition, pt does not use denturews) Vision: Functional for self-feeding Self-Feeding Abilities: Able to feed self Patient Positioning: Upright in bed Baseline Vocal Quality: Low vocal intensity(pt reports voice strength to be level 7 of 10 being normal) Volitional Cough: Strong Volitional Swallow: Able to elicit    Oral/Motor/Sensory Function Overall Oral Motor/Sensory Function: Within functional limits   Ice Chips Ice chips: Not tested   Thin Liquid Thin Liquid: Within functional limits Presentation:  Self Fed;Straw;Cup Other Comments: 3 ounce water test passed without difficulties    Nectar Thick Nectar Thick Liquid: Not tested   Honey Thick Honey Thick Liquid: Not tested   Puree Puree: Not tested Other Comments: pt declined to consume applesauce nor icecream, his articulation abilities are adequate thus infer oral transiting with puree is functional   Solid     Solid: Within functional limits Presentation: Self Fed      Macario Golds 09/19/2018,2:25 PM  Luanna Salk, [facility] [place] Pager [phone] Office [phone]

## 2018-09-20 LAB — BASIC METABOLIC PANEL
Anion gap: 4 — ABNORMAL LOW (ref 5–15)
BUN: 12 mg/dL (ref 8–23)
CO2: 23 mmol/L (ref 22–32)
Calcium: 9.7 mg/dL (ref 8.9–10.3)
Chloride: 109 mmol/L (ref 98–111)
Creatinine, Ser: 0.47 mg/dL — ABNORMAL LOW (ref 0.61–1.24)
GFR calc Af Amer: >60 mL/min (ref >60)
GFR calc non Af Amer: >60 mL/min (ref >60)
Glucose, Bld: 175 mg/dL — ABNORMAL HIGH (ref 70–99)
Potassium: 3.7 mmol/L (ref 3.5–5.1)
Sodium: 136 mmol/L (ref 135–145)

## 2018-09-20 LAB — CBC WITH DIFFERENTIAL/PLATELET
Abs Immature Granulocytes: 0.12 10*3/uL — ABNORMAL HIGH (ref 0.00–0.07)
Basophils Absolute: 0 10*3/uL (ref 0.0–0.1)
Basophils Relative: 0 %
Eosinophils Absolute: 0.4 10*3/uL (ref 0.0–0.5)
Eosinophils Relative: 4 %
HEMATOCRIT: 30 % — AB (ref 39.0–52.0)
Hemoglobin: 9.5 g/dL — ABNORMAL LOW (ref 13.0–17.0)
Immature Granulocytes: 1 %
LYMPHS ABS: 1.6 10*3/uL (ref 0.7–4.0)
Lymphocytes Relative: 15 %
MCH: 30.6 pg (ref 26.0–34.0)
MCHC: 31.7 g/dL (ref 30.0–36.0)
MCV: 96.8 fL (ref 80.0–100.0)
Monocytes Absolute: 1.2 10*3/uL — ABNORMAL HIGH (ref 0.1–1.0)
Monocytes Relative: 12 %
Neutro Abs: 7 10*3/uL (ref 1.7–7.7)
Neutrophils Relative %: 68 %
Platelets: 328 10*3/uL (ref 150–400)
RBC: 3.1 MIL/uL — ABNORMAL LOW (ref 4.22–5.81)
RDW: 15.7 % — ABNORMAL HIGH (ref 11.5–15.5)
WBC: 10.3 10*3/uL (ref 4.0–10.5)
nRBC: 0 % (ref 0.0–0.2)

## 2018-09-20 LAB — URINE CULTURE
Culture: >=100000 — AB
Special Requests: NORMAL

## 2018-09-20 LAB — GLUCOSE, CAPILLARY
Glucose-Capillary: 157 mg/dL — ABNORMAL HIGH (ref 70–99)
Glucose-Capillary: 248 mg/dL — ABNORMAL HIGH (ref 70–99)
Glucose-Capillary: 295 mg/dL — ABNORMAL HIGH (ref 70–99)

## 2018-09-20 LAB — CULTURE, BLOOD (ROUTINE X 2): SPECIAL REQUESTS: ADEQUATE

## 2018-09-20 LAB — RETICULOCYTES
Immature Retic Fract: 11.7 % (ref 2.3–15.9)
RBC.: 3.1 MIL/uL — ABNORMAL LOW (ref 4.22–5.81)
RETIC COUNT ABSOLUTE: 40.6 10*3/uL (ref 19.0–186.0)
Retic Ct Pct: 1.3 % (ref 0.4–3.1)

## 2018-09-20 LAB — FERRITIN: Ferritin: 898 ng/mL — ABNORMAL HIGH (ref 24–336)

## 2018-09-20 LAB — FOLATE: Folate: 20.4 ng/mL (ref >5.9)

## 2018-09-20 LAB — IRON AND TIBC
Iron: 56 ug/dL (ref 45–182)
Saturation Ratios: 39 % (ref 17.9–39.5)
TIBC: 144 ug/dL — ABNORMAL LOW (ref 250–450)
UIBC: 88 ug/dL

## 2018-09-20 LAB — VITAMIN B12: Vitamin B-12: 654 pg/mL (ref 180–914)

## 2018-09-20 LAB — MAGNESIUM: Magnesium: 1.6 mg/dL — ABNORMAL LOW (ref 1.7–2.4)

## 2018-09-20 MED ORDER — TRAZODONE HCL 50 MG PO TABS
50.0000 mg | ORAL_TABLET | Freq: Once | ORAL | Status: AC
  Administered 2018-09-21: 50 mg via ORAL
  Filled 2018-09-20: qty 1

## 2018-09-20 MED ORDER — CEFPODOXIME PROXETIL 200 MG PO TABS
400.0000 mg | ORAL_TABLET | Freq: Two times a day (BID) | ORAL | Status: AC
  Administered 2018-09-21 – 2018-09-24 (×8): 400 mg via ORAL
  Filled 2018-09-20 (×8): qty 2

## 2018-09-20 MED ORDER — MAGNESIUM SULFATE 4 GM/100ML IV SOLN
4.0000 g | Freq: Once | INTRAVENOUS | Status: AC
  Administered 2018-09-20: 4 g via INTRAVENOUS
  Filled 2018-09-20: qty 100

## 2018-09-20 NOTE — Progress Notes (Signed)
   09/20/18 1922  MEWS Score  Resp (!) 26  Pulse Rate 100  BP (!) 149/82  Temp 97.7 F (36.5 C)  SpO2 96 %  O2 Device Room Air  MEWS Score  MEWS RR 2  MEWS Pulse 0  MEWS Systolic 0  MEWS LOC 0  MEWS Temp 0  MEWS Score 2  MEWS Score Color Yellow   RN notified on-call NP Blount of Mews score of 2 due to increased respiratory rate. Patient otherwise in no distress, resting with eyes closed and easy to rouse. Will increase monitoring of vitals signs and carry out any new orders as needed.

## 2018-09-20 NOTE — Evaluation (Signed)
Occupational Therapy Evaluation Patient Details Name: Sean Cooke MRN: [id number] DOB: Jan 26, 1927 Today's Date: 09/20/2018    History of Present Illness 85 yo male admitted with sepsis, acute encephalopathy. Hx of lung ca, pancreatic ca, DDD, BPH   Clinical Impression   Pt was admitted for the above.  He lives in ALF at baseline:  Unsure of amount of ADL assistance needed. Pt currently needs min A for UB adls and SPT and max A for LB adls. Will follow in acute setting with min guard level goals for toileting and standing grooming tasks    Follow Up Recommendations  SNF    Equipment Recommendations  3 in 1 bedside commode    Recommendations for Other Services       Precautions / Restrictions Precautions Precautions: Fall Restrictions Weight Bearing Restrictions: No      Mobility Bed Mobility         Supine to sit: Min assist        Transfers   Equipment used: Rolling walker (2 wheeled)   Sit to Stand: Min assist;From elevated surface         General transfer comment: Assist to rise, stabilize, control descent.    Balance             Standing balance-Leahy Scale: Poor                             ADL either performed or assessed with clinical judgement   ADL Overall ADL's : Needs assistance/impaired Eating/Feeding: Independent   Grooming: Set up   Upper Body Bathing: Minimal assistance   Lower Body Bathing: Maximal assistance   Upper Body Dressing : Minimal assistance   Lower Body Dressing: Maximal assistance   Toilet Transfer: Minimal assistance;Stand-pivot;RW(recliner)   Toileting- Clothing Manipulation and Hygiene: Minimal assistance;Sit to/from stand         General ADL Comments: pt pleasant and cooperative; did not reach below knees due to back pain     Vision         Perception     Praxis      Pertinent Vitals/Pain Pain Assessment: Faces Faces Pain Scale: Hurts little more Pain Location: back Pain  Descriptors / Indicators: Sore Pain Intervention(s): Limited activity within patient's tolerance;Monitored during session;Repositioned     Hand Dominance     Extremity/Trunk Assessment Upper Extremity Assessment Upper Extremity Assessment: Generalized weakness           Communication Communication Communication: HOH   Cognition Arousal/Alertness: Awake/alert Behavior During Therapy: WFL for tasks assessed/performed Overall Cognitive Status: No family/caregiver present to determine baseline cognitive functioning                                 General Comments: pt oriented to self/place. 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  Consistently telling OT/RN same thing.  Cues for safety   General Comments       Exercises     Shoulder Instructions      Home Living                               Home Equipment: Walker - 2 wheels   Additional Comments: per chart, from ALF      Prior Functioning/Environment          Comments: unsure of PLOF  with adls. Pt states back hurts; he doesn't like to lean over        OT Problem List: Decreased strength;Decreased activity tolerance;Decreased cognition;Decreased safety awareness;Pain;Impaired balance (sitting and/or standing);Decreased knowledge of use of DME or AE      OT Treatment/Interventions: Self-care/ADL training;DME and/or AE instruction    OT Goals(Current goals can be found in the care plan section) Acute Rehab OT Goals Patient Stated Goal: none stated OT Goal Formulation: Patient unable to participate in goal setting Time For Goal Achievement: 10/04/18 Potential to Achieve Goals: Good ADL Goals Pt Will Perform Grooming: with min guard assist;standing Pt Will Transfer to Toilet: with min guard assist;ambulating;bedside commode Pt Will Perform Toileting - Clothing Manipulation and hygiene: with min guard assist;sit to/from stand  OT Frequency: Min 2X/week   Barriers to D/C:             Co-evaluation              AM-PAC OT "6 Clicks" Daily Activity     Outcome Measure Help from another person eating meals?: None Help from another person taking care of personal grooming?: A Little Help from another person toileting, which includes using toliet, bedpan, or urinal?: A Little Help from another person bathing (including washing, rinsing, drying)?: A Lot Help from another person to put on and taking off regular upper body clothing?: A Little Help from another person to put on and taking off regular lower body clothing?: A Lot 6 Click Score: 17   End of Session    Activity Tolerance: Patient tolerated treatment well Patient left: in chair;with call bell/phone within reach;with chair alarm set;with nursing/sitter in room  OT Visit Diagnosis: Muscle weakness (generalized) (M62.81)                Time: 3329-5188 OT Time Calculation (min): 27 min Charges:  OT General Charges $OT Visit: 1 Visit OT Evaluation $OT Eval Low Complexity: 1 Low OT Treatments $Self Care/Home Management : 8-22 mins  Lesle Chris, OTR/L Acute Rehabilitation Services [phone] [facility] pager [phone] office 09/20/2018  [place] 09/20/2018, 10:40 AM

## 2018-09-20 NOTE — Progress Notes (Signed)
PROGRESS NOTE    Sean Cooke  TKZ:[id number] DOB: 06/15/1927 DOA: 09/17/2018 PCP: [facility]    Brief Narrative:  Patient is a 85 year old resident of [facility] usually alert oriented, prior history of adenocarcinoma stage III of the lung, newly diagnosed stage I T2N0 pancreatic cancer status post XRT 08/19/2018, supposed to return to oncology clinic 09/19/2018, history of BPH, diastolic heart failure, recent hospitalization 07/21/2018 for scrotal cellulitis and need of indwelling Foley catheter who presented to the ED with confusion, fever 104 and Foley noted to have frank pus around it which was changed at the facility.  On admission patient noted to be septic and sepsis work-up underway.  Patient placed empirically on IV antibiotics.   Assessment & Plan:   Principal Problem:   Sepsis ([place]) Active Problems:   BPH (benign prostatic hyperplasia)   Malignant neoplasm of right upper lobe of lung (HCC)   Pressure injury of skin   Bacteremia: Probable   Acute lower UTI   Type 2 diabetes mellitus without complication (HCC)   Protein-calorie malnutrition, severe  #1 sepsis secondary to Proteus bacteremia and Proteus UTI. Patient with improving confusion daily.  Patient more alert and following commands appropriately.  Patient however not at baseline at this time.  On admission patient noted to have a fever of 104, confusion, tachycardia and tachypneic.  Patient blood cultures positive for Proteus and urine cultures positive for Proteus.  Fever curve is trending down.  Leukocytosis improving.  Continue IV Rocephin through today and transition to oral Vantin tomorrow 09/21/2018 and treat for total of 7 days.  Discussed with ID who is in agreement with antibiotic duration and plan.  2.  Acute toxic metabolic encephalopathy Likely secondary to problem #1.  Patient improving daily.  Patient more alert.  Patient tolerated dysphagia 3 diet.  Continue IV  antibiotics.  Follow.    3.  Hypertension Continue Norvasc.  Continue to hold Cozaar for now.  Follow.    4.  Depression/insomnia Continue to hold trazodone.  Follow.  5.  History of non-small cell lung cancer stage IIIb status post chemotherapy in 2016 Oncology has been informed of patient's admission via epic.  Outpatient follow-up.  6.  Recent diagnosis of pancreatic cancer status post XRT Admitting physician stated he sent an inbox message to Dr. Lisbeth Renshaw.  7.  Diabetes mellitus type 2 Hemoglobin A1c was 6.1 on 06/13/2018.  Diet to be advanced to a dysphagia 3 diet.  Patient tolerating diet.  Continue sliding scale insulin.   8.  Senile osteoporosis Continue to hold Prolia at this time.  9.  Anemia Likely dilutional.  Patient with no overt bleeding.  Hemoglobin stable at 9.5.  Anemia panel consistent with anemia of chronic disease.  Follow H&H.  10.  Hypokalemia/hypomagnesemia Replete.  Give magnesium 4 g IV x1.  Keep magnesium greater than 2.    DVT prophylaxis: Lovenox Code Status: DNR Family Communication: No family at bedside. Disposition Plan: Back to skilled nursing facility hopefully in the next 24 to 48 hours once patient is afebrile for at least 24 to 48 hours with clinical improvement.   Consultants:   None  Procedures:   CT head 09/17/2018  Chest x-ray 09/17/2018  Antimicrobials:  IV cefepime 09/17/2018 x 1 dose  IV Rocephin 09/18/2018>>>>> 09/20/2018  IV vancomycin 09/17/2018>>>> 09/18/2018  Oral Vantin 09/21/2018   Subjective: Patient laying in bed.  Patient was in the chair for an hour early on today.  Nursing student.  Patient more alert and following commands.  Denies any chest pain or shortness of breath.   Objective: Vitals:   09/20/18 0207 09/20/18 0540 09/20/18 1019 09/20/18 1100  BP: 121/66 (!) 137/98 122/69   Pulse: 87 94 96   Resp: 16 16 (!) 22   Temp: 97.7 F (36.5 C) (!) 97.5 F (36.4 C) (!) 97.5 F (36.4 C) 98.6 F (37 C)  TempSrc: Oral   Oral Rectal  SpO2: 94% 98% 97%   Weight:      Height:        Intake/Output Summary (Last 24 hours) at 09/20/2018 1132 Last data filed at 09/20/2018 1100 Gross per 24 hour  Intake 1774.91 ml  Output 1490 ml  Net 284.91 ml   Filed Weights   09/17/18 1140  Weight: 66.2 kg    Examination:  General exam: More alert today.  Following commands.  Dry mucous membranes. Respiratory system: Lungs clear to auscultation bilaterally.  No wheezes, no crackles, no rhonchi.  Normal respiratory effort.  Cardiovascular system: Regular rate rhythm no murmurs rubs or gallops.  No JVD.  No lower extremity edema. Gastrointestinal system: Abdomen is nontender, nondistended, soft, positive bowel sounds.  No rebound.  No guarding.  Central nervous system: Alert and oriented. No focal neurological deficits. Extremities: Symmetric 5 x 5 power. Skin: No rashes, lesions or ulcers Psychiatry: Judgement and insight appear poor to fair. Mood & affect appropriate.     Data Reviewed: I have personally reviewed following labs and imaging studies  CBC: Recent Labs  Lab 09/17/18 1149 09/18/18 0452 09/19/18 0412 09/20/18 0728  WBC 18.7* 16.9* 13.1* 10.3  NEUTROABS 16.4* 15.1* 10.9* 7.0  HGB 10.7* 10.4* 9.8* 9.5*  HCT 34.5* 33.3* 31.5* 30.0*  MCV 100.6* 98.8 99.1 96.8  PLT 468* 374 309 500   Basic Metabolic Panel: Recent Labs  Lab 09/17/18 1149 09/18/18 0452 09/19/18 0412 09/20/18 0728  NA 134* 136 136 136  K 3.5 3.9 3.0* 3.7  CL 97* 105 108 109  CO2 28 25 23 23   GLUCOSE 154* 149* 146* 175*  BUN 17 14 12 12   CREATININE 0.94 0.77 0.56* 0.47*  CALCIUM 11.6* 10.5* 10.1 9.7  MG  --   --  1.3* 1.6*   GFR: Estimated Creatinine Clearance: 58.5 mL/min (A) (by C-G formula based on SCr of 0.47 mg/dL (L)). Liver Function Tests: Recent Labs  Lab 09/17/18 1149 09/18/18 0452  AST 27 45*  ALT 18 20  ALKPHOS 86 68  BILITOT 0.8 0.9  PROT 7.2 5.8*  ALBUMIN 3.3* 2.5*   No results for input(s): LIPASE,  AMYLASE in the last 168 hours. No results for input(s): AMMONIA in the last 168 hours. Coagulation Profile: Recent Labs  Lab 09/17/18 1149 09/18/18 0452  INR 1.20 1.33   Cardiac Enzymes: No results for input(s): CKTOTAL, CKMB, CKMBINDEX, TROPONINI in the last 168 hours. BNP (last 3 results) No results for input(s): PROBNP in the last 8760 hours. HbA1C: No results for input(s): HGBA1C in the last 72 hours. CBG: Recent Labs  Lab 09/19/18 0816 09/19/18 1214 09/19/18 1640 09/19/18 2121 09/20/18 0725  GLUCAP 140* 196* 273* 235* 157*   Lipid Profile: No results for input(s): CHOL, HDL, LDLCALC, TRIG, CHOLHDL, LDLDIRECT in the last 72 hours. Thyroid Function Tests: No results for input(s): TSH, T4TOTAL, FREET4, T3FREE, THYROIDAB in the last 72 hours. Anemia Panel: Recent Labs    09/20/18 0728  VITAMINB12 654  FOLATE 20.4  FERRITIN 898*  TIBC 144*  IRON 56  RETICCTPCT 1.3   Sepsis Labs: Recent Labs  Lab 09/17/18 1125 09/17/18 1342 09/18/18 0452  PROCALCITON  --   --  0.39  LATICACIDVEN 2.1* 1.4  --     Recent Results (from the past 240 hour(s))  Culture, blood (Routine x 2)     Status: Abnormal   Collection Time: 09/17/18 11:28 AM  Result Value Ref Range Status   Specimen Description   Final    BLOOD BLOOD RIGHT FOREARM Performed at [facility] [address] [place], [place] [address]    Special Requests   Final    BOTTLES DRAWN AEROBIC AND ANAEROBIC Blood Culture adequate volume Performed at [facility] [address], [place], [place] [address]    Culture  Setup Time   Final    GRAM NEGATIVE RODS IN BOTH AEROBIC AND ANAEROBIC BOTTLES CRITICAL RESULT CALLED TO, READ BACK BY AND VERIFIED WITH: B. GREEN,PHARMD 0425 09/18/2018 Mena Goes Performed at [facility], [place] [address] [place], [place] [address]    Culture PROTEUS MIRABILIS (A)  Final   Report Status 09/20/2018 FINAL  Final   Organism ID, Bacteria  PROTEUS MIRABILIS  Final      Susceptibility   Proteus mirabilis - MIC*    AMPICILLIN <=2 SENSITIVE Sensitive     CEFAZOLIN <=4 SENSITIVE Sensitive     CEFEPIME <=1 SENSITIVE Sensitive     CEFTAZIDIME <=1 SENSITIVE Sensitive     CEFTRIAXONE <=1 SENSITIVE Sensitive     CIPROFLOXACIN <=0.25 SENSITIVE Sensitive     GENTAMICIN <=1 SENSITIVE Sensitive     IMIPENEM 2 SENSITIVE Sensitive     TRIMETH/SULFA <=20 SENSITIVE Sensitive     AMPICILLIN/SULBACTAM <=2 SENSITIVE Sensitive     PIP/TAZO <=4 SENSITIVE Sensitive     * PROTEUS MIRABILIS  Blood Culture ID Panel (Reflexed)     Status: Abnormal   Collection Time: 09/17/18 11:28 AM  Result Value Ref Range Status   Enterococcus species NOT DETECTED NOT DETECTED Final   Listeria monocytogenes NOT DETECTED NOT DETECTED Final   Staphylococcus species NOT DETECTED NOT DETECTED Final   Staphylococcus aureus (BCID) NOT DETECTED NOT DETECTED Final   Streptococcus species NOT DETECTED NOT DETECTED Final   Streptococcus agalactiae NOT DETECTED NOT DETECTED Final   Streptococcus pneumoniae NOT DETECTED NOT DETECTED Final   Streptococcus pyogenes NOT DETECTED NOT DETECTED Final   Acinetobacter baumannii NOT DETECTED NOT DETECTED Final   Enterobacteriaceae species DETECTED (A) NOT DETECTED Final    Comment: Enterobacteriaceae represent a large family of gram-negative bacteria, not a single organism. CRITICAL RESULT CALLED TO, READ BACK BY AND VERIFIED WITH: B. GREEN,PHARMD 0425 09/18/2018 T. TYSOR    Enterobacter cloacae complex NOT DETECTED NOT DETECTED Final   Escherichia coli NOT DETECTED NOT DETECTED Final   Klebsiella oxytoca NOT DETECTED NOT DETECTED Final   Klebsiella pneumoniae NOT DETECTED NOT DETECTED Final   Proteus species DETECTED (A) NOT DETECTED Final    Comment: CRITICAL RESULT CALLED TO, READ BACK BY AND VERIFIED WITH: B. GREEN,PHARMD 0425 09/18/2018 T. TYSOR    Serratia marcescens NOT DETECTED NOT DETECTED Final   Carbapenem  resistance NOT DETECTED NOT DETECTED Final   Haemophilus influenzae NOT DETECTED NOT DETECTED Final   Neisseria meningitidis NOT DETECTED NOT DETECTED Final   Pseudomonas aeruginosa NOT DETECTED NOT DETECTED Final   Candida albicans NOT DETECTED NOT DETECTED Final   Candida glabrata NOT DETECTED NOT DETECTED Final   Candida krusei NOT DETECTED NOT DETECTED Final  Candida parapsilosis NOT DETECTED NOT DETECTED Final   Candida tropicalis NOT DETECTED NOT DETECTED Final    Comment: Performed at [facility], [place] [address], [place], [place] [address]  Culture, blood (Routine x 2)     Status: Abnormal   Collection Time: 09/17/18 11:34 AM  Result Value Ref Range Status   Specimen Description   Final    BLOOD RIGHT HAND Performed at [facility] [address], [place], [place] [address]    Special Requests   Final    BOTTLES DRAWN AEROBIC AND ANAEROBIC Blood Culture results may not be optimal due to an inadequate volume of blood received in culture bottles Performed at [facility] [address] [place], [place] [address]    Culture  Setup Time   Final    GRAM NEGATIVE RODS IN BOTH AEROBIC AND ANAEROBIC BOTTLES CRITICAL VALUE NOTED.  VALUE IS CONSISTENT WITH PREVIOUSLY REPORTED AND CALLED VALUE.    Culture (A)  Final    PROTEUS MIRABILIS SUSCEPTIBILITIES PERFORMED ON PREVIOUS CULTURE WITHIN THE LAST 5 DAYS. Performed at [facility], [place] [address] [place], [place] [address]    Report Status 09/20/2018 FINAL  Final  Culture, Urine     Status: Abnormal (Preliminary result)   Collection Time: 09/17/18  6:40 PM  Result Value Ref Range Status   Specimen Description   Final    URINE, CATHETERIZED Performed at [facility], [place] [address] [place], [place] [address]    Special Requests   Final    Normal Performed at [facility], [place] [address], [place], [place] [address]    Culture >=100,000  COLONIES/mL PROTEUS MIRABILIS (A)  Final   Report Status PENDING  Incomplete  MRSA PCR Screening     Status: None   Collection Time: 09/17/18  9:27 PM  Result Value Ref Range Status   MRSA by PCR NEGATIVE NEGATIVE Final    Comment:        The GeneXpert MRSA Assay (FDA approved for NASAL specimens only), is one component of a comprehensive MRSA colonization surveillance program. It is not intended to diagnose MRSA infection nor to guide or monitor treatment for MRSA infections. Performed at [facility], [place] [address], [place], [place] [address]          Radiology Studies: No results found.      Scheduled Meds: . amLODipine  5 mg Oral QPM  . cromolyn  1 drop Both Eyes QID  . enoxaparin (LOVENOX) injection  40 mg Subcutaneous Q24H  . famotidine  20 mg Oral BID  . feeding supplement (ENSURE ENLIVE)  237 mL Oral TID BM  . insulin aspart  0-9 Units Subcutaneous TID WC  . pantoprazole  40 mg Oral Daily  . tamsulosin  0.4 mg Oral QHS   Continuous Infusions: . sodium chloride Stopped (09/17/18 1419)  . sodium chloride Stopped (09/20/18 0820)  . cefTRIAXone (ROCEPHIN)  IV 200 mL/hr at 09/20/18 0824  . magnesium sulfate 1 - 4 g bolus IVPB       LOS: 3 days    Time spent: 40 mins    Irine Seal, MD [facility]  If 7PM-7AM, please contact night-coverage [email or web address] 09/20/2018, 11:32 AM

## 2018-09-20 NOTE — Progress Notes (Signed)
Pt noted to have bright red blood streak in formed stool this morning. Dr. Grandville Silos paged and made aware. No external hemorrhoids noted on exam.

## 2018-09-20 NOTE — NC FL2 (Addendum)
[place] LEVEL OF CARE SCREENING TOOL     IDENTIFICATION  Patient Name: Sean Cooke Birthdate: Sep 06, 1926 Sex: male Admission Date (Current Location): 09/17/2018  [facility] and [place] Number:  Herbalist and Address:  [facility],  [place], [place]      Provider Number: [id number]  Attending Physician Name and Address:  Eugenie Filler, MD  Relative Name and Phone Number:       Current Level of Care: Hospital Recommended Level of Care: ALF Prior Approval Number:    Date Approved/Denied:   PASRR Number: [id number]   Discharge Plan: ALF   Current Diagnoses: Patient Active Problem List   Diagnosis Date Noted  . Protein-calorie malnutrition, severe 09/19/2018  . Pressure injury of skin 09/18/2018  . Bacteremia: Probable 09/18/2018  . Acute lower UTI   . Type 2 diabetes mellitus without complication ([place])   . Sepsis ([place]) 09/17/2018  . Cellulitis of scrotum   . Cellulitis 07/21/2018  . Coagulopathy ([place]) 06/21/2018  . Preoperative clearance 05/22/2018  . Primary adenocarcinoma of body of pancreas ([place]) 04/18/2018  . Diarrhea 10/29/2015  . Encounter for antineoplastic chemotherapy 07/31/2015  . Malignant neoplasm of right upper lobe of lung ([place]) 07/15/2015  . Hyponatremia 10/07/2014  . Community acquired bacterial pneumonia 10/04/2014  . Leukocytosis 10/04/2014  . Essential hypertension 10/04/2014  . BPH (benign prostatic hyperplasia) 10/04/2014  . Abdominal pain, LLQ (left lower quadrant) 04/03/2014  . Constipation 08/20/2013  . Lactose intolerance 08/20/2013  . GERD 04/27/2008  . THROMBOCYTHEMIA 04/23/2008  . Normocytic anemia 04/23/2008  . SPONDYLOSIS, LUMBAR 04/23/2008  . NEURITIS, LUMBOSACRAL 04/23/2008  . OSTEOPENIA 04/23/2008  . ASPLENIA 04/23/2008  . URINARY FREQUENCY 04/23/2008  . COLONIC POLYPS, ADENOMATOUS, HX OF 04/23/2008    Orientation RESPIRATION BLADDER Height & Weight      Self, Time, Place  Normal Indwelling catheter Weight: 145 lb 15.1 oz (66.2 kg) Height:  5\' 7"  (170.2 cm)(per wife)  BEHAVIORAL SYMPTOMS/MOOD NEUROLOGICAL BOWEL NUTRITION STATUS      Continent Diet(Dys. 3 DIET)  AMBULATORY STATUS COMMUNICATION OF NEEDS Skin   Extensive Assist Verbally (Stage II Sacrum )                       Personal Care Assistance Level of Assistance  Bathing, Feeding, Dressing Bathing Assistance: Limited assistance Feeding assistance: Limited assistance Dressing Assistance: Limited assistance     Functional Limitations Info  Sight, Hearing, Speech Sight Info: Adequate Hearing Info: Adequate Speech Info: Adequate    SPECIAL CARE FACTORS FREQUENCY  PT (By licensed PT), OT (By licensed OT)     PT Frequency: 5x/week OT Frequency: 5x/week            Contractures Contractures Info: Not present    Additional Factors Info  Code Status, Allergies, Insulin Sliding Scale Code Status Info: DNR Allergies Info: Allergies: Benzonatate, Lyrica Pregabalin, Gabapentin, Tizanidine, Penicillins, Tramadol   Insulin Sliding Scale Info: Yes, see medication list,        Current Medications (09/20/2018):  This is the current hospital active medication list Current Facility-Administered Medications  Medication Dose Route Frequency Provider Last Rate Last Dose  . 0.9 %  sodium chloride infusion   Intravenous PRN Nita Sells, MD   Stopped at 09/17/18 1419  . 0.9 %  sodium chloride infusion   Intravenous Continuous Nita Sells, MD   Stopped at 09/20/18 0820  . acetaminophen (TYLENOL) tablet 650 mg  650 mg Oral  Q6H PRN Nita Sells, MD   650 mg at 09/19/18 1638   Or  . acetaminophen (TYLENOL) suppository 650 mg  650 mg Rectal Q6H PRN Nita Sells, MD      . amLODipine (NORVASC) tablet 5 mg  5 mg Oral QPM Nita Sells, MD   5 mg at 09/19/18 1810  . antiseptic oral rinse (BIOTENE) solution 15 mL  15 mL Mouth Rinse 5 X Daily PRN  Nita Sells, MD      . cefTRIAXone (ROCEPHIN) 2 g in sodium chloride 0.9 % 100 mL IVPB  2 g Intravenous Q24H Dorrene German, RPH 200 mL/hr at 09/20/18 1194    . cromolyn (OPTICROM) 4 % ophthalmic solution 1 drop  1 drop Both Eyes QID Nita Sells, MD   1 drop at 09/20/18 0913  . enoxaparin (LOVENOX) injection 40 mg  40 mg Subcutaneous Q24H Nita Sells, MD   40 mg at 09/19/18 1945  . famotidine (PEPCID) tablet 20 mg  20 mg Oral BID Nita Sells, MD   20 mg at 09/20/18 1006  . feeding supplement (ENSURE ENLIVE) (ENSURE ENLIVE) liquid 237 mL  237 mL Oral TID BM Eugenie Filler, MD   237 mL at 09/20/18 1006  . ibuprofen (ADVIL,MOTRIN) tablet 400 mg  400 mg Oral Q6H PRN Nita Sells, MD   400 mg at 09/19/18 1814  . insulin aspart (novoLOG) injection 0-9 Units  0-9 Units Subcutaneous TID WC Eugenie Filler, MD   2 Units at 09/20/18 0813  . ketorolac (TORADOL) 30 MG/ML injection 30 mg  30 mg Intravenous Q6H PRN Nita Sells, MD   30 mg at 09/19/18 1941  . magnesium sulfate IVPB 4 g 100 mL  4 g Intravenous Once Eugenie Filler, MD      . metoprolol tartrate (LOPRESSOR) injection 2.5 mg  2.5 mg Intravenous Q6H PRN Nita Sells, MD   2.5 mg at 09/17/18 1608  . pantoprazole (PROTONIX) EC tablet 40 mg  40 mg Oral Daily Nita Sells, MD   40 mg at 09/20/18 1006  . polyethylene glycol (MIRALAX / GLYCOLAX) packet 17 g  17 g Oral Daily PRN Nita Sells, MD      . tamsulosin (FLOMAX) capsule 0.4 mg  0.4 mg Oral QHS Nita Sells, MD   0.4 mg at 09/19/18 2146     Discharge Medications: Please see discharge summary for a list of discharge medications.  Relevant Imaging Results:  Relevant Lab Results:   Additional Information SSN: [id number]  Lia Hopping, LCSW

## 2018-09-20 NOTE — Clinical Social Work Note (Addendum)
Clinical Social Work Assessment  Patient Details  Name: Sean Cooke MRN: [id number] Date of Birth: [birth year]  Date of referral:  09/20/18               Reason for consult:  Discharge Planning                Permission sought to share information with:  Case Manager, Facility Sport and exercise psychologist, Family Supports Permission granted to share information::  Yes, Verbal Permission Granted  Name::      Suzanne Garbers  Agency::  SNF's in [place]  Relationship::  Spouse   Contact Information:    [phone]  Housing/Transportation Living arrangements for the past 2 months:  [place] of Information:  Patient, Spouse Patient Interpreter Needed:  None Criminal Activity/Legal Involvement Pertinent to Current Situation/Hospitalization:  No - Comment as needed Significant Relationships:  Spouse Lives with:  Facility Resident Do you feel safe going back to the place where you live?  Yes Need for family participation in patient care:  Yes (Comment)  Care giving concerns:   -Patient admitted for fever and altered mental status.   Social Worker assessment / plan:  CSW discussed SNF placement with the patient spouse. Spouse reports the patient has been to SNF in the past. She understands the patient is weak and will need therapy to regain his strength. Per spouse, the patient uses a walker to ambulate. Patient needs assistance with bathing and dressing.  Patient currently requires dysphagia diet. Per nurse, the patient needs assistance with sitting up before eating.  CSW will follow up SNF offers later today when patient spouse arrives at the hospital.   [facility] completed.  PASRR completed.   Plan: SNF  Employment status:  Retired Forensic scientist:  Other (Comment Required)(HTA) PT Recommendations:  [place] / Referral to community resources:  [place]  Patient/Family's Response to care:  Agreeable and  Responding well to care.   Patient/Family's Understanding of and Emotional Response to Diagnosis, Current Treatment, and Prognosis:  Patient understands he is in the hospital because he is sick. Patient spouse understands the patient diagnosis will need to short rehab before returning to ALF.   Emotional Assessment Appearance:  Appears stated age Attitude/Demeanor/Rapport:    Affect (typically observed):  Accepting Orientation:  Oriented to Self, Oriented to Place, Oriented to  Time, Oriented to Situation Alcohol / Substance use:  Not Applicable Psych involvement (Current and /or in the community):  No (Comment)  Discharge Needs  Concerns to be addressed:  Decision making concerns Readmission within the last 30 days:  No Current discharge risk:  Dependent with Mobility Barriers to Discharge:  Continued Medical Work up, Programmer, applications ([place])   Lia Hopping, LCSW 09/20/2018, 11:06 AM

## 2018-09-20 NOTE — Care Management Important Message (Signed)
Important Message  Patient Details  Name: VIRAT PRATHER MRN: [id number] Date of Birth: 08/20/1926   Medicare Important Message Given:  Yes    Kerin Salen 09/20/2018, 11:15 AMImportant Message  Patient Details  Name: JAHMAL DUNAVANT MRN: [id number] Date of Birth: 1927/07/05   Medicare Important Message Given:  Yes    Kerin Salen 09/20/2018, 11:15 AM

## 2018-09-21 LAB — CBC WITH DIFFERENTIAL/PLATELET
Abs Immature Granulocytes: 0.2 10*3/uL — ABNORMAL HIGH (ref 0.00–0.07)
Basophils Absolute: 0 10*3/uL (ref 0.0–0.1)
Basophils Relative: 0 %
Eosinophils Absolute: 0.5 10*3/uL (ref 0.0–0.5)
Eosinophils Relative: 4 %
HCT: 27.6 % — ABNORMAL LOW (ref 39.0–52.0)
Hemoglobin: 8.6 g/dL — ABNORMAL LOW (ref 13.0–17.0)
IMMATURE GRANULOCYTES: 2 %
LYMPHS ABS: 1.7 10*3/uL (ref 0.7–4.0)
Lymphocytes Relative: 14 %
MCH: 30.8 pg (ref 26.0–34.0)
MCHC: 31.2 g/dL (ref 30.0–36.0)
MCV: 98.9 fL (ref 80.0–100.0)
Monocytes Absolute: 1.2 10*3/uL — ABNORMAL HIGH (ref 0.1–1.0)
Monocytes Relative: 10 %
NRBC: 0 % (ref 0.0–0.2)
Neutro Abs: 8.3 10*3/uL — ABNORMAL HIGH (ref 1.7–7.7)
Neutrophils Relative %: 70 %
Platelets: 365 10*3/uL (ref 150–400)
RBC: 2.79 MIL/uL — ABNORMAL LOW (ref 4.22–5.81)
RDW: 15.9 % — ABNORMAL HIGH (ref 11.5–15.5)
WBC: 11.9 10*3/uL — ABNORMAL HIGH (ref 4.0–10.5)

## 2018-09-21 LAB — BASIC METABOLIC PANEL
Anion gap: 5 (ref 5–15)
BUN: 11 mg/dL (ref 8–23)
CO2: 24 mmol/L (ref 22–32)
Calcium: 9.5 mg/dL (ref 8.9–10.3)
Chloride: 108 mmol/L (ref 98–111)
Creatinine, Ser: 0.42 mg/dL — ABNORMAL LOW (ref 0.61–1.24)
GFR calc non Af Amer: >60 mL/min (ref >60)
GLUCOSE: 209 mg/dL — AB (ref 70–99)
Potassium: 3.9 mmol/L (ref 3.5–5.1)
Sodium: 137 mmol/L (ref 135–145)

## 2018-09-21 LAB — GLUCOSE, CAPILLARY
Glucose-Capillary: 171 mg/dL — ABNORMAL HIGH (ref 70–99)
Glucose-Capillary: 118 mg/dL — ABNORMAL HIGH (ref 70–99)
Glucose-Capillary: 228 mg/dL — ABNORMAL HIGH (ref 70–99)

## 2018-09-21 LAB — MAGNESIUM: Magnesium: 1.7 mg/dL (ref 1.7–2.4)

## 2018-09-21 MED ORDER — LOSARTAN POTASSIUM 25 MG PO TABS
25.0000 mg | ORAL_TABLET | Freq: Every day | ORAL | Status: DC
  Administered 2018-09-21 – 2018-09-27 (×7): 25 mg via ORAL
  Filled 2018-09-21 (×7): qty 1

## 2018-09-21 MED ORDER — MAGNESIUM OXIDE 400 (241.3 MG) MG PO TABS
400.0000 mg | ORAL_TABLET | Freq: Two times a day (BID) | ORAL | Status: DC
  Administered 2018-09-21 – 2018-09-27 (×13): 400 mg via ORAL
  Filled 2018-09-21 (×13): qty 1

## 2018-09-21 MED ORDER — MAGNESIUM SULFATE 4 GM/100ML IV SOLN
4.0000 g | Freq: Once | INTRAVENOUS | Status: AC
  Administered 2018-09-21: 4 g via INTRAVENOUS
  Filled 2018-09-21: qty 100

## 2018-09-21 NOTE — Progress Notes (Signed)
PROGRESS NOTE    Sean Cooke  HUT:[id number] DOB: [birth year] DOA: 09/17/2018 PCP: [facility]    Brief Narrative:  Patient is a 85 year old resident of [facility] usually alert oriented, prior history of adenocarcinoma stage III of the lung, newly diagnosed stage I T2N0 pancreatic cancer status post XRT 08/19/2018, supposed to return to oncology clinic 09/19/2018, history of BPH, diastolic heart failure, recent hospitalization 07/21/2018 for scrotal cellulitis and need of indwelling Foley catheter who presented to the ED with confusion, fever 104 and Foley noted to have frank pus around it which was changed at the facility.  On admission patient noted to be septic and sepsis work-up underway.  Patient placed empirically on IV antibiotics.   Assessment & Plan:   Principal Problem:   Sepsis ([place]) Active Problems:   BPH (benign prostatic hyperplasia)   Malignant neoplasm of right upper lobe of lung (HCC)   Pressure injury of skin   Bacteremia: Probable   Acute lower UTI   Type 2 diabetes mellitus without complication (HCC)   Protein-calorie malnutrition, severe  #1 sepsis secondary to Proteus bacteremia and Proteus UTI. Patient improving clinically daily.  Confusion improved.  Patient more alert following commands appropriately.  Wife at baseline feels patient is improving.  Patient however not at baseline at this time.  On admission patient noted to have a fever of 104, confusion, tachycardia and tachypneic.  Patient blood cultures positive for Proteus and urine cultures positive for Proteus.  Fever curve is trending down.  Leukocytosis improving.  IV Rocephin will be changed to oral Vantin today and treat for total of 7 days.  Discussed with ID who is in agreement with antibiotic duration and plan.  2.  Acute toxic metabolic encephalopathy Likely secondary to problem #1.  Patient improving daily.  Patient more alert.  Patient tolerated dysphagia 3  diet.  Transition from IV antibiotics to oral antibiotics. Follow.    3.  Hypertension Blood pressure stable.  Continue Norvasc.  Resume Cozaar 25 mg daily. Follow.    4.  Depression/insomnia Continue to hold trazodone.  Follow.  5.  History of non-small cell lung cancer stage IIIb status post chemotherapy in 2016 Oncology has been informed of patient's admission via epic.  Outpatient follow-up.  6.  Recent diagnosis of pancreatic cancer status post XRT Admitting physician stated he sent an inbox message to Dr. Lisbeth Renshaw.  Outpatient follow-up.  7.  Diabetes mellitus type 2 Hemoglobin A1c was 6.1 on 06/13/2018.  Tolerating dysphagia 3 diet.  Patient Continue sliding scale insulin.   8.  Senile osteoporosis Continue to hold Prolia at this time.  Could likely resume once antibiotic course has been completed.  9.  Anemia Likely dilutional.  Patient with no overt bleeding.  Hemoglobin stable at 8.6.  Anemia panel consistent with anemia of chronic disease.  Saline lock IV fluids.  Follow H&H.  10.  Hypokalemia/hypomagnesemia Repleted.  Magnesium at 1.7.  Give magnesium 4 g IV x1.  Keep magnesium greater than 2.    DVT prophylaxis: Lovenox Code Status: DNR Family Communication: No family at bedside. Disposition Plan: Back to skilled nursing facility hopefully in the next 24 to 48 hours once patient is afebrile for at least 24 to 48 hours with clinical improvement.   Consultants:   None  Procedures:   CT head 09/17/2018  Chest x-ray 09/17/2018  Antimicrobials:  IV cefepime 09/17/2018 x 1 dose  IV Rocephin 09/18/2018>>>>> 09/20/2018  IV vancomycin 09/17/2018>>>> 09/18/2018  Oral Vantin 09/21/2018   Subjective: Patient alert.  Denies any chest pain.  Denies any shortness of breath.  No abdominal pain.  Feeling better.  Wife at bedside.   Objective: Vitals:   09/21/18 0326 09/21/18 0614 09/21/18 1144 09/21/18 1350  BP: 135/81 (!) 151/92 130/76 126/64  Pulse: 95 (!) 108 91 85  Resp:  18 18 14 16   Temp: 98.1 F (36.7 C) 98.3 F (36.8 C) 98.2 F (36.8 C) 97.8 F (36.6 C)  TempSrc: Oral Oral Oral Axillary  SpO2: 96% 97% 97% 93%  Weight:      Height:        Intake/Output Summary (Last 24 hours) at 09/21/2018 1654 Last data filed at 09/21/2018 1542 Gross per 24 hour  Intake 1445.55 ml  Output 2025 ml  Net -579.45 ml   Filed Weights   09/17/18 1140  Weight: 66.2 kg    Examination:  General exam: Alert.  Following commands.  Respiratory system: CTAB.  No wheezes, no crackles, no rhonchi.  Normal respiratory effort.  Cardiovascular system: RRR no murmurs rubs or gallops.  No JVD.  No lower extremity edema. Gastrointestinal system: Abdomen is soft, nontender, nondistended, positive bowel sounds.  No rebound.  No guarding.  Central nervous system: Alert and oriented. No focal neurological deficits. Extremities: Symmetric 5 x 5 power. Skin: No rashes, lesions or ulcers Psychiatry: Judgement and insight appear poor to fair. Mood & affect appropriate.     Data Reviewed: I have personally reviewed following labs and imaging studies  CBC: Recent Labs  Lab 09/17/18 1149 09/18/18 0452 09/19/18 0412 09/20/18 0728 09/21/18 0238  WBC 18.7* 16.9* 13.1* 10.3 11.9*  NEUTROABS 16.4* 15.1* 10.9* 7.0 8.3*  HGB 10.7* 10.4* 9.8* 9.5* 8.6*  HCT 34.5* 33.3* 31.5* 30.0* 27.6*  MCV 100.6* 98.8 99.1 96.8 98.9  PLT 468* 374 309 328 017   Basic Metabolic Panel: Recent Labs  Lab 09/17/18 1149 09/18/18 0452 09/19/18 0412 09/20/18 0728 09/21/18 0238  NA 134* 136 136 136 137  K 3.5 3.9 3.0* 3.7 3.9  CL 97* 105 108 109 108  CO2 28 25 23 23 24   GLUCOSE 154* 149* 146* 175* 209*  BUN 17 14 12 12 11   CREATININE 0.94 0.77 0.56* 0.47* 0.42*  CALCIUM 11.6* 10.5* 10.1 9.7 9.5  MG  --   --  1.3* 1.6* 1.7   GFR: Estimated Creatinine Clearance: 58.5 mL/min (A) (by C-G formula based on SCr of 0.42 mg/dL (L)). Liver Function Tests: Recent Labs  Lab 09/17/18 1149  09/18/18 0452  AST 27 45*  ALT 18 20  ALKPHOS 86 68  BILITOT 0.8 0.9  PROT 7.2 5.8*  ALBUMIN 3.3* 2.5*   No results for input(s): LIPASE, AMYLASE in the last 168 hours. No results for input(s): AMMONIA in the last 168 hours. Coagulation Profile: Recent Labs  Lab 09/17/18 1149 09/18/18 0452  INR 1.20 1.33   Cardiac Enzymes: No results for input(s): CKTOTAL, CKMB, CKMBINDEX, TROPONINI in the last 168 hours. BNP (last 3 results) No results for input(s): PROBNP in the last 8760 hours. HbA1C: No results for input(s): HGBA1C in the last 72 hours. CBG: Recent Labs  Lab 09/19/18 2121 09/20/18 0725 09/20/18 1139 09/20/18 1649 09/21/18 0838  GLUCAP 235* 157* 248* 295* 171*   Lipid Profile: No results for input(s): CHOL, HDL, LDLCALC, TRIG, CHOLHDL, LDLDIRECT in the last 72 hours. Thyroid Function Tests: No results for input(s): TSH, T4TOTAL, FREET4, T3FREE, THYROIDAB in the last 72 hours. Anemia Panel: Recent  Labs    09/20/18 0728  VITAMINB12 654  FOLATE 20.4  FERRITIN 898*  TIBC 144*  IRON 56  RETICCTPCT 1.3   Sepsis Labs: Recent Labs  Lab 09/17/18 1125 09/17/18 1342 09/18/18 0452  PROCALCITON  --   --  0.39  LATICACIDVEN 2.1* 1.4  --     Recent Results (from the past 240 hour(s))  Culture, blood (Routine x 2)     Status: Abnormal   Collection Time: 09/17/18 11:28 AM  Result Value Ref Range Status   Specimen Description   Final    BLOOD BLOOD RIGHT FOREARM Performed at [facility] [address], [place], [place] [address]    Special Requests   Final    BOTTLES DRAWN AEROBIC AND ANAEROBIC Blood Culture adequate volume Performed at [facility] [address] [place], [place] [address]    Culture  Setup Time   Final    GRAM NEGATIVE RODS IN BOTH AEROBIC AND ANAEROBIC BOTTLES CRITICAL RESULT CALLED TO, READ BACK BY AND VERIFIED WITH: B. GREEN,PHARMD 0425 09/18/2018 Mena Goes Performed at [facility],  [place] [address] [place] [address], [place], [place] [address]    Culture PROTEUS MIRABILIS (A)  Final   Report Status 09/20/2018 FINAL  Final   Organism ID, Bacteria PROTEUS MIRABILIS  Final      Susceptibility   Proteus mirabilis - MIC*    AMPICILLIN <=2 SENSITIVE Sensitive     CEFAZOLIN <=4 SENSITIVE Sensitive     CEFEPIME <=1 SENSITIVE Sensitive     CEFTAZIDIME <=1 SENSITIVE Sensitive     CEFTRIAXONE <=1 SENSITIVE Sensitive     CIPROFLOXACIN <=0.25 SENSITIVE Sensitive     GENTAMICIN <=1 SENSITIVE Sensitive     IMIPENEM 2 SENSITIVE Sensitive     TRIMETH/SULFA <=20 SENSITIVE Sensitive     AMPICILLIN/SULBACTAM <=2 SENSITIVE Sensitive     PIP/TAZO <=4 SENSITIVE Sensitive     * PROTEUS MIRABILIS  Blood Culture ID Panel (Reflexed)     Status: Abnormal   Collection Time: 09/17/18 11:28 AM  Result Value Ref Range Status   Enterococcus species NOT DETECTED NOT DETECTED Final   Listeria monocytogenes NOT DETECTED NOT DETECTED Final   Staphylococcus species NOT DETECTED NOT DETECTED Final   Staphylococcus aureus (BCID) NOT DETECTED NOT DETECTED Final   Streptococcus species NOT DETECTED NOT DETECTED Final   Streptococcus agalactiae NOT DETECTED NOT DETECTED Final   Streptococcus pneumoniae NOT DETECTED NOT DETECTED Final   Streptococcus pyogenes NOT DETECTED NOT DETECTED Final   Acinetobacter baumannii NOT DETECTED NOT DETECTED Final   Enterobacteriaceae species DETECTED (A) NOT DETECTED Final    Comment: Enterobacteriaceae represent a large family of gram-negative bacteria, not a single organism. CRITICAL RESULT CALLED TO, READ BACK BY AND VERIFIED WITH: B. GREEN,PHARMD 0425 09/18/2018 T. TYSOR    Enterobacter cloacae complex NOT DETECTED NOT DETECTED Final   Escherichia coli NOT DETECTED NOT DETECTED Final   Klebsiella oxytoca NOT DETECTED NOT DETECTED Final   Klebsiella pneumoniae NOT DETECTED NOT DETECTED Final   Proteus species DETECTED (A) NOT DETECTED Final    Comment: CRITICAL RESULT CALLED TO, READ  BACK BY AND VERIFIED WITH: B. GREEN,PHARMD 0425 09/18/2018 T. TYSOR    Serratia marcescens NOT DETECTED NOT DETECTED Final   Carbapenem resistance NOT DETECTED NOT DETECTED Final   Haemophilus influenzae NOT DETECTED NOT DETECTED Final   Neisseria meningitidis NOT DETECTED NOT DETECTED Final   Pseudomonas aeruginosa NOT DETECTED NOT DETECTED Final   Candida albicans NOT  DETECTED NOT DETECTED Final   Candida glabrata NOT DETECTED NOT DETECTED Final   Candida krusei NOT DETECTED NOT DETECTED Final   Candida parapsilosis NOT DETECTED NOT DETECTED Final   Candida tropicalis NOT DETECTED NOT DETECTED Final    Comment: Performed at [facility], [place] [address] [place], [place] [address]  Culture, blood (Routine x 2)     Status: Abnormal   Collection Time: 09/17/18 11:34 AM  Result Value Ref Range Status   Specimen Description   Final    BLOOD RIGHT HAND Performed at [facility] [address] [place], [place] [address]    Special Requests   Final    BOTTLES DRAWN AEROBIC AND ANAEROBIC Blood Culture results may not be optimal due to an inadequate volume of blood received in culture bottles Performed at [facility] [address], [place], [place] [address]    Culture  Setup Time   Final    GRAM NEGATIVE RODS IN BOTH AEROBIC AND ANAEROBIC BOTTLES CRITICAL VALUE NOTED.  VALUE IS CONSISTENT WITH PREVIOUSLY REPORTED AND CALLED VALUE.    Culture (A)  Final    PROTEUS MIRABILIS SUSCEPTIBILITIES PERFORMED ON PREVIOUS CULTURE WITHIN THE LAST 5 DAYS. Performed at [facility], [place] [address], [place], [place] [address]    Report Status 09/20/2018 FINAL  Final  Culture, Urine     Status: Abnormal   Collection Time: 09/17/18  6:40 PM  Result Value Ref Range Status   Specimen Description   Final    URINE, CATHETERIZED Performed at [facility] [address] [place], [place] [address]    Special Requests   Final     Normal Performed at [facility], [place] [address] [place], [place] [address]    Culture >=100,000 COLONIES/mL PROTEUS MIRABILIS (A)  Final   Report Status 09/20/2018 FINAL  Final   Organism ID, Bacteria PROTEUS MIRABILIS (A)  Final      Susceptibility   Proteus mirabilis - MIC*    AMPICILLIN <=2 SENSITIVE Sensitive     CEFAZOLIN <=4 SENSITIVE Sensitive     CEFTRIAXONE <=1 SENSITIVE Sensitive     CIPROFLOXACIN <=0.25 SENSITIVE Sensitive     GENTAMICIN <=1 SENSITIVE Sensitive     IMIPENEM 1 SENSITIVE Sensitive     NITROFURANTOIN 128 RESISTANT Resistant     TRIMETH/SULFA <=20 SENSITIVE Sensitive     AMPICILLIN/SULBACTAM <=2 SENSITIVE Sensitive     PIP/TAZO <=4 SENSITIVE Sensitive     * >=100,000 COLONIES/mL PROTEUS MIRABILIS  MRSA PCR Screening     Status: None   Collection Time: 09/17/18  9:27 PM  Result Value Ref Range Status   MRSA by PCR NEGATIVE NEGATIVE Final    Comment:        The GeneXpert MRSA Assay (FDA approved for NASAL specimens only), is one component of a comprehensive MRSA colonization surveillance program. It is not intended to diagnose MRSA infection nor to guide or monitor treatment for MRSA infections. Performed at [facility], [place] [address] [place], [place] [address]          Radiology Studies: No results found.      Scheduled Meds: . amLODipine  5 mg Oral QPM  . cefpodoxime  400 mg Oral Q12H  . cromolyn  1 drop Both Eyes QID  . enoxaparin (LOVENOX) injection  40 mg Subcutaneous Q24H  . famotidine  20 mg Oral BID  . feeding supplement (ENSURE ENLIVE)  237 mL Oral TID BM  . insulin aspart  0-9 Units Subcutaneous TID WC  . losartan  25 mg Oral Daily  . magnesium oxide  400 mg Oral BID  . pantoprazole  40 mg Oral Daily  . tamsulosin  0.4 mg Oral QHS   Continuous Infusions: . sodium chloride Stopped (09/17/18 1419)     LOS: 4 days    Time spent: 40 mins    Irine Seal, MD [facility]  If 7PM-7AM, please contact night-coverage [email or web address] 09/21/2018, 4:54 PM

## 2018-09-22 LAB — CBC
HCT: 30.3 % — ABNORMAL LOW (ref 39.0–52.0)
Hemoglobin: 9.2 g/dL — ABNORMAL LOW (ref 13.0–17.0)
MCH: 31.4 pg (ref 26.0–34.0)
MCHC: 30.4 g/dL (ref 30.0–36.0)
MCV: 103.4 fL — ABNORMAL HIGH (ref 80.0–100.0)
NRBC: 0 % (ref 0.0–0.2)
Platelets: 382 10*3/uL (ref 150–400)
RBC: 2.93 MIL/uL — ABNORMAL LOW (ref 4.22–5.81)
RDW: 16.1 % — ABNORMAL HIGH (ref 11.5–15.5)
WBC: 10.9 10*3/uL — ABNORMAL HIGH (ref 4.0–10.5)

## 2018-09-22 LAB — BASIC METABOLIC PANEL
Anion gap: 7 (ref 5–15)
BUN: 9 mg/dL (ref 8–23)
CHLORIDE: 102 mmol/L (ref 98–111)
CO2: 25 mmol/L (ref 22–32)
Calcium: 9.9 mg/dL (ref 8.9–10.3)
Creatinine, Ser: 0.41 mg/dL — ABNORMAL LOW (ref 0.61–1.24)
GFR calc Af Amer: >60 mL/min (ref >60)
GFR calc non Af Amer: >60 mL/min (ref >60)
Glucose, Bld: 180 mg/dL — ABNORMAL HIGH (ref 70–99)
Potassium: 4.1 mmol/L (ref 3.5–5.1)
Sodium: 134 mmol/L — ABNORMAL LOW (ref 135–145)

## 2018-09-22 LAB — GLUCOSE, CAPILLARY
Glucose-Capillary: 209 mg/dL — ABNORMAL HIGH (ref 70–99)
Glucose-Capillary: 115 mg/dL — ABNORMAL HIGH (ref 70–99)
Glucose-Capillary: 167 mg/dL — ABNORMAL HIGH (ref 70–99)

## 2018-09-22 LAB — MAGNESIUM: Magnesium: 1.8 mg/dL (ref 1.7–2.4)

## 2018-09-22 MED ORDER — MAGNESIUM SULFATE 4 GM/100ML IV SOLN
4.0000 g | Freq: Once | INTRAVENOUS | Status: AC
  Administered 2018-09-22: 4 g via INTRAVENOUS
  Filled 2018-09-22: qty 100

## 2018-09-22 NOTE — Progress Notes (Signed)
PROGRESS NOTE    Sean Cooke  JJK:[id number] DOB: 23-Oct-1926 DOA: 09/17/2018 PCP: [facility]    Brief Narrative:  Patient is a 85 year old resident of [facility] usually alert oriented, prior history of adenocarcinoma stage III of the lung, newly diagnosed stage I T2N0 pancreatic cancer status post XRT 08/19/2018, supposed to return to oncology clinic 09/19/2018, history of BPH, diastolic heart failure, recent hospitalization 07/21/2018 for scrotal cellulitis and need of indwelling Foley catheter who presented to the ED with confusion, fever 104 and Foley noted to have frank pus around it which was changed at the facility.  On admission patient noted to be septic and sepsis work-up underway.  Patient placed empirically on IV antibiotics.   Assessment & Plan:   Principal Problem:   Sepsis ([place]) Active Problems:   BPH (benign prostatic hyperplasia)   Malignant neoplasm of right upper lobe of lung (HCC)   Pressure injury of skin   Bacteremia: Probable   Acute lower UTI   Type 2 diabetes mellitus without complication (HCC)   Protein-calorie malnutrition, severe  #1 sepsis secondary to Proteus bacteremia and Proteus UTI. Patient improving clinically daily.  Confusion improved.  Patient more alert following commands appropriately.  Wife feels patient is improving.  Patient however not at baseline at this time.  On admission patient noted to have a fever of 104, confusion, tachycardia and tachypneic.  Patient blood cultures positive for Proteus and urine cultures positive for Proteus.  Fever curve is trending down.  Leukocytosis improving.  IV Rocephin has been transitioned to oral Vantin to complete a 7-day course of antibiotic treatment. Discussed with ID who is in agreement with antibiotic duration and plan.  2.  Acute toxic metabolic encephalopathy Likely secondary to problem #1.  Patient improving daily.  Patient more alert and following some  commands.  Patient tolerated dysphagia 3 diet.  IV antibiotics have been transitioned to oral antibiotics to complete course of antibiotic treatment.  Follow.    3.  Hypertension Blood pressure stable.  Continue Cozaar and Norvasc. Follow.    4.  Depression/insomnia Continue to hold trazodone.  Follow.  5.  History of non-small cell lung cancer stage IIIb status post chemotherapy in 2016 Oncology has been informed of patient's admission via epic.  Outpatient follow-up.  6.  Recent diagnosis of pancreatic cancer status post XRT Admitting physician stated he sent an inbox message to Dr. Lisbeth Renshaw.  Outpatient follow-up.  7.  Diabetes mellitus type 2 Hemoglobin A1c was 6.1 on 06/13/2018.  Tolerating dysphagia 3 diet.  Patient Continue sliding scale insulin.   8.  Senile osteoporosis Continue to hold Prolia at this time.  Could likely resume once antibiotic course has been completed.  9.  Anemia Likely dilutional.  Patient with no overt bleeding.  Hemoglobin stable at 9.2.  Anemia panel consistent with anemia of chronic disease.  Saline lock IV fluids.  Follow H&H.  10.  Hypokalemia/hypomagnesemia Repleted.  Magnesium at 1.8.  Give magnesium 4 g IV x1.  Keep magnesium greater than 2.  Continue oral magnesium supplementation.  11.  Disposition Patient was awaiting skilled nursing facility placement as recommended per PT.  Insurance authorization was denied.  Had a peer to peer discussion with MD from insurance company who stated that patient was recently discharged from the hospital went to a skilled nursing facility and was just discharged back to assisted living facility.  It is felt per MD of insurance company that patient is now custodial  care as he was stated that assisted living facility felt they could not manage patient at this time.  It was felt that patient's medical issues were more secondary to his cancer which he was receiving radiation treatment and was felt patient would likely not  improve significantly.  Will have social work aid in patient's disposition to nursing facility.    DVT prophylaxis: Lovenox Code Status: DNR Family Communication: Updated wife at bedside. Disposition Plan: Nursing facility when bed available.  Skilled nursing facility has been denied by insurance.    Consultants:   None  Procedures:   CT head 09/17/2018  Chest x-ray 09/17/2018  Antimicrobials:  IV cefepime 09/17/2018 x 1 dose  IV Rocephin 09/18/2018>>>>> 09/20/2018  IV vancomycin 09/17/2018>>>> 09/18/2018  Oral Vantin 09/21/2018   Subjective: Patient alert and following commands.  Denies any chest pain.  Denies any shortness of breath.  Denies any abdominal pain.  Feeling better.  Wife at bedside.  Objective: Vitals:   09/22/18 0032 09/22/18 0433 09/22/18 0718 09/22/18 1113  BP: 134/77 138/81  119/70  Pulse: 90 92  96  Resp: (!) 30 (!) 26 20 14   Temp: (!) 97.5 F (36.4 C) 97.9 F (36.6 C)  97.8 F (36.6 C)  TempSrc: Oral Oral  Oral  SpO2: 95% 97%  97%  Weight:      Height:        Intake/Output Summary (Last 24 hours) at 09/22/2018 1132 Last data filed at 09/22/2018 0900 Gross per 24 hour  Intake 550 ml  Output 1910 ml  Net -1360 ml   Filed Weights   09/17/18 1140  Weight: 66.2 kg    Examination:  General exam: Alert.  Following commands.  Respiratory system: Lungs clear to auscultation bilaterally.  No wheezes, no crackles, no rhonchi.  Normal respiratory effort.  Cardiovascular system: Regular rate rhythm no murmurs rubs or gallops.  No JVD.  No lower extremity edema. Gastrointestinal system: Abdomen is nontender, nondistended, soft, positive bowel sounds.  No rebound.  No guarding.  Central nervous system: Alert and oriented. No focal neurological deficits. Extremities: Symmetric 5 x 5 power. Skin: No rashes, lesions or ulcers Psychiatry: Judgement and insight appear poor to fair. Mood & affect appropriate.     Data Reviewed: I have personally reviewed  following labs and imaging studies  CBC: Recent Labs  Lab 09/17/18 1149 09/18/18 0452 09/19/18 0412 09/20/18 0728 09/21/18 0238 09/22/18 0254  WBC 18.7* 16.9* 13.1* 10.3 11.9* 10.9*  NEUTROABS 16.4* 15.1* 10.9* 7.0 8.3*  --   HGB 10.7* 10.4* 9.8* 9.5* 8.6* 9.2*  HCT 34.5* 33.3* 31.5* 30.0* 27.6* 30.3*  MCV 100.6* 98.8 99.1 96.8 98.9 103.4*  PLT 468* 374 309 328 365 702   Basic Metabolic Panel: Recent Labs  Lab 09/18/18 0452 09/19/18 0412 09/20/18 0728 09/21/18 0238 09/22/18 0254  NA 136 136 136 137 134*  K 3.9 3.0* 3.7 3.9 4.1  CL 105 108 109 108 102  CO2 25 23 23 24 25   GLUCOSE 149* 146* 175* 209* 180*  BUN 14 12 12 11 9   CREATININE 0.77 0.56* 0.47* 0.42* 0.41*  CALCIUM 10.5* 10.1 9.7 9.5 9.9  MG  --  1.3* 1.6* 1.7 1.8   GFR: Estimated Creatinine Clearance: 58.5 mL/min (A) (by C-G formula based on SCr of 0.41 mg/dL (L)). Liver Function Tests: Recent Labs  Lab 09/17/18 1149 09/18/18 0452  AST 27 45*  ALT 18 20  ALKPHOS 86 68  BILITOT 0.8 0.9  PROT 7.2 5.8*  ALBUMIN 3.3* 2.5*   No results for input(s): LIPASE, AMYLASE in the last 168 hours. No results for input(s): AMMONIA in the last 168 hours. Coagulation Profile: Recent Labs  Lab 09/17/18 1149 09/18/18 0452  INR 1.20 1.33   Cardiac Enzymes: No results for input(s): CKTOTAL, CKMB, CKMBINDEX, TROPONINI in the last 168 hours. BNP (last 3 results) No results for input(s): PROBNP in the last 8760 hours. HbA1C: No results for input(s): HGBA1C in the last 72 hours. CBG: Recent Labs  Lab 09/20/18 1139 09/20/18 1649 09/21/18 0838 09/21/18 1739 09/21/18 2158  GLUCAP 248* 295* 171* 118* 228*   Lipid Profile: No results for input(s): CHOL, HDL, LDLCALC, TRIG, CHOLHDL, LDLDIRECT in the last 72 hours. Thyroid Function Tests: No results for input(s): TSH, T4TOTAL, FREET4, T3FREE, THYROIDAB in the last 72 hours. Anemia Panel: Recent Labs    09/20/18 0728  VITAMINB12 654  FOLATE 20.4  FERRITIN  898*  TIBC 144*  IRON 56  RETICCTPCT 1.3   Sepsis Labs: Recent Labs  Lab 09/17/18 1125 09/17/18 1342 09/18/18 0452  PROCALCITON  --   --  0.39  LATICACIDVEN 2.1* 1.4  --     Recent Results (from the past 240 hour(s))  Culture, blood (Routine x 2)     Status: Abnormal   Collection Time: 09/17/18 11:28 AM  Result Value Ref Range Status   Specimen Description   Final    BLOOD BLOOD RIGHT FOREARM Performed at [facility] [address], [place], [place] [address]    Special Requests   Final    BOTTLES DRAWN AEROBIC AND ANAEROBIC Blood Culture adequate volume Performed at [facility] [address] [place], [place] [address]    Culture  Setup Time   Final    GRAM NEGATIVE RODS IN BOTH AEROBIC AND ANAEROBIC BOTTLES CRITICAL RESULT CALLED TO, READ BACK BY AND VERIFIED WITH: B. GREEN,PHARMD 0425 09/18/2018 Mena Goes Performed at [facility], [place] [address], [place], [place] [address]    Culture PROTEUS MIRABILIS (A)  Final   Report Status 09/20/2018 FINAL  Final   Organism ID, Bacteria PROTEUS MIRABILIS  Final      Susceptibility   Proteus mirabilis - MIC*    AMPICILLIN <=2 SENSITIVE Sensitive     CEFAZOLIN <=4 SENSITIVE Sensitive     CEFEPIME <=1 SENSITIVE Sensitive     CEFTAZIDIME <=1 SENSITIVE Sensitive     CEFTRIAXONE <=1 SENSITIVE Sensitive     CIPROFLOXACIN <=0.25 SENSITIVE Sensitive     GENTAMICIN <=1 SENSITIVE Sensitive     IMIPENEM 2 SENSITIVE Sensitive     TRIMETH/SULFA <=20 SENSITIVE Sensitive     AMPICILLIN/SULBACTAM <=2 SENSITIVE Sensitive     PIP/TAZO <=4 SENSITIVE Sensitive     * PROTEUS MIRABILIS  Blood Culture ID Panel (Reflexed)     Status: Abnormal   Collection Time: 09/17/18 11:28 AM  Result Value Ref Range Status   Enterococcus species NOT DETECTED NOT DETECTED Final   Listeria monocytogenes NOT DETECTED NOT DETECTED Final   Staphylococcus species NOT DETECTED NOT DETECTED Final   Staphylococcus  aureus (BCID) NOT DETECTED NOT DETECTED Final   Streptococcus species NOT DETECTED NOT DETECTED Final   Streptococcus agalactiae NOT DETECTED NOT DETECTED Final   Streptococcus pneumoniae NOT DETECTED NOT DETECTED Final   Streptococcus pyogenes NOT DETECTED NOT DETECTED Final   Acinetobacter baumannii NOT DETECTED NOT DETECTED Final   Enterobacteriaceae species DETECTED (A) NOT DETECTED Final    Comment: Enterobacteriaceae represent a  large family of gram-negative bacteria, not a single organism. CRITICAL RESULT CALLED TO, READ BACK BY AND VERIFIED WITH: B. GREEN,PHARMD 0425 09/18/2018 T. TYSOR    Enterobacter cloacae complex NOT DETECTED NOT DETECTED Final   Escherichia coli NOT DETECTED NOT DETECTED Final   Klebsiella oxytoca NOT DETECTED NOT DETECTED Final   Klebsiella pneumoniae NOT DETECTED NOT DETECTED Final   Proteus species DETECTED (A) NOT DETECTED Final    Comment: CRITICAL RESULT CALLED TO, READ BACK BY AND VERIFIED WITH: B. GREEN,PHARMD 0425 09/18/2018 T. TYSOR    Serratia marcescens NOT DETECTED NOT DETECTED Final   Carbapenem resistance NOT DETECTED NOT DETECTED Final   Haemophilus influenzae NOT DETECTED NOT DETECTED Final   Neisseria meningitidis NOT DETECTED NOT DETECTED Final   Pseudomonas aeruginosa NOT DETECTED NOT DETECTED Final   Candida albicans NOT DETECTED NOT DETECTED Final   Candida glabrata NOT DETECTED NOT DETECTED Final   Candida krusei NOT DETECTED NOT DETECTED Final   Candida parapsilosis NOT DETECTED NOT DETECTED Final   Candida tropicalis NOT DETECTED NOT DETECTED Final    Comment: Performed at [facility], [place] [address], [place], [place] [address]  Culture, blood (Routine x 2)     Status: Abnormal   Collection Time: 09/17/18 11:34 AM  Result Value Ref Range Status   Specimen Description   Final    BLOOD RIGHT HAND Performed at [facility] [address] [place], [place] [address]    Special Requests   Final     BOTTLES DRAWN AEROBIC AND ANAEROBIC Blood Culture results may not be optimal due to an inadequate volume of blood received in culture bottles Performed at [facility] [address], [place], [place] [address]    Culture  Setup Time   Final    GRAM NEGATIVE RODS IN BOTH AEROBIC AND ANAEROBIC BOTTLES CRITICAL VALUE NOTED.  VALUE IS CONSISTENT WITH PREVIOUSLY REPORTED AND CALLED VALUE.    Culture (A)  Final    PROTEUS MIRABILIS SUSCEPTIBILITIES PERFORMED ON PREVIOUS CULTURE WITHIN THE LAST 5 DAYS. Performed at [facility], [place] [address] [place], [place] [address]    Report Status 09/20/2018 FINAL  Final  Culture, Urine     Status: Abnormal   Collection Time: 09/17/18  6:40 PM  Result Value Ref Range Status   Specimen Description   Final    URINE, CATHETERIZED Performed at [facility] [address], [place], [place] [address]    Special Requests   Final    Normal Performed at [facility], [place] [address] [place], [place] [address]    Culture >=100,000 COLONIES/mL PROTEUS MIRABILIS (A)  Final   Report Status 09/20/2018 FINAL  Final   Organism ID, Bacteria PROTEUS MIRABILIS (A)  Final      Susceptibility   Proteus mirabilis - MIC*    AMPICILLIN <=2 SENSITIVE Sensitive     CEFAZOLIN <=4 SENSITIVE Sensitive     CEFTRIAXONE <=1 SENSITIVE Sensitive     CIPROFLOXACIN <=0.25 SENSITIVE Sensitive     GENTAMICIN <=1 SENSITIVE Sensitive     IMIPENEM 1 SENSITIVE Sensitive     NITROFURANTOIN 128 RESISTANT Resistant     TRIMETH/SULFA <=20 SENSITIVE Sensitive     AMPICILLIN/SULBACTAM <=2 SENSITIVE Sensitive     PIP/TAZO <=4 SENSITIVE Sensitive     * >=100,000 COLONIES/mL PROTEUS MIRABILIS  MRSA PCR Screening     Status: None   Collection Time: 09/17/18  9:27 PM  Result  Value Ref Range Status   MRSA by PCR NEGATIVE NEGATIVE Final    Comment:        The GeneXpert MRSA Assay (FDA approved for NASAL specimens only), is  one component of a comprehensive MRSA colonization surveillance program. It is not intended to diagnose MRSA infection nor to guide or monitor treatment for MRSA infections. Performed at [facility], [place] [address], [place], [place] [address]          Radiology Studies: No results found.      Scheduled Meds: . amLODipine  5 mg Oral QPM  . cefpodoxime  400 mg Oral Q12H  . cromolyn  1 drop Both Eyes QID  . enoxaparin (LOVENOX) injection  40 mg Subcutaneous Q24H  . famotidine  20 mg Oral BID  . feeding supplement (ENSURE ENLIVE)  237 mL Oral TID BM  . insulin aspart  0-9 Units Subcutaneous TID WC  . losartan  25 mg Oral Daily  . magnesium oxide  400 mg Oral BID  . pantoprazole  40 mg Oral Daily  . tamsulosin  0.4 mg Oral QHS   Continuous Infusions:    LOS: 5 days    Time spent: 40 mins    Irine Seal, MD [facility]  If 7PM-7AM, please contact night-coverage [email or web address] 09/22/2018, 11:32 AM

## 2018-09-23 LAB — GLUCOSE, CAPILLARY
GLUCOSE-CAPILLARY: 196 mg/dL — AB (ref 70–99)
Glucose-Capillary: 284 mg/dL — ABNORMAL HIGH (ref 70–99)
Glucose-Capillary: 169 mg/dL — ABNORMAL HIGH (ref 70–99)
Glucose-Capillary: 123 mg/dL — ABNORMAL HIGH (ref 70–99)

## 2018-09-23 LAB — CBC
HCT: 29 % — ABNORMAL LOW (ref 39.0–52.0)
Hemoglobin: 9.1 g/dL — ABNORMAL LOW (ref 13.0–17.0)
MCH: 31 pg (ref 26.0–34.0)
MCHC: 31.4 g/dL (ref 30.0–36.0)
MCV: 98.6 fL (ref 80.0–100.0)
Platelets: 475 10*3/uL — ABNORMAL HIGH (ref 150–400)
RBC: 2.94 MIL/uL — AB (ref 4.22–5.81)
RDW: 15.9 % — ABNORMAL HIGH (ref 11.5–15.5)
WBC: 11.8 10*3/uL — ABNORMAL HIGH (ref 4.0–10.5)
nRBC: 0 % (ref 0.0–0.2)

## 2018-09-23 LAB — BASIC METABOLIC PANEL
Anion gap: 7 (ref 5–15)
BUN: 9 mg/dL (ref 8–23)
CHLORIDE: 101 mmol/L (ref 98–111)
CO2: 25 mmol/L (ref 22–32)
Calcium: 10.2 mg/dL (ref 8.9–10.3)
Creatinine, Ser: 0.49 mg/dL — ABNORMAL LOW (ref 0.61–1.24)
GFR calc Af Amer: >60 mL/min (ref >60)
GFR calc non Af Amer: >60 mL/min (ref >60)
Glucose, Bld: 203 mg/dL — ABNORMAL HIGH (ref 70–99)
POTASSIUM: 4 mmol/L (ref 3.5–5.1)
Sodium: 133 mmol/L — ABNORMAL LOW (ref 135–145)

## 2018-09-23 LAB — MAGNESIUM: Magnesium: 1.5 mg/dL — ABNORMAL LOW (ref 1.7–2.4)

## 2018-09-23 MED ORDER — MAGNESIUM SULFATE 4 GM/100ML IV SOLN
4.0000 g | Freq: Once | INTRAVENOUS | Status: AC
  Administered 2018-09-23: 4 g via INTRAVENOUS
  Filled 2018-09-23: qty 100

## 2018-09-23 NOTE — Progress Notes (Signed)
Assumed care of this pt from previous nurse. Agree with previous nurses assessment. Will continue to monitor.

## 2018-09-23 NOTE — Care Management Important Message (Signed)
Important Message  Patient Details  Name: Sean Cooke MRN: [id number] Date of Birth: 1927/04/29   Medicare Important Message Given:  Yes    Kerin Salen 09/23/2018, 11:37 AMImportant Message  Patient Details  Name: Sean Cooke MRN: [id number] Date of Birth: 03/08/1927   Medicare Important Message Given:  Yes    Kerin Salen 09/23/2018, 11:37 AM

## 2018-09-23 NOTE — Progress Notes (Signed)
Followed up with pt and wife about disposition since insurance denial of SNF for rehab.  Wife reports that she would like to pursue SNF as private pay rehab patient. Feels that it is possible he will need long term care but at this point not planning to admit as residential patient. Reached out to SNFs at her request again to find out private pay bed availability for pt.  Sharren Bridge, MSW, LCSW Clinical Social Work 09/23/2018 [phone]

## 2018-09-23 NOTE — Progress Notes (Signed)
PROGRESS NOTE    Sean Cooke  TMH:[id number] DOB: Aug 21, 1926 DOA: 09/17/2018 PCP: Sean Cooke    Brief Narrative:  Patient is a 85 year old resident of [facility] usually alert oriented, prior history of adenocarcinoma stage III of the lung, newly diagnosed stage I T2N0 pancreatic cancer status post XRT 08/19/2018, supposed to return to oncology clinic 09/19/2018, history of BPH, diastolic heart failure, recent hospitalization 07/21/2018 for scrotal cellulitis and need of indwelling Foley catheter who presented to the ED with confusion, fever 104 and Foley noted to have frank pus around it which was changed at the facility.  On admission patient noted to be septic and sepsis work-up underway.  Patient placed empirically on IV antibiotics.   Assessment & Plan:   Principal Problem:   Sepsis ([place]) Active Problems:   BPH (benign prostatic hyperplasia)   Malignant neoplasm of right upper lobe of lung (HCC)   Pressure injury of skin   Bacteremia: Probable   Acute lower UTI   Type 2 diabetes mellitus without complication (HCC)   Protein-calorie malnutrition, severe  #1 sepsis secondary to Proteus bacteremia and Proteus UTI. Patient presented confused.  Clinically improving daily.  More alert.  Following commands appropriately. On admission patient noted to have a fever of 104, confusion, tachycardia and tachypneic.  Patient blood cultures positive for Proteus and urine cultures positive for Proteus.  Fever curve is trended down.  Leukocytosis improved.  IV Rocephin has been transitioned to oral Vantin to complete a 7-day course of antibiotic treatment. Discussed with ID who is in agreement with antibiotic duration and plan.  2.  Acute toxic metabolic encephalopathy Likely secondary to problem #1.  Patient improving daily.  Patient more alert and following some commands.  Patient tolerated dysphagia 3 diet.  IV antibiotics have been transitioned to oral  antibiotics to complete course of antibiotic treatment.  Follow.    3.  Hypertension Blood pressure stable.  Continue Cozaar and Norvasc. Follow.    4.  Depression/insomnia Continue to hold trazodone.  Follow.  5.  History of non-small cell lung cancer stage IIIb status post chemotherapy in 2016 Oncology has been informed of patient's admission via epic.  Outpatient follow-up.  6.  Recent diagnosis of pancreatic cancer status post XRT Admitting physician stated he sent an inbox message to Dr. Lisbeth Renshaw.  Outpatient follow-up.  7.  Diabetes mellitus type 2 Hemoglobin A1c was 6.1 on 06/13/2018.  Tolerating dysphagia 3 diet.  Patient Continue sliding scale insulin.   8.  Senile osteoporosis Continue to hold Prolia at this time.  Could likely resume once antibiotic course has been completed.  9.  Anemia Likely dilutional.  Patient with no overt bleeding.  Hemoglobin stable at 9.1.  Anemia panel consistent with anemia of chronic disease.  Saline lock IV fluids.  Follow H&H.  10.  Hypokalemia/hypomagnesemia Repleted.  Magnesium at 1.5.  Give magnesium 4 g IV x1.  Keep magnesium greater than 2.  Continue oral magnesium supplementation.  11.  Disposition Patient was awaiting skilled nursing facility placement as recommended per PT.  Insurance authorization was denied.  Had a peer to peer discussion with MD from insurance company who stated that patient was recently discharged from the hospital went to a skilled nursing facility and was just discharged back to assisted living facility.  It is felt per MD of insurance company that patient is now custodial care as he was stated that assisted living facility felt they could not manage patient at  this time.  It was felt that patient's medical issues were more secondary to his cancer which he was receiving radiation treatment and was felt patient would likely not improve significantly.  Social worker working to aid in patient's disposition to nursing  facility.    DVT prophylaxis: Lovenox Code Status: DNR Family Communication: Updated wife at bedside. Disposition Plan: Nursing facility when bed available.  Skilled nursing facility has been denied by insurance.    Consultants:   None  Procedures:   CT head 09/17/2018  Chest x-ray 09/17/2018  Antimicrobials:  IV cefepime 09/17/2018 x 1 dose  IV Rocephin 09/18/2018>>>>> 09/20/2018  IV vancomycin 09/17/2018>>>> 09/18/2018  Oral Vantin 09/21/2018   Subjective: Patient laying in bed.  Alert.  Denies chest pain or shortness of breath.  No abdominal pain.  Wife at bedside.    Objective: Vitals:   09/22/18 1759 09/22/18 2036 09/23/18 0136 09/23/18 0430  BP: 123/75 136/75 134/79 123/75  Pulse: 96 95 97 90  Resp: 14 18 16 18   Temp: 98 F (36.7 C) 98.1 F (36.7 C) 97.8 F (36.6 C) 98.4 F (36.9 C)  TempSrc:  Oral Oral Oral  SpO2: 96% 93% 94% 92%  Weight:      Height:        Intake/Output Summary (Last 24 hours) at 09/23/2018 1240 Last data filed at 09/23/2018 0600 Gross per 24 hour  Intake 480 ml  Output 1300 ml  Net -820 ml   Filed Weights   09/17/18 1140  Weight: 66.2 kg    Examination:  General exam: NAD Respiratory system: CTAB anterior lung fields.  No wheezes, no crackles, no rhonchi.  Normal respiratory effort.  Cardiovascular system: Regular rate rhythm no murmurs rubs or gallops.  No JVD.  No lower extremity edema. Gastrointestinal system: Abdomen is soft, nontender, nondistended, positive bowel sounds.  No rebound.  No guarding. Central nervous system: Alert and oriented. No focal neurological deficits. Extremities: Symmetric 5 x 5 power. Skin: No rashes, lesions or ulcers Psychiatry: Judgement and insight appear poor to fair. Mood & affect appropriate.     Data Reviewed: I have personally reviewed following labs and imaging studies  CBC: Recent Labs  Lab 09/17/18 1149 09/18/18 0452 09/19/18 0412 09/20/18 0728 09/21/18 0238 09/22/18 0254  09/23/18 0511  WBC 18.7* 16.9* 13.1* 10.3 11.9* 10.9* 11.8*  NEUTROABS 16.4* 15.1* 10.9* 7.0 8.3*  --   --   HGB 10.7* 10.4* 9.8* 9.5* 8.6* 9.2* 9.1*  HCT 34.5* 33.3* 31.5* 30.0* 27.6* 30.3* 29.0*  MCV 100.6* 98.8 99.1 96.8 98.9 103.4* 98.6  PLT 468* 374 309 328 365 382 343*   Basic Metabolic Panel: Recent Labs  Lab 09/19/18 0412 09/20/18 0728 09/21/18 0238 09/22/18 0254 09/23/18 0511  NA 136 136 137 134* 133*  K 3.0* 3.7 3.9 4.1 4.0  CL 108 109 108 102 101  CO2 23 23 24 25 25   GLUCOSE 146* 175* 209* 180* 203*  BUN 12 12 11 9 9   CREATININE 0.56* 0.47* 0.42* 0.41* 0.49*  CALCIUM 10.1 9.7 9.5 9.9 10.2  MG 1.3* 1.6* 1.7 1.8 1.5*   GFR: Estimated Creatinine Clearance: 58.5 mL/min (A) (by C-G formula based on SCr of 0.49 mg/dL (L)). Liver Function Tests: Recent Labs  Lab 09/17/18 1149 09/18/18 0452  AST 27 45*  ALT 18 20  ALKPHOS 86 68  BILITOT 0.8 0.9  PROT 7.2 5.8*  ALBUMIN 3.3* 2.5*   No results for input(s): LIPASE, AMYLASE in the last 168 hours. No results  for input(s): AMMONIA in the last 168 hours. Coagulation Profile: Recent Labs  Lab 09/17/18 1149 09/18/18 0452  INR 1.20 1.33   Cardiac Enzymes: No results for input(s): CKTOTAL, CKMB, CKMBINDEX, TROPONINI in the last 168 hours. BNP (last 3 results) No results for input(s): PROBNP in the last 8760 hours. HbA1C: No results for input(s): HGBA1C in the last 72 hours. CBG: Recent Labs  Lab 09/22/18 1251 09/22/18 1634 09/22/18 2038 09/23/18 0756 09/23/18 1232  GLUCAP 209* 115* 167* 196* 284*   Lipid Profile: No results for input(s): CHOL, HDL, LDLCALC, TRIG, CHOLHDL, LDLDIRECT in the last 72 hours. Thyroid Function Tests: No results for input(s): TSH, T4TOTAL, FREET4, T3FREE, THYROIDAB in the last 72 hours. Anemia Panel: No results for input(s): VITAMINB12, FOLATE, FERRITIN, TIBC, IRON, RETICCTPCT in the last 72 hours. Sepsis Labs: Recent Labs  Lab 09/17/18 1125 09/17/18 1342 09/18/18 0452   PROCALCITON  --   --  0.39  LATICACIDVEN 2.1* 1.4  --     Recent Results (from the past 240 hour(s))  Culture, blood (Routine x 2)     Status: Abnormal   Collection Time: 09/17/18 11:28 AM  Result Value Ref Range Status   Specimen Description   Final    BLOOD BLOOD RIGHT FOREARM Performed at [facility] [address] [place], [place] [address]    Special Requests   Final    BOTTLES DRAWN AEROBIC AND ANAEROBIC Blood Culture adequate volume Performed at [facility] [address], [place], [place] [address]    Culture  Setup Time   Final    GRAM NEGATIVE RODS IN BOTH AEROBIC AND ANAEROBIC BOTTLES CRITICAL RESULT CALLED TO, READ BACK BY AND VERIFIED WITH: B. GREEN,PHARMD 0425 09/18/2018 Mena Goes Performed at [facility], [place] [address] [place], [place] [address]    Culture PROTEUS MIRABILIS (A)  Final   Report Status 09/20/2018 FINAL  Final   Organism ID, Bacteria PROTEUS MIRABILIS  Final      Susceptibility   Proteus mirabilis - MIC*    AMPICILLIN <=2 SENSITIVE Sensitive     CEFAZOLIN <=4 SENSITIVE Sensitive     CEFEPIME <=1 SENSITIVE Sensitive     CEFTAZIDIME <=1 SENSITIVE Sensitive     CEFTRIAXONE <=1 SENSITIVE Sensitive     CIPROFLOXACIN <=0.25 SENSITIVE Sensitive     GENTAMICIN <=1 SENSITIVE Sensitive     IMIPENEM 2 SENSITIVE Sensitive     TRIMETH/SULFA <=20 SENSITIVE Sensitive     AMPICILLIN/SULBACTAM <=2 SENSITIVE Sensitive     PIP/TAZO <=4 SENSITIVE Sensitive     * PROTEUS MIRABILIS  Blood Culture ID Panel (Reflexed)     Status: Abnormal   Collection Time: 09/17/18 11:28 AM  Result Value Ref Range Status   Enterococcus species NOT DETECTED NOT DETECTED Final   Listeria monocytogenes NOT DETECTED NOT DETECTED Final   Staphylococcus species NOT DETECTED NOT DETECTED Final   Staphylococcus aureus (BCID) NOT DETECTED NOT DETECTED Final   Streptococcus species NOT DETECTED NOT DETECTED Final   Streptococcus  agalactiae NOT DETECTED NOT DETECTED Final   Streptococcus pneumoniae NOT DETECTED NOT DETECTED Final   Streptococcus pyogenes NOT DETECTED NOT DETECTED Final   Acinetobacter baumannii NOT DETECTED NOT DETECTED Final   Enterobacteriaceae species DETECTED (A) NOT DETECTED Final    Comment: Enterobacteriaceae represent a large family of gram-negative bacteria, not a single organism. CRITICAL RESULT CALLED TO, READ BACK BY AND VERIFIED WITH: B. GREEN,PHARMD 0425 09/18/2018 T. TYSOR    Enterobacter cloacae  complex NOT DETECTED NOT DETECTED Final   Escherichia coli NOT DETECTED NOT DETECTED Final   Klebsiella oxytoca NOT DETECTED NOT DETECTED Final   Klebsiella pneumoniae NOT DETECTED NOT DETECTED Final   Proteus species DETECTED (A) NOT DETECTED Final    Comment: CRITICAL RESULT CALLED TO, READ BACK BY AND VERIFIED WITH: B. GREEN,PHARMD 0425 09/18/2018 T. TYSOR    Serratia marcescens NOT DETECTED NOT DETECTED Final   Carbapenem resistance NOT DETECTED NOT DETECTED Final   Haemophilus influenzae NOT DETECTED NOT DETECTED Final   Neisseria meningitidis NOT DETECTED NOT DETECTED Final   Pseudomonas aeruginosa NOT DETECTED NOT DETECTED Final   Candida albicans NOT DETECTED NOT DETECTED Final   Candida glabrata NOT DETECTED NOT DETECTED Final   Candida krusei NOT DETECTED NOT DETECTED Final   Candida parapsilosis NOT DETECTED NOT DETECTED Final   Candida tropicalis NOT DETECTED NOT DETECTED Final    Comment: Performed at [facility], [place] [address], [place], [place] [address]  Culture, blood (Routine x 2)     Status: Abnormal   Collection Time: 09/17/18 11:34 AM  Result Value Ref Range Status   Specimen Description   Final    BLOOD RIGHT HAND Performed at [facility] [address] [place], [place] [address]    Special Requests   Final    BOTTLES DRAWN AEROBIC AND ANAEROBIC Blood Culture results may not be optimal due to an inadequate volume of blood received  in culture bottles Performed at [facility] [address], [place], [place] [address]    Culture  Setup Time   Final    GRAM NEGATIVE RODS IN BOTH AEROBIC AND ANAEROBIC BOTTLES CRITICAL VALUE NOTED.  VALUE IS CONSISTENT WITH PREVIOUSLY REPORTED AND CALLED VALUE.    Culture (A)  Final    PROTEUS MIRABILIS SUSCEPTIBILITIES PERFORMED ON PREVIOUS CULTURE WITHIN THE LAST 5 DAYS. Performed at [facility], [place] [address] [place], [place] [address]    Report Status 09/20/2018 FINAL  Final  Culture, Urine     Status: Abnormal   Collection Time: 09/17/18  6:40 PM  Result Value Ref Range Status   Specimen Description   Final    URINE, CATHETERIZED Performed at [facility] [address] [place], [place] [address]    Special Requests   Final    Normal Performed at [facility], [place] [address] [place],  [address]    Culture >=100,000 COLONIES/mL PROTEUS MIRABILIS (A)  Final   Report Status 09/20/2018 FINAL  Final   Organism ID, Bacteria PROTEUS MIRABILIS (A)  Final      Susceptibility   Proteus mirabilis - MIC*    AMPICILLIN <=2 SENSITIVE Sensitive     CEFAZOLIN <=4 SENSITIVE Sensitive     CEFTRIAXONE <=1 SENSITIVE Sensitive     CIPROFLOXACIN <=0.25 SENSITIVE Sensitive     GENTAMICIN <=1 SENSITIVE Sensitive     IMIPENEM 1 SENSITIVE Sensitive     NITROFURANTOIN 128 RESISTANT Resistant     TRIMETH/SULFA <=20 SENSITIVE Sensitive     AMPICILLIN/SULBACTAM <=2 SENSITIVE Sensitive     PIP/TAZO <=4 SENSITIVE Sensitive     * >=100,000 COLONIES/mL PROTEUS MIRABILIS  MRSA PCR Screening     Status: None   Collection Time: 09/17/18  9:27 PM  Result Value Ref Range Status   MRSA by PCR NEGATIVE NEGATIVE Final    Comment:        The GeneXpert MRSA Assay (FDA approved for  NASAL specimens only), is one component of a comprehensive MRSA colonization surveillance program. It is not intended to diagnose MRSA infection  nor to guide or monitor treatment for MRSA infections. Performed at [facility], [place] [address] [place], [place] [address]          Radiology Studies: No results found.      Scheduled Meds: . amLODipine  5 mg Oral QPM  . cefpodoxime  400 mg Oral Q12H  . cromolyn  1 drop Both Eyes QID  . enoxaparin (LOVENOX) injection  40 mg Subcutaneous Q24H  . famotidine  20 mg Oral BID  . feeding supplement (ENSURE ENLIVE)  237 mL Oral TID BM  . insulin aspart  0-9 Units Subcutaneous TID WC  . losartan  25 mg Oral Daily  . magnesium oxide  400 mg Oral BID  . pantoprazole  40 mg Oral Daily  . tamsulosin  0.4 mg Oral QHS   Continuous Infusions: . magnesium sulfate 1 - 4 g bolus IVPB 4 g (09/23/18 1129)     LOS: 6 days    Time spent: 35 mins    Irine Seal, MD [facility]  If 7PM-7AM, please contact night-coverage [email or web address] 09/23/2018, 12:40 PM

## 2018-09-23 NOTE — Plan of Care (Signed)
  Problem: Clinical Measurements: Goal: Ability to maintain clinical measurements within normal limits will improve Outcome: Progressing Goal: Will remain free from infection Outcome: Progressing Goal: Diagnostic test results will improve Outcome: Progressing Goal: Respiratory complications will improve Outcome: Progressing Goal: Cardiovascular complication will be avoided Outcome: Progressing   Problem: Fluid Volume: Goal: Hemodynamic stability will improve Outcome: Progressing   Problem: Clinical Measurements: Goal: Diagnostic test results will improve Outcome: Progressing Goal: Signs and symptoms of infection will decrease Outcome: Progressing

## 2018-09-23 NOTE — Progress Notes (Signed)
Physical Therapy Treatment Patient Details Name: Sean Cooke MRN: [id number] DOB: 02-02-1927 Today's Date: 09/23/2018    History of Present Illness 85 yo male admitted with sepsis, acute encephalopathy. Hx of lung ca, pancreatic ca, DDD, BPH    PT Comments    Pt reports, "No, I can't" multiple times with attempts to mobilize.  Pt unable to provide further explanation however then reports LE pain.  NT reports pt had been up with him to the [facility] earlier today and pt was assisting, however pt resistant to movement and assist to even sit EOB at this time.  Continue to recommend SNF upon d/c.   Follow Up Recommendations  SNF     Equipment Recommendations  None recommended by PT    Recommendations for Other Services       Precautions / Restrictions Precautions Precautions: Fall    Mobility  Bed Mobility Overal bed mobility: Needs Assistance Bed Mobility: Supine to Sit           General bed mobility comments: attempted to assist pt however he refused with 3 attempts, NT even attempted to encourage however pt started reporting pain in bil LEs limiting his ability and requested to remain supine and sleep  Transfers                    Ambulation/Gait                 Stairs             Wheelchair Mobility    Modified Rankin (Stroke Patients Only)       Balance                                            Cognition Arousal/Alertness: Awake/alert   Overall Cognitive Status: No family/caregiver present to determine baseline cognitive functioning Area of Impairment: Orientation;Memory;Problem solving                 Orientation Level: Disoriented to;Place;Time;Situation   Memory: Decreased short-term memory       Problem Solving: Requires verbal cues;Requires tactile cues;Decreased initiation General Comments: pt appears confused, declined participating, resisted assistance, no family/friends present       Exercises General Exercises - Lower Extremity Heel Slides: AAROM;10 reps;Both Hip ABduction/ADduction: AAROM;10 reps;Both    General Comments        Pertinent Vitals/Pain Pain Assessment: Faces Faces Pain Scale: Hurts little more Pain Location: R thigh then states both legs Pain Descriptors / Indicators: Sore Pain Intervention(s): Repositioned;Monitored during session    Home Living                      Prior Function            PT Goals (current goals can now be found in the care plan section) Progress towards PT goals: Progressing toward goals    Frequency    Min 2X/week      PT Plan Current plan remains appropriate    Co-evaluation              AM-PAC PT "6 Clicks" Mobility   Outcome Measure  Help needed turning from your back to your side while in a flat bed without using bedrails?: A Little Help needed moving from lying on your back to sitting on the side of a flat bed without using bedrails?:  A Little Help needed moving to and from a bed to a chair (including a wheelchair)?: A Little Help needed standing up from a chair using your arms (e.g., wheelchair or bedside chair)?: A Little Help needed to walk in hospital room?: A Little Help needed climbing 3-5 steps with a railing? : A Lot 6 Click Score: 17    End of Session   Activity Tolerance: Other (comment)(pt self limiting) Patient left: in bed;with call bell/phone within reach;with bed alarm set   PT Visit Diagnosis: Muscle weakness (generalized) (M62.81);Difficulty in walking, not elsewhere classified (R26.2)     Time: 9449-6759 PT Time Calculation (min) (ACUTE ONLY): 17 min  Charges:  $Therapeutic Activity: 8-22 mins                     Carmelia Bake, PT, DPT Acute Rehabilitation Services Office: [phone] Pager: [phone]  Trena Platt 09/23/2018, 4:05 PM

## 2018-09-24 DIAGNOSIS — R509 Fever, unspecified: Secondary | ICD-10-CM

## 2018-09-24 DIAGNOSIS — N390 Urinary tract infection, site not specified: Secondary | ICD-10-CM

## 2018-09-24 DIAGNOSIS — B964 Proteus (mirabilis) (morganii) as the cause of diseases classified elsewhere: Secondary | ICD-10-CM

## 2018-09-24 DIAGNOSIS — A498 Other bacterial infections of unspecified site: Secondary | ICD-10-CM

## 2018-09-24 LAB — BASIC METABOLIC PANEL
Anion gap: 8 (ref 5–15)
BUN: 12 mg/dL (ref 8–23)
CO2: 26 mmol/L (ref 22–32)
Calcium: 10.5 mg/dL — ABNORMAL HIGH (ref 8.9–10.3)
Chloride: 98 mmol/L (ref 98–111)
Creatinine, Ser: 0.6 mg/dL — ABNORMAL LOW (ref 0.61–1.24)
GFR calc Af Amer: >60 mL/min (ref >60)
GFR calc non Af Amer: >60 mL/min (ref >60)
Glucose, Bld: 169 mg/dL — ABNORMAL HIGH (ref 70–99)
POTASSIUM: 4.3 mmol/L (ref 3.5–5.1)
Sodium: 132 mmol/L — ABNORMAL LOW (ref 135–145)

## 2018-09-24 LAB — GLUCOSE, CAPILLARY
GLUCOSE-CAPILLARY: 206 mg/dL — AB (ref 70–99)
GLUCOSE-CAPILLARY: 309 mg/dL — AB (ref 70–99)
Glucose-Capillary: 183 mg/dL — ABNORMAL HIGH (ref 70–99)
Glucose-Capillary: 290 mg/dL — ABNORMAL HIGH (ref 70–99)

## 2018-09-24 LAB — MAGNESIUM: Magnesium: 1.5 mg/dL — ABNORMAL LOW (ref 1.7–2.4)

## 2018-09-24 MED ORDER — CEFPODOXIME PROXETIL 200 MG PO TABS: 400.0000 mg | ORAL_TABLET | Freq: Two times a day (BID) | ORAL | 0 refills | Status: DC

## 2018-09-24 MED ORDER — IBUPROFEN 400 MG PO TABS: 400.0000 mg | ORAL_TABLET | Freq: Four times a day (QID) | ORAL | Status: DC | PRN

## 2018-09-24 MED ORDER — LOSARTAN POTASSIUM 25 MG PO TABS: 100.0000 mg | ORAL_TABLET | Freq: Every morning | ORAL | 0 refills | Status: DC

## 2018-09-24 MED ORDER — MAGNESIUM OXIDE 400 (241.3 MG) MG PO TABS: 400.0000 mg | ORAL_TABLET | Freq: Two times a day (BID) | ORAL | 0 refills | Status: DC

## 2018-09-24 MED ORDER — MAGNESIUM SULFATE 4 GM/100ML IV SOLN
4.0000 g | Freq: Once | INTRAVENOUS | Status: AC
  Administered 2018-09-24: 4 g via INTRAVENOUS
  Filled 2018-09-24: qty 100

## 2018-09-24 NOTE — Progress Notes (Signed)
Assumed care of pt from previous nurse. Agree with previous nurses assessment. Will continue to monitor.

## 2018-09-24 NOTE — Consult Note (Signed)
   [facility] Inpatient Consult   09/24/2018  Sean Cooke 05/16/1927 [id number]    Patient screened for potential [facility] Care Management services due to unplanned readmission risk score of 22% (high).  Spoke with inpatient LCSW. Disposition plans are for private pay at SNF.   Chart reviewed and noted patient likely to transition to long term care under private pay. No identifiable [facility] Care Management needs.    Marthenia Rolling, MSN-Ed, RN,BSN [name] [phone]

## 2018-09-24 NOTE — Plan of Care (Signed)
  Problem: Clinical Measurements: Goal: Ability to maintain clinical measurements within normal limits will improve Outcome: Progressing Goal: Will remain free from infection Outcome: Progressing Goal: Diagnostic test results will improve Outcome: Progressing Goal: Respiratory complications will improve Outcome: Progressing Goal: Cardiovascular complication will be avoided Outcome: Progressing   Problem: Activity: Goal: Risk for activity intolerance will decrease Outcome: Progressing   Problem: Fluid Volume: Goal: Hemodynamic stability will improve Outcome: Progressing   Problem: Clinical Measurements: Goal: Diagnostic test results will improve Outcome: Progressing Goal: Signs and symptoms of infection will decrease Outcome: Progressing

## 2018-09-24 NOTE — Progress Notes (Addendum)
Met with pt to update him on SNF beds available to him for private pay rehab. Wife not present, left voicemails to update her in order to move forward (pt defers to wife for ultimate decision and to arrange finances)  Sharren Bridge, MSW, LCSW Clinical Social Work 09/24/2018 [phone]  Met with pt's wife and pt again- aware of bed offer at [facility] for private pay admission. Wife reporting they now want to go directly to long term care instead of trying rehab- this is available at [facility] also. Pt's wife going to [facility] (where pt was for respite) to finalize getting pt's things there this afternoon, states she has appointment there at 2pm but will call CSW to move forward afterward. Aware pt ready for DC.

## 2018-09-24 NOTE — Discharge Summary (Signed)
Physician Discharge Summary  Sean Cooke OHY:[id number] DOB: 12/04/1926 DOA: 09/17/2018  PCP: [place] date: 09/17/2018 Discharge date: 09/24/2018  Time spent: 60 minutes  Recommendations for Outpatient Follow-up:  1. Patient to be discharged to a skilled nursing facility when bed available.  Follow-up with MD at skilled nursing facility.  Patient will need a basic metabolic profile done, magnesium done, CBC done in 1 week. 2. Outpatient follow-up with Dr. Lisbeth Renshaw as scheduled.   Discharge Diagnoses:  Principal Problem:   Sepsis ([place]) Active Problems:   BPH (benign prostatic hyperplasia)   Malignant neoplasm of right upper lobe of lung (HCC)   Pressure injury of skin   Bacteremia: Probable   Acute lower UTI   Type 2 diabetes mellitus without complication (HCC)   Protein-calorie malnutrition, severe   Fever   Bacterial infection due to Proteus mirabilis   Urinary tract infection due to Proteus   Discharge Condition: Stable and improved.  Diet recommendation: Dysphagia 3 diet.  Filed Weights   09/17/18 1140  Weight: 66.2 kg    History of present illness:  Per Dr. Verlon Au 85 year old male resident of [facility] usually alert oriented Prior adenocarcinoma stage III lung Newly diagnosed stage I T2N0 pancreatic cancer status post XRT 1/6 supposed to return to oncology clinic 2/6 BPH Chronic anemia Diastolic heart failure He was hospitalized recently through 08/02/2017 with scrotal cellulitis and need an indwelling Foley catheter He is unable to give history but per report patient was confused and altered today with a fever of 104 degrees He was unable to tell any history his wife was here but has left the ED at present time Per report Foley was having frank pus around it and was changed at the facility but no urine was cultured from this and he is not put out urine at this time   ED Course: Given IV fluid bolus then given  saline at 1 5 cc an hour started on empiric vancomycin and ceftriaxone--- urine to be obtained  Emergency room work-up reveals sodium 134 BUN/creatinine 17/0.9 albumin 3.3 initial lactic acid 2.1 currently 1.4 WBC 18.7 hemoglobin 10.7 platelet 468 Chest x-ray negative CT head negative  Hospital Course:  1 sepsis secondary to Proteus bacteremia and Proteus UTI. Patient presented confused.  Patient was pancultured and placed empirically on IV antibiotics, supportive care. On admission patient noted to have a fever of 104, confusion, tachycardia and tachypneic.  Patient's blood cultures positive for Proteus and urine cultures positive for Proteus.  Fever curve trended down.  Leukocytosis improved.  Patient initially placed on IV Rocephin and improved clinically and subsequently transitioned to oral Vantin to complete a 7-day course of antibiotic treatment. Discussed with ID who is in agreement with antibiotic duration and plan.  Patient be discharged on 1 more day of oral Vantin to complete a full course.  2.  Acute toxic metabolic encephalopathy Likely secondary to problem #1.  Patient was placed on IV antibiotics and monitored.  Patient improved clinically during the hospitalization and was back to baseline by day of discharge.  Outpatient follow-up.   3.  Hypertension On admission patient's blood pressure was borderline.  Patient's blood pressure improved.  Patient was resumed back on home regimen of Norvasc.  Patient's Cozaar dose was decreased to 25 mg daily.  Outpatient follow-up.   4.  Depression/insomnia Patient's trazodone was held during the hospitalization.  Resume in the outpatient setting.   5.  History of non-small cell lung  cancer stage IIIb status post chemotherapy in 2016 Oncology was informed of patient's admission via epic.  Outpatient follow-up.  6.  Recent diagnosis of pancreatic cancer status post XRT Admitting physician stated he sent an inbox message to Dr. Lisbeth Renshaw.   Outpatient follow-up.  7.  Diabetes mellitus type 2 Hemoglobin A1c was 6.1 on 06/13/2018.    Patient was seen by speech therapy and placed on a dysphagia 3 diet which he tolerated.  Patient maintained on sliding scale insulin.   8.  Senile osteoporosis Prolia was held during the hospitalization.  Resume in the outpatient setting.   9.  Anemia Likely dilutional.  Patient with no overt bleeding.  Hemoglobin remained stable at 9.1.  Anemia panel consistent with anemia of chronic disease.   10.  Hypokalemia/hypomagnesemia Repleted.  Magnesium at 1.5.  Given magnesium 4 g IV x1.  Patient started on oral magnesium oxide supplementation.  Outpatient follow-up  11.  Disposition Patient was awaiting skilled nursing facility placement as recommended per PT.  Insurance authorization was denied.  Had a peer to peer discussion with MD from insurance company who stated that patient was recently discharged from the hospital went to a skilled nursing facility and was just discharged back to assisted living facility.  It is felt per MD of insurance company that patient is now custodial care as he was stated that assisted living facility felt they could not manage patient at this time.  It was felt that patient's medical issues were more secondary to his cancer which he was receiving radiation treatment and was felt patient would likely not improve significantly.  Social worker working to aid in patient's disposition to nursing facility.    Procedures:  CT head 09/17/2018  Chest x-ray 09/17/2018  Consultations:  None  Discharge Exam: Vitals:   09/24/18 0420 09/24/18 0837  BP: 104/69 130/84  Pulse: 88 97  Resp: 16 (!) 26  Temp:  (!) 97.5 F (36.4 C)  SpO2: 94% 96%    General: NAD Cardiovascular: RRR Respiratory: CTAB  Discharge Instructions   Discharge Instructions    Diet general   Complete by:  As directed    Dysphagia 3 diet with thin liquids.  Aspiration precautions.   Increase  activity slowly   Complete by:  As directed      Allergies as of 09/24/2018      Reactions   Benzonatate Other (See Comments)   Dizziness   Lyrica [pregabalin] Diarrhea   Gabapentin Other (See Comments)   Constipation    Tizanidine Other (See Comments)   Unknown reaction - unable to remember   Penicillins Rash   Has patient had a PCN reaction causing immediate rash, facial/tongue/throat swelling, SOB or lightheadedness with hypotension: unknown Has patient had a PCN reaction causing severe rash involving mucus membranes or skin necrosis: unknown Has patient had a PCN reaction that required hospitalization : unknown Has patient had a PCN reaction occurring within the last 10 years: no, childhood allergy If all of the above answers are "NO", then may proceed with Cephalosporin use.   Tramadol Other (See Comments)   Constipation       Medication List    STOP taking these medications   guaiFENesin 600 MG 12 hr tablet Commonly known as:  MUCINEX   Lidocaine (Anorectal) 5 % Crea     TAKE these medications   ALIGN 4 MG Caps Take 4 mg by mouth daily.   amLODipine 5 MG tablet Commonly known as:  NORVASC Take 5  mg by mouth daily.   antiseptic oral rinse Liqd 15 mLs by Mouth Rinse route every 4 (four) hours as needed for dry mouth.   aspirin 81 MG tablet Take 81 mg by mouth at bedtime.   bisacodyl 5 MG EC tablet Commonly known as:  DULCOLAX Take 5 mg by mouth daily as needed for moderate constipation.   calcium-vitamin D 500-200 MG-UNIT tablet Take 1 tablet by mouth 2 (two) times daily.   cefpodoxime 200 MG tablet Commonly known as:  VANTIN Take 2 tablets (400 mg total) by mouth every 12 (twelve) hours for 1 day.   cromolyn 4 % ophthalmic solution Commonly known as:  OPTICROM Place 1 drop into both eyes 4 (four) times daily.   famotidine 20 MG tablet Commonly known as:  PEPCID Take 20 mg by mouth 2 (two) times daily.   feeding supplement (PRO-STAT SUGAR FREE 64)  Liqd Take 30 mLs by mouth 2 (two) times daily.   Fish Oil 1000 MG Caps Take 1,000 mg by mouth daily.   HUMALOG 100 UNIT/ML injection Generic drug:  insulin lispro Inject 2-4 Units into the skin 2 (two) times daily. Per sliding scale, if 181-300=2 units, 301+=4 units   ibuprofen 400 MG tablet Commonly known as:  ADVIL,MOTRIN Take 1 tablet (400 mg total) by mouth every 6 (six) hours as needed for headache or moderate pain. What changed:    medication strength  how much to take  when to take this  reasons to take this   lidocaine-prilocaine cream Commonly known as:  EMLA Apply 1 application topically as needed. What changed:  reasons to take this   loratadine 10 MG tablet Commonly known as:  CLARITIN Take 10 mg by mouth every morning.   losartan 25 MG tablet Commonly known as:  COZAAR Take 4 tablets (100 mg total) by mouth every morning. What changed:  medication strength   magnesium hydroxide 400 MG/5ML suspension Commonly known as:  MILK OF MAGNESIA Take 30 mLs by mouth at bedtime.   magnesium oxide 400 (241.3 Mg) MG tablet Commonly known as:  MAG-OX Take 1 tablet (400 mg total) by mouth 2 (two) times daily.   multivitamin tablet Take 1 tablet by mouth daily.   pantoprazole 40 MG tablet Commonly known as:  PROTONIX Take 40 mg by mouth daily.   polyethylene glycol packet Commonly known as:  MIRALAX / GLYCOLAX Take 17 g by mouth every other day.   PROLIA 60 MG/ML Soln injection Generic drug:  denosumab Inject 60 mg into the skin every 6 (six) months. Administer in upper arm, thigh, or abdomen   simethicone 80 MG chewable tablet Commonly known as:  MYLICON Chew 80 mg by mouth every 6 (six) hours as needed for flatulence.   tamsulosin 0.4 MG Caps capsule Commonly known as:  FLOMAX Take 0.4 mg by mouth at bedtime.   traZODone 50 MG tablet Commonly known as:  DESYREL Take 25 mg by mouth at bedtime.   Turmeric 500 MG Tabs Take 500 mg by mouth daily.       Allergies  Allergen Reactions  . Benzonatate Other (See Comments)    Dizziness  . Lyrica [Pregabalin] Diarrhea  . Gabapentin Other (See Comments)    Constipation   . Tizanidine Other (See Comments)    Unknown reaction - unable to remember  . Penicillins Rash    Has patient had a PCN reaction causing immediate rash, facial/tongue/throat swelling, SOB or lightheadedness with hypotension: unknown Has patient had a PCN  reaction causing severe rash involving mucus membranes or skin necrosis: unknown Has patient had a PCN reaction that required hospitalization : unknown Has patient had a PCN reaction occurring within the last 10 years: no, childhood allergy If all of the above answers are "NO", then may proceed with Cephalosporin use.   . Tramadol Other (See Comments)    Constipation    Follow-up Information    MD AT SNF Follow up.        Kyung Rudd, MD Follow up.   Specialty:  Radiation Oncology Why:  Follow-up as scheduled. Contact information: [address] [place] [address] [phone]            The results of significant diagnostics from this hospitalization (including imaging, microbiology, ancillary and laboratory) are listed below for reference.    Significant Diagnostic Studies: Dg Chest 2 View  Result Date: 09/17/2018 CLINICAL DATA:  Possible sepsis EXAM: CHEST - 2 VIEW COMPARISON:  07/17/2018 FINDINGS: Cardiac shadows within normal limits. Right chest wall port is noted in satisfactory position. The lungs are well aerated bilaterally. No focal infiltrate or sizable effusion is seen. No acute bony abnormality is noted. IMPRESSION: No active cardiopulmonary disease. Electronically Signed   By: Inez Catalina M.D.   On: 09/17/2018 12:03   Ct Head Wo Contrast  Result Date: 09/17/2018 CLINICAL DATA:  Altered level of consciousness EXAM: CT HEAD WITHOUT CONTRAST TECHNIQUE: Contiguous axial images were obtained from the base of the skull through the vertex  without intravenous contrast. COMPARISON:  July 17, 2018 FINDINGS: Brain: There is mild diffuse atrophy. There is no intracranial mass, hemorrhage, extra-axial fluid collection, or midline shift. There is patchy small vessel disease in the centra semiovale bilaterally. No acute infarct is demonstrable on this study. Vascular: There is no hyperdense vessel. There is calcification in each distal vertebral artery and carotid siphon region. Skull: The bony calvarium appears intact. Sinuses/Orbits: There is mucosal thickening in each medial maxillary antrum. There is opacification and mucosal thickening in several ethmoid air cells. Orbits appear symmetric bilaterally. Other: Mastoid air cells are clear. IMPRESSION: Stable atrophy with periventricular small vessel disease. No acute infarct evident. No mass or hemorrhage. There are foci of arterial vascular calcification. There are foci of paranasal sinus disease. Electronically Signed   By: Lowella Grip III M.D.   On: 09/17/2018 13:53    Microbiology: Recent Results (from the past 240 hour(s))  Culture, blood (Routine x 2)     Status: Abnormal   Collection Time: 09/17/18 11:28 AM  Result Value Ref Range Status   Specimen Description   Final    BLOOD BLOOD RIGHT FOREARM Performed at [facility] [address], [place], [place] [address]    Special Requests   Final    BOTTLES DRAWN AEROBIC AND ANAEROBIC Blood Culture adequate volume Performed at [facility] [address] [place], [place] [address]    Culture  Setup Time   Final    GRAM NEGATIVE RODS IN BOTH AEROBIC AND ANAEROBIC BOTTLES CRITICAL RESULT CALLED TO, READ BACK BY AND VERIFIED WITH: B. GREEN,PHARMD 0425 09/18/2018 Mena Goes Performed at [facility], [place] [address], [place], [place] [address]    Culture PROTEUS MIRABILIS (A)  Final   Report Status 09/20/2018 FINAL  Final   Organism ID, Bacteria PROTEUS MIRABILIS  Final       Susceptibility   Proteus mirabilis - MIC*    AMPICILLIN <=2 SENSITIVE Sensitive  CEFAZOLIN <=4 SENSITIVE Sensitive     CEFEPIME <=1 SENSITIVE Sensitive     CEFTAZIDIME <=1 SENSITIVE Sensitive     CEFTRIAXONE <=1 SENSITIVE Sensitive     CIPROFLOXACIN <=0.25 SENSITIVE Sensitive     GENTAMICIN <=1 SENSITIVE Sensitive     IMIPENEM 2 SENSITIVE Sensitive     TRIMETH/SULFA <=20 SENSITIVE Sensitive     AMPICILLIN/SULBACTAM <=2 SENSITIVE Sensitive     PIP/TAZO <=4 SENSITIVE Sensitive     * PROTEUS MIRABILIS  Blood Culture ID Panel (Reflexed)     Status: Abnormal   Collection Time: 09/17/18 11:28 AM  Result Value Ref Range Status   Enterococcus species NOT DETECTED NOT DETECTED Final   Listeria monocytogenes NOT DETECTED NOT DETECTED Final   Staphylococcus species NOT DETECTED NOT DETECTED Final   Staphylococcus aureus (BCID) NOT DETECTED NOT DETECTED Final   Streptococcus species NOT DETECTED NOT DETECTED Final   Streptococcus agalactiae NOT DETECTED NOT DETECTED Final   Streptococcus pneumoniae NOT DETECTED NOT DETECTED Final   Streptococcus pyogenes NOT DETECTED NOT DETECTED Final   Acinetobacter baumannii NOT DETECTED NOT DETECTED Final   Enterobacteriaceae species DETECTED (A) NOT DETECTED Final    Comment: Enterobacteriaceae represent a large family of gram-negative bacteria, not a single organism. CRITICAL RESULT CALLED TO, READ BACK BY AND VERIFIED WITH: B. GREEN,PHARMD 0425 09/18/2018 T. TYSOR    Enterobacter cloacae complex NOT DETECTED NOT DETECTED Final   Escherichia coli NOT DETECTED NOT DETECTED Final   Klebsiella oxytoca NOT DETECTED NOT DETECTED Final   Klebsiella pneumoniae NOT DETECTED NOT DETECTED Final   Proteus species DETECTED (A) NOT DETECTED Final    Comment: CRITICAL RESULT CALLED TO, READ BACK BY AND VERIFIED WITH: B. GREEN,PHARMD 0425 09/18/2018 T. TYSOR    Serratia marcescens NOT DETECTED NOT DETECTED Final   Carbapenem resistance NOT DETECTED NOT DETECTED  Final   Haemophilus influenzae NOT DETECTED NOT DETECTED Final   Neisseria meningitidis NOT DETECTED NOT DETECTED Final   Pseudomonas aeruginosa NOT DETECTED NOT DETECTED Final   Candida albicans NOT DETECTED NOT DETECTED Final   Candida glabrata NOT DETECTED NOT DETECTED Final   Candida krusei NOT DETECTED NOT DETECTED Final   Candida parapsilosis NOT DETECTED NOT DETECTED Final   Candida tropicalis NOT DETECTED NOT DETECTED Final    Comment: Performed at [facility], [place] [address], [place], [place] [address]  Culture, blood (Routine x 2)     Status: Abnormal   Collection Time: 09/17/18 11:34 AM  Result Value Ref Range Status   Specimen Description   Final    BLOOD RIGHT HAND Performed at [facility] [address], [place], [place] [address]    Special Requests   Final    BOTTLES DRAWN AEROBIC AND ANAEROBIC Blood Culture results may not be optimal due to an inadequate volume of blood received in culture bottles Performed at [facility] [address] [place], [place] [address]    Culture  Setup Time   Final    GRAM NEGATIVE RODS IN BOTH AEROBIC AND ANAEROBIC BOTTLES CRITICAL VALUE NOTED.  VALUE IS CONSISTENT WITH PREVIOUSLY REPORTED AND CALLED VALUE.    Culture (A)  Final    PROTEUS MIRABILIS SUSCEPTIBILITIES PERFORMED ON PREVIOUS CULTURE WITHIN THE LAST 5 DAYS. Performed at [facility], [place] [address], [place], [name] [address]    Report Status 09/20/2018 FINAL  Final  Culture, Urine     Status: Abnormal   Collection Time: 09/17/18  6:40  PM  Result Value Ref Range Status   Specimen Description   Final    URINE, CATHETERIZED Performed at [facility] [address], [place], [place] [address]    Special Requests   Final    Normal Performed at [facility], [place] [address], [place], [place] [address]    Culture >=100,000 COLONIES/mL PROTEUS MIRABILIS (A)  Final   Report Status  09/20/2018 FINAL  Final   Organism ID, Bacteria PROTEUS MIRABILIS (A)  Final      Susceptibility   Proteus mirabilis - MIC*    AMPICILLIN <=2 SENSITIVE Sensitive     CEFAZOLIN <=4 SENSITIVE Sensitive     CEFTRIAXONE <=1 SENSITIVE Sensitive     CIPROFLOXACIN <=0.25 SENSITIVE Sensitive     GENTAMICIN <=1 SENSITIVE Sensitive     IMIPENEM 1 SENSITIVE Sensitive     NITROFURANTOIN 128 RESISTANT Resistant     TRIMETH/SULFA <=20 SENSITIVE Sensitive     AMPICILLIN/SULBACTAM <=2 SENSITIVE Sensitive     PIP/TAZO <=4 SENSITIVE Sensitive     * >=100,000 COLONIES/mL PROTEUS MIRABILIS  MRSA PCR Screening     Status: None   Collection Time: 09/17/18  9:27 PM  Result Value Ref Range Status   MRSA by PCR NEGATIVE NEGATIVE Final    Comment:        The GeneXpert MRSA Assay (FDA approved for NASAL specimens only), is one component of a comprehensive MRSA colonization surveillance program. It is not intended to diagnose MRSA infection nor to guide or monitor treatment for MRSA infections. Performed at [facility], [place] [address] [place], [place] [address]      Labs: Basic Metabolic Panel: Recent Labs  Lab 09/20/18 0728 09/21/18 0238 09/22/18 0254 09/23/18 0511 09/24/18 0529  NA 136 137 134* 133* 132*  K 3.7 3.9 4.1 4.0 4.3  CL 109 108 102 101 98  CO2 23 24 25 25 26   GLUCOSE 175* 209* 180* 203* 169*  BUN 12 11 9 9 12   CREATININE 0.47* 0.42* 0.41* 0.49* 0.60*  CALCIUM 9.7 9.5 9.9 10.2 10.5*  MG 1.6* 1.7 1.8 1.5* 1.5*   Liver Function Tests: Recent Labs  Lab 09/17/18 1149 09/18/18 0452  AST 27 45*  ALT 18 20  ALKPHOS 86 68  BILITOT 0.8 0.9  PROT 7.2 5.8*  ALBUMIN 3.3* 2.5*   No results for input(s): LIPASE, AMYLASE in the last 168 hours. No results for input(s): AMMONIA in the last 168 hours. CBC: Recent Labs  Lab 09/17/18 1149 09/18/18 0452 09/19/18 0412 09/20/18 0728 09/21/18 0238 09/22/18 0254 09/23/18 0511  WBC 18.7* 16.9* 13.1* 10.3 11.9*  10.9* 11.8*  NEUTROABS 16.4* 15.1* 10.9* 7.0 8.3*  --   --   HGB 10.7* 10.4* 9.8* 9.5* 8.6* 9.2* 9.1*  HCT 34.5* 33.3* 31.5* 30.0* 27.6* 30.3* 29.0*  MCV 100.6* 98.8 99.1 96.8 98.9 103.4* 98.6  PLT 468* 374 309 328 365 382 475*   Cardiac Enzymes: No results for input(s): CKTOTAL, CKMB, CKMBINDEX, TROPONINI in the last 168 hours. BNP: BNP (last 3 results) Recent Labs    07/17/18 1243  BNP 268.7*    ProBNP (last 3 results) No results for input(s): PROBNP in the last 8760 hours.  CBG: Recent Labs  Lab 09/23/18 0756 09/23/18 1232 09/23/18 1723 09/23/18 2021 09/24/18 0757  GLUCAP 196* 284* 169* 123* 183*       Signed:  Irine Seal MD.  [facility] 09/24/2018, 11:12 AM

## 2018-09-25 DIAGNOSIS — E43 Unspecified severe protein-calorie malnutrition: Secondary | ICD-10-CM

## 2018-09-25 DIAGNOSIS — N171 Acute kidney failure with acute cortical necrosis: Secondary | ICD-10-CM

## 2018-09-25 DIAGNOSIS — R7881 Bacteremia: Secondary | ICD-10-CM

## 2018-09-25 DIAGNOSIS — A498 Other bacterial infections of unspecified site: Secondary | ICD-10-CM

## 2018-09-25 DIAGNOSIS — C3411 Malignant neoplasm of upper lobe, right bronchus or lung: Secondary | ICD-10-CM

## 2018-09-25 DIAGNOSIS — N401 Enlarged prostate with lower urinary tract symptoms: Secondary | ICD-10-CM

## 2018-09-25 DIAGNOSIS — N390 Urinary tract infection, site not specified: Secondary | ICD-10-CM

## 2018-09-25 DIAGNOSIS — E119 Type 2 diabetes mellitus without complications: Secondary | ICD-10-CM

## 2018-09-25 DIAGNOSIS — R338 Other retention of urine: Secondary | ICD-10-CM

## 2018-09-25 DIAGNOSIS — A419 Sepsis, unspecified organism: Secondary | ICD-10-CM

## 2018-09-25 DIAGNOSIS — B964 Proteus (mirabilis) (morganii) as the cause of diseases classified elsewhere: Secondary | ICD-10-CM

## 2018-09-25 DIAGNOSIS — R6520 Severe sepsis without septic shock: Secondary | ICD-10-CM

## 2018-09-25 LAB — GLUCOSE, CAPILLARY
Glucose-Capillary: 193 mg/dL — ABNORMAL HIGH (ref 70–99)
Glucose-Capillary: 259 mg/dL — ABNORMAL HIGH (ref 70–99)
Glucose-Capillary: 319 mg/dL — ABNORMAL HIGH (ref 70–99)
Glucose-Capillary: 236 mg/dL — ABNORMAL HIGH (ref 70–99)

## 2018-09-25 LAB — MAGNESIUM: Magnesium: 1.6 mg/dL — ABNORMAL LOW (ref 1.7–2.4)

## 2018-09-25 MED ORDER — TRAZODONE HCL 50 MG PO TABS
50.0000 mg | ORAL_TABLET | Freq: Once | ORAL | Status: AC
  Administered 2018-09-25: 50 mg via ORAL
  Filled 2018-09-25: qty 1

## 2018-09-25 NOTE — Progress Notes (Signed)
PROGRESS NOTE  Sean Cooke:[id number] DOB: October 07, 1926 DOA: 09/17/2018 PCP: [place]   LOS: 8 days   Brief narrative: Patient is a 85 year old resident of [facility] usually alert oriented, prior history of adenocarcinoma stage III of the lung, newly diagnosed stage I T2N0 pancreatic cancer status post XRT 08/19/2018, supposed to return to oncology clinic 09/19/2018, history of BPH, diastolic heart failure, recent hospitalization 07/21/2018 for scrotal cellulitis and need of indwelling Foley catheter who presented to the ED with confusion, fever 104 and Foley noted to have frank pus around it which was changed at the facility.  On admission patient noted to be septic and sepsis work-up underway.  Patient placed empirically on IV antibiotics.  Assessment/Plan:  Principal Problem:   Sepsis ([place]) Active Problems:   BPH (benign prostatic hyperplasia)   Malignant neoplasm of right upper lobe of lung (HCC)   Pressure injury of skin   Bacteremia: Probable   Acute lower UTI   Type 2 diabetes mellitus without complication (HCC)   Protein-calorie malnutrition, severe   Fever   Bacterial infection due to Proteus mirabilis   Urinary tract infection due to Proteus  Sepsis secondary to Proteus bacteremia and UTI Blood cultures and urine cultures were positive for Proteus.  Patient initially received IV Rocephin but will be continued on oral Vantin to complete the course.  This was discussed with the previous provider with the infectious disease as well.  Acute metabolic encephalopathy.  Improved.  Following commands.   Essential hypertension.  Continue Cozaar and Norvasc.  Closely monitor.  Depression and insomnia.  Continue trazodone  History of non-small cell lung cancer stage IIIb status post chemotherapy in 2016 Will need outpatient follow-up.  Recent diagnosis of pancreatic cancer status post XRT Follow-up as outpatient.  Diabetes  mellitus type 2 Continue on sliding scale insulin, Accu-Cheks diabetic diet.  Dysphagia 3 diet.  Last hemoglobin A1c of 6.1   Anemia likely of chronic disease. No need for blood transfusion.    Hypokalemia/hypomagnesemia Improved with replacement.   VTE Prophylaxis: Lovenox  Code Status: DNR  Family Communication: No one was available at bedside  Disposition Plan: Assisted living facility   Consultants:  None  Procedures:  CT head 09/17/2018  Chest x-ray 09/17/2018  Antibiotics: Anti-infectives (From admission, onward)   Start     Dose/Rate Route Frequency Ordered Stop   09/24/18 0000  cefpodoxime (VANTIN) 200 MG tablet     400 mg Oral Every 12 hours 09/24/18 1101 09/25/18 2359   09/21/18 1000  cefpodoxime (VANTIN) tablet 400 mg     400 mg Oral Every 12 hours 09/20/18 1146 09/24/18 2133   09/18/18 0800  cefTRIAXone (ROCEPHIN) 2 g in sodium chloride 0.9 % 100 mL IVPB  Status:  Discontinued     2 g 200 mL/hr over 30 Minutes Intravenous Every 24 hours 09/18/18 0451 09/20/18 1145   09/17/18 2200  ceFEPIme (MAXIPIME) 1 g in sodium chloride 0.9 % 100 mL IVPB  Status:  Discontinued     1 g 200 mL/hr over 30 Minutes Intravenous Every 12 hours 09/17/18 1556 09/18/18 0450   09/17/18 2200  vancomycin (VANCOCIN) IVPB 1000 mg/200 mL premix  Status:  Discontinued     1,000 mg 200 mL/hr over 60 Minutes Intravenous Every 24 hours 09/17/18 1556 09/18/18 0450   09/17/18 1530  ceFEPIme (MAXIPIME) 2 g in sodium chloride 0.9 % 100 mL IVPB  Status:  Discontinued     2 g 200  mL/hr over 30 Minutes Intravenous  Once 09/17/18 1523 09/17/18 1556   09/17/18 1530  vancomycin (VANCOCIN) IVPB 1000 mg/200 mL premix  Status:  Discontinued     1,000 mg 200 mL/hr over 60 Minutes Intravenous  Once 09/17/18 1523 09/17/18 1556   09/17/18 1200  vancomycin (VANCOCIN) IVPB 1000 mg/200 mL premix     1,000 mg 200 mL/hr over 60 Minutes Intravenous  Once 09/17/18 1147 09/17/18 1417   09/17/18 1200   ceFEPIme (MAXIPIME) 2 g in sodium chloride 0.9 % 100 mL IVPB     2 g 200 mL/hr over 30 Minutes Intravenous  Once 09/17/18 1147 09/17/18 1250       Subjective: Patient denies interval complaints.  Denies nausea, vomiting, shortness of breath or chest pain.  Denies urinary urgency, frequency or dysuria  Objective: Vitals:   09/25/18 0359 09/25/18 1240  BP: 136/74 129/77  Pulse: 91 89  Resp: 18 17  Temp: 97.9 F (36.6 C) 98 F (36.7 C)  SpO2: 94% 96%    Intake/Output Summary (Last 24 hours) at 09/25/2018 1549 Last data filed at 09/25/2018 1245 Gross per 24 hour  Intake 240 ml  Output 825 ml  Net -585 ml   Filed Weights   09/17/18 1140  Weight: 66.2 kg   Body mass index is 22.86 kg/m.   Physical Exam: GENERAL: Patient is alert awake and communicative.  Not in obvious distress. HENT: No scleral pallor or icterus. Pupils equally reactive to light. Oral mucosa is moist NECK: is supple, no palpable thyroid enlargement. CHEST: Clear to auscultation. No crackles or wheezes. Non tender on palpation. Diminished breath sounds bilaterally. CVS: S1 and S2 heard, no murmur. Regular rate and rhythm. No pericardial rub. ABDOMEN: Soft, non-tender, bowel sounds are present. No palpable hepato-splenomegaly. EXTREMITIES: No edema. CNS: Cranial nerves are intact. No focal motor or sensory deficits.  Moves all extremities. SKIN: warm and dry without rashes.  Data Review: I have personally reviewed the following laboratory data and studies,  CBC: Recent Labs  Lab 09/19/18 0412 09/20/18 0728 09/21/18 0238 09/22/18 0254 09/23/18 0511  WBC 13.1* 10.3 11.9* 10.9* 11.8*  NEUTROABS 10.9* 7.0 8.3*  --   --   HGB 9.8* 9.5* 8.6* 9.2* 9.1*  HCT 31.5* 30.0* 27.6* 30.3* 29.0*  MCV 99.1 96.8 98.9 103.4* 98.6  PLT 309 328 365 382 342*   Basic Metabolic Panel: Recent Labs  Lab 09/20/18 0728 09/21/18 0238 09/22/18 0254 09/23/18 0511 09/24/18 0529 09/25/18 0429  NA 136 137 134* 133*  132*  --   K 3.7 3.9 4.1 4.0 4.3  --   CL 109 108 102 101 98  --   CO2 23 24 25 25 26   --   GLUCOSE 175* 209* 180* 203* 169*  --   BUN 12 11 9 9 12   --   CREATININE 0.47* 0.42* 0.41* 0.49* 0.60*  --   CALCIUM 9.7 9.5 9.9 10.2 10.5*  --   MG 1.6* 1.7 1.8 1.5* 1.5* 1.6*   Liver Function Tests: No results for input(s): AST, ALT, ALKPHOS, BILITOT, PROT, ALBUMIN in the last 168 hours. No results for input(s): LIPASE, AMYLASE in the last 168 hours. No results for input(s): AMMONIA in the last 168 hours. Cardiac Enzymes: No results for input(s): CKTOTAL, CKMB, CKMBINDEX, TROPONINI in the last 168 hours. BNP (last 3 results) Recent Labs    07/17/18 1243  BNP 268.7*    ProBNP (last 3 results) No results for input(s): PROBNP in the last 8760  hours.  CBG: Recent Labs  Lab 09/24/18 1137 09/24/18 1644 09/24/18 2241 09/25/18 0756 09/25/18 1126  GLUCAP 290* 206* 309* 193* 259*   Recent Results (from the past 240 hour(s))  Culture, blood (Routine x 2)     Status: Abnormal   Collection Time: 09/17/18 11:28 AM  Result Value Ref Range Status   Specimen Description   Final    BLOOD BLOOD RIGHT FOREARM Performed at [facility] [address] [place], [place] [address]    Special Requests   Final    BOTTLES DRAWN AEROBIC AND ANAEROBIC Blood Culture adequate volume Performed at [facility] [address], [place], [place] [address]    Culture  Setup Time   Final    GRAM NEGATIVE RODS IN BOTH AEROBIC AND ANAEROBIC BOTTLES CRITICAL RESULT CALLED TO, READ BACK BY AND VERIFIED WITH: B. GREEN,PHARMD 0425 09/18/2018 Mena Goes Performed at [facility], [place] [address], [place], [place] [address]    Culture PROTEUS MIRABILIS (A)  Final   Report Status 09/20/2018 FINAL  Final   Organism ID, Bacteria PROTEUS MIRABILIS  Final      Susceptibility   Proteus mirabilis - MIC*    AMPICILLIN <=2 SENSITIVE Sensitive     CEFAZOLIN <=4 SENSITIVE  Sensitive     CEFEPIME <=1 SENSITIVE Sensitive     CEFTAZIDIME <=1 SENSITIVE Sensitive     CEFTRIAXONE <=1 SENSITIVE Sensitive     CIPROFLOXACIN <=0.25 SENSITIVE Sensitive     GENTAMICIN <=1 SENSITIVE Sensitive     IMIPENEM 2 SENSITIVE Sensitive     TRIMETH/SULFA <=20 SENSITIVE Sensitive     AMPICILLIN/SULBACTAM <=2 SENSITIVE Sensitive     PIP/TAZO <=4 SENSITIVE Sensitive     * PROTEUS MIRABILIS  Blood Culture ID Panel (Reflexed)     Status: Abnormal   Collection Time: 09/17/18 11:28 AM  Result Value Ref Range Status   Enterococcus species NOT DETECTED NOT DETECTED Final   Listeria monocytogenes NOT DETECTED NOT DETECTED Final   Staphylococcus species NOT DETECTED NOT DETECTED Final   Staphylococcus aureus (BCID) NOT DETECTED NOT DETECTED Final   Streptococcus species NOT DETECTED NOT DETECTED Final   Streptococcus agalactiae NOT DETECTED NOT DETECTED Final   Streptococcus pneumoniae NOT DETECTED NOT DETECTED Final   Streptococcus pyogenes NOT DETECTED NOT DETECTED Final   Acinetobacter baumannii NOT DETECTED NOT DETECTED Final   Enterobacteriaceae species DETECTED (A) NOT DETECTED Final    Comment: Enterobacteriaceae represent a large family of gram-negative bacteria, not a single organism. CRITICAL RESULT CALLED TO, READ BACK BY AND VERIFIED WITH: B. GREEN,PHARMD 0425 09/18/2018 T. TYSOR    Enterobacter cloacae complex NOT DETECTED NOT DETECTED Final   Escherichia coli NOT DETECTED NOT DETECTED Final   Klebsiella oxytoca NOT DETECTED NOT DETECTED Final   Klebsiella pneumoniae NOT DETECTED NOT DETECTED Final   Proteus species DETECTED (A) NOT DETECTED Final    Comment: CRITICAL RESULT CALLED TO, READ BACK BY AND VERIFIED WITH: B. GREEN,PHARMD 0425 09/18/2018 T. TYSOR    Serratia marcescens NOT DETECTED NOT DETECTED Final   Carbapenem resistance NOT DETECTED NOT DETECTED Final   Haemophilus influenzae NOT DETECTED NOT DETECTED Final   Neisseria meningitidis NOT DETECTED NOT  DETECTED Final   Pseudomonas aeruginosa NOT DETECTED NOT DETECTED Final   Candida albicans NOT DETECTED NOT DETECTED Final   Candida glabrata NOT DETECTED NOT DETECTED Final   Candida krusei NOT DETECTED NOT DETECTED Final   Candida parapsilosis NOT DETECTED NOT DETECTED Final  Candida tropicalis NOT DETECTED NOT DETECTED Final    Comment: Performed at [facility], [place] [address], [place], [place] [address]  Culture, blood (Routine x 2)     Status: Abnormal   Collection Time: 09/17/18 11:34 AM  Result Value Ref Range Status   Specimen Description   Final    BLOOD RIGHT HAND Performed at [facility] [address], [place], [place] [address]    Special Requests   Final    BOTTLES DRAWN AEROBIC AND ANAEROBIC Blood Culture results may not be optimal due to an inadequate volume of blood received in culture bottles Performed at [facility] [address], [place], [place] [address]    Culture  Setup Time   Final    GRAM NEGATIVE RODS IN BOTH AEROBIC AND ANAEROBIC BOTTLES CRITICAL VALUE NOTED.  VALUE IS CONSISTENT WITH PREVIOUSLY REPORTED AND CALLED VALUE.    Culture (A)  Final    PROTEUS MIRABILIS SUSCEPTIBILITIES PERFORMED ON PREVIOUS CULTURE WITHIN THE LAST 5 DAYS. Performed at [facility], [place] [address] [place], [place] [address]    Report Status 09/20/2018 FINAL  Final  Culture, Urine     Status: Abnormal   Collection Time: 09/17/18  6:40 PM  Result Value Ref Range Status   Specimen Description   Final    URINE, CATHETERIZED Performed at [facility] [address], [place], [place] [address]    Special Requests   Final    Normal Performed at [facility], [place] [address] [place], [place] [address]    Culture >=100,000 COLONIES/mL PROTEUS MIRABILIS (A)  Final   Report Status 09/20/2018 FINAL  Final   Organism ID, Bacteria PROTEUS MIRABILIS (A)  Final      Susceptibility    Proteus mirabilis - MIC*    AMPICILLIN <=2 SENSITIVE Sensitive     CEFAZOLIN <=4 SENSITIVE Sensitive     CEFTRIAXONE <=1 SENSITIVE Sensitive     CIPROFLOXACIN <=0.25 SENSITIVE Sensitive     GENTAMICIN <=1 SENSITIVE Sensitive     IMIPENEM 1 SENSITIVE Sensitive     NITROFURANTOIN 128 RESISTANT Resistant     TRIMETH/SULFA <=20 SENSITIVE Sensitive     AMPICILLIN/SULBACTAM <=2 SENSITIVE Sensitive     PIP/TAZO <=4 SENSITIVE Sensitive     * >=100,000 COLONIES/mL PROTEUS MIRABILIS  MRSA PCR Screening     Status: None   Collection Time: 09/17/18  9:27 PM  Result Value Ref Range Status   MRSA by PCR NEGATIVE NEGATIVE Final    Comment:        The GeneXpert MRSA Assay (FDA approved for NASAL specimens only), is one component of a comprehensive MRSA colonization surveillance program. It is not intended to diagnose MRSA infection nor to guide or monitor treatment for MRSA infections. Performed at [facility], [place] [address] [place],  [address]      Studies: No results found.  Scheduled Meds: . amLODipine  5 mg Oral QPM  . cromolyn  1 drop Both Eyes QID  . enoxaparin (LOVENOX) injection  40 mg Subcutaneous Q24H  . famotidine  20 mg Oral BID  . feeding supplement (ENSURE ENLIVE)  237 mL Oral TID BM  . insulin aspart  0-9 Units Subcutaneous TID WC  . losartan  25 mg Oral Daily  . magnesium oxide  400 mg Oral BID  . pantoprazole  40 mg Oral Daily  . tamsulosin  0.4 mg Oral QHS  Continuous Infusions:   Flora Lipps, MD  [facility] 09/25/2018

## 2018-09-25 NOTE — Progress Notes (Signed)
Inpatient Diabetes Program Recommendations  AACE/ADA: New Consensus Statement on Inpatient Glycemic Control (2015)  Target Ranges:  Prepandial:   less than 140 mg/dL      Peak postprandial:   less than 180 mg/dL (1-2 hours)      Critically ill patients:  140 - 180 mg/dL   Lab Results  Component Value Date   GLUCAP 259 (H) 09/25/2018   HGBA1C 6.1 (H) 06/13/2018    Review of Glycemic Control Results for Sean Cooke, Sean Cooke (MRN [id number]) as of 09/25/2018 15:11  Ref. Range 09/24/2018 11:37 09/24/2018 16:44 09/24/2018 22:41 09/25/2018 07:56 09/25/2018 11:26  Glucose-Capillary Latest Ref Range: 70 - 99 mg/dL 290 (H) 206 (H) 309 (H) 193 (H) 259 (H)   Inpatient Diabetes Program Recommendations:   CBGs elevated with supplements. Novolog 3 units tid meal coverage if eats 50% or drinks supplement  Thank you, Nani Gasser. Braydin Aloi, RN, MSN, CDE  Diabetes Coordinator Inpatient Glycemic Control Team Team Pager [phone] (8am-5pm) 09/25/2018 3:12 PM

## 2018-09-25 NOTE — Progress Notes (Addendum)
CSW still working with pt/wife to proceed with DC planning. Today, pt's wife states she feels that she would like to keep pt at ALF level of care for as long as possible- no longer wanting to pay out of pocket for a rehab stay at SNF or move directly into a SNF for long term care if this can be avoided and is clinically appropriate for an ALF. Is discussing with [facility] (where pt has been for a respite stay PTA). CSW left voicemail with Lilly at [facility] to discuss. Pt's wife aware he is medically ready for hospital discharge.  Sharren Bridge, MSW, LCSW Clinical Social Work 09/25/2018 [phone]  Spoke with Ralph Leyden at [facility] and provided referral information for pt. Awaiting review.

## 2018-09-26 LAB — GLUCOSE, CAPILLARY
Glucose-Capillary: 188 mg/dL — ABNORMAL HIGH (ref 70–99)
Glucose-Capillary: 263 mg/dL — ABNORMAL HIGH (ref 70–99)
Glucose-Capillary: 225 mg/dL — ABNORMAL HIGH (ref 70–99)
Glucose-Capillary: 257 mg/dL — ABNORMAL HIGH (ref 70–99)

## 2018-09-26 NOTE — Progress Notes (Signed)
Left voicemail with Lilly at [facility] re pt's potential admission there.  Sharren Bridge, MSW, LCSW Clinical Social Work 09/26/2018 [phone]

## 2018-09-26 NOTE — Progress Notes (Signed)
[facility] staff finalizing plans with pt's wife and niece for his moving into ALF. Will notify CSW when ready to receive pt or when any assistance here needed.  Sharren Bridge, MSW, LCSW Clinical Social Work 09/26/2018 [phone]

## 2018-09-26 NOTE — Care Management Important Message (Signed)
Important Message  Patient Details  Name: Sean Cooke MRN: [id number] Date of Birth: Jan 25, 1927   Medicare Important Message Given:  Yes    Kerin Salen 09/26/2018, 10:09 AMImportant Message  Patient Details  Name: Sean Cooke MRN: [id number] Date of Birth: 12-30-1926   Medicare Important Message Given:  Yes    Kerin Salen 09/26/2018, 10:09 AM

## 2018-09-26 NOTE — Progress Notes (Signed)
Assumed care of patient from previous nurse. Agree with previous nurses assessment. Will continue to monitor.

## 2018-09-26 NOTE — Progress Notes (Signed)
PROGRESS NOTE  Sean Cooke ZOX:[id number] DOB: [birth year] DOA: 09/17/2018 PCP: [place]   LOS: 9 days   Brief narrative: Patient is a 85 year old resident of [facility] usually alert awake and oriented, prior history of adenocarcinoma stage III of the lung, newly diagnosed stage I T2N0 pancreatic cancer status post XRT 08/19/2018, supposed to return to oncology clinic 09/19/2018, history of BPH, diastolic heart failure, recent hospitalization 07/21/2018 for scrotal cellulitis and need of indwelling Foley catheter who presented to the ED with confusion, fever 104F and Foley noted to have frank pus around it which was changed at the facility.  On admission, patient noted to be septic and was placed empirically on IV antibiotics.  Assessment/Plan:  Principal Problem:   Sepsis ([place]) Active Problems:   BPH (benign prostatic hyperplasia)   Malignant neoplasm of right upper lobe of lung (HCC)   Pressure injury of skin   Bacteremia: Probable   Acute lower UTI   Type 2 diabetes mellitus without complication (HCC)   Protein-calorie malnutrition, severe   Fever   Bacterial infection due to Proteus mirabilis   Urinary tract infection due to Proteus  Sepsis secondary to Proteus bacteremia and UTI Blood cultures and urine cultures were positive for Proteus.  Patient initially received IV Rocephin but will be continued on oral Vantin to complete the course.   Acute metabolic encephalopathy.  Improved.     Essential hypertension.  Continue Cozaar and Norvasc.  Closely monitor.  Depression and insomnia.  Continue trazodone  History of non-small cell lung cancer stage IIIb status post chemotherapy in 2016 Will need outpatient follow-up.  Recent diagnosis of pancreatic cancer status post XRT Follow-up as outpatient.  Diabetes mellitus type 2 Continue on sliding scale insulin, Accu-Cheks diabetic diet.  Dysphagia 3 diet.  Last hemoglobin A1c of  6.1   Anemia likely of chronic disease. No need for blood transfusion.    Hypokalemia/hypomagnesemia Improved with replacement. Check labs in am.   VTE Prophylaxis: Lovenox  Code Status: DNR  Family Communication: No one was available at bedside  Disposition Plan: Assisted living facility.  Medically stable for disposition.   Consultants:  None  Procedures:  CT head 09/17/2018  Chest x-ray 09/17/2018  Antibiotics: Anti-infectives (From admission, onward)   Start     Dose/Rate Route Frequency Ordered Stop   09/24/18 0000  cefpodoxime (VANTIN) 200 MG tablet     400 mg Oral Every 12 hours 09/24/18 1101 09/25/18 2359   09/21/18 1000  cefpodoxime (VANTIN) tablet 400 mg     400 mg Oral Every 12 hours 09/20/18 1146 09/24/18 2133   09/18/18 0800  cefTRIAXone (ROCEPHIN) 2 g in sodium chloride 0.9 % 100 mL IVPB  Status:  Discontinued     2 g 200 mL/hr over 30 Minutes Intravenous Every 24 hours 09/18/18 0451 09/20/18 1145   09/17/18 2200  ceFEPIme (MAXIPIME) 1 g in sodium chloride 0.9 % 100 mL IVPB  Status:  Discontinued     1 g 200 mL/hr over 30 Minutes Intravenous Every 12 hours 09/17/18 1556 09/18/18 0450   09/17/18 2200  vancomycin (VANCOCIN) IVPB 1000 mg/200 mL premix  Status:  Discontinued     1,000 mg 200 mL/hr over 60 Minutes Intravenous Every 24 hours 09/17/18 1556 09/18/18 0450   09/17/18 1530  ceFEPIme (MAXIPIME) 2 g in sodium chloride 0.9 % 100 mL IVPB  Status:  Discontinued     2 g 200 mL/hr over 30 Minutes Intravenous  Once  09/17/18 1523 09/17/18 1556   09/17/18 1530  vancomycin (VANCOCIN) IVPB 1000 mg/200 mL premix  Status:  Discontinued     1,000 mg 200 mL/hr over 60 Minutes Intravenous  Once 09/17/18 1523 09/17/18 1556   09/17/18 1200  vancomycin (VANCOCIN) IVPB 1000 mg/200 mL premix     1,000 mg 200 mL/hr over 60 Minutes Intravenous  Once 09/17/18 1147 09/17/18 1417   09/17/18 1200  ceFEPIme (MAXIPIME) 2 g in sodium chloride 0.9 % 100 mL IVPB     2 g 200  mL/hr over 30 Minutes Intravenous  Once 09/17/18 1147 09/17/18 1250      Subjective: Patient denies interval complaints.  He denies any shortness of breath cough fever nausea vomiting  Objective: Vitals:   09/25/18 2008 09/26/18 0410  BP: 130/67 123/70  Pulse: 98 90  Resp: 18 (!) 21  Temp: 98.2 F (36.8 C) 97.7 F (36.5 C)  SpO2: 96% 93%    Intake/Output Summary (Last 24 hours) at 09/26/2018 1317 Last data filed at 09/26/2018 0900 Gross per 24 hour  Intake 500 ml  Output 400 ml  Net 100 ml   Filed Weights   09/17/18 1140  Weight: 66.2 kg   Body mass index is 22.86 kg/m.   Physical Exam: General:  Average built, not in obvious distress.  Thinly built HENT: Normocephalic, pupils equally reacting to light and accommodation.  No scleral pallor or icterus noted. Oral mucosa is moist.  Chest:  Clear breath sounds.  Diminished breath sounds bilaterally. No crackles or wheezes.  CVS: S1 &S2 heard. No murmur.  Regular rate and rhythm. Abdomen: Soft, nontender, nondistended.  Bowel sounds are heard.  Liver is not palpable, no abdominal mass palpated Extremities: No cyanosis, clubbing or edema.  Peripheral pulses are palpable. Psych: Alert, awake and oriented, normal mood CNS:  No cranial nerve deficits.  Power equal in all extremities.  No sensory deficits noted.  No cerebellar signs.   Skin: Warm and dry.  No rashes noted.   Data Review: I have personally reviewed the following laboratory data and studies,  CBC: Recent Labs  Lab 09/20/18 0728 09/21/18 0238 09/22/18 0254 09/23/18 0511  WBC 10.3 11.9* 10.9* 11.8*  NEUTROABS 7.0 8.3*  --   --   HGB 9.5* 8.6* 9.2* 9.1*  HCT 30.0* 27.6* 30.3* 29.0*  MCV 96.8 98.9 103.4* 98.6  PLT 328 365 382 540*   Basic Metabolic Panel: Recent Labs  Lab 09/20/18 0728 09/21/18 0238 09/22/18 0254 09/23/18 0511 09/24/18 0529 09/25/18 0429  NA 136 137 134* 133* 132*  --   K 3.7 3.9 4.1 4.0 4.3  --   CL 109 108 102 101 98  --     CO2 23 24 25 25 26   --   GLUCOSE 175* 209* 180* 203* 169*  --   BUN 12 11 9 9 12   --   CREATININE 0.47* 0.42* 0.41* 0.49* 0.60*  --   CALCIUM 9.7 9.5 9.9 10.2 10.5*  --   MG 1.6* 1.7 1.8 1.5* 1.5* 1.6*   Liver Function Tests: No results for input(s): AST, ALT, ALKPHOS, BILITOT, PROT, ALBUMIN in the last 168 hours. No results for input(s): LIPASE, AMYLASE in the last 168 hours. No results for input(s): AMMONIA in the last 168 hours. Cardiac Enzymes: No results for input(s): CKTOTAL, CKMB, CKMBINDEX, TROPONINI in the last 168 hours. BNP (last 3 results) Recent Labs    07/17/18 1243  BNP 268.7*    ProBNP (last 3 results) No  results for input(s): PROBNP in the last 8760 hours.  CBG: Recent Labs  Lab 09/25/18 1126 09/25/18 1729 09/25/18 2143 09/26/18 0745 09/26/18 1214  GLUCAP 259* 319* 236* 188* 263*   Recent Results (from the past 240 hour(s))  Culture, blood (Routine x 2)     Status: Abnormal   Collection Time: 09/17/18 11:28 AM  Result Value Ref Range Status   Specimen Description   Final    BLOOD BLOOD RIGHT FOREARM Performed at [facility] [address] [place], [place] [address]    Special Requests   Final    BOTTLES DRAWN AEROBIC AND ANAEROBIC Blood Culture adequate volume Performed at [facility] [address] [place], [place] [address]    Culture  Setup Time   Final    GRAM NEGATIVE RODS IN BOTH AEROBIC AND ANAEROBIC BOTTLES CRITICAL RESULT CALLED TO, READ BACK BY AND VERIFIED WITH: B. GREEN,PHARMD 0425 09/18/2018 Mena Goes Performed at [facility], [place] [address] [place], [place] [address]    Culture PROTEUS MIRABILIS (A)  Final   Report Status 09/20/2018 FINAL  Final   Organism ID, Bacteria PROTEUS MIRABILIS  Final      Susceptibility   Proteus mirabilis - MIC*    AMPICILLIN <=2 SENSITIVE Sensitive     CEFAZOLIN <=4 SENSITIVE Sensitive     CEFEPIME <=1 SENSITIVE Sensitive     CEFTAZIDIME <=1  SENSITIVE Sensitive     CEFTRIAXONE <=1 SENSITIVE Sensitive     CIPROFLOXACIN <=0.25 SENSITIVE Sensitive     GENTAMICIN <=1 SENSITIVE Sensitive     IMIPENEM 2 SENSITIVE Sensitive     TRIMETH/SULFA <=20 SENSITIVE Sensitive     AMPICILLIN/SULBACTAM <=2 SENSITIVE Sensitive     PIP/TAZO <=4 SENSITIVE Sensitive     * PROTEUS MIRABILIS  Blood Culture ID Panel (Reflexed)     Status: Abnormal   Collection Time: 09/17/18 11:28 AM  Result Value Ref Range Status   Enterococcus species NOT DETECTED NOT DETECTED Final   Listeria monocytogenes NOT DETECTED NOT DETECTED Final   Staphylococcus species NOT DETECTED NOT DETECTED Final   Staphylococcus aureus (BCID) NOT DETECTED NOT DETECTED Final   Streptococcus species NOT DETECTED NOT DETECTED Final   Streptococcus agalactiae NOT DETECTED NOT DETECTED Final   Streptococcus pneumoniae NOT DETECTED NOT DETECTED Final   Streptococcus pyogenes NOT DETECTED NOT DETECTED Final   Acinetobacter baumannii NOT DETECTED NOT DETECTED Final   Enterobacteriaceae species DETECTED (A) NOT DETECTED Final    Comment: Enterobacteriaceae represent a large family of gram-negative bacteria, not a single organism. CRITICAL RESULT CALLED TO, READ BACK BY AND VERIFIED WITH: B. GREEN,PHARMD 0425 09/18/2018 T. TYSOR    Enterobacter cloacae complex NOT DETECTED NOT DETECTED Final   Escherichia coli NOT DETECTED NOT DETECTED Final   Klebsiella oxytoca NOT DETECTED NOT DETECTED Final   Klebsiella pneumoniae NOT DETECTED NOT DETECTED Final   Proteus species DETECTED (A) NOT DETECTED Final    Comment: CRITICAL RESULT CALLED TO, READ BACK BY AND VERIFIED WITH: B. GREEN,PHARMD 0425 09/18/2018 T. TYSOR    Serratia marcescens NOT DETECTED NOT DETECTED Final   Carbapenem resistance NOT DETECTED NOT DETECTED Final   Haemophilus influenzae NOT DETECTED NOT DETECTED Final   Neisseria meningitidis NOT DETECTED NOT DETECTED Final   Pseudomonas aeruginosa NOT DETECTED NOT DETECTED Final    Candida albicans NOT DETECTED NOT DETECTED Final   Candida glabrata NOT DETECTED NOT DETECTED Final   Candida krusei NOT DETECTED NOT DETECTED Final  Candida parapsilosis NOT DETECTED NOT DETECTED Final   Candida tropicalis NOT DETECTED NOT DETECTED Final    Comment: Performed at [facility], [place] [address] [place], [place] [address]  Culture, blood (Routine x 2)     Status: Abnormal   Collection Time: 09/17/18 11:34 AM  Result Value Ref Range Status   Specimen Description   Final    BLOOD RIGHT HAND Performed at [facility] [address], [place], [place] [address]    Special Requests   Final    BOTTLES DRAWN AEROBIC AND ANAEROBIC Blood Culture results may not be optimal due to an inadequate volume of blood received in culture bottles Performed at [facility] [address], [place], [place] [address]    Culture  Setup Time   Final    GRAM NEGATIVE RODS IN BOTH AEROBIC AND ANAEROBIC BOTTLES CRITICAL VALUE NOTED.  VALUE IS CONSISTENT WITH PREVIOUSLY REPORTED AND CALLED VALUE.    Culture (A)  Final    PROTEUS MIRABILIS SUSCEPTIBILITIES PERFORMED ON PREVIOUS CULTURE WITHIN THE LAST 5 DAYS. Performed at [facility], [place] [address] [place], [place] [address]    Report Status 09/20/2018 FINAL  Final  Culture, Urine     Status: Abnormal   Collection Time: 09/17/18  6:40 PM  Result Value Ref Range Status   Specimen Description   Final    URINE, CATHETERIZED Performed at [facility] [address] [place], [place] [address]    Special Requests   Final    Normal Performed at [facility], [place] [address], [place], [place] [address]    Culture >=100,000 COLONIES/mL PROTEUS MIRABILIS (A)  Final   Report Status 09/20/2018 FINAL  Final   Organism ID, Bacteria PROTEUS MIRABILIS (A)  Final      Susceptibility   Proteus mirabilis - MIC*    AMPICILLIN <=2 SENSITIVE Sensitive      CEFAZOLIN <=4 SENSITIVE Sensitive     CEFTRIAXONE <=1 SENSITIVE Sensitive     CIPROFLOXACIN <=0.25 SENSITIVE Sensitive     GENTAMICIN <=1 SENSITIVE Sensitive     IMIPENEM 1 SENSITIVE Sensitive     NITROFURANTOIN 128 RESISTANT Resistant     TRIMETH/SULFA <=20 SENSITIVE Sensitive     AMPICILLIN/SULBACTAM <=2 SENSITIVE Sensitive     PIP/TAZO <=4 SENSITIVE Sensitive     * >=100,000 COLONIES/mL PROTEUS MIRABILIS  MRSA PCR Screening     Status: None   Collection Time: 09/17/18  9:27 PM  Result Value Ref Range Status   MRSA by PCR NEGATIVE NEGATIVE Final    Comment:        The GeneXpert MRSA Assay (FDA approved for NASAL specimens only), is one component of a comprehensive MRSA colonization surveillance program. It is not intended to diagnose MRSA infection nor to guide or monitor treatment for MRSA infections. Performed at [facility], [place] [address] [place], [place] [address]      Studies: No results found.  Scheduled Meds: . amLODipine  5 mg Oral QPM  . cromolyn  1 drop Both Eyes QID  . enoxaparin (LOVENOX) injection  40 mg Subcutaneous Q24H  . famotidine  20 mg Oral BID  . feeding supplement (ENSURE ENLIVE)  237 mL Oral TID BM  . insulin aspart  0-9 Units Subcutaneous TID WC  . losartan  25 mg Oral Daily  . magnesium oxide  400 mg Oral BID  . pantoprazole  40 mg Oral Daily  .  tamsulosin  0.4 mg Oral QHS    Continuous Infusions:   Flora Lipps, MD  [facility] 09/26/2018

## 2018-09-26 NOTE — Progress Notes (Signed)
Occupational Therapy Treatment Patient Details Name: Sean Cooke MRN: [id number] DOB: May 13, 1927 Today's Date: 09/26/2018    History of present illness 85 yo male admitted with sepsis, acute encephalopathy. Hx of lung ca, pancreatic ca, DDD, BPH   OT comments  Pt agreeable to OOB but could not get comfortable in chair despite repositioning.  Returned to bed. Pt needed increased, multimodal cues this session  Follow Up Recommendations  Home health OT(plan is ALF now)    Equipment Recommendations  3 in 1 bedside commode    Recommendations for Other Services      Precautions / Restrictions Precautions Precautions: Fall Restrictions Weight Bearing Restrictions: No       Mobility Bed Mobility         Supine to sit: Min assist Sit to supine: Min assist   General bed mobility comments: assist to rise and stabilize. Multimodal cues for directionality  Transfers   Equipment used: Rolling walker (2 wheeled)   Sit to Stand: Min assist;From elevated surface         General transfer comment: Assist to rise, stabilize, control descent.    Balance                                           ADL either performed or assessed with clinical judgement   ADL                           Toilet Transfer: Minimal assistance/moderate;RW(simulated, chair).  More assistance to chair than back to bed.  Helped turn walker to assist with directionality as pt was turning away from chair             General ADL Comments: transferred to chair, stood again for repositioning then transferred back to bed as pt requested this.  Could not get comfortable despite pillows under buttocks and behind back     Vision       Perception     Praxis      Cognition Arousal/Alertness: Awake/alert Behavior During Therapy: WFL for tasks assessed/performed Overall Cognitive Status: Impaired/Different from baseline                                  General Comments: pt needed multimodal cues for directionality during transfers        Exercises     Shoulder Instructions       General Comments      Pertinent Vitals/ Pain       Faces Pain Scale: Hurts even more Pain Location: all over with movement Pain Descriptors / Indicators: Aching Pain Intervention(s): Limited activity within patient's tolerance;Monitored during session;Repositioned  Home Living                                          Prior Functioning/Environment              Frequency  Min 2X/week        Progress Toward Goals  OT Goals(current goals can now be found in the care plan section)  Progress towards OT goals: Progressing toward goals(slowly, needed more cues today)     Plan      Co-evaluation  AM-PAC OT "6 Clicks" Daily Activity     Outcome Measure   Help from another person eating meals?: None Help from another person taking care of personal grooming?: A Little Help from another person toileting, which includes using toliet, bedpan, or urinal?: A Lot Help from another person bathing (including washing, rinsing, drying)?: A Lot Help from another person to put on and taking off regular upper body clothing?: A Little Help from another person to put on and taking off regular lower body clothing?: A Lot 6 Click Score: 16    End of Session    OT Visit Diagnosis: Muscle weakness (generalized) (M62.81)   Activity Tolerance Patient limited by pain   Patient Left in bed;with call bell/phone within reach;with bed alarm set   Nurse Communication          Time: 0383-3383 OT Time Calculation (min): 24 min  Charges: OT General Charges $OT Visit: 1 Visit OT Treatments $Therapeutic Activity: 23-37 mins  Lesle Chris, OTR/L Acute Rehabilitation Services [phone] [facility] pager [phone] office 09/26/2018   [place] 09/26/2018, 1:54 PM

## 2018-09-26 NOTE — Progress Notes (Signed)
PT Cancellation Note  Patient Details Name: Sean Cooke MRN: [id number] DOB: [birth year]   Cancelled Treatment:    Reason Eval/Treat Not Completed: Patient declined, no reason specified Pt declined to participate.  Pt did work with OT earlier per notes.  Pt reports abdominal discomfort today.   Janaysia Mcleroy,KATHrine E 09/26/2018, 2:37 PM [employer], DPT Acute Rehabilitation Services Office: [phone] Pager: [phone]

## 2018-09-26 NOTE — Progress Notes (Signed)
Nutrition Follow-up  DOCUMENTATION CODES:   Severe malnutrition in context of chronic illness  INTERVENTION:    Ensure Enlive po BID, each supplement provides 350 kcal and 20 grams of protein  Magic cup TID with meals, each supplement provides 290 kcal and 9 grams of protein  NUTRITION DIAGNOSIS:   Severe Malnutrition related to chronic illness, cancer and cancer related treatments as evidenced by energy intake < or equal to 75% for > or equal to 1 month, percent weight loss, moderate fat depletion, severe muscle depletion.  Ongoing  GOAL:   Patient will meet greater than or equal to 90% of their needs  Not meeting  MONITOR:   PO intake, Supplement acceptance, Weight trends, Labs, I & O's, Skin  REASON FOR ASSESSMENT:   (PI report)    ASSESSMENT:   Patient with PMH significant for GERD, HTN, CHF, prior stage III lung cancer, and newly diagnosed stage I pancreatic cancer s/p radiation. Recently hospitalized through 08/02/2017 with scrotal cellulitis and need an indwelling Foley catheter. Presents this admission with sepsis secondary to UTI.    2/5- full liquid diet 2/6- DYS 3 diet  No family at bedside. Meal completions charted as 25-100% for pt's last eight meals. Tech reports pt typically eats 25% of his meal but finishes his magic cups and Ensure. RD observed Ensure at bedside 75% completed.   A weight has not been obtained since 2/4.   Plan for pt to d/c to ALF.   Medications reviewed and include: SS novolog, Mag-oxide Labs reviewed: Mg 1.6 (L) CBG [id number]  Diet Order:   Diet Order            Diet general        DIET DYS 3 Room service appropriate? Yes; Fluid consistency: Thin  Diet effective now              EDUCATION NEEDS:   Education needs have been addressed  Skin:  Skin Assessment: Skin Integrity Issues: Skin Integrity Issues:: Stage II Stage II: sacrum  Last BM:  2/9  Height:   Ht Readings from Last 1 Encounters:  09/17/18 5\' 7"   (1.702 m)    Weight:   Wt Readings from Last 1 Encounters:  09/17/18 66.2 kg    Ideal Body Weight:  67.3 kg  BMI:  Body mass index is 22.86 kg/m.  Estimated Nutritional Needs:   Kcal:  1950-2150 kcal  Protein:  95-110 grams  Fluid:  >/= 1.9 L/day   Mariana Single RD, LDN Clinical Nutrition Pager # - [phone]

## 2018-09-27 DIAGNOSIS — L89152 Pressure ulcer of sacral region, stage 2: Secondary | ICD-10-CM

## 2018-09-27 LAB — BASIC METABOLIC PANEL
Anion gap: 7 (ref 5–15)
BUN: 19 mg/dL (ref 8–23)
CO2: 27 mmol/L (ref 22–32)
Calcium: 11.3 mg/dL — ABNORMAL HIGH (ref 8.9–10.3)
Chloride: 99 mmol/L (ref 98–111)
Creatinine, Ser: 0.62 mg/dL (ref 0.61–1.24)
GFR calc Af Amer: >60 mL/min (ref >60)
GFR calc non Af Amer: >60 mL/min (ref >60)
Glucose, Bld: 173 mg/dL — ABNORMAL HIGH (ref 70–99)
POTASSIUM: 4.1 mmol/L (ref 3.5–5.1)
Sodium: 133 mmol/L — ABNORMAL LOW (ref 135–145)

## 2018-09-27 LAB — CBC
HCT: 26.6 % — ABNORMAL LOW (ref 39.0–52.0)
HEMOGLOBIN: 8.4 g/dL — AB (ref 13.0–17.0)
MCH: 31.2 pg (ref 26.0–34.0)
MCHC: 31.6 g/dL (ref 30.0–36.0)
MCV: 98.9 fL (ref 80.0–100.0)
Platelets: 459 10*3/uL — ABNORMAL HIGH (ref 150–400)
RBC: 2.69 MIL/uL — AB (ref 4.22–5.81)
RDW: 16.5 % — ABNORMAL HIGH (ref 11.5–15.5)
WBC: 10.8 10*3/uL — ABNORMAL HIGH (ref 4.0–10.5)
nRBC: 0.2 % (ref 0.0–0.2)

## 2018-09-27 LAB — GLUCOSE, CAPILLARY
Glucose-Capillary: 166 mg/dL — ABNORMAL HIGH (ref 70–99)
Glucose-Capillary: 230 mg/dL — ABNORMAL HIGH (ref 70–99)
Glucose-Capillary: 307 mg/dL — ABNORMAL HIGH (ref 70–99)

## 2018-09-27 MED ORDER — LOSARTAN POTASSIUM 25 MG PO TABS: 50.0000 mg | ORAL_TABLET | Freq: Every morning | ORAL | Status: AC

## 2018-09-27 MED ORDER — IBUPROFEN 400 MG PO TABS: 400.0000 mg | ORAL_TABLET | Freq: Four times a day (QID) | ORAL | Status: AC | PRN

## 2018-09-27 NOTE — Progress Notes (Signed)
    Durable Medical Equipment  (From admission, onward)         Start     Ordered   09/27/18 1153  For home use only DME Hospital bed  Once    Question Answer Comment  The above medical condition requires: Patient requires the ability to reposition frequently   Bed type Semi-electric      09/27/18 1152

## 2018-09-27 NOTE — Progress Notes (Signed)
Patient is stable for discharge. Discharge instructions and medications have been reviewed with the patient and all questions answered. AVS and prescriptions given to [facility] staff. Report given to staff member Ebony Hail at [facility], also spoke with [employer]. Per Ebony Hail and [employer] there was no Radio broadcast assistant available to take report. Waiting on [facility] to transport the patient.   Samary Shatz, Fraser Din 09/27/2018 5:16 PM

## 2018-09-27 NOTE — Care Management Note (Signed)
Case Management Note  Patient Details  Name: Sean Cooke MRN: [id number] Date of Birth: [birth year]  Subjective/Objective:       Requested [place] [facility] services at ALF. [place] and they have [place] [facility] provide services at their facility.              Action/Plan: [facility] and they are aware of referral. Requested hospital bed, Contacted [facility] for delivery.   Expected Discharge Date:  09/27/18               Expected Discharge Plan:  Assisted Living / Rest Home  In-House Referral:  Clinical Social Work  Discharge planning Services  CM Consult  Post Acute Care Choice:  Home Health Choice offered to:  Adult Children  DME Arranged:  N/A DME Agency:  NA  HH Arranged:  PT HH Agency:  NA  Status of Service:  Completed, signed off  If discussed at [place] of Stay Meetings, dates discussed:    Additional Comments:  Guadalupe Maple, RN 09/27/2018, 2:15 PM

## 2018-09-27 NOTE — Discharge Summary (Addendum)
Physician Discharge Summary  Sean Cooke QQP:[id number] DOB: 05-16-1927 DOA: 09/17/2018  PCP: [place] date: 09/17/2018   Discharge date: 09/27/2018  Admitted From: Home  Discharge disposition:  ALF   Recommendations for Outpatient Follow-Up:   1. Follow-up with primary care provider at the ALF in 3 to 5 days. 2.   Patient will need a basic metabolic profile done, magnesium, CBC done in 1 week. 3. Outpatient follow-up with Dr. Lisbeth Renshaw, radiation oncology as scheduled.  Discharge Diagnosis:   Principal Problem:   Sepsis ([place]) Active Problems:   BPH (benign prostatic hyperplasia)   Malignant neoplasm of right upper lobe of lung (HCC)   Pressure injury of skin, sacral ulcer Stage II on presentation   Bacteremia: Probable   Acute lower UTI   Type 2 diabetes mellitus without complication (HCC)   Protein-calorie malnutrition, severe   Fever   Bacterial infection due to Proteus mirabilis   Urinary tract infection due to Proteus   Discharge Condition: Improved.  Diet recommendation: Diabetic  Code status: Full.   History of Present Illness:   Patient is a 85 year old resident of [facility] usually alert awake and oriented, prior history of adenocarcinoma stage III of the lung, newly diagnosed stage I T2N0 pancreatic cancer status post XRT 08/19/2018, supposed to return to oncology clinic 09/19/2018, history of BPH, diastolic heart failure, recent hospitalization 07/21/2018 for scrotal cellulitis and need of indwelling Foley catheter who presented to the ED with confusion, fever 104F and Foley noted to have frank pus around it which was changed at the facility. On admission, patient noted to be septic and was placed empirically on IV antibiotics.  Today, patient denies any dizziness nausea vomiting fever or chills.  Hospital Course:   Patient was admitted to hospital in following conditions were addressed during  hospitalization,  Sepsis secondary to Proteus bacteremia and UTI Blood cultures and urine cultures were positive for Proteus.  Patient initially received IV Rocephin and was continued on oral Vantin to complete the course.  Patient has completed a course of antibiotic at this time.  Acute metabolic encephalopathy secondary to sepsis and UTI.  Improved.  He is at his baseline at this time   Essential hypertension.  Continue Cozaar and Norvasc.  Closely monitor.  Patient remained stable.  Dose of Cozaar was decreased in the hospital  Depression and insomnia.  Continue trazodone  History of non-small cell lung cancer stage IIIb status post chemotherapy in 2016 Will need outpatient follow-up with oncology.  Recent diagnosis of pancreatic cancer status post XRT Follow-up as outpatient oncology.  Diabetes mellitus type 2   Dysphagia 3 diet.  Last hemoglobin A1c of 6.1.  Received sliding scale insulin while in the hospital.  Anemia likely of chronic disease. No need for blood transfusion.    Hypokalemia/hypomagnesemia Improved with replacement.   Check labs in 1 week.  Disposition.  At this time, patient is stable for disposition to assisted living facility.  Patient will need home health with physical therapy on discharge including a hospital bed to assist with his debility deconditioning.   Medical Consultants:    None.   Discharge Exam:   Vitals:   09/27/18 0533 09/27/18 1100  BP: 140/86 135/81  Pulse: 80 83  Resp: 20 (!) 24  Temp: (!) 97.5 F (36.4 C) 98.3 F (36.8 C)  SpO2: 94% 97%   Vitals:   09/26/18 1700 09/26/18 2201 09/27/18 0533 09/27/18 1100  BP: 125/69 125/64  140/86 135/81  Pulse:  91 80 83  Resp:  20 20 (!) 24  Temp:  98.7 F (37.1 C) (!) 97.5 F (36.4 C) 98.3 F (36.8 C)  TempSrc:  Oral Oral Oral  SpO2:  95% 94% 97%  Weight:      Height:       General:  Average built, not in obvious distress.  Thinly built HENT: Normocephalic, pupils  equally reacting to light and accommodation.  No scleral pallor or icterus noted. Oral mucosa is moist.  Chest:  Clear breath sounds.  Diminished breath sounds bilaterally. No crackles or wheezes.  CVS: S1 &S2 heard. No murmur.  Regular rate and rhythm. Abdomen: Soft, nontender, nondistended.  Bowel sounds are heard.  Liver is not palpable, no abdominal mass palpated Extremities: No cyanosis, clubbing or edema.  Peripheral pulses are palpable. Psych: Alert, awake and communicative, normal mood CNS:  No cranial nerve deficits.  Power equal in all extremities.  No sensory deficits noted.  No cerebellar signs.   Skin: Warm and dry.  No rashes noted.    The results of significant diagnostics from this hospitalization (including imaging, microbiology, ancillary and laboratory) are listed below for reference.     Procedures and Diagnostic Studies:   Dg Chest 2 View  Result Date: 09/17/2018 CLINICAL DATA:  Possible sepsis EXAM: CHEST - 2 VIEW COMPARISON:  07/17/2018 FINDINGS: Cardiac shadows within normal limits. Right chest wall port is noted in satisfactory position. The lungs are well aerated bilaterally. No focal infiltrate or sizable effusion is seen. No acute bony abnormality is noted. IMPRESSION: No active cardiopulmonary disease. Electronically Signed   By: Inez Catalina M.D.   On: 09/17/2018 12:03   Ct Head Wo Contrast  Result Date: 09/17/2018 CLINICAL DATA:  Altered level of consciousness EXAM: CT HEAD WITHOUT CONTRAST TECHNIQUE: Contiguous axial images were obtained from the base of the skull through the vertex without intravenous contrast. COMPARISON:  July 17, 2018 FINDINGS: Brain: There is mild diffuse atrophy. There is no intracranial mass, hemorrhage, extra-axial fluid collection, or midline shift. There is patchy small vessel disease in the centra semiovale bilaterally. No acute infarct is demonstrable on this study. Vascular: There is no hyperdense vessel. There is calcification in  each distal vertebral artery and carotid siphon region. Skull: The bony calvarium appears intact. Sinuses/Orbits: There is mucosal thickening in each medial maxillary antrum. There is opacification and mucosal thickening in several ethmoid air cells. Orbits appear symmetric bilaterally. Other: Mastoid air cells are clear. IMPRESSION: Stable atrophy with periventricular small vessel disease. No acute infarct evident. No mass or hemorrhage. There are foci of arterial vascular calcification. There are foci of paranasal sinus disease. Electronically Signed   By: Lowella Grip III M.D.   On: 09/17/2018 13:53    Labs:   Basic Metabolic Panel: Recent Labs  Lab 09/21/18 0238 09/22/18 0254 09/23/18 0511 09/24/18 0529 09/25/18 0429 09/27/18 0407  NA 137 134* 133* 132*  --  133*  K 3.9 4.1 4.0 4.3  --  4.1  CL 108 102 101 98  --  99  CO2 24 25 25 26   --  27  GLUCOSE 209* 180* 203* 169*  --  173*  BUN 11 9 9 12   --  19  CREATININE 0.42* 0.41* 0.49* 0.60*  --  0.62  CALCIUM 9.5 9.9 10.2 10.5*  --  11.3*  MG 1.7 1.8 1.5* 1.5* 1.6*  --    GFR Estimated Creatinine Clearance: 58.5 mL/min (by C-G formula  based on SCr of 0.62 mg/dL). Liver Function Tests: No results for input(s): AST, ALT, ALKPHOS, BILITOT, PROT, ALBUMIN in the last 168 hours. No results for input(s): LIPASE, AMYLASE in the last 168 hours. No results for input(s): AMMONIA in the last 168 hours. Coagulation profile No results for input(s): INR, PROTIME in the last 168 hours.  CBC: Recent Labs  Lab 09/21/18 0238 09/22/18 0254 09/23/18 0511 09/27/18 0407  WBC 11.9* 10.9* 11.8* 10.8*  NEUTROABS 8.3*  --   --   --   HGB 8.6* 9.2* 9.1* 8.4*  HCT 27.6* 30.3* 29.0* 26.6*  MCV 98.9 103.4* 98.6 98.9  PLT 365 382 475* 459*   Cardiac Enzymes: No results for input(s): CKTOTAL, CKMB, CKMBINDEX, TROPONINI in the last 168 hours. BNP: Invalid input(s): POCBNP CBG: Recent Labs  Lab 09/26/18 0745 09/26/18 1214 09/26/18 1652  09/26/18 2202 09/27/18 0747  GLUCAP 188* 263* 225* 257* 166*   D-Dimer No results for input(s): DDIMER in the last 72 hours. Hgb A1c No results for input(s): HGBA1C in the last 72 hours. Lipid Profile No results for input(s): CHOL, HDL, LDLCALC, TRIG, CHOLHDL, LDLDIRECT in the last 72 hours. Thyroid function studies No results for input(s): TSH, T4TOTAL, T3FREE, THYROIDAB in the last 72 hours.  Invalid input(s): FREET3 Anemia work up No results for input(s): VITAMINB12, FOLATE, FERRITIN, TIBC, IRON, RETICCTPCT in the last 72 hours. Microbiology Recent Results (from the past 240 hour(s))  Culture, blood (Routine x 2)     Status: Abnormal   Collection Time: 09/17/18 11:28 AM  Result Value Ref Range Status   Specimen Description   Final    BLOOD BLOOD RIGHT FOREARM Performed at [facility] [address] [place], [place] [address]    Special Requests   Final    BOTTLES DRAWN AEROBIC AND ANAEROBIC Blood Culture adequate volume Performed at [facility] [address], [place], [place] [address]    Culture  Setup Time   Final    GRAM NEGATIVE RODS IN BOTH AEROBIC AND ANAEROBIC BOTTLES CRITICAL RESULT CALLED TO, READ BACK BY AND VERIFIED WITH: B. GREEN,PHARMD 0425 09/18/2018 Mena Goes Performed at [facility], [place] [address], [place],  [address]    Culture PROTEUS MIRABILIS (A)  Final   Report Status 09/20/2018 FINAL  Final   Organism ID, Bacteria PROTEUS MIRABILIS  Final      Susceptibility   Proteus mirabilis - MIC*    AMPICILLIN <=2 SENSITIVE Sensitive     CEFAZOLIN <=4 SENSITIVE Sensitive     CEFEPIME <=1 SENSITIVE Sensitive     CEFTAZIDIME <=1 SENSITIVE Sensitive     CEFTRIAXONE <=1 SENSITIVE Sensitive     CIPROFLOXACIN <=0.25 SENSITIVE Sensitive     GENTAMICIN <=1 SENSITIVE Sensitive     IMIPENEM 2 SENSITIVE Sensitive     TRIMETH/SULFA <=20 SENSITIVE Sensitive     AMPICILLIN/SULBACTAM <=2 SENSITIVE Sensitive      PIP/TAZO <=4 SENSITIVE Sensitive     * PROTEUS MIRABILIS  Blood Culture ID Panel (Reflexed)     Status: Abnormal   Collection Time: 09/17/18 11:28 AM  Result Value Ref Range Status   Enterococcus species NOT DETECTED NOT DETECTED Final   Listeria monocytogenes NOT DETECTED NOT DETECTED Final   Staphylococcus species NOT DETECTED NOT DETECTED Final   Staphylococcus aureus (BCID) NOT DETECTED NOT DETECTED Final   Streptococcus species NOT DETECTED NOT DETECTED Final   Streptococcus agalactiae NOT DETECTED NOT DETECTED Final   Streptococcus pneumoniae NOT  DETECTED NOT DETECTED Final   Streptococcus pyogenes NOT DETECTED NOT DETECTED Final   Acinetobacter baumannii NOT DETECTED NOT DETECTED Final   Enterobacteriaceae species DETECTED (A) NOT DETECTED Final    Comment: Enterobacteriaceae represent a large family of gram-negative bacteria, not a single organism. CRITICAL RESULT CALLED TO, READ BACK BY AND VERIFIED WITH: B. GREEN,PHARMD 0425 09/18/2018 T. TYSOR    Enterobacter cloacae complex NOT DETECTED NOT DETECTED Final   Escherichia coli NOT DETECTED NOT DETECTED Final   Klebsiella oxytoca NOT DETECTED NOT DETECTED Final   Klebsiella pneumoniae NOT DETECTED NOT DETECTED Final   Proteus species DETECTED (A) NOT DETECTED Final    Comment: CRITICAL RESULT CALLED TO, READ BACK BY AND VERIFIED WITH: B. GREEN,PHARMD 0425 09/18/2018 T. TYSOR    Serratia marcescens NOT DETECTED NOT DETECTED Final   Carbapenem resistance NOT DETECTED NOT DETECTED Final   Haemophilus influenzae NOT DETECTED NOT DETECTED Final   Neisseria meningitidis NOT DETECTED NOT DETECTED Final   Pseudomonas aeruginosa NOT DETECTED NOT DETECTED Final   Candida albicans NOT DETECTED NOT DETECTED Final   Candida glabrata NOT DETECTED NOT DETECTED Final   Candida krusei NOT DETECTED NOT DETECTED Final   Candida parapsilosis NOT DETECTED NOT DETECTED Final   Candida tropicalis NOT DETECTED NOT DETECTED Final    Comment:  Performed at [facility], [place] [address], [place], [place] [address]  Culture, blood (Routine x 2)     Status: Abnormal   Collection Time: 09/17/18 11:34 AM  Result Value Ref Range Status   Specimen Description   Final    BLOOD RIGHT HAND Performed at [facility] [address], [place], [place] [address]    Special Requests   Final    BOTTLES DRAWN AEROBIC AND ANAEROBIC Blood Culture results may not be optimal due to an inadequate volume of blood received in culture bottles Performed at [facility] [address], [place], [place] [address]    Culture  Setup Time   Final    GRAM NEGATIVE RODS IN BOTH AEROBIC AND ANAEROBIC BOTTLES CRITICAL VALUE NOTED.  VALUE IS CONSISTENT WITH PREVIOUSLY REPORTED AND CALLED VALUE.    Culture (A)  Final    PROTEUS MIRABILIS SUSCEPTIBILITIES PERFORMED ON PREVIOUS CULTURE WITHIN THE LAST 5 DAYS. Performed at [facility], [place] [address] [place], [place] [address]    Report Status 09/20/2018 FINAL  Final  Culture, Urine     Status: Abnormal   Collection Time: 09/17/18  6:40 PM  Result Value Ref Range Status   Specimen Description   Final    URINE, CATHETERIZED Performed at [facility] [address], [place], [place] [address]    Special Requests   Final    Normal Performed at [facility], [place] [address] [place], [place] [address]    Culture >=100,000 COLONIES/mL PROTEUS MIRABILIS (A)  Final   Report Status 09/20/2018 FINAL  Final   Organism ID, Bacteria PROTEUS MIRABILIS (A)  Final      Susceptibility   Proteus mirabilis - MIC*    AMPICILLIN <=2 SENSITIVE Sensitive     CEFAZOLIN <=4 SENSITIVE Sensitive     CEFTRIAXONE <=1 SENSITIVE Sensitive     CIPROFLOXACIN <=0.25 SENSITIVE Sensitive     GENTAMICIN <=1 SENSITIVE Sensitive     IMIPENEM 1 SENSITIVE Sensitive     NITROFURANTOIN 128 RESISTANT Resistant     TRIMETH/SULFA <=20 SENSITIVE Sensitive      AMPICILLIN/SULBACTAM <=2  SENSITIVE Sensitive     PIP/TAZO <=4 SENSITIVE Sensitive     * >=100,000 COLONIES/mL PROTEUS MIRABILIS  MRSA PCR Screening     Status: None   Collection Time: 09/17/18  9:27 PM  Result Value Ref Range Status   MRSA by PCR NEGATIVE NEGATIVE Final    Comment:        The GeneXpert MRSA Assay (FDA approved for NASAL specimens only), is one component of a comprehensive MRSA colonization surveillance program. It is not intended to diagnose MRSA infection nor to guide or monitor treatment for MRSA infections. Performed at [facility], [place] [address], [place], [place] [address]      Discharge Instructions:   Discharge Instructions    Diet - low sodium heart healthy   Complete by:  As directed    Diet general   Complete by:  As directed    Dysphagia 3 diet with thin liquids.  Aspiration precautions.   Discharge instructions   Complete by:  As directed    Follow-up with your primary care provider at the skilled nursing facility in 1 week.   Increase activity slowly   Complete by:  As directed    Increase activity slowly   Complete by:  As directed      Allergies as of 09/27/2018      Reactions   Benzonatate Other (See Comments)   Dizziness   Lyrica [pregabalin] Diarrhea   Gabapentin Other (See Comments)   Constipation    Tizanidine Other (See Comments)   Unknown reaction - unable to remember   Penicillins Rash   Has patient had a PCN reaction causing immediate rash, facial/tongue/throat swelling, SOB or lightheadedness with hypotension: unknown Has patient had a PCN reaction causing severe rash involving mucus membranes or skin necrosis: unknown Has patient had a PCN reaction that required hospitalization : unknown Has patient had a PCN reaction occurring within the last 10 years: no, childhood allergy If all of the above answers are "NO", then may proceed with Cephalosporin use.   Tramadol Other (See Comments)    Constipation       Medication List    STOP taking these medications   guaiFENesin 600 MG 12 hr tablet Commonly known as:  MUCINEX   Lidocaine (Anorectal) 5 % Crea     TAKE these medications   ALIGN 4 MG Caps Take 4 mg by mouth daily.   amLODipine 5 MG tablet Commonly known as:  NORVASC Take 5 mg by mouth daily.   antiseptic oral rinse Liqd 15 mLs by Mouth Rinse route every 4 (four) hours as needed for dry mouth.   aspirin 81 MG tablet Take 81 mg by mouth at bedtime.   bisacodyl 5 MG EC tablet Commonly known as:  DULCOLAX Take 5 mg by mouth daily as needed for moderate constipation.   calcium-vitamin D 500-200 MG-UNIT tablet Take 1 tablet by mouth 2 (two) times daily.   cromolyn 4 % ophthalmic solution Commonly known as:  OPTICROM Place 1 drop into both eyes 4 (four) times daily.   famotidine 20 MG tablet Commonly known as:  PEPCID Take 20 mg by mouth 2 (two) times daily.   feeding supplement (PRO-STAT SUGAR FREE 64) Liqd Take 30 mLs by mouth 2 (two) times daily.   Fish Oil 1000 MG Caps Take 1,000 mg by mouth daily.   HUMALOG 100 UNIT/ML injection Generic drug:  insulin lispro Inject 2-4 Units into the skin 2 (two) times daily. Per sliding scale, if  181-300=2 units, 301+=4 units   ibuprofen 400 MG tablet Commonly known as:  ADVIL,MOTRIN Take 1 tablet (400 mg total) by mouth every 6 (six) hours as needed for headache or moderate pain. What changed:    medication strength  how much to take  when to take this  reasons to take this   lidocaine-prilocaine cream Commonly known as:  EMLA Apply 1 application topically as needed. What changed:  reasons to take this   loratadine 10 MG tablet Commonly known as:  CLARITIN Take 10 mg by mouth every morning.   losartan 25 MG tablet Commonly known as:  COZAAR Take 2 tablets (50 mg total) by mouth every morning. What changed:    medication strength  how much to take   magnesium hydroxide 400 MG/5ML  suspension Commonly known as:  MILK OF MAGNESIA Take 30 mLs by mouth at bedtime.   multivitamin tablet Take 1 tablet by mouth daily.   pantoprazole 40 MG tablet Commonly known as:  PROTONIX Take 40 mg by mouth daily.   polyethylene glycol packet Commonly known as:  MIRALAX / GLYCOLAX Take 17 g by mouth every other day.   PROLIA 60 MG/ML Soln injection Generic drug:  denosumab Inject 60 mg into the skin every 6 (six) months. Administer in upper arm, thigh, or abdomen   simethicone 80 MG chewable tablet Commonly known as:  MYLICON Chew 80 mg by mouth every 6 (six) hours as needed for flatulence.   tamsulosin 0.4 MG Caps capsule Commonly known as:  FLOMAX Take 0.4 mg by mouth at bedtime.   traZODone 50 MG tablet Commonly known as:  DESYREL Take 25 mg by mouth at bedtime.   Turmeric 500 MG Tabs Take 500 mg by mouth daily.          Follow-up Information    MD AT SNF Follow up.        Kyung Rudd, MD Follow up.   Specialty:  Radiation Oncology Why:  Follow-up as scheduled. Contact information: [place] [place] [address] [phone]          Time coordinating discharge: 39 minutes  Signed:  Jeramie Cooke  [facility] 09/27/2018, 11:23 AM

## 2018-09-27 NOTE — Progress Notes (Signed)
Patient's clothes and 2 pairs of black glasses placed in patient belongings bag for [facility] to take with patient.

## 2018-09-27 NOTE — Progress Notes (Addendum)
Patient is set to discharge to [facility] today. Patient & spouse, Sean Cooke,  aware. Discharge packet given to RN. [facility] called for transport.   Please call report to [place], [place] Worker [phone]

## 2018-09-27 NOTE — Progress Notes (Signed)
CSW has attempted to contact [facility] multiple times throughout the day at [phone] and [phone] with no response. CSW faxed FL2 and DC summary earlier this AM however has not been able to reach representative, Lilly, to confirm patient is able to go to facility for today.   CSW will continue to update.   Kingsley Spittle, [place]  [phone]

## 2018-10-02 ENCOUNTER — Other Ambulatory Visit: Payer: Self-pay | Admitting: *Deleted

## 2018-10-02 NOTE — Patient Outreach (Signed)
[facility]) Care Management  10/02/2018  Sean Cooke 04/20/1927 [id number]  Visited Sean Cooke and his wife in his room at [facility]. Sean Cooke was able to participate in the conversation. Mrs. Dirosa reviewed what her husband has gone through in the last 6 months. He is 85 years old. He has undergone treatment for lung cancer, discovery of pancreatic cancer and treatment. Has had hospitalizations for cellulitis of his scrotum, sepsis related to UTI. He has development of sacral decubitus, protein calorie malnutrition. He has been dwendellng.  Sean Cooke has been designated as a high risk pt for HTA. There is concern that he may not be at the right level of care. Also, he is currently still a full code.  We discussed these issues and Mrs. Stetson informed me that a representative from a Hospice is coming to talk with them this evening.  Sean Cooke agree at this time he would not want aggressive life saving measures, CPR or intubation. I have encouraged them to put this in witting and advised that the Hospice representative would help them to complete this.  The couple had other family members that came in to visit so I told them I would call them tomorrow. I left a [facility] welcome packet with them.  Eulah Pont. Myrtie Neither, MSN, [facility] Gerontological Nurse Practitioner [facility] Care Management [phone]

## 2018-10-03 ENCOUNTER — Ambulatory Visit: Payer: Self-pay | Admitting: *Deleted

## 2018-10-03 ENCOUNTER — Other Ambulatory Visit: Payer: Self-pay | Admitting: *Deleted

## 2018-10-03 ENCOUNTER — Telehealth: Payer: Self-pay | Admitting: Radiation Oncology

## 2018-10-03 NOTE — Telephone Encounter (Signed)
I called the patient's wife back. She called wanting to cancel her husband's appts because he is in hospice care now.

## 2018-10-03 NOTE — Patient Outreach (Signed)
Talked with Sean Cooke today. She reports [facility] (Hospcie/Palliative care of [place]) will start seeing Sean Cooke for PALLIATIVE CARE. Pt will be moved to a new room on Saturday, closer to the nursing station.  [name] voiced concerns that pt needs to be able to call for help with a call bell or alert. Encouraged for her to talk to staff about that.  Supported pt in her description of events and changes that are happening for them.  Encouraged her to think about hiring a sitter, perhaps for night time, for pt's peace of mind. She will talk to the nurse about availability of sitter list.  Also encouraged her to discuss her husbands comfort  And pain relieve needs with [facility].  I have advised her that he will be in good hands. [facility] is available if they need to ask questions or have additional support.  Eulah Pont. Myrtie Neither, MSN, [facility] Gerontological Nurse Practitioner [facility] Care Management [phone]

## 2018-10-07 ENCOUNTER — Telehealth: Payer: Self-pay | Admitting: Radiation Oncology

## 2018-10-07 NOTE — Telephone Encounter (Signed)
We can arrange an appointment for him to be seen in the next 2-3 weeks.

## 2018-10-07 NOTE — Telephone Encounter (Signed)
The patient's wife called back and asked about whether Sean Cooke needed to be seen. It does not appear that his status has changed as she describes. With that, I told her we would respect the goals of his hospice based care and not plan on any additional follow ups. We can always revisit this discussion as she feels it appropriate. I will pass this along to Dr. Julien Nordmann as well.

## 2018-10-08 ENCOUNTER — Telehealth: Payer: Self-pay | Admitting: Medical Oncology

## 2018-10-08 NOTE — Telephone Encounter (Signed)
LVM to return call and asked him if he wants to schedule a OV with [facility].

## 2018-10-08 NOTE — Telephone Encounter (Signed)
Wife called to say pt is not coming back to see Julien Nordmann and will not need anymore services except what hospice can give."we know his time is short and want to thank Dr Julien Nordmann for everything he did and hospice is making him comfortable".

## 2018-10-14 ENCOUNTER — Ambulatory Visit: Payer: Self-pay | Admitting: Radiation Oncology

## 2018-10-16 ENCOUNTER — Telehealth: Payer: Self-pay | Admitting: Internal Medicine

## 2018-10-16 NOTE — Telephone Encounter (Signed)
Scheduled appt per 3/4 sch message - unable to reach patient - left message with date and time

## 2018-10-16 NOTE — Telephone Encounter (Signed)
Scheduling message sent. 

## 2018-10-28 ENCOUNTER — Ambulatory Visit: Payer: Self-pay | Admitting: Radiation Oncology

## 2018-10-29 ENCOUNTER — Ambulatory Visit: Payer: PPO | Admitting: Internal Medicine

## 2018-11-13 DEATH — deceased

## 2019-11-08 IMAGING — CT CT CHEST W/ CM
2 of 4 series · 15 of 36 positions shown, 18 images · IV contrast (OMNIPAQUE)
Comparison: 10/15/2017

CLINICAL DATA: Right-sided lung cancer diagnosed [DATE]. Last
chemotherapy 1 year ago. Back pain.

EXAM:
CT CHEST WITH CONTRAST
TECHNIQUE: Multidetector CT imaging of the chest was performed during
intravenous contrast administration.
CONTRAST:  75mL OMNIPAQUE IOHEXOL 300 MG/ML  SOLN

[Series 2: axial st · axial · 0.83mm/px · z∈[-387,-101]mm · 12 of 167 slices shown, 15 images]
[im 12/167  mediastinal]
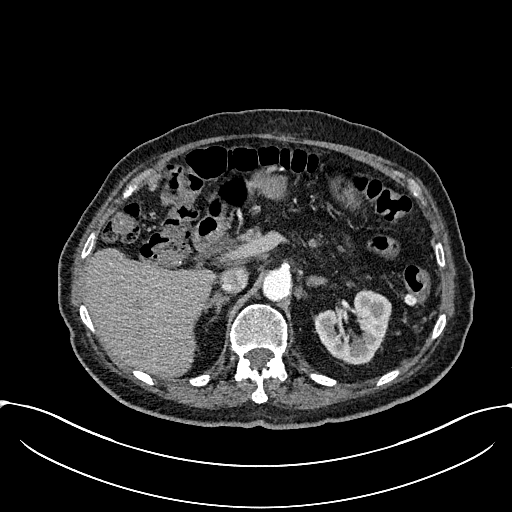
[im 12/167  lung]
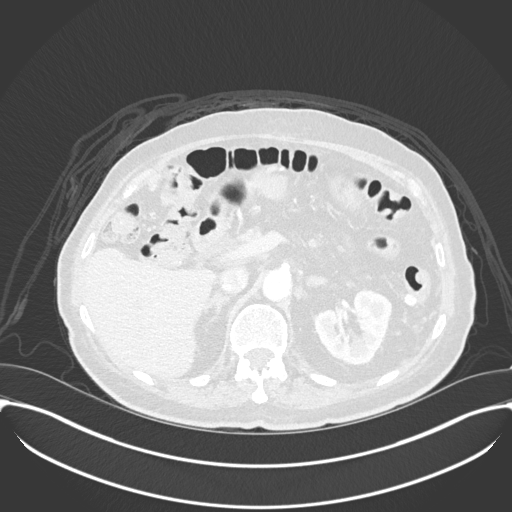
[im 24/167  lung]
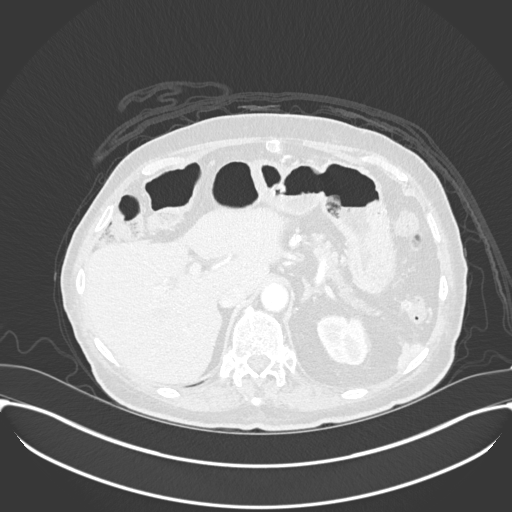
[im 36/167  lung]
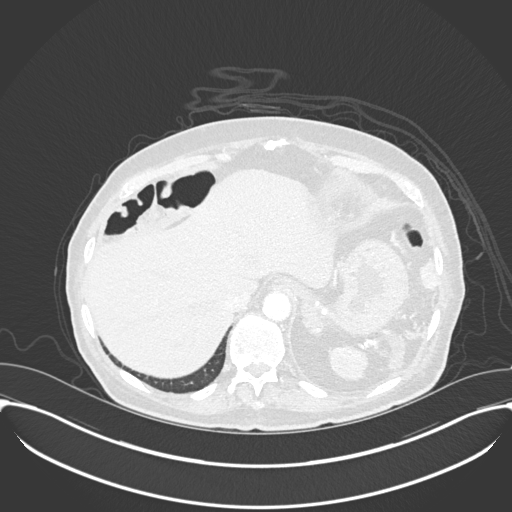
[im 48/167  lung]
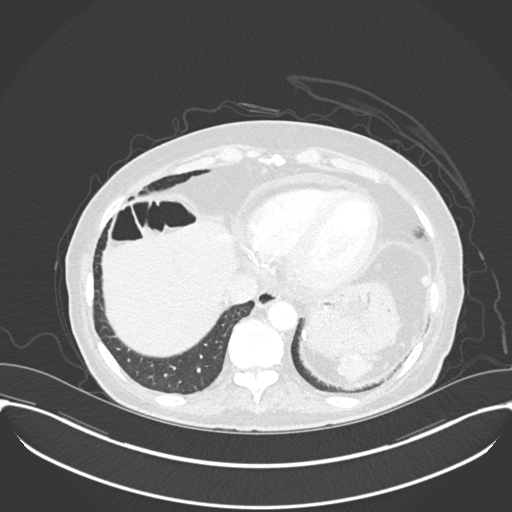
[im 60/167  mediastinal]
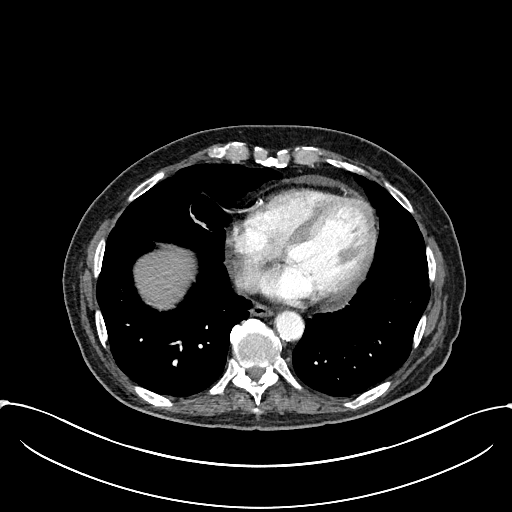
[im 60/167  lung]
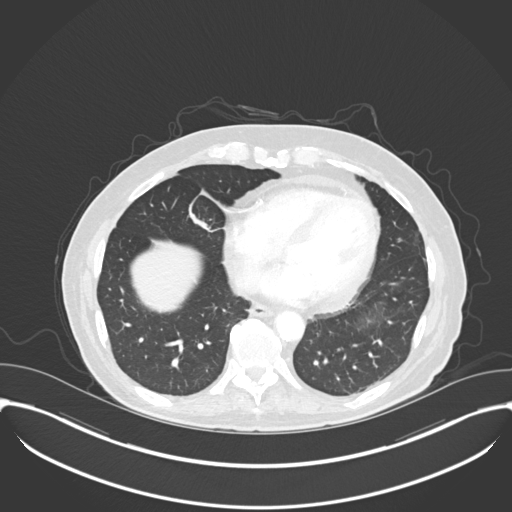
[im 72/167  lung]
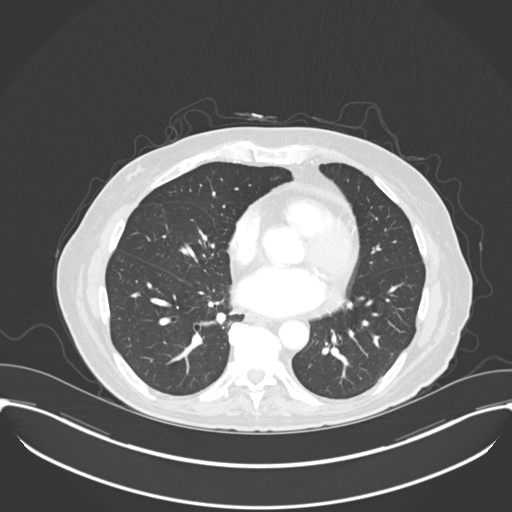
[im 95/167  lung]
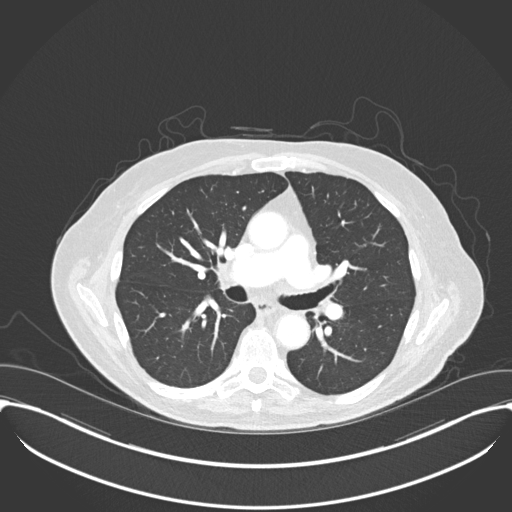
[im 107/167  lung]
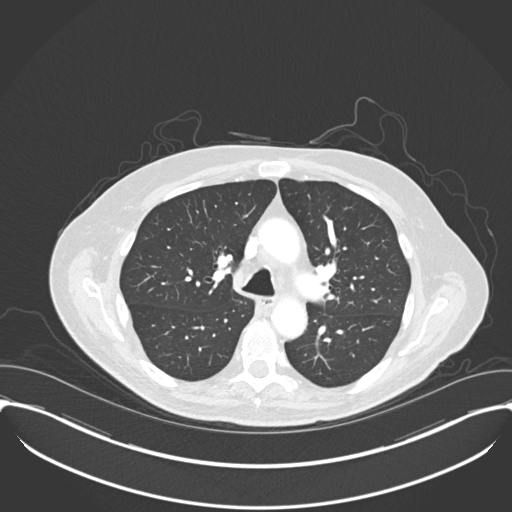
[im 119/167  mediastinal]
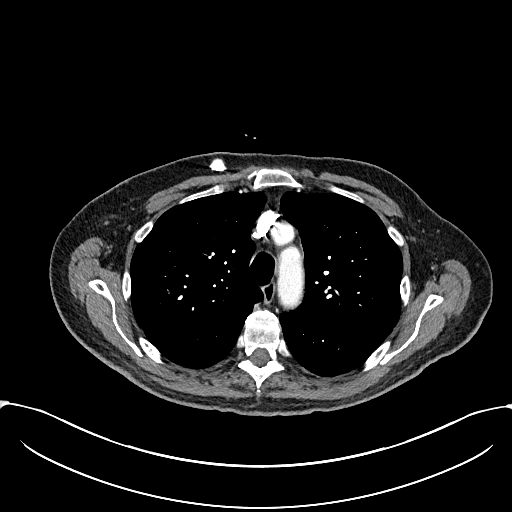
[im 119/167  lung]
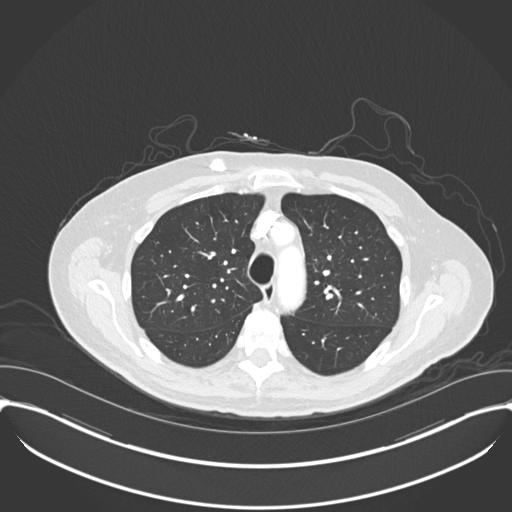
[im 131/167  lung]
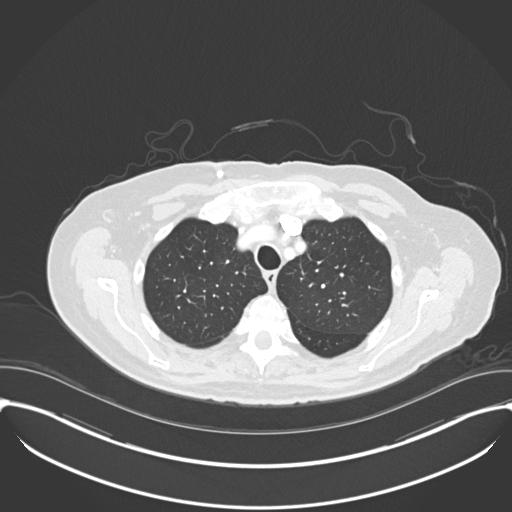
[im 143/167  lung]
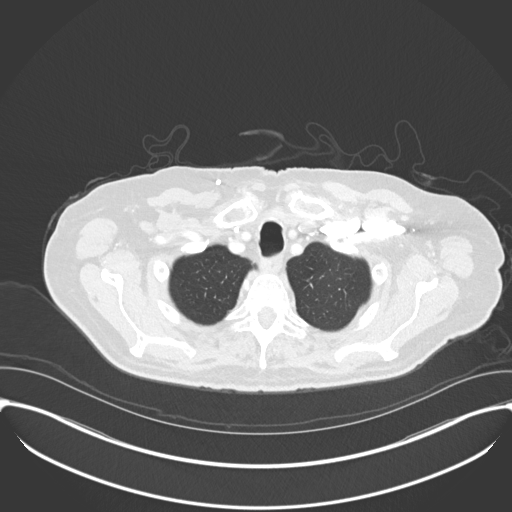
[im 155/167  lung]
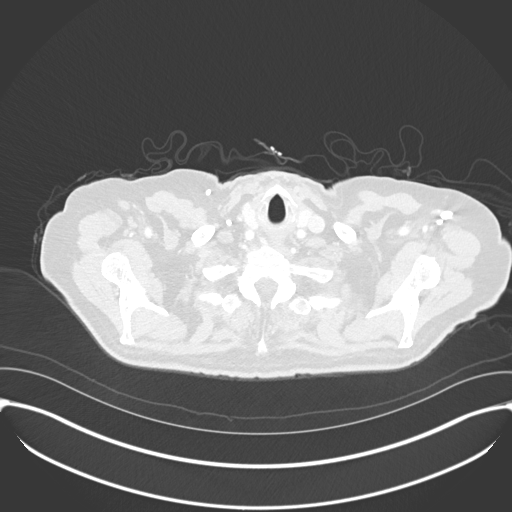

[Series 5: coronal · coronal · 0.73mm/px · 3 of 125 slices shown]
[im 25/125  lung]
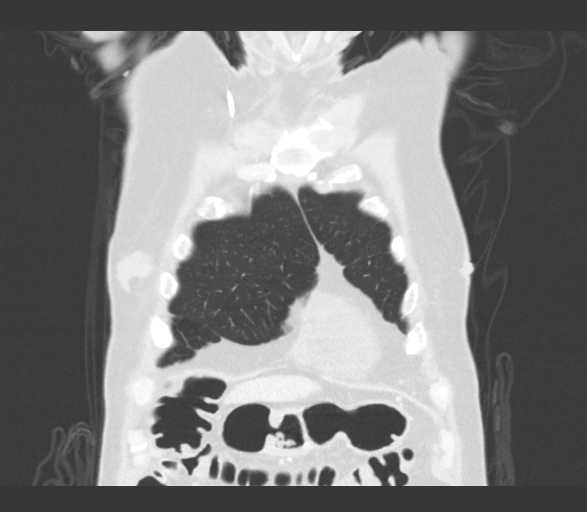
[im 50/125  lung]
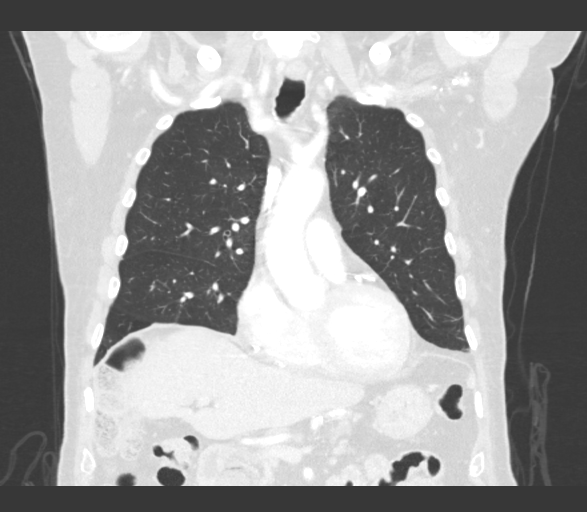
[im 75/125  lung]
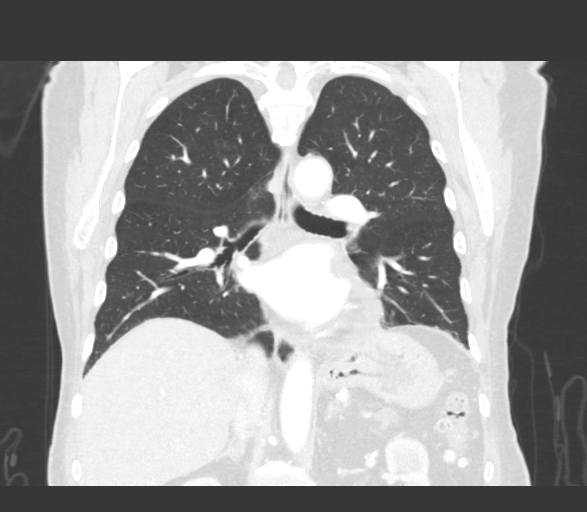

[15 of 36 positions shown; findings below may reference images not displayed]

FINDINGS: Cardiovascular: A right-sided Port-A-Cath which terminates at the
superior caval/atrial junction. Normal heart size with minimal
pericardial fluid or thickening, favored to be physiologic.
Multivessel coronary artery atherosclerosis. No central pulmonary
embolism, on this non-dedicated study. Pulmonary artery enlargement,
with a 3.3 cm right pulmonary artery.

Mediastinum/Nodes: No supraclavicular adenopathy. No mediastinal or
hilar adenopathy.

Lungs/Pleura: No pleural fluid.  Mild centrilobular emphysema.

Similar right middle lobe scarring. Pleural-based right lower lobe 7
mm nodular density including on image 75/7, new since the prior.

A 2 mm lingular nodule is unchanged including on 78/7.

Upper Abdomen: Normal imaged portions of the liver, stomach. Suspect
a periampullary duodenal diverticulum. Splenectomy with left upper
quadrant splenic tissue. Normal adrenal glands and kidneys.
Cholecystectomy.

Pancreatic atrophy again identified. Area of relative soft tissue
fullness at the body/tail junction measures on the order of 2.9 x
2.3 cm on image 150/2. Compare 2.8 x 2.1 cm on the prior exam (when
remeasured). No surrounding inflammation.

Abdominal aortic and branch vessel atherosclerosis.

Musculoskeletal: Right-sided gynecomastia is moderate. No acute
osseous abnormality.
IMPRESSION: 1. Pleural-based right lower lobe pulmonary nodule of 7 mm, new
since the prior. Cannot exclude metastatic disease. Consider CT
follow-up at 3-6 months.
2. Otherwise, no evidence of metastatic disease in the chest.
3. Aortic atherosclerosis (DJXO1-PHO.O), coronary artery
atherosclerosis and emphysema (DJXO1-QPO.F).
4. Pulmonary artery enlargement suggests pulmonary arterial
hypertension.
5. Pancreatic atrophy with an area of persistent body/tail junction
soft tissue fullness. This may be slightly increased. Recommend
attention on follow-up.
6. Moderate right gynecomastia

## 2020-02-08 IMAGING — CT CT CHEST W/ CM
2 of 4 series · 15 of 36 positions shown, 18 images · IV contrast (OMNIPAQUE)
Comparison: 01/14/2018 chest CT.

CLINICAL DATA: Stage IIIB right middle lobe lung adenocarcinoma
diagnosed 2730 status post chemotherapy. Interval observation.
Restaging.

EXAM:
CT CHEST WITH CONTRAST
TECHNIQUE: Multidetector CT imaging of the chest was performed during
intravenous contrast administration.
CONTRAST:  75mL OMNIPAQUE IOHEXOL 300 MG/ML  SOLN

[Series 2: axial st · axial · 0.86mm/px · z∈[+1538,+1834]mm · 12 of 174 slices shown, 15 images]
[im 13/174  mediastinal]
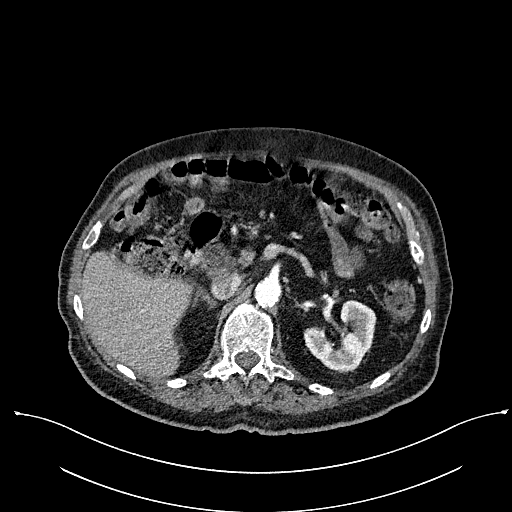
[im 13/174  lung]
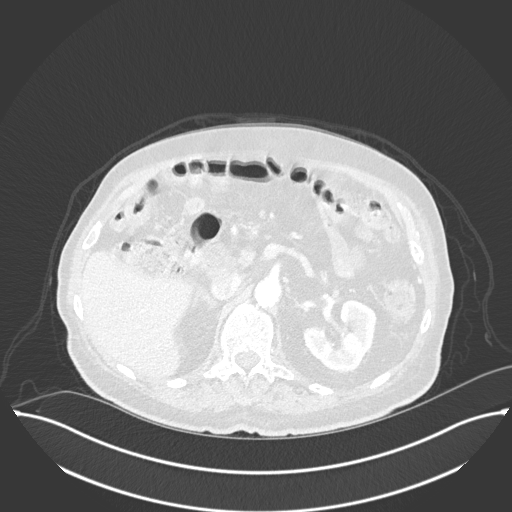
[im 25/174  lung]
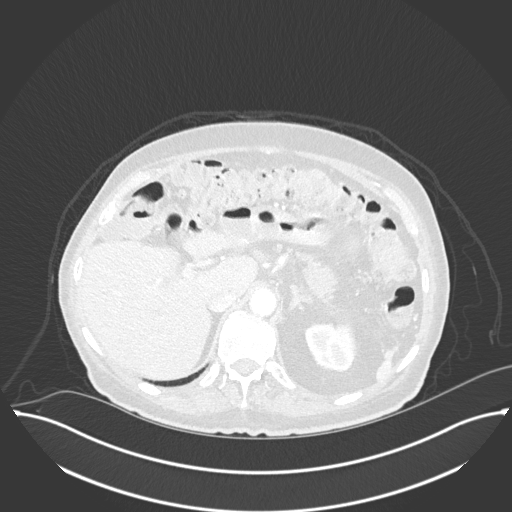
[im 38/174  lung]
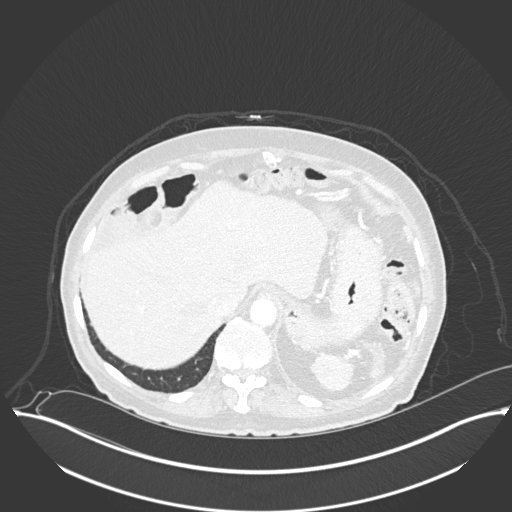
[im 50/174  lung]
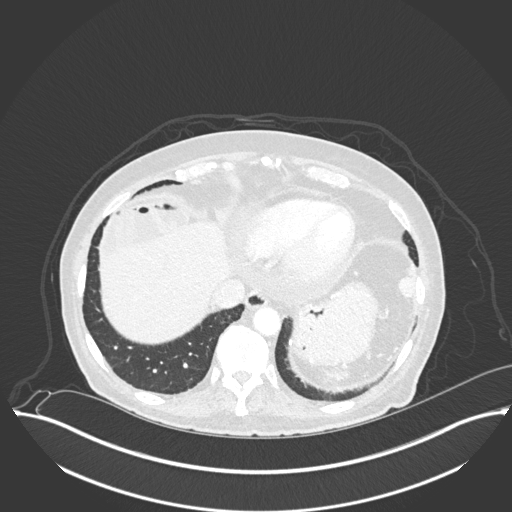
[im 62/174  mediastinal]
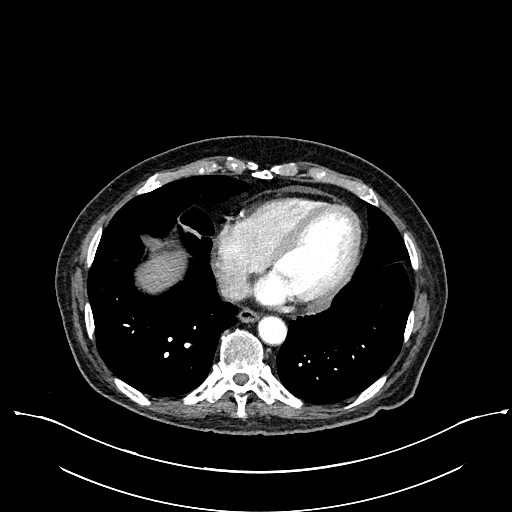
[im 62/174  lung]
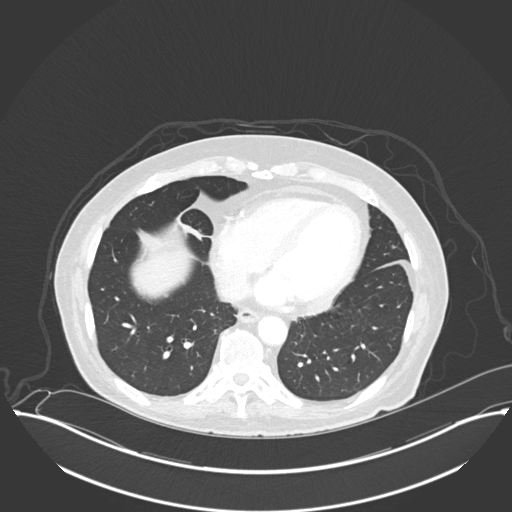
[im 75/174  lung]
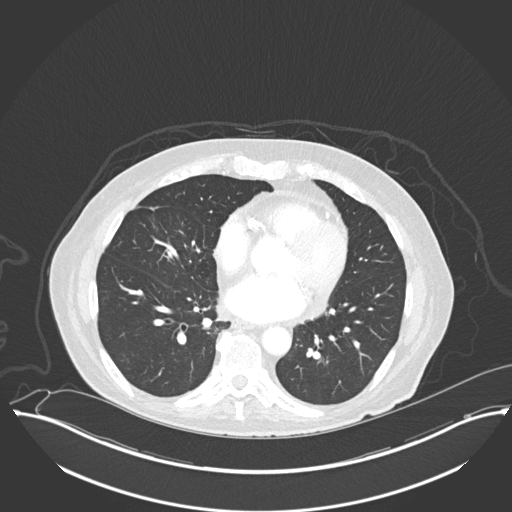
[im 99/174  lung]
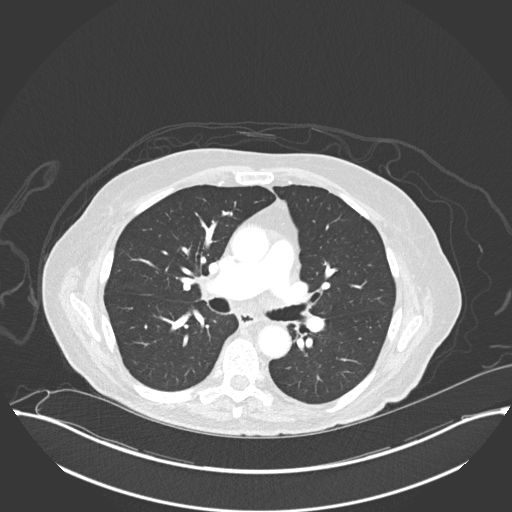
[im 112/174  lung]
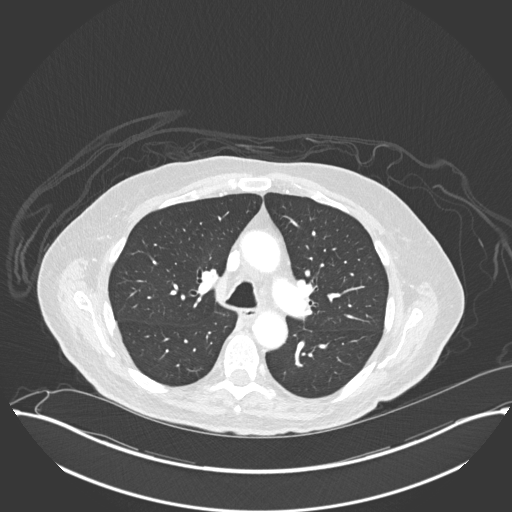
[im 124/174  mediastinal]
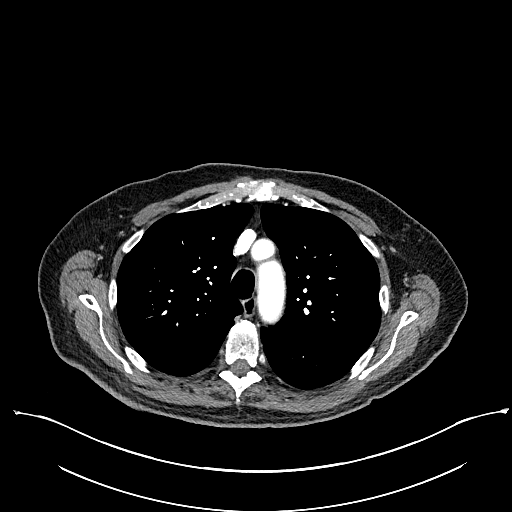
[im 124/174  lung]
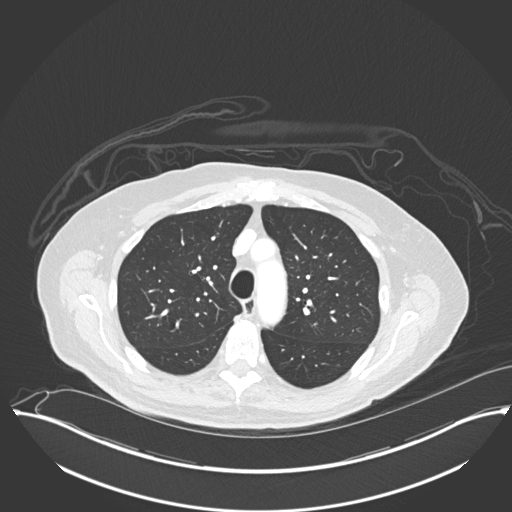
[im 136/174  lung]
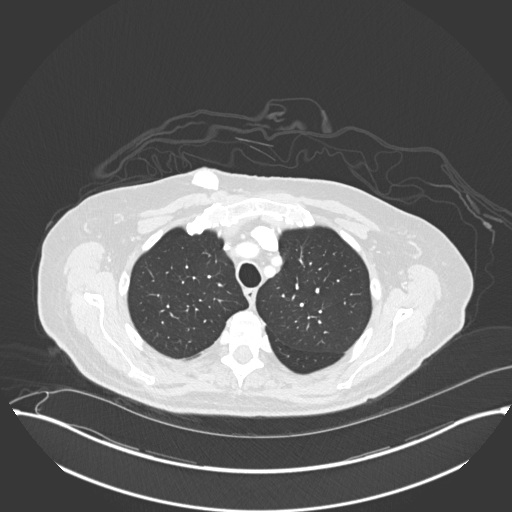
[im 149/174  lung]
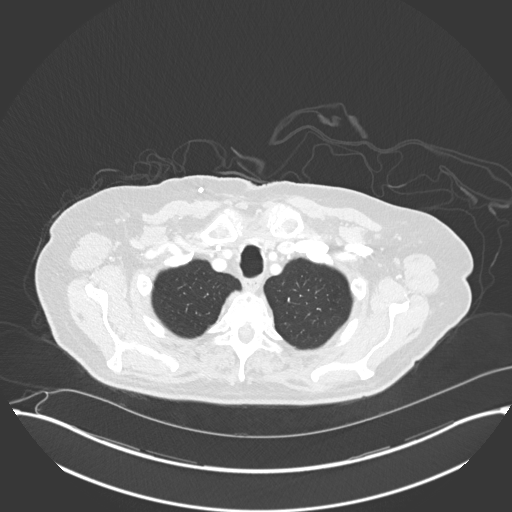
[im 161/174  lung]
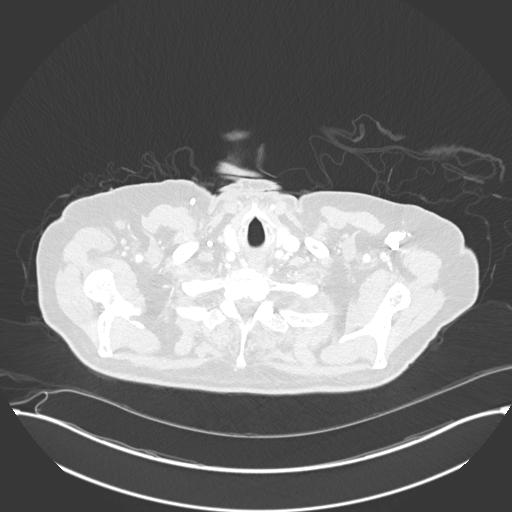

[Series 5: coronal · coronal · 0.75mm/px · 3 of 126 slices shown]
[im 26/126  lung]
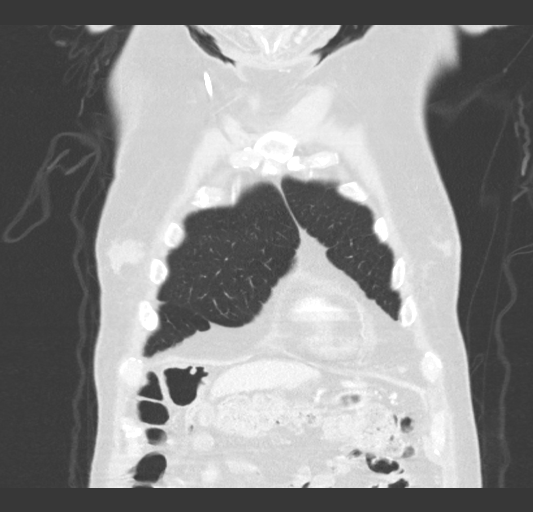
[im 51/126  lung]
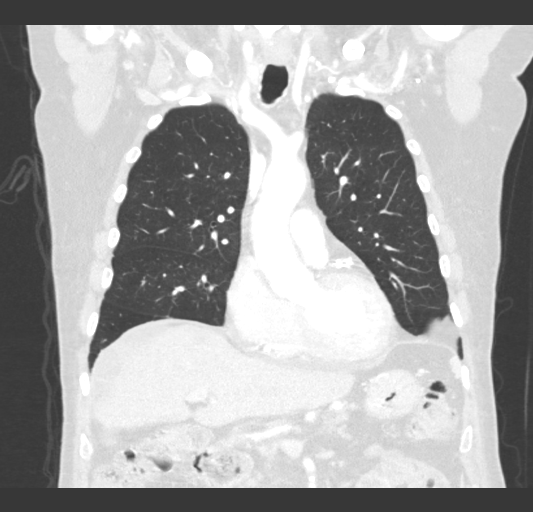
[im 76/126  lung]
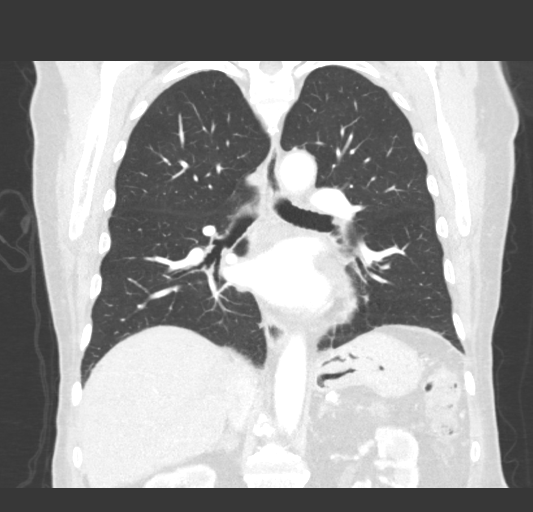

[15 of 36 positions shown; findings below may reference images not displayed]

FINDINGS: Cardiovascular: Normal heart size. Stable small pericardial
effusion/thickening. Three-vessel coronary atherosclerosis. Right
internal jugular MediPort terminates in the lower third of the SVC.
Atherosclerotic nonaneurysmal thoracic aorta. Normal caliber
pulmonary arteries. No central pulmonary emboli.

Mediastinum/Nodes: No discrete thyroid nodules. Unremarkable
esophagus. No pathologically enlarged axillary, mediastinal or hilar
lymph nodes.

Lungs/Pleura: No pneumothorax. No pleural effusion. Mild
centrilobular emphysema. Posterior right lower lobe 5 mm solid
pulmonary nodule (series 7/image 81), stable since 01/14/2018 using
similar measurement technique. No acute consolidative airspace
disease, lung masses or new significant pulmonary nodules. Stable
curvilinear parenchymal band in the right middle lobe.

Upper abdomen: Cholecystectomy. Simple 1.4 cm partially visualized
renal cyst in the posterior upper right kidney. Multiple stable
splenules in the splenectomy bed in the left upper quadrant.
Interval stability of 2.9 x 2.4 cm masslike soft tissue focus in
junction of the pancreatic body and tail with associated atrophy of
the pancreatic tail (series 2/image 149), which is new since
02/28/2016 CT abdomen/pelvis.

Musculoskeletal: No aggressive appearing focal osseous lesions.
Moderate gynecomastia, asymmetric to the right, stable. Moderate
thoracic spondylosis.
IMPRESSION: 1. Masslike 2.9 cm soft tissue focus at the junction of the
pancreatic body and tail, with associated pancreatic tail atrophy.
Although stable in the interval, this finding is new since 5711 CT
abdomen study, and primary pancreatic adenocarcinoma cannot be
excluded. MRI abdomen without and with IV contrast is recommended
for further characterization.
2. No findings suspicious for local tumor recurrence in the right
hemithorax or metastatic disease in the chest. Solitary 5 mm right
lower lobe pulmonary nodule is stable. Continued chest CT
surveillance is warranted.
3. Three-vessel coronary atherosclerosis.

Aortic Atherosclerosis (DAWC0-QSC.C) and Emphysema ([id number].R).

## 2020-04-06 IMAGING — DX DG LUMBAR SPINE 2-3V
2 series · 2 of 2 positions shown · non-contrast
Comparison: Lumbar spine CT dated 04/24/2018.

CLINICAL DATA: Preop lumbar spine.  Low back pain.

EXAM:
LUMBAR SPINE - 2-3 VIEW

[l-spine ap]
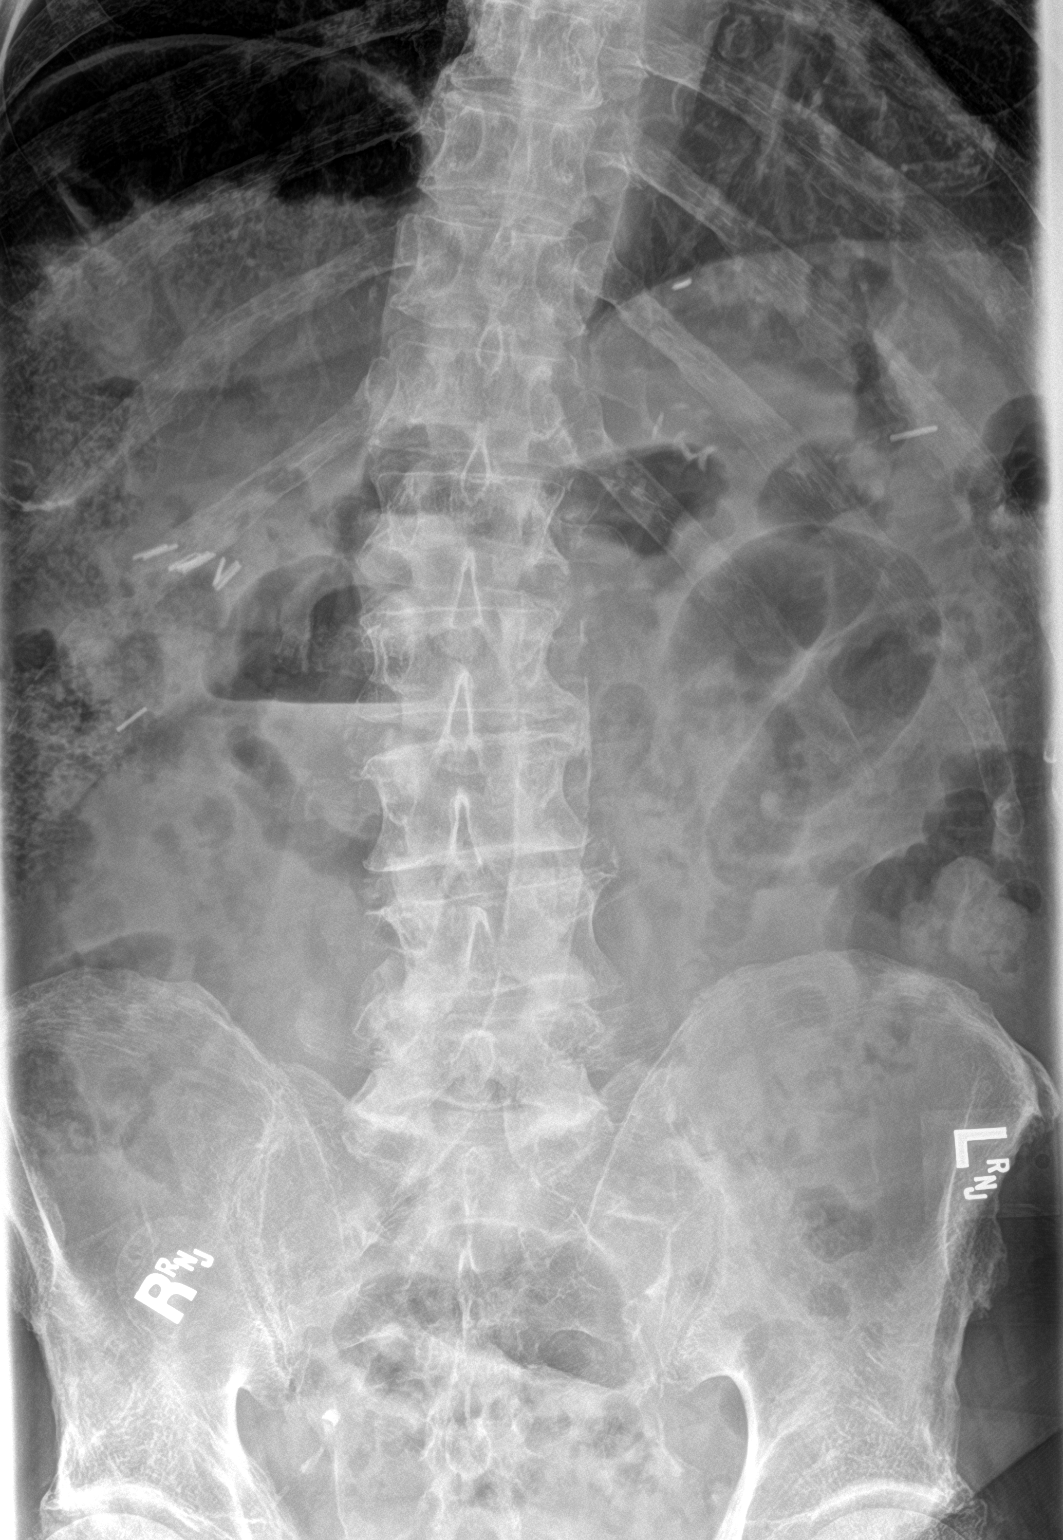

[l-spine lat]
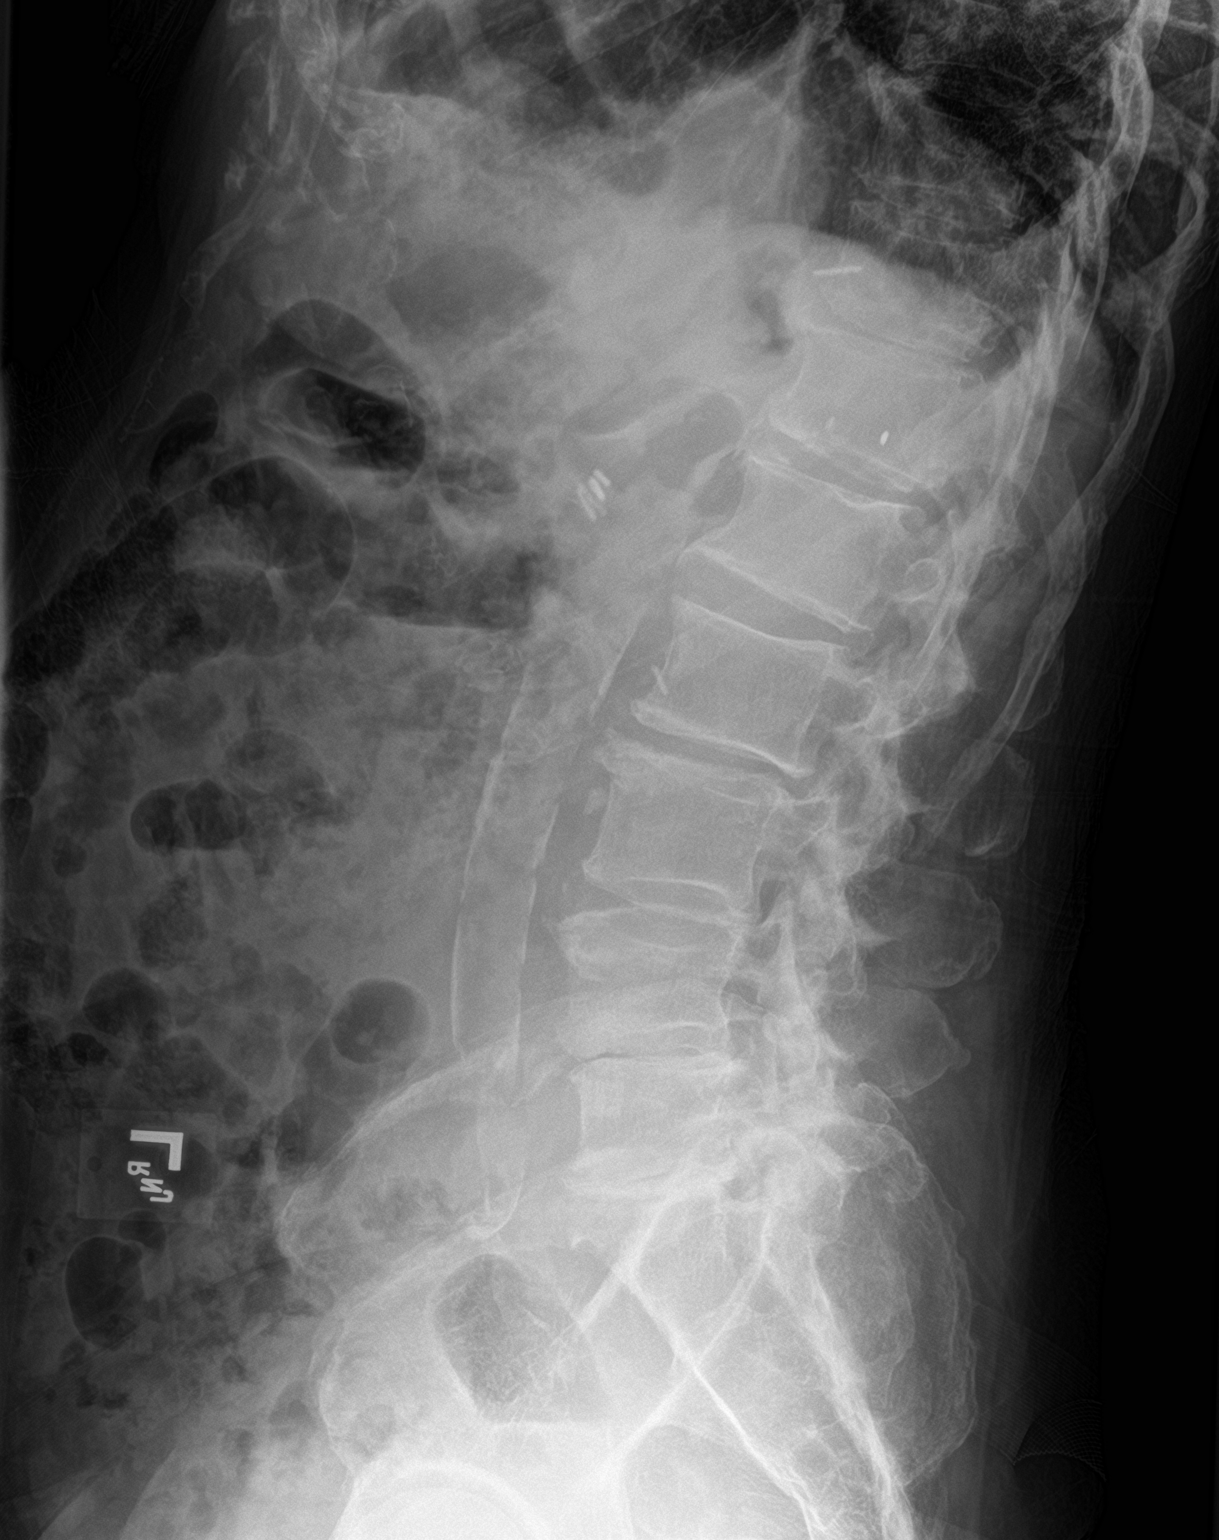

[2 of 2 positions shown; findings below may reference images not displayed]

FINDINGS: Based on lumbar spine CT of 04/24/2018, there are 5 lumbar type
vertebral bodies. Degenerative changes again noted within the lower
lumbar spine, at least moderate in degree with associated disc space
narrowings, endplate sclerosis, osseous spurring and degenerative
facet hypertrophy. Alignment is stable.

Atherosclerotic changes are again seen along the walls of the
infrarenal abdominal aorta. Cholecystectomy clips in the RIGHT upper
quadrant. Visualized paravertebral soft tissues are otherwise
unremarkable.
IMPRESSION: 1. Five lumbar type vertebral bodies. Vertebral bodies numbered on
the AP and lateral images per request.
2. Degenerative spondylosis of the lower lumbar spine, stable
compared to recent lumbar spine CT of 04/24/2018.
[DATE].  Aortic Atherosclerosis ([id number]).
